# Patient Record
Sex: Female | Born: 1990 | Race: White | Hispanic: No | Marital: Single | Smoking: Current some day smoker
Health system: Southern US, Community
[De-identification: ages and names within clinical notes are randomized; demographics above are authoritative.]

## PROBLEM LIST (undated history)

## (undated) DIAGNOSIS — F209 Schizophrenia, unspecified: Secondary | ICD-10-CM

## (undated) DIAGNOSIS — F1910 Other psychoactive substance abuse, uncomplicated: Secondary | ICD-10-CM

## (undated) DIAGNOSIS — E039 Hypothyroidism, unspecified: Secondary | ICD-10-CM

---
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
## 2011-02-13 LAB — CBC
MCHC: 34.6 g/dL (ref 31.0–37.0)
MCV: 91.9 fL (ref 78.0–98.0)
Platelets: 224 10*3/uL (ref 150–400)
RBC: 4.35 MIL/uL (ref 3.80–5.70)
RDW: 12.8 % (ref 11.4–15.5)

## 2011-02-13 LAB — URINALYSIS, MICROSCOPIC ONLY
Nitrite: NEGATIVE
Protein, ur: NEGATIVE mg/dL
Urobilinogen, UA: 1 mg/dL (ref 0.0–1.0)

## 2011-02-13 LAB — DIFFERENTIAL
Eosinophils Relative: 3 % (ref 0–5)
Lymphocytes Relative: 26 % (ref 24–48)
Lymphs Abs: 2 10*3/uL (ref 1.1–4.8)

## 2011-02-13 LAB — PREGNANCY, URINE: Preg Test, Ur: NEGATIVE

## 2011-02-13 LAB — COMPREHENSIVE METABOLIC PANEL
AST: 23 U/L (ref 0–37)
CO2: 27 mEq/L (ref 19–32)
Calcium: 9.4 mg/dL (ref 8.4–10.5)
Creatinine, Ser: 0.68 mg/dL (ref 0.4–1.2)

## 2011-02-14 LAB — COMPREHENSIVE METABOLIC PANEL
ALT: 15 U/L (ref 0–35)
AST: 26 U/L (ref 0–37)
Albumin: 5.4 g/dL — ABNORMAL HIGH (ref 3.5–5.2)
Alkaline Phosphatase: 89 U/L (ref 47–119)
Glucose, Bld: 107 mg/dL — ABNORMAL HIGH (ref 70–99)
Potassium: 5.3 mEq/L — ABNORMAL HIGH (ref 3.5–5.1)
Sodium: 140 mEq/L (ref 135–145)
Total Protein: 9.4 g/dL — ABNORMAL HIGH (ref 6.0–8.3)

## 2011-02-14 LAB — GC/CHLAMYDIA PROBE AMP, URINE
Chlamydia, Swab/Urine, PCR: NEGATIVE
GC Probe Amp, Urine: NEGATIVE
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
## 2018-04-01 DIAGNOSIS — F1120 Opioid dependence, uncomplicated: Secondary | ICD-10-CM | POA: Insufficient documentation

## 2018-04-01 DIAGNOSIS — F23 Brief psychotic disorder: Secondary | ICD-10-CM | POA: Insufficient documentation

## 2018-04-01 DIAGNOSIS — F1220 Cannabis dependence, uncomplicated: Secondary | ICD-10-CM | POA: Diagnosis present

## 2018-04-01 DIAGNOSIS — F17210 Nicotine dependence, cigarettes, uncomplicated: Secondary | ICD-10-CM | POA: Diagnosis present

## 2018-04-01 DIAGNOSIS — F1320 Sedative, hypnotic or anxiolytic dependence, uncomplicated: Secondary | ICD-10-CM | POA: Insufficient documentation

## 2020-01-09 DIAGNOSIS — S064X0A Epidural hemorrhage without loss of consciousness, initial encounter: Secondary | ICD-10-CM | POA: Insufficient documentation

## 2020-01-09 DIAGNOSIS — S064XAA Epidural hemorrhage with loss of consciousness status unknown, initial encounter: Secondary | ICD-10-CM | POA: Insufficient documentation

## 2020-01-09 DIAGNOSIS — S0219XA Other fracture of base of skull, initial encounter for closed fracture: Secondary | ICD-10-CM | POA: Insufficient documentation

## 2020-01-09 DIAGNOSIS — IMO0002 Reserved for concepts with insufficient information to code with codable children: Secondary | ICD-10-CM | POA: Insufficient documentation

## 2020-12-30 DIAGNOSIS — F1520 Other stimulant dependence, uncomplicated: Secondary | ICD-10-CM | POA: Insufficient documentation

## 2021-01-01 DIAGNOSIS — R748 Abnormal levels of other serum enzymes: Secondary | ICD-10-CM | POA: Insufficient documentation

## 2021-04-16 ENCOUNTER — Emergency Department (HOSPITAL_COMMUNITY)
Admission: EM | Admit: 2021-04-16 | Discharge: 2021-04-16 | Disposition: A | Discharge status: Home / Self Care | Payer: Medicaid Other | Attending: Emergency Medicine | Admitting: Emergency Medicine

## 2021-04-16 ENCOUNTER — Emergency Department (HOSPITAL_COMMUNITY): Payer: Medicaid Other

## 2021-04-16 ENCOUNTER — Other Ambulatory Visit: Payer: Self-pay

## 2021-04-16 ENCOUNTER — Encounter (HOSPITAL_COMMUNITY): Payer: Self-pay | Admitting: Emergency Medicine

## 2021-04-16 DIAGNOSIS — S91104A Unspecified open wound of right lesser toe(s) without damage to nail, initial encounter: Secondary | ICD-10-CM | POA: Insufficient documentation

## 2021-04-16 DIAGNOSIS — W228XXA Striking against or struck by other objects, initial encounter: Secondary | ICD-10-CM | POA: Insufficient documentation

## 2021-04-16 DIAGNOSIS — S91109A Unspecified open wound of unspecified toe(s) without damage to nail, initial encounter: Secondary | ICD-10-CM

## 2021-04-16 MED ORDER — DOXYCYCLINE HYCLATE 100 MG PO TABS: 100.0000 mg | ORAL_TABLET | Freq: Two times a day (BID) | ORAL | 0 refills | Status: AC

## 2021-04-16 NOTE — ED Provider Notes (Signed)
[facility] EMERGENCY DEPARTMENT Provider Note   CSN: [id number] Arrival date & time: 04/16/21  1638     History Chief Complaint  Patient presents with   Toe Pain    Barbara Gordon is a 30 y.o. female.   Toe Pain   Patient presented to the ED for evaluation of a toe wound.  Patient states she scraped her toe on something about a week ago.  It was the small toe on her right foot.  Patient states since that time she has noticed some increased pain and drainage.  She has been applying antibiotic ointment and peroxide to the wound.  She denies any fevers or chills.  History reviewed. No pertinent past medical history.  There are no problems to display for this patient.   History reviewed. No pertinent surgical history.   OB History   No obstetric history on file.     History reviewed. No pertinent family history.     Home Medications Prior to Admission medications   Medication Sig Start Date End Date Taking? Authorizing Provider  doxycycline (VIBRA-TABS) 100 MG tablet Take 1 tablet (100 mg total) by mouth 2 (two) times daily for 7 days. 04/16/21 04/23/21 Yes Linwood Dibbles, MD    Allergies    Patient has no known allergies.  Review of Systems   Review of Systems  All other systems reviewed and are negative.  Physical Exam Updated Vital Signs BP (!) 136/95   Pulse 97   Temp 98.6 F (37 C) (Oral)   Resp 16   Ht 1.549 m (5\' 1" )   Wt 47.6 kg   LMP 04/10/2021   SpO2 99%   BMI 19.84 kg/m   Physical Exam Vitals and nursing note reviewed.  Constitutional:      General: She is not in acute distress.    Appearance: She is well-developed.  HENT:     Head: Normocephalic and atraumatic.     Right Ear: External ear normal.     Left Ear: External ear normal.  Eyes:     General: No scleral icterus.       Right eye: No discharge.        Left eye: No discharge.     Conjunctiva/sclera: Conjunctivae normal.  Neck:     Trachea: No tracheal deviation.   Cardiovascular:     Rate and Rhythm: Normal rate.  Pulmonary:     Effort: Pulmonary effort is normal. No respiratory distress.     Breath sounds: No stridor.  Abdominal:     General: There is no distension.  Musculoskeletal:        General: No swelling or deformity.     Cervical back: Neck supple.  Skin:    General: Skin is warm and dry.     Findings: Erythema present. No rash.     Comments: Abrasion type wound noted to the right fifth toe, mild erythema, no purulent drainage, no lymphangitic streaking, tenderness palpation around the wound, no ecchymoses  Neurological:     Mental Status: She is alert.     Cranial Nerves: Cranial nerve deficit: no gross deficits.    ED Results / Procedures / Treatments   Labs (all labs ordered are listed, but only abnormal results are displayed) Labs Reviewed - No data to display  EKG None  Radiology No results found.  Procedures Procedures   Medications Ordered in ED Medications - No data to display  ED Course  I have reviewed the triage vital signs  and the nursing notes.  Pertinent labs & imaging results that were available during my care of the patient were reviewed by me and considered in my medical decision making (see chart for details).    MDM Rules/Calculators/A&P                          Patient does have a wound on her right fifth toe.  She does not have any lymphangitic streaking but does have tenderness around the wound.  We will go ahead and start her on a course of antibiotics for possible early cellulitis associated with her toe injury.  No ecchymoses or deformity to suggest fracture. Final Clinical Impression(s) / ED Diagnoses Final diagnoses:  Open toe wound, initial encounter    Rx / DC Orders ED Discharge Orders          Ordered    doxycycline (VIBRA-TABS) 100 MG tablet  2 times daily        04/16/21 Carlyn Reichert, MD 04/16/21 1801

## 2021-04-16 NOTE — ED Triage Notes (Signed)
Pt arrives POV for eval of R pinky toe pain s/p striking it 1 week ago. Small sore present, concerned for infection. No streaking noted

## 2021-04-16 NOTE — Discharge Instructions (Addendum)
Continue to apply antibiotic ointment to the wound.  Take the antibiotics as prescribed.  Take over-the-counter as needed for pain

## 2021-04-16 NOTE — ED Provider Notes (Signed)
Emergency Medicine Provider Triage Evaluation Note  Barbara Gordon , a 30 y.o. female  was evaluated in triage. Pt complains of pain to right 5th toe. Hit one week ago. Now with wound to toe. Feel like it is swollen and possible infected. No hx of DM.  Review of Systems  Positive: Right 5th toe pain, redness Negative: Fever, chills  Physical Exam  There were no vitals taken for this visit. Gen:   Awake, no distress   Resp:  Normal effort  MSK:   Moves extremities without difficult, diffuse tenderness to pinky toe on right, wound to toe without drainage Other:    Medical Decision Making  Medically screening exam initiated at 4:50 PM.  Appropriate orders placed.  Yemariam Sawchuk was informed that the remainder of the evaluation will be completed by another provider, this initial triage assessment does not replace that evaluation, and the importance of remaining in the ED until their evaluation is complete.  5th digit pain and wound   Jaquae Rieves A, PA-C 04/16/21 1653    Linwood Dibbles, MD 04/16/21 2324

## 2021-04-27 DIAGNOSIS — F15922 Other stimulant use, unspecified with intoxication with perceptual disturbance: Secondary | ICD-10-CM | POA: Insufficient documentation

## 2021-04-27 DIAGNOSIS — F1994 Other psychoactive substance use, unspecified with psychoactive substance-induced mood disorder: Secondary | ICD-10-CM | POA: Insufficient documentation

## 2021-04-28 DIAGNOSIS — F1420 Cocaine dependence, uncomplicated: Secondary | ICD-10-CM | POA: Insufficient documentation

## 2021-04-29 DIAGNOSIS — Z765 Malingerer [conscious simulation]: Secondary | ICD-10-CM | POA: Insufficient documentation

## 2021-05-22 ENCOUNTER — Emergency Department (HOSPITAL_COMMUNITY)
Admission: EM | Admit: 2021-05-22 | Discharge: 2021-05-24 | Disposition: A | Discharge status: Home / Self Care | Payer: Self-pay | Attending: Emergency Medicine | Admitting: Emergency Medicine

## 2021-05-22 ENCOUNTER — Encounter (HOSPITAL_COMMUNITY): Payer: Self-pay | Admitting: Emergency Medicine

## 2021-05-22 ENCOUNTER — Emergency Department (HOSPITAL_COMMUNITY): Payer: Self-pay

## 2021-05-22 ENCOUNTER — Other Ambulatory Visit: Payer: Self-pay

## 2021-05-22 DIAGNOSIS — F1999 Other psychoactive substance use, unspecified with unspecified psychoactive substance-induced disorder: Secondary | ICD-10-CM | POA: Diagnosis present

## 2021-05-22 DIAGNOSIS — R4182 Altered mental status, unspecified: Secondary | ICD-10-CM | POA: Insufficient documentation

## 2021-05-22 DIAGNOSIS — Y900 Blood alcohol level of less than 20 mg/100 ml: Secondary | ICD-10-CM | POA: Insufficient documentation

## 2021-05-22 DIAGNOSIS — F1924 Other psychoactive substance dependence with psychoactive substance-induced mood disorder: Secondary | ICD-10-CM | POA: Insufficient documentation

## 2021-05-22 DIAGNOSIS — F1910 Other psychoactive substance abuse, uncomplicated: Secondary | ICD-10-CM | POA: Diagnosis present

## 2021-05-22 DIAGNOSIS — U071 COVID-19: Secondary | ICD-10-CM | POA: Insufficient documentation

## 2021-05-22 LAB — CBC WITH DIFFERENTIAL/PLATELET
Abs Immature Granulocytes: 0.01 10*3/uL (ref 0.00–0.07)
Basophils Absolute: 0 10*3/uL (ref 0.0–0.1)
Basophils Relative: 0 %
Eosinophils Absolute: 0 10*3/uL (ref 0.0–0.5)
Eosinophils Relative: 0 %
HCT: 41.9 % (ref 36.0–46.0)
Hemoglobin: 14.5 g/dL (ref 12.0–15.0)
Immature Granulocytes: 0 %
Lymphocytes Relative: 37 %
Lymphs Abs: 1.3 10*3/uL (ref 0.7–4.0)
MCH: 31.8 pg (ref 26.0–34.0)
MCHC: 34.6 g/dL (ref 30.0–36.0)
MCV: 91.9 fL (ref 80.0–100.0)
Monocytes Absolute: 0.4 10*3/uL (ref 0.1–1.0)
Monocytes Relative: 11 %
Neutro Abs: 1.8 10*3/uL (ref 1.7–7.7)
Neutrophils Relative %: 52 %
Platelets: 227 10*3/uL (ref 150–400)
RBC: 4.56 MIL/uL (ref 3.87–5.11)
RDW: 12.7 % (ref 11.5–15.5)
WBC: 3.5 10*3/uL — ABNORMAL LOW (ref 4.0–10.5)
nRBC: 0 % (ref 0.0–0.2)

## 2021-05-22 LAB — COMPREHENSIVE METABOLIC PANEL
ALT: 30 U/L (ref 0–44)
AST: 39 U/L (ref 15–41)
Albumin: 4.5 g/dL (ref 3.5–5.0)
Alkaline Phosphatase: 50 U/L (ref 38–126)
Anion gap: 9 (ref 5–15)
BUN: 16 mg/dL (ref 6–20)
CO2: 27 mmol/L (ref 22–32)
Calcium: 9.8 mg/dL (ref 8.9–10.3)
Chloride: 99 mmol/L (ref 98–111)
Creatinine, Ser: 0.86 mg/dL (ref 0.44–1.00)
GFR, Estimated: >60 mL/min (ref >60)
Glucose, Bld: 120 mg/dL — ABNORMAL HIGH (ref 70–99)
Potassium: 4.1 mmol/L (ref 3.5–5.1)
Sodium: 135 mmol/L (ref 135–145)
Total Bilirubin: 0.7 mg/dL (ref 0.3–1.2)
Total Protein: 8.3 g/dL — ABNORMAL HIGH (ref 6.5–8.1)

## 2021-05-22 LAB — ETHANOL: Alcohol, Ethyl (B): <10 mg/dL (ref <10)

## 2021-05-22 LAB — RAPID URINE DRUG SCREEN, HOSP PERFORMED
Amphetamines: NOT DETECTED
Barbiturates: NOT DETECTED
Benzodiazepines: NOT DETECTED
Cocaine: NOT DETECTED
Opiates: NOT DETECTED
Tetrahydrocannabinol: POSITIVE — AB

## 2021-05-22 LAB — RESP PANEL BY RT-PCR (FLU A&B, COVID) ARPGX2
Influenza A by PCR: NEGATIVE
Influenza B by PCR: NEGATIVE
SARS Coronavirus 2 by RT PCR: POSITIVE — AB

## 2021-05-22 LAB — PREGNANCY, URINE: Preg Test, Ur: NEGATIVE

## 2021-05-22 LAB — ACETAMINOPHEN LEVEL: Acetaminophen (Tylenol), Serum: <10 ug/mL — ABNORMAL LOW (ref 10–30)

## 2021-05-22 LAB — SALICYLATE LEVEL: Salicylate Lvl: <7 mg/dL — ABNORMAL LOW (ref 7.0–30.0)

## 2021-05-22 MED ORDER — ACETAMINOPHEN 325 MG PO TABS: 650.0000 mg | ORAL_TABLET | ORAL | Status: DC | PRN

## 2021-05-22 MED ORDER — NICOTINE 21 MG/24HR TD PT24
21.0000 mg | MEDICATED_PATCH | Freq: Every day | TRANSDERMAL | Status: DC
  Administered 2021-05-24: 21 mg via TRANSDERMAL
  Filled 2021-05-22: qty 1

## 2021-05-22 NOTE — ED Notes (Addendum)
Pt opening door of her room, repeatedly. She doesn't speak when she is at the doorway but stares. Affect is flat. Pt does not shut the door when asked, and staff have to shut the door. NT and I encouraged pt to stay in the room. I explained to the patient where she is located, that she is not herself, we want to help her get well, and she is positive for COVID-19. She expressed understanding by nodding her head yes. While speaking with the pt, she was looking around the room with a blank stare. She requested dinner, so I gave her a [place] sandwich to eat until dinner arrives. Pt is redirectable.

## 2021-05-22 NOTE — ED Triage Notes (Signed)
Patient was brought in via EMS previously for altered mental status. Patient seems hesitant when answering questions.  Reports "doesn't understand exactly why she is here". Patient denies drug use to the PA at the bedside.  Patient denies nausea, vomiting or any type of pain.

## 2021-05-22 NOTE — ED Notes (Signed)
Patient changed into burgundy scrubs, belongings labeled and placed into cabinet at 9-12 nurse's station. Pt belongings include: black sandals, gray t-shirt, pink shorts, pink bag, folder, pink lighter, black cell phone, and blue and white wallet. Witnessed by [employer].

## 2021-05-22 NOTE — ED Provider Notes (Signed)
[place] DEPT Provider Note   CSN: [id number] Arrival date & time: 05/22/21  1035     History Chief Complaint  Patient presents with   Altered Mental Status    Barbara Gordon is a 30 y.o. female with a past medical history significant for amphetamine abuse, cocaine use, anxiety, polysubstance abuse, chronic homelessness, and borderline personality who presents to the ED via EMS under IVC by [employer] due to confusion. During initial evaluation, patient is unsure why she is here; however she is alert and oriented. Per [employer], they picked patient up at Larry's house who is a 29 year old female who patient met with on facebook and is now living with him. Per [employer], patient is unable to return to [employer]. Patient denies any recent drug or alcohol use. Denies SI, HI, and auditory/visual hallucinations. Chart reviewed. Patient was evaluated at [facility] ED on 7/12 for similar situation where patient was found wandering the roadway. Patient admits to tobacco use. Denies any physical complaints.  Denies nausea, vomiting, diarrhea, headache, chest pain, abdominal pain, and shortness of breath.  History obtained from patient and past medical records. No interpreter used during encounter.      History reviewed. No pertinent past medical history.  There are no problems to display for this patient.   History reviewed. No pertinent surgical history.   OB History   No obstetric history on file.     History reviewed. No pertinent family history.     Home Medications Prior to Admission medications   Not on File    Allergies    Patient has no known allergies.  Review of Systems   Review of Systems  Constitutional:  Negative for chills and fever.  HENT:  Negative for rhinorrhea and sore throat.   Eyes:  Negative for visual disturbance.  Respiratory:  Negative for shortness of breath.   Cardiovascular:  Negative for chest pain and palpitations.  Gastrointestinal:   Negative for abdominal pain.  Genitourinary:  Negative for dysuria.  Musculoskeletal:  Negative for myalgias.  Skin:  Negative for color change and rash.  Neurological:  Negative for dizziness and light-headedness.  Psychiatric/Behavioral:  Positive for confusion. Negative for suicidal ideas.    Physical Exam Updated Vital Signs BP (!) 142/95 (BP Location: Left Arm)   Pulse 84   Temp 97.7 F (36.5 C) (Oral)   Resp 20   Ht $R'5\' 1"'qO$  (1.549 m)   Wt 41 kg   SpO2 100%   BMI 17.06 kg/m   Physical Exam Vitals and nursing note reviewed.  Constitutional:      General: She is not in acute distress.    Appearance: She is not ill-appearing.  HENT:     Head: Normocephalic.  Eyes:     Pupils: Pupils are equal, round, and reactive to light.  Cardiovascular:     Rate and Rhythm: Normal rate and regular rhythm.     Pulses: Normal pulses.     Heart sounds: Normal heart sounds. No murmur heard.   No friction rub. No gallop.  Pulmonary:     Effort: Pulmonary effort is normal.     Breath sounds: Normal breath sounds.  Abdominal:     General: Abdomen is flat. There is no distension.     Palpations: Abdomen is soft.     Tenderness: There is no abdominal tenderness. There is no guarding or rebound.  Musculoskeletal:        General: Normal range of motion.  Cervical back: Neck supple.  Skin:    General: Skin is warm and dry.  Neurological:     General: No focal deficit present.     Mental Status: She is alert and oriented to person, place, and time.     Comments: AAOx4.  Speech is clear, able to follow commands, but slow to respond CN III-XII intact Normal strength in upper and lower extremities bilaterally including dorsiflexion and plantar flexion, strong and equal grip strength Sensation grossly intact throughout Moves extremities without ataxia, coordination intact No pronator drift Ambulates without difficulty   Psychiatric:        Mood and Affect: Mood normal.         Behavior: Behavior normal.    ED Results / Procedures / Treatments   Labs (all labs ordered are listed, but only abnormal results are displayed) Labs Reviewed  RESP PANEL BY RT-PCR (FLU A&B, COVID) ARPGX2 - Abnormal; Notable for the following components:      Result Value   SARS Coronavirus 2 by RT PCR POSITIVE (*)    All other components within normal limits  COMPREHENSIVE METABOLIC PANEL - Abnormal; Notable for the following components:   Glucose, Bld 120 (*)    Total Protein 8.3 (*)    All other components within normal limits  RAPID URINE DRUG SCREEN, HOSP PERFORMED - Abnormal; Notable for the following components:   Tetrahydrocannabinol POSITIVE (*)    All other components within normal limits  CBC WITH DIFFERENTIAL/PLATELET - Abnormal; Notable for the following components:   WBC 3.5 (*)    All other components within normal limits  ACETAMINOPHEN LEVEL - Abnormal; Notable for the following components:   Acetaminophen (Tylenol), Serum <10 (*)    All other components within normal limits  SALICYLATE LEVEL - Abnormal; Notable for the following components:   Salicylate Lvl <7.0 (*)    All other components within normal limits  ETHANOL  PREGNANCY, URINE  I-STAT BETA HCG BLOOD, ED ([facility], [facility], [facility] ONLY)    EKG EKG Interpretation  Date/Time:  Friday May 22 2021 13:15:28 EDT Ventricular Rate:  86 PR Interval:  138 QRS Duration: 80 QT Interval:  354 QTC Calculation: 423 R Axis:   92 Text Interpretation: Normal sinus rhythm Rightward axis Borderline ECG Confirmed by Lacretia Leigh ([id number]) on 05/22/2021 2:17:09 PM  Radiology No results found.  Procedures Procedures   Medications Ordered in ED Medications  acetaminophen (TYLENOL) tablet 650 mg (has no administration in time range)  nicotine (NICODERM CQ - dosed in mg/24 hours) patch 21 mg (21 mg Transdermal Patient Refused/Not Given 05/22/21 2128)    ED Course  I have reviewed the triage vital signs and the nursing  notes.  Pertinent labs & imaging results that were available during my care of the patient were reviewed by me and considered in my medical decision making (see chart for details).  Clinical Course as of 05/22/21 2158  Fri May 22, 2021  1315 Tetrahydrocannabinol(!): POSITIVE [CA]  1501 SARS Coronavirus 2 by RT PCR(!): POSITIVE [CA]    Clinical Course User Index [CA] Karie Kirks   MDM Rules/Calculators/A&P                         30 year old female presents to the ED via EMS under IVC due to AMS. Patient seen at [facility] ED on 7/12 for the same. Patient has a history of polysubstance abuse. Denies SI, HI, and auditory/visual hallucinations.  Upon arrival, stable  vitals.  Patient in no acute distress.  Patient is alert and oriented however, she is slow to respond to questions.  Very difficult to obtain HPI.  [employer] at bedside notes that patient has been living with a 44 year old that she met on facebook a few months ago. Normal neurological exam. First examination performed. Medical clearance labs ordered.  CBC significant for mild leukopenia at 3.5, but otherwise unremarkable.  Normal hemoglobin.  Ethanol, acetaminophen, salicylate level normal.  CMP significant for hyperglycemia 120.  No anion gap.  Normal renal function.  No major electrolyte derangements.  UDS positive for THC.  Patient has been medically cleared for TTS evaluation.  The patient has been placed in psychiatric observation due to the need to provide a safe environment for the patient while obtaining psychiatric consultation and evaluation, as well as ongoing medical and medication management to treat the patient's condition.  The patient has been placed under full IVC at this time.  Final Clinical Impression(s) / ED Diagnoses Final diagnoses:  Altered mental status, unspecified altered mental status type    Rx / DC Orders ED Discharge Orders     None        Karie Kirks 05/22/21 2158     Lacretia Leigh, MD 05/23/21 [phone]

## 2021-05-22 NOTE — ED Notes (Signed)
Report given to TU nurse and patient is ambulated to the unit without problems

## 2021-05-22 NOTE — ED Notes (Signed)
Pt wandering around in triage, pt directed back to triage 4 multiple times, pt states she wants to leave but does not know how.

## 2021-05-22 NOTE — ED Notes (Addendum)
Patient was IVC'd by [facility] Dept.   patient had been wandering around outside. [facility] dept tried to get patient to go home or come inside to the ED earlier after she left triage, but patient would not.

## 2021-05-22 NOTE — BH Assessment (Addendum)
Comprehensive Clinical Assessment (CCA) Note  05/22/2021 Barbara Gordon [id number] DISPOSITION: Leevy-Johnson NP recommends a inpatient admission to assist with stabilization.       [place] ED from 05/22/2021 in [place] DEPT ED from 04/16/2021 in [place] No Risk No Risk      The patient demonstrates the following risk factors for suicide: Chronic risk factors for suicide include: N/A. Acute risk factors for suicide include: N/A. Protective factors for this patient include: positive social support. Considering these factors, the overall suicide risk at this point appears to be low. Patient is not appropriate for outpatient follow up.   Patient is a 30 year old female that presents with AMS. Patient was with IVC that was initiated by [facility] stating respondent has no ideal where she is, who she is or why she is presenting this date. Patient has a past history per notes of amphetamine, cocaine and THC use with UDS positive for THC this date. Patient's history is limited per chart review. Patient denies any S/I, H/I or AVH this date although this writer is uncertain if patient is comprehending the content of this writer's questions. LEO who was present stated someone in the community contacted them to respond to a residence (LEO was uncertain if patient resided there) and due to being impaired was [facility] and brought in for evaluation. History is limited per chart review. Patient does answer "no, no" to questions in reference to S/I, H/I or AVH. Patient is observed to just be staring at this writer and will not respond to questions other than above. Information to complete assessment was obtained from admission notes and chart review.  Aberman PA writes on arrival:  Barbara Gordon is a 30 y.o. female with a past medical history significant for amphetamine abuse, cocaine use, anxiety, polysubstance abuse, chronic  homelessness, and borderline personality who presents to the ED via EMS under IVC by [employer] due to confusion. During initial evaluation, patient is unsure why she is here; however she is alert and oriented. Per [employer], they picked patient up at Larry's house who is a 30 year old female who patient met with on facebook and is now living with him. Per [employer], patient is unable to return to [employer]. Patient denies any recent drug or alcohol use. Denies SI, HI, and auditory/visual hallucinations. Chart reviewed. Patient was evaluated at [facility] ED on 7/12 for similar situation where patient was found wandering the roadway. Patient admits to tobacco use. Denies any physical complaints.  Denies nausea, vomiting, diarrhea, headache, chest pain, abdominal pain, and shortness of breath.  Patient will not respond to orientation questions. Patient presents as bizarre and will not participate in the assessment process. Patient's memory is impaired with thoughts disorganized. It is unclear if patient is responding to internal stimuli.      Chief Complaint:  Chief Complaint  Patient presents with   Altered Mental Status   Visit Diagnosis: Altered mental status    CCA Screening, Triage and Referral (STR)  Patient Reported Information How did you hear about [name]? -- (LEO brought patient in)  What Is the Reason for Your Visit/Call Today? IVC altered mental state  How Long Has This Been Causing You Problems? <Week  What Do You Feel Would Help You the Most Today? -- (UTA)   Have You Recently Had Any Thoughts About Hurting Yourself? No  Are You Planning to Commit Suicide/Harm Yourself At This time? No  Have you Recently Had Thoughts About [place]? No data recorded Are You Planning to Harm Someone at This Time? No  Explanation: No data recorded  Have You Used Any Alcohol or Drugs in the Past 24 Hours? Yes  How Long Ago Did You Use Drugs or Alcohol? No data recorded What Did You Use and How  Much? Positive for THC per UDS   Do You Currently Have a Therapist/Psychiatrist? No  Name of Therapist/Psychiatrist: No data recorded  Have You Been Recently Discharged From Any Office Practice or Programs? No  Explanation of Discharge From Practice/Program: No data recorded    CCA Screening Triage Referral Assessment Type of Contact: Face-to-Face  Telemedicine Service Delivery:   Is this Initial or Reassessment? No data recorded Date Telepsych consult ordered in [facility]:  No data recorded Time Telepsych consult ordered in [facility]:  No data recorded Location of Assessment: [facility]  Provider Location: -- ([facility])   Collateral Involvement: None at this time   Does Patient Have a [place]? No data recorded Name and Contact of Legal Guardian: No data recorded If Minor and Not Living with Parent(s), Who has Custody? NA  Is CPS involved or ever been involved? Never  Is APS involved or ever been involved? Never   Patient Determined To Be At Risk for Harm To Self or Others Based on Review of Patient Reported Information or Presenting Complaint? No  Method: No data recorded Availability of Means: No data recorded Intent: No data recorded Notification Required: No data recorded Additional Information for Danger to Others Potential: No data recorded Additional Comments for Danger to Others Potential: No data recorded Are There Guns or Other Weapons in Your Home? No data recorded Types of Guns/Weapons: No data recorded Are These Weapons Safely Secured?                            No data recorded Who Could Verify You Are Able To Have These Secured: No data recorded Do You Have any Outstanding Charges, Pending Court Dates, Parole/Probation? No data recorded Contacted To Inform of Risk of Harm To Self or Others: Other: Comment (NA)    Does Patient Present under Involuntary Commitment? Yes  IVC Papers Initial File Date: 05/22/21   [place] of Residence:  [place]   Patient Currently Receiving the Following Services: Not Receiving Services   Determination of Need: Emergent (2 hours)   Options For Referral: Outpatient Therapy     CCA Biopsychosocial Patient Reported Schizophrenia/Schizoaffective Diagnosis in Past: No   Strengths: UTA   Mental Health Symptoms Depression:   -- (UTA)   Duration of Depressive symptoms:    Mania:   -- (UTA)   Anxiety:    -- (UTA)   Psychosis:   -- (UTA)   Duration of Psychotic symptoms:    Trauma:   -- (UTA)   Obsessions:   -- (UTA)   Compulsions:   -- (UTA)   Inattention:   -- (UTA)   Hyperactivity/Impulsivity:   -- (UTA)   Oppositional/Defiant Behaviors:   -- (UTA)   Emotional Irregularity:   -- (UTA)   Other Mood/Personality Symptoms:   UTA    Mental Status Exam Appearance and self-care  Stature:   Average   Weight:   Average weight   Clothing:   Disheveled   Grooming:   Bizarre   Cosmetic use:   None   Posture/gait:   Bizarre   Motor activity:  Agitated   Sensorium  Attention:   Confused   Concentration:   Anxiety interferes   Orientation:   -- (UTA)   Recall/memory:   -- (UTA)   Affect and Mood  Affect:   Anxious   Mood:   Anxious   Relating  Eye contact:   Fleeting   Facial expression:   Anxious   Attitude toward examiner:   Uninterested   Thought and Language  Speech flow:  Blocked   Thought content:   Suspicious   Preoccupation:   None   Hallucinations:   -- (UTA)   Organization:  No data recorded  [employer] of Knowledge:   -- Special educational needs teacher)   Intelligence:   -- Special educational needs teacher)   Abstraction:   Abstract   Judgement:   Impaired   Reality Testing:   Distorted   Insight:   -- Special educational needs teacher)   Decision Making:   Confused   Social Functioning  Social Maturity:   -- Special educational needs teacher)   Social Judgement:   -- Special educational needs teacher)   Stress  Stressors:   -- Special educational needs teacher)   Coping Ability:   -- Special educational needs teacher)   Skill Deficits:   --  Special educational needs teacher)   Supports:   -- Special educational needs teacher)     Religion: Religion/Spirituality Are You A Religious Person?:  (UTA) How Might This Affect Treatment?: UTA  Leisure/Recreation: Leisure / Recreation Do You Have Hobbies?:  (UTA)  Exercise/Diet: Exercise/Diet Do You Exercise?:  (UTA) Have You Gained or Lost A Significant Amount of Weight in the Past Six Months?:  (UTA) Do You Follow a Special Diet?:  (UTA) Do You Have Any Trouble Sleeping?:  (UTA)   CCA Employment/Education Employment/Work Situation: Employment / Work Situation Employment Situation:  Special educational needs teacher) Patient's Job has Been Impacted by Current Illness:  (UTA) Has Patient ever Been in the [employer]?:  (UTA)  Education: Education Is Patient Currently Attending School?:  (UTA) Last Grade Completed:  (UTA) Did You Attend College?:  (UTA) Did You Have An Individualized Education Program (IIEP):  (UTA) Did You Have Any Difficulty At School?:  (UTA) Patient's Education Has Been Impacted by Current Illness:  (UTA)   CCA Family/Childhood History Family and Relationship History: Family history Marital status:  (UTA) Does patient have children?:  (UTA)  Childhood History:  Childhood History By whom was/is the patient raised?:  (UTA) Did patient suffer any verbal/emotional/physical/sexual abuse as a child?:  (UTA) Did patient suffer from severe childhood neglect?:  (UTA) Has patient ever been sexually abused/assaulted/raped as an adolescent or adult?:  (UTA) Was the patient ever a victim of a crime or a disaster?:  (UTA) Witnessed domestic violence?:  (UTA) Has patient been affected by domestic violence as an adult?:  Special educational needs teacher)  Child/Adolescent Assessment:     CCA Substance Use Alcohol/Drug Use: Alcohol / Drug Use Pain Medications: See MAR Prescriptions: See MAR Over the Counter: See MAR History of alcohol / drug use?: Yes Longest period of sobriety (when/how long): UTA Negative Consequences of Use:  (UTA) Withdrawal Symptoms:   (UTA) Substance #1 Name of Substance 1: THC per UDS 1 - Age of First Use: UTA 1 - Amount (size/oz): UTA 1 - Frequency: UTA 1 - Duration: UTA 1 - Last Use / Amount: UTA 1 - Method of Aquiring: UTA 1- Route of Use: UTA                       ASAM's:  Six Dimensions of Multidimensional Assessment  Dimension 1:  Acute Intoxication and/or Withdrawal  Potential:      Dimension 2:  Biomedical Conditions and Complications:      Dimension 3:  Emotional, Behavioral, or Cognitive Conditions and Complications:     Dimension 4:  Readiness to Change:     Dimension 5:  Relapse, Continued use, or Continued Problem Potential:     Dimension 6:  Recovery/Living Environment:     ASAM Severity Score:    ASAM Recommended Level of Treatment:     Substance use Disorder (SUD)    Recommendations for Services/Supports/Treatments:    Discharge Disposition:    DSM5 Diagnoses: There are no problems to display for this patient.    Referrals to Alternative Service(s): Referred to Alternative Service(s):   Place:   Date:   Time:    Referred to Alternative Service(s):   Place:   Date:   Time:    Referred to Alternative Service(s):   Place:   Date:   Time:    Referred to Alternative Service(s):   Place:   Date:   Time:     Mamie Nick, LCAS

## 2021-05-22 NOTE — ED Triage Notes (Signed)
Per EMS- [facility] was called for a Public house manager. When they arrived the patient was confused and when female friend was near the patient the patient would not talk to EMS. Patient has a history of meth use. Patient was alert and oriented and was aware that she was going to the ED.   Patient currently wondering around in Triage and left out of the Triage area to the ED Lobby. Patient stated  she wanted to leave, but did not know how to leave. Patient opened the door and left.

## 2021-05-22 NOTE — ED Notes (Addendum)
Pt is crying in her room. She used the call bell in request dinner. Again, I explained the reason she is here and that we are trying to assist her. She wants to use her cell phone and have her things. I explained the no cell phone policy and offered her the use of the cordless phone. Pt is redirectable.

## 2021-05-22 NOTE — ED Provider Notes (Signed)
I provided a substantive portion of the care of this patient.  I personally performed the entirety of the medical decision making for this encounter.  30 year old female presents due to confusion after being brought here by [employer].  Patient denies SI or HI.  Will order labs here.  She does have a history of polysubstance abuse.  Suspect that is some of the etiology of her current symptoms.  We will follow-up   Lorre Nick, MD 05/22/21 1330

## 2021-05-23 MED ORDER — ZIPRASIDONE MESYLATE 20 MG IM SOLR: 20.0000 mg | INTRAMUSCULAR | Status: DC | PRN

## 2021-05-23 MED ORDER — RISPERIDONE 1 MG PO TBDP
1.0000 mg | ORAL_TABLET | Freq: Two times a day (BID) | ORAL | Status: DC
  Administered 2021-05-23 – 2021-05-24 (×3): 1 mg via ORAL
  Filled 2021-05-23 (×3): qty 1

## 2021-05-23 MED ORDER — LORAZEPAM 1 MG PO TABS: 1.0000 mg | ORAL_TABLET | ORAL | Status: DC | PRN

## 2021-05-23 MED ORDER — OLANZAPINE 5 MG PO TBDP: 5.0000 mg | ORAL_TABLET | Freq: Three times a day (TID) | ORAL | Status: DC | PRN

## 2021-05-23 NOTE — ED Notes (Signed)
Pt has been resting this shift, eats and takes meds with no issues. Pt is able to be redirected  when she attempts to come out of room.

## 2021-05-23 NOTE — Progress Notes (Signed)
Per Leroy Sea, patient meets criteria for inpatient treatment. There are no available or appropriate beds at [facility] today. CSW faxed referrals to the following facilities for review:  [facility] [place] [facility]  TTS will continue to seek bed placement.  Crissie Reese, MSW, LCSW-A, LCAS-A Phone: [phone] Disposition/TOC

## 2021-05-23 NOTE — BH Assessment (Signed)
This Probation officer met with patient this date to assess current mental health state. Patient contnues to be disorganized and cannot recall day, month or year. Patient did acknowledge that she is "in the hospital." Patient's recent memory is impaired and she has no recall of the events that transpired prior to arrival. Patient denies any S/I, H/I or AVH. This Probation officer attempts to gather history in reference to her SA issues and other mental health disorders although as stated above patient has no recall at this time. Patient is observed to be staring at this writer and will not respond when asked questions. Leevy-Johnson NP recommends a continued inpatient admission as placement is investigated.

## 2021-05-23 NOTE — ED Provider Notes (Signed)
Emergency Medicine Observation Re-evaluation Note  Barbara Gordon is a 30 y.o. female, seen on rounds today.  Pt initially presented to the ED for complaints of Altered Mental Status Currently, the patient is sitting in her room.  Calm and cooperative.  Physical Exam  BP 135/86 (BP Location: Left Arm)   Pulse 74   Temp 98 F (36.7 C) (Oral)   Resp 20   Ht 1.549 m (5\' 1" )   Wt 41 kg   SpO2 94%   BMI 17.06 kg/m  Physical Exam General: No acute distress Cardiac Lungs:  Psych: Appears somewhat anxious  ED Course / MDM  EKG:EKG Interpretation  Date/Time:  Friday May 22 2021 13:15:28 EDT Ventricular Rate:  86 PR Interval:  138 QRS Duration: 80 QT Interval:  354 QTC Calculation: 423 R Axis:   92 Text Interpretation: Normal sinus rhythm Rightward axis Borderline ECG Confirmed by [id number] (Lorre Nick) on 05/22/2021 2:17:09 PM  I have reviewed the labs performed to date as well as medications administered while in observation.  Recent changes in the last 24 hours include none.  Plan  Current plan is for reevaluation by psychiatry. Patient is under full IVC at this time.   05/24/2021, MD 05/23/21 1009

## 2021-05-23 NOTE — ED Notes (Signed)
Pt given apple juice. Pt declined sandwich and/or snack.

## 2021-05-23 NOTE — Consult Note (Signed)
Barbara Gordon is a 30 year old female who presented to [facility] via EMS after [facility] was called for a welfare check. Per chart review, upon arrival patient was found confused and disoriented with female friend who would not talk to EMS. Patient has past history of polysubstance abuse, substance induced disorder, borderline personality disorder. Patient presented to [facility] ED /05/19/21 with similar presentation after being found wandering in the road. UDS+ THC, BAL<10. SARS+. Currently under IVC.   Plan:   -Inpatient admission for further observation, stabilization, and  treatment   -Agitation Protocol initiated for any breakthrough agitation   -Risperidone 1 mg BID restarted for substance induced disorder

## 2021-05-24 DIAGNOSIS — F1910 Other psychoactive substance abuse, uncomplicated: Secondary | ICD-10-CM | POA: Diagnosis present

## 2021-05-24 DIAGNOSIS — F1999 Other psychoactive substance use, unspecified with unspecified psychoactive substance-induced disorder: Secondary | ICD-10-CM | POA: Diagnosis present

## 2021-05-24 NOTE — Discharge Instructions (Addendum)
Follow-up with your doctor as needed  [facility]-will provide timely access to mental health services for children and adolescents (4-17) and adults presenting in a mental health crisis. The program is designed for those who need urgent Behavioral Health or Substance Use treatment and are not experiencing a medical crisis that would typically require an emergency room visit.    [address] [place], [place] [address] Phone: [phone] [email or web address]   [facility] will also offer the following outpatient services: (Monday through Friday 8am-5pm)   Partial Hospitalization Program (PHP) Substance Abuse Intensive Outpatient Program (SA-IOP) Group Therapy Medication Management Peer Living Room   We also provide (24/7):    Assessments: Our mental health clinician and providers will conduct a focused mental health evaluation, assessing for immediate safety concerns and further mental health needs.   Referral: Our team will provide resources and help connect to community based mental health treatment, when indicated, including psychotherapy, psychiatry, and other specialized behavioral health or substance use disorder services (for those not already in treatment).   Transitional Care: Our team providers in person bridging and/or telphonic follow-up during the patient's transition to outpatient services.

## 2021-05-24 NOTE — ED Provider Notes (Signed)
Emergency Medicine Observation Re-evaluation Note  Barbara Gordon is a 30 y.o. female, seen on rounds today.  Pt initially presented to the ED for complaints of Altered Mental Status Currently, the patient is resting comfortably..  Physical Exam  BP 107/72 (BP Location: Left Arm)   Pulse 79   Temp 98.6 F (37 C) (Oral)   Resp 16   Ht 1.549 m (5\' 1" )   Wt 41 kg   SpO2 99%   BMI 17.06 kg/m  Physical Exam General: Calm and cooperative.  Psych: Not responding to internal stimuli  ED Course / MDM  EKG:EKG Interpretation  Date/Time:  Friday May 22 2021 13:15:28 EDT Ventricular Rate:  86 PR Interval:  138 QRS Duration: 80 QT Interval:  354 QTC Calculation: 423 R Axis:   92 Text Interpretation: Normal sinus rhythm Rightward axis Borderline ECG Confirmed by [id number] (Lorre Nick) on 05/22/2021 2:17:09 PM  I have reviewed the labs performed to date as well as medications administered while in observation.  Recent changes in the last 24 hours include more oriented..  Plan  Current plan is for evaluation by psychiatry with hopefully discharge. Patient is under full IVC at this time.   05/24/2021, MD 05/24/21 [id number]

## 2021-05-24 NOTE — Consult Note (Addendum)
[facility] Discharge  05/24/2021 3:59 PM Barbara Gordon  MRN:  [id number]  Method of visit?: Face to Face   Principal Problem: Substance-induced disorder [facility]) Discharge Diagnoses: Principal Problem:   Substance-induced disorder (HCC) Active Problems:   Polysubstance abuse (HCC)  Subjective:  She presents disheveled in appearance, alert and oriented to person, place, and partial time (month, year); unclear to situation. Patient states she doesn't remember details of what happened. She endorses chronic substance use and asks "are you going to take me to jail?". Provider provided reassurance that she was not under arrest and explained long-term effects of substance use, and discussed available treatment options; patient denies any substance use and declined any treatment for mental health or substance abuse at this time. Provider explained that resources for both mental health and substance abuse would be placed in her AVS for individual follow up in case she changes her mind. Per chart review patient was most recently seen at [facility] ED 05/22/21 with similar presentation where she was discharged with resources, 05/19/21  [facility] ED similar presentation after being found wandering down the highway. Of note she has a history of bipolar disorder, polysubstance use, and substance induced disorder.   Patient denies any suicidal or homicidal ideations, auditory or visual hallucinations, and is not actively psychotic or responding to any external/internal stimuli at this time. She is requesting assistance with transportation home.  Family member later showed up to transport patient home. Outpatient resources in AVS.   Total Time spent with patient: 20 minutes  Past Psychiatric History:   -Substance induced disorder  -polysubstance induced disorder  Past Medical History: History reviewed. No pertinent past medical history. History reviewed. No pertinent surgical history. Family History: History  reviewed. No pertinent family history. Family Psychiatric  History: not noted Social History:  Social History   Substance and Sexual Activity  Alcohol Use None     Social History   Substance and Sexual Activity  Drug Use Not on file    Social History   Socioeconomic History   Marital status: Single    Spouse name: Not on file   Number of children: Not on file   Years of education: Not on file   Highest education level: Not on file  Occupational History   Not on file  Tobacco Use   Smoking status: Not on file   Smokeless tobacco: Not on file  Substance and Sexual Activity   Alcohol use: Not on file   Drug use: Not on file   Sexual activity: Not on file  Other Topics Concern   Not on file  Social History Narrative   Not on file   Social Determinants of Health   Financial Resource Strain: Not on file  Food Insecurity: Not on file  Transportation Needs: Not on file  Physical Activity: Not on file  Stress: Not on file  Social Connections: Not on file    Tobacco Cessation:  N/A, patient does not currently use tobacco products  Current Medications: No current facility-administered medications for this encounter.   Current Outpatient Medications  Medication Sig Dispense Refill   risperiDONE (RISPERDAL) 1 MG tablet Take 1 mg by mouth 2 (two) times daily.     PTA Medications: (Not in a hospital admission)   Musculoskeletal: Strength & Muscle Tone: within normal limits Gait & Station: normal Patient leans: N/A  Psychiatric Specialty Exam:  Presentation  General Appearance:  Casual Eye Contact: Fair Speech: Clear and Coherent; Slow Speech Volume: Normal Handedness: No  data recorded  Mood and Affect  Mood: Euthymic Affect: Congruent  Thought Process  Thought Processes: Coherent Descriptions of Associations:Intact Orientation:Full (Time, Place and Person) Thought Content:Logical; WDL History of Schizophrenia/Schizoaffective  disorder:No  Duration of Psychotic Symptoms:No data recorded Hallucinations:Hallucinations: None Ideas of Reference:None Suicidal Thoughts:Suicidal Thoughts: No Homicidal Thoughts:Homicidal Thoughts: No  Sensorium  Memory: Immediate Fair; Recent Fair; Remote Fair Judgment: Fair Insight: Fair  Art therapist  Concentration: Fair Attention Span: Fair Recall: [employer] of Knowledge: Fair Language: Fair  Psychomotor Activity  Psychomotor Activity: Psychomotor Activity: Normal  Assets  Assets: Physical Health; Resilience; Housing  Sleep  Sleep: Sleep: Good   Physical Exam: Physical Exam Vitals and nursing note reviewed.  Constitutional:      General: She is not in acute distress.    Appearance: She is not ill-appearing, toxic-appearing or diaphoretic.  HENT:     Head: Normocephalic.     Nose: Nose normal.     Mouth/Throat:     Mouth: Mucous membranes are moist.  Cardiovascular:     Rate and Rhythm: Normal rate.     Pulses: Normal pulses.  Pulmonary:     Effort: Pulmonary effort is normal.  Abdominal:     General: Abdomen is flat.  Musculoskeletal:        General: Normal range of motion.     Cervical back: Normal range of motion.  Skin:    General: Skin is warm and dry.  Neurological:     Mental Status: She is alert and oriented to person, place, and time. Mental status is at baseline.  Psychiatric:        Attention and Perception: Attention and perception normal.        Mood and Affect: Mood normal. Affect is flat.        Speech: Speech normal.        Behavior: Behavior normal. Behavior is cooperative.        Thought Content: Thought content is not paranoid or delusional. Thought content does not include homicidal or suicidal ideation. Thought content does not include homicidal or suicidal plan.   Review of Systems  Psychiatric/Behavioral:  Positive for substance abuse. Negative for hallucinations and suicidal ideas. The patient is not  nervous/anxious and does not have insomnia.   All other systems reviewed and are negative. Blood pressure 107/72, pulse 79, temperature 98.6 F (37 C), temperature source Oral, resp. rate 16, height 5\' 1"  (1.549 m), weight 41 kg, SpO2 99 %. Body mass index is 17.06 kg/m.   Demographic Factors:  Adolescent or young adult, Caucasian, and Low socioeconomic status  Loss Factors: NA  Historical Factors: Personal history of substance abuse  Risk Reduction Factors:   Living with another person, especially a relative  Continued Clinical Symptoms:  Alcohol/Substance Abuse/Dependencies  Cognitive Features That Contribute To Risk:  None    Suicide Risk:  Minimal: No identifiable suicidal ideation.  Patients presenting with no risk factors but with morbid ruminations; may be classified as minimal risk based on the severity of the depressive symptoms. Patient denies any active suicidal or homicidal ideations, intent, or plan.     Plan Of Care/Follow-up recommendations:  Other:  Follow up with outpatient services to address psychiatric and substance abuse needs. Resources for [facility] and [facility] provided.   Disposition: Discharge patient home with individual follow up to outpatient resources.  [facility], NP 05/24/2021, 3:59 PM

## 2021-05-24 NOTE — Progress Notes (Signed)
CSW provided the following resources for the patient to utilize upon discharge:  [facility]-will provide timely access to mental health services for children and adolescents (4-17) and adults presenting in a mental health crisis. The program is designed for those who need urgent Behavioral Health or Substance Use treatment and are not experiencing a medical crisis that would typically require an emergency room visit.    [address] [place], [place] [address] Phone: [phone] [email or web address]   The [facility] will also offer the following outpatient services: (Monday through Friday 8am-5pm)   Partial Hospitalization Program (PHP) Substance Abuse Intensive Outpatient Program (SA-IOP) Group Therapy Medication Management Peer Living Room   We also provide (24/7):    Assessments: Our mental health clinician and providers will conduct a focused mental health evaluation, assessing for immediate safety concerns and further mental health needs.   Referral: Our team will provide resources and help connect to community based mental health treatment, when indicated, including psychotherapy, psychiatry, and other specialized behavioral health or substance use disorder services (for those not already in treatment).   Transitional Care: Our team providers in person bridging and/or telphonic follow-up during the patient's transition to outpatient services.    Tor Tsuda, MSW, LCSW-A, LCAS-A Phone: [phone] Disposition/TOC  

## 2021-06-04 DIAGNOSIS — F19959 Other psychoactive substance use, unspecified with psychoactive substance-induced psychotic disorder, unspecified: Secondary | ICD-10-CM | POA: Insufficient documentation

## 2021-09-30 DIAGNOSIS — Z9189 Other specified personal risk factors, not elsewhere classified: Secondary | ICD-10-CM | POA: Insufficient documentation

## 2021-10-08 ENCOUNTER — Encounter (HOSPITAL_COMMUNITY): Payer: Self-pay | Admitting: *Deleted

## 2021-10-08 ENCOUNTER — Emergency Department (HOSPITAL_COMMUNITY)
Admission: EM | Admit: 2021-10-08 | Discharge: 2021-10-09 | Disposition: A | Discharge status: Home / Self Care | Payer: Medicaid Other | Attending: Emergency Medicine | Admitting: Emergency Medicine

## 2021-10-08 DIAGNOSIS — F29 Unspecified psychosis not due to a substance or known physiological condition: Secondary | ICD-10-CM | POA: Insufficient documentation

## 2021-10-08 DIAGNOSIS — F17210 Nicotine dependence, cigarettes, uncomplicated: Secondary | ICD-10-CM | POA: Insufficient documentation

## 2021-10-08 DIAGNOSIS — F1914 Other psychoactive substance abuse with psychoactive substance-induced mood disorder: Secondary | ICD-10-CM | POA: Insufficient documentation

## 2021-10-08 DIAGNOSIS — R45851 Suicidal ideations: Secondary | ICD-10-CM

## 2021-10-08 DIAGNOSIS — F1910 Other psychoactive substance abuse, uncomplicated: Secondary | ICD-10-CM | POA: Insufficient documentation

## 2021-10-08 DIAGNOSIS — Y900 Blood alcohol level of less than 20 mg/100 ml: Secondary | ICD-10-CM | POA: Insufficient documentation

## 2021-10-08 DIAGNOSIS — Z79899 Other long term (current) drug therapy: Secondary | ICD-10-CM | POA: Insufficient documentation

## 2021-10-08 DIAGNOSIS — A599 Trichomoniasis, unspecified: Secondary | ICD-10-CM

## 2021-10-08 LAB — CBC WITH DIFFERENTIAL/PLATELET
Abs Immature Granulocytes: 0.03 10*3/uL (ref 0.00–0.07)
Basophils Absolute: 0 10*3/uL (ref 0.0–0.1)
Basophils Relative: 1 %
Eosinophils Absolute: 0.1 10*3/uL (ref 0.0–0.5)
Eosinophils Relative: 1 %
HCT: 41 % (ref 36.0–46.0)
Hemoglobin: 14.3 g/dL (ref 12.0–15.0)
Immature Granulocytes: 0 %
Lymphocytes Relative: 22 %
Lymphs Abs: 1.8 10*3/uL (ref 0.7–4.0)
MCH: 32.9 pg (ref 26.0–34.0)
MCHC: 34.9 g/dL (ref 30.0–36.0)
MCV: 94.3 fL (ref 80.0–100.0)
Monocytes Absolute: 0.4 10*3/uL (ref 0.1–1.0)
Monocytes Relative: 4 %
Neutro Abs: 6.1 10*3/uL (ref 1.7–7.7)
Neutrophils Relative %: 72 %
Platelets: 312 10*3/uL (ref 150–400)
RBC: 4.35 MIL/uL (ref 3.87–5.11)
RDW: 12.2 % (ref 11.5–15.5)
WBC: 8.5 10*3/uL (ref 4.0–10.5)
nRBC: 0 % (ref 0.0–0.2)

## 2021-10-08 LAB — URINALYSIS, ROUTINE W REFLEX MICROSCOPIC
Bilirubin Urine: NEGATIVE
Glucose, UA: NEGATIVE mg/dL
Hgb urine dipstick: NEGATIVE
Ketones, ur: NEGATIVE mg/dL
Nitrite: NEGATIVE
Protein, ur: NEGATIVE mg/dL
Specific Gravity, Urine: 1.025 (ref 1.005–1.030)
pH: 6 (ref 5.0–8.0)

## 2021-10-08 LAB — I-STAT BETA HCG BLOOD, ED (MC, WL, AP ONLY): I-stat hCG, quantitative: <5 m[IU]/mL (ref <5)

## 2021-10-08 LAB — SALICYLATE LEVEL: Salicylate Lvl: <7 mg/dL — ABNORMAL LOW (ref 7.0–30.0)

## 2021-10-08 LAB — HIV ANTIBODY (ROUTINE TESTING W REFLEX): HIV Screen 4th Generation wRfx: NONREACTIVE

## 2021-10-08 LAB — COMPREHENSIVE METABOLIC PANEL
ALT: 52 U/L — ABNORMAL HIGH (ref 0–44)
AST: 33 U/L (ref 15–41)
Albumin: 4.8 g/dL (ref 3.5–5.0)
Alkaline Phosphatase: 55 U/L (ref 38–126)
Anion gap: 10 (ref 5–15)
BUN: 11 mg/dL (ref 6–20)
CO2: 23 mmol/L (ref 22–32)
Calcium: 9.6 mg/dL (ref 8.9–10.3)
Chloride: 103 mmol/L (ref 98–111)
Creatinine, Ser: 0.68 mg/dL (ref 0.44–1.00)
GFR, Estimated: >60 mL/min (ref >60)
Glucose, Bld: 102 mg/dL — ABNORMAL HIGH (ref 70–99)
Potassium: 3.9 mmol/L (ref 3.5–5.1)
Sodium: 136 mmol/L (ref 135–145)
Total Bilirubin: 0.8 mg/dL (ref 0.3–1.2)
Total Protein: 8.2 g/dL — ABNORMAL HIGH (ref 6.5–8.1)

## 2021-10-08 LAB — RAPID URINE DRUG SCREEN, HOSP PERFORMED
Amphetamines: NOT DETECTED
Barbiturates: NOT DETECTED
Benzodiazepines: NOT DETECTED
Cocaine: NOT DETECTED
Opiates: NOT DETECTED
Tetrahydrocannabinol: POSITIVE — AB

## 2021-10-08 LAB — ETHANOL: Alcohol, Ethyl (B): <10 mg/dL (ref <10)

## 2021-10-08 LAB — URINALYSIS, MICROSCOPIC (REFLEX)

## 2021-10-08 LAB — ACETAMINOPHEN LEVEL: Acetaminophen (Tylenol), Serum: <10 ug/mL — ABNORMAL LOW (ref 10–30)

## 2021-10-08 MED ORDER — NICOTINE 21 MG/24HR TD PT24: 21.0000 mg | MEDICATED_PATCH | Freq: Every day | TRANSDERMAL | Status: DC

## 2021-10-08 MED ORDER — RISPERIDONE 1 MG PO TBDP
1.0000 mg | ORAL_TABLET | Freq: Once | ORAL | Status: AC
  Administered 2021-10-08: 1 mg via ORAL
  Filled 2021-10-08: qty 1

## 2021-10-08 MED ORDER — RISPERIDONE 1 MG PO TBDP
2.0000 mg | ORAL_TABLET | Freq: Every day | ORAL | Status: DC
  Administered 2021-10-08: 2 mg via ORAL
  Filled 2021-10-08: qty 2

## 2021-10-08 MED ORDER — ACETAMINOPHEN 325 MG PO TABS: 650.0000 mg | ORAL_TABLET | ORAL | Status: DC | PRN

## 2021-10-08 MED ORDER — ALUM & MAG HYDROXIDE-SIMETH 200-200-20 MG/5ML PO SUSP: 30.0000 mL | Freq: Four times a day (QID) | ORAL | Status: DC | PRN

## 2021-10-08 MED ORDER — NICOTINE 21 MG/24HR TD PT24
21.0000 mg | MEDICATED_PATCH | Freq: Every day | TRANSDERMAL | Status: DC
  Administered 2021-10-08 – 2021-10-09 (×2): 21 mg via TRANSDERMAL
  Filled 2021-10-08 (×2): qty 1

## 2021-10-08 MED ORDER — ONDANSETRON 4 MG PO TBDP
4.0000 mg | ORAL_TABLET | Freq: Once | ORAL | Status: AC
  Administered 2021-10-08: 4 mg via ORAL
  Filled 2021-10-08: qty 1

## 2021-10-08 MED ORDER — ONDANSETRON HCL 4 MG PO TABS: 4.0000 mg | ORAL_TABLET | Freq: Three times a day (TID) | ORAL | Status: DC | PRN

## 2021-10-08 MED ORDER — METRONIDAZOLE 500 MG PO TABS
2000.0000 mg | ORAL_TABLET | Freq: Once | ORAL | Status: AC
  Administered 2021-10-08: 2000 mg via ORAL
  Filled 2021-10-08: qty 4

## 2021-10-08 NOTE — ED Triage Notes (Signed)
States she wants to be committed to get her medication straight. States she is hearing things and seeing things

## 2021-10-08 NOTE — ED Notes (Signed)
Pt dressed out and wanded by security.  

## 2021-10-08 NOTE — ED Notes (Signed)
Pt refused vitals 

## 2021-10-08 NOTE — ED Provider Notes (Signed)
Emergency Department Provider Note   I have reviewed the triage vital signs and the nursing notes.   HISTORY  Chief Complaint V70.1   HPI Barbara Gordon is a 30 y.o. female with PMH reviewed below presents to the ED with report of feeling "out of my body" and intermittent suicidal thoughts.  Patient tells me that she has been taking her risperidone since her most recent ED evaluation.  In chart review, she was admitted to behavioral health in the [place] system.  After remaining compliant with her respite all she improved and was discharged without suicidal ideation.  At first, the patient tells me that she has been hearing things and seeing things.  When I asked her to elaborate she says that actually as the reason she was put on the Risperdal but has since been feeling strange and out of it.  She reports intermittent thoughts of harming herself with a plan to take her medications. Denies substance use recently but has a history of substance use in the past.    History reviewed. No pertinent past medical history.  Patient Active Problem List   Diagnosis Date Noted   Polysubstance abuse (HCC) 05/24/2021   Substance-induced disorder (HCC) 05/24/2021    History reviewed. No pertinent surgical history.  Allergies Latex and Tramadol  No family history on file.  Social History Social History   Tobacco Use   Smoking status: Some Days    Types: Cigarettes   Smokeless tobacco: Never  Substance Use Topics   Drug use: Never    Review of Systems  Constitutional: No fever/chills Eyes: No visual changes. ENT: No sore throat. Cardiovascular: Denies chest pain. Respiratory: Denies shortness of breath. Gastrointestinal: No abdominal pain.  No nausea, no vomiting.  No diarrhea.  No constipation. Genitourinary: Negative for dysuria. Musculoskeletal: Negative for back pain. Skin: Negative for rash. Neurological: Negative for headaches, focal weakness or numbness.  10-point ROS  otherwise negative.  ____________________________________________   PHYSICAL EXAM:  VITAL SIGNS: ED Triage Vitals  Enc Vitals Group     BP 10/08/21 1243 135/79     Pulse Rate 10/08/21 1243 90     Resp 10/08/21 1243 18     Temp 10/08/21 1243 97.9 F (36.6 C)     Temp Source 10/08/21 1243 Oral     SpO2 10/08/21 1243 98 %    Constitutional: Alert and oriented. Well appearing and in no acute distress. Eyes: Conjunctivae are normal.  Head: Atraumatic. Nose: No congestion/rhinnorhea. Mouth/Throat: Mucous membranes are moist.   Neck: No stridor.  Cardiovascular: Normal rate, regular rhythm. Good peripheral circulation. Grossly normal heart sounds.   Respiratory: Normal respiratory effort.  No retractions. Lungs CTAB. Gastrointestinal: Soft and nontender. No distention.  Musculoskeletal: No lower extremity tenderness nor edema. No gross deformities of extremities. Neurologic:  Normal speech and language. No gross focal neurologic deficits are appreciated.  Skin:  Skin is warm, dry and intact. No rash noted. Psychiatric: Mood and affect are flat. Speech and behavior are normal.  ____________________________________________   LABS (all labs ordered are listed, but only abnormal results are displayed)  Labs Reviewed  COMPREHENSIVE METABOLIC PANEL - Abnormal; Notable for the following components:      Result Value   Glucose, Bld 102 (*)    Total Protein 8.2 (*)    ALT 52 (*)    All other components within normal limits  RESP PANEL BY RT-PCR (FLU A&B, COVID) ARPGX2  CBC WITH DIFFERENTIAL/PLATELET  ACETAMINOPHEN LEVEL  ETHANOL  SALICYLATE  LEVEL  RAPID URINE DRUG SCREEN, HOSP PERFORMED  URINALYSIS, ROUTINE W REFLEX MICROSCOPIC  I-STAT BETA HCG BLOOD, ED ([facility], [facility], [facility] ONLY)   ____________________________________________  EKG  Patient refused  ____________________________________________  RADIOLOGY  None    ____________________________________________   PROCEDURES  Procedure(s) performed:   Procedures  None  ____________________________________________   INITIAL IMPRESSION / ASSESSMENT AND PLAN / ED COURSE  Pertinent labs & imaging results that were available during my care of the patient were reviewed by me and considered in my medical decision making (see chart for details).   Patient presents to the emergency department with history described above.  She describes some intermittent suicidal thoughts with plan to take her medications to harm herself.  She also describes some hallucinations but unclear if she is having active hallucinations.  Patient describes feeling like her Risperdal is causing her symptoms and is seeking evaluation.  She was recently admitted in the [facility] system to their behavioral health hospital from the emergency department. Denies any medical complaints at this time.   02:30 PM  CBC and i-STAT pregnancy are within normal limits.  Vital signs remained within normal limits.  Ordered as needed meds.  Order TTS consult.  Patient is medically clear for TTS evaluation and disposition.  Patient refusing EKG claiming that the nurse is trying to poison her.  ____________________________________________  FINAL CLINICAL IMPRESSION(S) / ED DIAGNOSES  Final diagnoses:  Suicidal ideation     MEDICATIONS GIVEN DURING THIS VISIT:  Medications  acetaminophen (TYLENOL) tablet 650 mg (has no administration in time range)  ondansetron (ZOFRAN) tablet 4 mg (has no administration in time range)  alum & mag hydroxide-simeth (MAALOX/MYLANTA) 200-200-20 MG/5ML suspension 30 mL (has no administration in time range)  nicotine (NICODERM CQ - dosed in mg/24 hours) patch 21 mg (has no administration in time range)    Note:  This document was prepared using Dragon voice recognition software and may include unintentional dictation errors.  Alona Bene, MD, [facility]    Modesty Rudy, Arlyss Repress, MD 10/12/21 [phone]

## 2021-10-08 NOTE — ED Notes (Signed)
Attempted to perform EKG, pt refused at this time. States she "knows we are trying to poison her". Explained what ekg was and why it was ordered. Pt rocking back and forth. Will attempt again when pt more agreeable
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
## 2021-10-08 NOTE — ED Provider Notes (Signed)
Patient's urine shows trichomonas.  Patient denies urine or vaginal symptoms.  Discussed this with her and it is unclear if she will be able to ultimately afford and/or take 7 days of antibiotics so I will give her a 2 g dose of Flagyl.  We will also check urine and blood for other STI.  Currently medically stable for psychiatric disposition.   Pricilla Loveless, MD 10/08/21 [phone]

## 2021-10-08 NOTE — ED Notes (Signed)
Per Elta Guadel[place], NP, recommends overnight observation. Patient will be re-started back on medications and re-revaluated in the am.

## 2021-10-09 ENCOUNTER — Other Ambulatory Visit: Payer: Self-pay

## 2021-10-09 ENCOUNTER — Ambulatory Visit (HOSPITAL_COMMUNITY)
Admission: EM | Admit: 2021-10-09 | Discharge: 2021-10-10 | Disposition: A | Discharge status: Home / Self Care | Payer: No Payment, Other | Attending: Physician Assistant | Admitting: Physician Assistant

## 2021-10-09 DIAGNOSIS — Z814 Family history of other substance abuse and dependence: Secondary | ICD-10-CM | POA: Insufficient documentation

## 2021-10-09 DIAGNOSIS — Z20822 Contact with and (suspected) exposure to covid-19: Secondary | ICD-10-CM | POA: Insufficient documentation

## 2021-10-09 DIAGNOSIS — F17210 Nicotine dependence, cigarettes, uncomplicated: Secondary | ICD-10-CM | POA: Insufficient documentation

## 2021-10-09 DIAGNOSIS — R440 Auditory hallucinations: Secondary | ICD-10-CM | POA: Insufficient documentation

## 2021-10-09 DIAGNOSIS — F32A Depression, unspecified: Secondary | ICD-10-CM | POA: Insufficient documentation

## 2021-10-09 DIAGNOSIS — Z818 Family history of other mental and behavioral disorders: Secondary | ICD-10-CM | POA: Insufficient documentation

## 2021-10-09 DIAGNOSIS — R45851 Suicidal ideations: Secondary | ICD-10-CM

## 2021-10-09 DIAGNOSIS — R450 Nervousness: Secondary | ICD-10-CM | POA: Insufficient documentation

## 2021-10-09 DIAGNOSIS — Z765 Malingerer [conscious simulation]: Secondary | ICD-10-CM | POA: Insufficient documentation

## 2021-10-09 DIAGNOSIS — Z5900 Homelessness unspecified: Secondary | ICD-10-CM | POA: Diagnosis not present

## 2021-10-09 DIAGNOSIS — R441 Visual hallucinations: Secondary | ICD-10-CM | POA: Insufficient documentation

## 2021-10-09 DIAGNOSIS — F419 Anxiety disorder, unspecified: Secondary | ICD-10-CM | POA: Insufficient documentation

## 2021-10-09 DIAGNOSIS — G4700 Insomnia, unspecified: Secondary | ICD-10-CM | POA: Insufficient documentation

## 2021-10-09 DIAGNOSIS — F1910 Other psychoactive substance abuse, uncomplicated: Secondary | ICD-10-CM | POA: Insufficient documentation

## 2021-10-09 DIAGNOSIS — Z79899 Other long term (current) drug therapy: Secondary | ICD-10-CM | POA: Insufficient documentation

## 2021-10-09 LAB — POCT URINE DRUG SCREEN - MANUAL ENTRY (I-SCREEN)
POC Amphetamine UR: NOT DETECTED
POC Buprenorphine (BUP): NOT DETECTED
POC Cocaine UR: NOT DETECTED
POC Marijuana UR: POSITIVE — AB
POC Methadone UR: NOT DETECTED
POC Methamphetamine UR: NOT DETECTED
POC Morphine: NOT DETECTED
POC Oxazepam (BZO): NOT DETECTED
POC Oxycodone UR: NOT DETECTED
POC Secobarbital (BAR): NOT DETECTED

## 2021-10-09 LAB — RPR: RPR Ser Ql: NONREACTIVE

## 2021-10-09 LAB — POC SARS CORONAVIRUS 2 AG -  ED: SARS Coronavirus 2 Ag: NEGATIVE

## 2021-10-09 LAB — POC SARS CORONAVIRUS 2 AG: SARSCOV2ONAVIRUS 2 AG: NEGATIVE

## 2021-10-09 LAB — POCT PREGNANCY, URINE: Preg Test, Ur: NEGATIVE

## 2021-10-09 MED ORDER — ALUM & MAG HYDROXIDE-SIMETH 200-200-20 MG/5ML PO SUSP: 30.0000 mL | ORAL | Status: DC | PRN

## 2021-10-09 MED ORDER — HYDROXYZINE HCL 25 MG PO TABS: 25.0000 mg | ORAL_TABLET | Freq: Three times a day (TID) | ORAL | Status: DC | PRN

## 2021-10-09 MED ORDER — TRAZODONE HCL 50 MG PO TABS: 50.0000 mg | ORAL_TABLET | Freq: Every evening | ORAL | Status: DC | PRN

## 2021-10-09 MED ORDER — ACETAMINOPHEN 325 MG PO TABS: 650.0000 mg | ORAL_TABLET | Freq: Four times a day (QID) | ORAL | Status: DC | PRN

## 2021-10-09 MED ORDER — MAGNESIUM HYDROXIDE 400 MG/5ML PO SUSP: 30.0000 mL | Freq: Every day | ORAL | Status: DC | PRN

## 2021-10-09 NOTE — Consult Note (Signed)
Telepsych Consultation   Reason for Consult:  psych consult Referring Physician:  Alona Bene, MD Location of Patient:  [facility] Location of Provider: Behavioral Health TTS Department  Patient Identification: Barbara Gordon MRN:  [id number] Principal Diagnosis: Suicidal ideation Diagnosis:  Principal Problem:   Suicidal ideation   Total Time spent with patient: 20 minutes  Subjective:   Barbara Gordon is a 30 y.o. female patient admitted with suicidal ideations.  Patient presents alert and oriented; bizarre, flat affect. "I was just feeling suicidal". "Not sure" what brought feelings on or length of time she's had the feelings. States she currently lives with one of her friends in [place] but "doesn't really have anywhere to go". States she is from [place], [place]. Responds vaguely and "not sure" to majority of the assessment questions. States she would "like to get her medications right"; when asked about which medications she responded "Risperidone I guess".   She endorses chronic suicidal ideations; denies any recent substance abuse, homicidal ideations, auditory and visual hallucinations, and does not appear to be responding to any external/internal hallucinations. She denies any safety concerns. Per chart review patient has extensive ED encounters between [facility] ([place], [place] and [facility]; 9 for the month of November for suicidal ideations, substance induced disorder, bizarre behavior, borderline personality, and malingering. Multiple recent incarcerations. UDS+THC.   Past Psychiatric History: borderline personality, malingering, substance-induced disorder, suicidal ideation, bizarre behavior, altered mental status, bipolar disorder, polysubstance abuse, anxiety  Risk to Self:  pt denies Risk to Others:  pt denies Prior Inpatient Therapy:  yes Prior Outpatient Therapy:  yes  Past Medical History: History reviewed. No pertinent past medical  history. History reviewed. No pertinent surgical history. Family History: No family history on file. Family Psychiatric  History: not noted Social History:  Social History   Substance and Sexual Activity  Alcohol Use None     Social History   Substance and Sexual Activity  Drug Use Never    Social History   Socioeconomic History   Marital status: Single    Spouse name: Not on file   Number of children: Not on file   Years of education: Not on file   Highest education level: Not on file  Occupational History   Not on file  Tobacco Use   Smoking status: Some Days    Types: Cigarettes   Smokeless tobacco: Never  Substance and Sexual Activity   Alcohol use: Not on file   Drug use: Never   Sexual activity: Never  Other Topics Concern   Not on file  Social History Narrative   Not on file   Social Determinants of Health   Financial Resource Strain: Not on file  Food Insecurity: Not on file  Transportation Needs: Not on file  Physical Activity: Not on file  Stress: Not on file  Social Connections: Not on file   Additional Social History:    Allergies:   Allergies  Allergen Reactions   Latex Itching    Other reaction(s): Rash   Tramadol Itching    Other reaction(s): Itching Provider: Crist Fat CFM - Allergy Description: TraMADol HCl *ANALGESICS - OPIOID* CFM - Allergy Annotation: Pruritus.  1Provider: Crist Fat CFM - Allergy Description: TraMADol HCl *ANALGESICS - OPIOID* CFM - Allergy Annotation: Pruritus.      Labs:  Results for orders placed or performed during the hospital encounter of 10/08/21 (from the past 48 hour(s))  Urine rapid drug screen (hosp performed)     Status: Abnormal  Collection Time: 10/08/21  1:57 PM  Result Value Ref Range   Opiates NONE DETECTED NONE DETECTED   Cocaine NONE DETECTED NONE DETECTED   Benzodiazepines NONE DETECTED NONE DETECTED   Amphetamines NONE DETECTED NONE DETECTED   Tetrahydrocannabinol POSITIVE (A)  NONE DETECTED   Barbiturates NONE DETECTED NONE DETECTED    Comment: (NOTE) DRUG SCREEN FOR MEDICAL PURPOSES ONLY.  IF CONFIRMATION IS NEEDED FOR ANY PURPOSE, NOTIFY LAB WITHIN 5 DAYS.  LOWEST DETECTABLE LIMITS FOR URINE DRUG SCREEN Drug Class                     Cutoff (ng/mL) Amphetamine and metabolites    1000 Barbiturate and metabolites    200 Benzodiazepine                 200 Tricyclics and metabolites     300 Opiates and metabolites        300 Cocaine and metabolites        300 THC                            50 Performed at [facility], [address], [place], [place] [address]   Urinalysis, Routine w reflex microscopic Urine, Clean Catch     Status: Abnormal   Collection Time: 10/08/21  1:57 PM  Result Value Ref Range   Color, Urine YELLOW YELLOW   APPearance HAZY (A) CLEAR   Specific Gravity, Urine 1.025 1.005 - 1.030   pH 6.0 5.0 - 8.0   Glucose, UA NEGATIVE NEGATIVE mg/dL   Hgb urine dipstick NEGATIVE NEGATIVE   Bilirubin Urine NEGATIVE NEGATIVE   Ketones, ur NEGATIVE NEGATIVE mg/dL   Protein, ur NEGATIVE NEGATIVE mg/dL   Nitrite NEGATIVE NEGATIVE   Leukocytes,Ua SMALL (A) NEGATIVE    Comment: Performed at [facility], [address], [place], [place] [address]  Urinalysis, Microscopic (reflex)     Status: Abnormal   Collection Time: 10/08/21  1:57 PM  Result Value Ref Range   RBC / HPF 0-5 0 - 5 RBC/hpf   WBC, UA 6-10 0 - 5 WBC/hpf   Bacteria, UA MANY (A) NONE SEEN   Squamous Epithelial / LPF 21-50 0 - 5   Trichomonas, UA PRESENT (A) NONE SEEN   Ca Oxalate Crys, UA PRESENT     Comment: Performed at [facility], [address], [place], [place] [address]  Comprehensive metabolic panel     Status: Abnormal   Collection Time: 10/08/21  2:06 PM  Result Value Ref Range   Sodium 136 135 - 145 mmol/L   Potassium 3.9 3.5 - 5.1 mmol/L   Chloride 103 98 - 111 mmol/L   CO2 23 22 - 32 mmol/L   Glucose, Bld 102 (H) 70 - 99 mg/dL    Comment: Glucose reference range  applies only to samples taken after fasting for at least 8 hours.   BUN 11 6 - 20 mg/dL   Creatinine, Ser 6.96 0.44 - 1.00 mg/dL   Calcium 9.6 8.9 - 29.5 mg/dL   Total Protein 8.2 (H) 6.5 - 8.1 g/dL   Albumin 4.8 3.5 - 5.0 g/dL   AST 33 15 - 41 U/L   ALT 52 (H) 0 - 44 U/L   Alkaline Phosphatase 55 38 - 126 U/L   Total Bilirubin 0.8 0.3 - 1.2 mg/dL   GFR, Estimated >28 >41 mL/min    Comment: (NOTE) Calculated using the CKD-EPI Creatinine  Equation (2021)    Anion gap 10 5 - 15    Comment: Performed at [facility], [address], [place], [place] [address]  Acetaminophen level     Status: Abnormal   Collection Time: 10/08/21  2:06 PM  Result Value Ref Range   Acetaminophen (Tylenol), Serum <10 (L) 10 - 30 ug/mL    Comment: (NOTE) Therapeutic concentrations vary significantly. A range of 10-30 ug/mL  may be an effective concentration for many patients. However, some  are best treated at concentrations outside of this range. Acetaminophen concentrations >150 ug/mL at 4 hours after ingestion  and >50 ug/mL at 12 hours after ingestion are often associated with  toxic reactions.  Performed at [facility], [address], [place], [place] [address]   Ethanol     Status: None   Collection Time: 10/08/21  2:06 PM  Result Value Ref Range   Alcohol, Ethyl (B) <10 <10 mg/dL    Comment: (NOTE) Lowest detectable limit for serum alcohol is 10 mg/dL.  For medical purposes only. Performed at [facility], [address], [place], [place] [address]   Salicylate level     Status: Abnormal   Collection Time: 10/08/21  2:06 PM  Result Value Ref Range   Salicylate Lvl <7.0 (L) 7.0 - 30.0 mg/dL    Comment: Performed at [facility], [address] [place], [place] [address]  CBC with Differential     Status: None   Collection Time: 10/08/21  2:06 PM  Result Value Ref Range   WBC 8.5 4.0 - 10.5 K/uL   RBC 4.35 3.87 - 5.11 MIL/uL   Hemoglobin 14.3 12.0 - 15.0 g/dL   HCT 86.7 54.4 - 92.0 %    MCV 94.3 80.0 - 100.0 fL   MCH 32.9 26.0 - 34.0 pg   MCHC 34.9 30.0 - 36.0 g/dL   RDW 10.0 71.2 - 19.7 %   Platelets 312 150 - 400 K/uL   nRBC 0.0 0.0 - 0.2 %   Neutrophils Relative % 72 %   Neutro Abs 6.1 1.7 - 7.7 K/uL   Lymphocytes Relative 22 %   Lymphs Abs 1.8 0.7 - 4.0 K/uL   Monocytes Relative 4 %   Monocytes Absolute 0.4 0.1 - 1.0 K/uL   Eosinophils Relative 1 %   Eosinophils Absolute 0.1 0.0 - 0.5 K/uL   Basophils Relative 1 %   Basophils Absolute 0.0 0.0 - 0.1 K/uL   Immature Granulocytes 0 %   Abs Immature Granulocytes 0.03 0.00 - 0.07 K/uL    Comment: Performed at [facility], [address] [place], [place] [address]  RPR     Status: None   Collection Time: 10/08/21  2:06 PM  Result Value Ref Range   RPR Ser Ql NON REACTIVE NON REACTIVE    Comment: Performed at [facility], [address], [place], [place] [address]  HIV Antibody (routine testing w rflx)     Status: None   Collection Time: 10/08/21  2:06 PM  Result Value Ref Range   HIV Screen 4th Generation wRfx Non Reactive Non Reactive    Comment: Performed at [facility], [address], [place], [place] [address]  I-Stat beta hCG blood, ED     Status: None   Collection Time: 10/08/21  2:13 PM  Result Value Ref Range   I-stat hCG, quantitative <5.0 <5 mIU/mL   Comment 3            Comment:  GEST. AGE      CONC.  (mIU/mL)   <=1 WEEK        5 - 50     2 WEEKS       50 - 500     3 WEEKS       100 - 10,000     4 WEEKS     1,000 - 30,000        FEMALE AND NON-PREGNANT FEMALE:     LESS THAN 5 mIU/mL     Medications:  Current Facility-Administered Medications  Medication Dose Route Frequency Provider Last Rate Last Admin   acetaminophen (TYLENOL) tablet 650 mg  650 mg Oral Q4H PRN Long, Arlyss Repress, MD       alum & mag hydroxide-simeth (MAALOX/MYLANTA) 200-200-20 MG/5ML suspension 30 mL  30 mL Oral Q6H PRN Long, Arlyss Repress, MD       nicotine (NICODERM CQ - dosed in mg/24 hours) patch 21 mg  21 mg  Transdermal Daily Laveda Abbe, NP   21 mg at 10/09/21 1052   ondansetron (ZOFRAN) tablet 4 mg  4 mg Oral Q8H PRN Long, Arlyss Repress, MD       risperiDONE (RISPERDAL M-TABS) disintegrating tablet 2 mg  2 mg Oral QHS Laveda Abbe, NP   2 mg at 10/08/21 2343   Current Outpatient Medications  Medication Sig Dispense Refill   risperiDONE (RISPERDAL) 2 MG tablet Take 2 mg by mouth at bedtime.     Musculoskeletal: Strength & Muscle Tone: within normal limits Gait & Station: normal Patient leans: N/A  Psychiatric Specialty Exam:  Presentation  General Appearance: Casual  Eye Contact:Fair  Speech:Clear and Coherent; Slow  Speech Volume:Normal  Handedness:No data recorded  Mood and Affect  Mood:Euthymic  Affect:Congruent  Thought Process  Thought Processes:Coherent  Descriptions of Associations:Intact  Orientation:Full (Time, Place and Person)  Thought Content:Logical; WDL  History of Schizophrenia/Schizoaffective disorder:No  Duration of Psychotic Symptoms:No data recorded Hallucinations:No data recorded Ideas of Reference:None  Suicidal Thoughts:No data recorded Homicidal Thoughts:No data recorded  Sensorium  Memory:Immediate Fair; Recent Fair; Remote Fair  Judgment:Fair  Insight:Fair   Executive Functions  Concentration:Fair  Attention Span:Fair  Recall:Fair  Fund of Knowledge:Fair  Language:Fair   Psychomotor Activity  Psychomotor Activity:No data recorded  Assets  Assets:Physical Health; Resilience; Housing   Sleep  Sleep:No data recorded   Physical Exam: Physical Exam Vitals and nursing note reviewed.  Constitutional:      Appearance: She is normal weight. She is not ill-appearing or toxic-appearing.  HENT:     Head: Normocephalic.     Nose: Nose normal.     Mouth/Throat:     Mouth: Mucous membranes are moist.     Pharynx: Oropharynx is clear.  Eyes:     Pupils: Pupils are equal, round, and reactive to light.   Cardiovascular:     Rate and Rhythm: Normal rate.     Pulses: Normal pulses.  Pulmonary:     Effort: Pulmonary effort is normal.  Abdominal:     General: Abdomen is flat.  Musculoskeletal:        General: Normal range of motion.     Cervical back: Normal range of motion.  Skin:    General: Skin is warm and dry.  Neurological:     Mental Status: She is alert. Mental status is at baseline.  Psychiatric:        Attention and Perception: She does not perceive auditory or visual hallucinations.  Mood and Affect: Affect is flat.        Speech: Speech normal.        Behavior: Behavior is withdrawn. Behavior is cooperative.        Thought Content: Thought content is not paranoid or delusional. Thought content does not include homicidal or suicidal ideation. Thought content does not include homicidal or suicidal plan.        Cognition and Memory: Cognition and memory normal.        Judgment: Judgment normal.   Review of Systems  Psychiatric/Behavioral:  Positive for substance abuse. Negative for hallucinations and suicidal ideas.   All other systems reviewed and are negative. Blood pressure 110/80, pulse 97, temperature 98.7 F (37.1 C), resp. rate 19, last menstrual period 09/08/2021, SpO2 97 %. There is no height or weight on file to calculate BMI.  Treatment Plan Summary: Plan Patient discharged with plan to follow up with outpatient services.   Disposition: No evidence of imminent risk to self or others at present.   Patient does not meet criteria for psychiatric inpatient admission. Supportive therapy provided about ongoing stressors. Discussed crisis plan, support from social network, calling 911, coming to the Emergency Department, and calling Suicide Hotline.  This service was provided via telemedicine using a 2-way, interactive audio and video technology.  Names of all persons participating in this telemedicine service and their role in this encounter. Name: Maxie Barb Role: PMHNP  Name: Nelly Rout Role: Attending MD  Name: Napoleon Form Role: patient  Name:  Role:     Loletta Parish, NP 10/09/2021 3:39 PM

## 2021-10-09 NOTE — ED Notes (Signed)
Asked patient if I could obtain EKG, she wanted to know why EKG was needed and after I explained she was calm and cooperative.

## 2021-10-09 NOTE — ED Notes (Signed)
Pt ambulated to restroom. 

## 2021-10-09 NOTE — ED Notes (Signed)
Pt A&O x 4, presents with depression &  homelessness, SI with plan to overdose on Risperdal.  Reports hearing a voice to kill her family x 1 week.  Denies HI.  Skin search completed.  No distress noted.  Monitoring for safety.  Pt calm & cooperative, flat affect noted.
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
## 2021-10-09 NOTE — Discharge Instructions (Signed)
Follow-up per recommendations from behavioral health. Return for new concerns.

## 2021-10-09 NOTE — BH Assessment (Signed)
Barbara Gordon is routine. Pt initially reports SI with plan to OD on meds. After talking with her about recent discharge from [facility]. Pt reports going to her friend house in [place] and they told her she could not stay there so now she is homeless and states "I just don't want to be out in the cold". Pt also reports AVH voices that say they are going to hurt her family and VH of things she can not make out. Pt denies recent SA use.

## 2021-10-10 ENCOUNTER — Encounter (HOSPITAL_COMMUNITY): Payer: Self-pay | Admitting: Emergency Medicine

## 2021-10-10 LAB — LIPID PANEL
Cholesterol: 198 mg/dL (ref 0–200)
HDL: 54 mg/dL (ref >40)
LDL Cholesterol: 131 mg/dL — ABNORMAL HIGH (ref 0–99)
Total CHOL/HDL Ratio: 3.7 RATIO
Triglycerides: 63 mg/dL (ref <150)
VLDL: 13 mg/dL (ref 0–40)

## 2021-10-10 LAB — CBC WITH DIFFERENTIAL/PLATELET
Abs Immature Granulocytes: 0.02 10*3/uL (ref 0.00–0.07)
Basophils Absolute: 0.1 10*3/uL (ref 0.0–0.1)
Basophils Relative: 1 %
Eosinophils Absolute: 0.1 10*3/uL (ref 0.0–0.5)
Eosinophils Relative: 1 %
HCT: 42.4 % (ref 36.0–46.0)
Hemoglobin: 14.8 g/dL (ref 12.0–15.0)
Immature Granulocytes: 0 %
Lymphocytes Relative: 23 %
Lymphs Abs: 2.1 10*3/uL (ref 0.7–4.0)
MCH: 32.6 pg (ref 26.0–34.0)
MCHC: 34.9 g/dL (ref 30.0–36.0)
MCV: 93.4 fL (ref 80.0–100.0)
Monocytes Absolute: 0.4 10*3/uL (ref 0.1–1.0)
Monocytes Relative: 4 %
Neutro Abs: 6.4 10*3/uL (ref 1.7–7.7)
Neutrophils Relative %: 71 %
Platelets: 328 10*3/uL (ref 150–400)
RBC: 4.54 MIL/uL (ref 3.87–5.11)
RDW: 12.1 % (ref 11.5–15.5)
WBC: 9 10*3/uL (ref 4.0–10.5)
nRBC: 0 % (ref 0.0–0.2)

## 2021-10-10 LAB — COMPREHENSIVE METABOLIC PANEL
ALT: 44 U/L (ref 0–44)
AST: 30 U/L (ref 15–41)
Albumin: 4.7 g/dL (ref 3.5–5.0)
Alkaline Phosphatase: 51 U/L (ref 38–126)
Anion gap: 10 (ref 5–15)
BUN: 7 mg/dL (ref 6–20)
CO2: 27 mmol/L (ref 22–32)
Calcium: 10.1 mg/dL (ref 8.9–10.3)
Chloride: 99 mmol/L (ref 98–111)
Creatinine, Ser: 0.64 mg/dL (ref 0.44–1.00)
GFR, Estimated: >60 mL/min (ref >60)
Glucose, Bld: 80 mg/dL (ref 70–99)
Potassium: 3.7 mmol/L (ref 3.5–5.1)
Sodium: 136 mmol/L (ref 135–145)
Total Bilirubin: 0.6 mg/dL (ref 0.3–1.2)
Total Protein: 8.1 g/dL (ref 6.5–8.1)

## 2021-10-10 LAB — TSH: TSH: 1.972 u[IU]/mL (ref 0.350–4.500)

## 2021-10-10 LAB — RESP PANEL BY RT-PCR (FLU A&B, COVID) ARPGX2
Influenza A by PCR: NEGATIVE
Influenza B by PCR: NEGATIVE
SARS Coronavirus 2 by RT PCR: NEGATIVE

## 2021-10-10 LAB — ETHANOL: Alcohol, Ethyl (B): <10 mg/dL (ref <10)

## 2021-10-10 LAB — HEMOGLOBIN A1C
Hgb A1c MFr Bld: 5 % (ref 4.8–5.6)
Mean Plasma Glucose: 96.8 mg/dL

## 2021-10-10 NOTE — Progress Notes (Signed)
CSW provided the following resources for the patient listed below:   Shelter List:   [facility] [place]) [address] [place], [place] Phone: [phone]   [facility] has been providing emergency shelter to those in need of a permanent residence for over 35 years. The [facility] shelter plays an important role in our community.   There are many life events that can pull someone into a downward spiral towards poverty that is very difficult to get out of. Homelessness is a problem that can affect anyone of [name]. [facility] is a safe and comforting place to stay, especially if you have experienced the hardship of street life.   [facility] provides a single bed and bedding to 100 adult men and women. The shelter welcomes all who are in need of housing, no one in real need is turned away unless space is not available.   While staying at [facility], guests are offered more than just a bed for a night. Hot meals are provided and every guest has access to case management services. Case managers provide assistance with finding housing, employment, or other services that will help them gain stability. Continuous stay is based on availability, capacity, and progress towards goals.   To contact the front desk of [facility] please call   [phone] ext [phone] or ext. [phone].   [facility] [address] [place], [place] [address] Phone: [phone]   [facility] (Women only) [address] [place], [place] [address] Phone: [phone]   [facility] [address], [place] [address] Phone: [phone]   [facility]: [phone]. [address] [place], [place] [address] Phone: [phone]   [facility] [address] [place], [place] [address] Check in at 6:00PM for placement at a local shelter) Phone: [phone]      [facility]          -(Please Call) Phone: [phone]   [facility] Eligibility:  Must be drug and alcohol free for at least 14 days or more at the time of application. This program serves males.  Houses Engineer, civil (consulting), Economist who serve six-month terms.  Houses are financially self-supporting; members split house expenses, which average $90.00 to $130.00 per person per week.  Crissie Reese, MSW, LCSW-A, LCAS-A Phone: [phone] Disposition/TOC

## 2021-10-10 NOTE — Discharge Instructions (Addendum)
Take all medications as prescribed. Keep all follow-up appointments as scheduled.  Do not consume alcohol or use illegal drugs while on prescription medications. Report any adverse effects from your medications to your primary care provider promptly.  In the event of recurrent symptoms or worsening symptoms, call 911, a crisis hotline, or go to the nearest emergency department for evaluation.      Shelter List:   [facility]) [address] [place], [place] Phone: [phone]   [facility] has been providing emergency shelter to those in need of a permanent residence for over 35 years. The [employer] [facility] shelter plays an important role in our community.   There are many life events that can pull someone into a downward spiral towards poverty that is very difficult to get out of. Homelessness is a problem that can affect anyone of [name]. [facility] is a safe and comforting place to stay, especially if you have experienced the hardship of street life.   [facility] provides a single bed and bedding to 100 adult men and women. The shelter welcomes all who are in need of housing, no one in real need is turned away unless space is not available.   While staying at [facility], guests are offered more than just a bed for a night. Hot meals are provided and every guest has access to case management services. Case managers provide assistance with finding housing, employment, or other services that will help them gain stability. Continuous stay is based on availability, capacity, and progress towards goals.   To contact the front desk of [facility] please call   [phone] ext [phone] or ext. [phone].   [facility] [address] [place], [place] [address] Phone: [phone]   [facility] (Women only) [address] [place], [place] [address] Phone: [phone]   [facility] [address], [place] [address] Phone: [phone]   [facility]: [phone]. [address] [place], [place] [address] Phone: [phone]   [facility] [address] [place], [place] [address] Check in at 6:00PM for placement at a local shelter) Phone: [phone]     [facility]          -(Please Call) Phone: [phone]   [facility] Eligibility:  Must be drug and alcohol free for at least 14 days or more at the time of application. This program serves males.  Houses Engineer, civil (consulting), Economist who serve six-month terms.  Houses are financially self-supporting; members split house expenses, which average $90.00 to $130.00 per person per week.  Any Resident who relapses must be immediately expelled. Call:  [phone]

## 2021-10-10 NOTE — ED Notes (Signed)
Patient waiting on resources

## 2021-10-10 NOTE — ED Notes (Signed)
Pt is awake and alert.  Flat affect   responds appropriately.  When asked if she  is still having thoughts of self harm she states " a little I guess".  Denies plan or intent. Will continue to monitor for safety.

## 2021-10-10 NOTE — ED Notes (Addendum)
Pt given resources and bus pass along with AVS as indicated by provider.   Pt was ambivalent and appeared uncertain. AVS and resources were explained in detail as indicated by provider.   She was given all her belongings back and escorted by [facility] off unit.

## 2021-10-10 NOTE — ED Provider Notes (Signed)
Behavioral Health Admission H&P [facility] & OBS)  Date: 10/10/21 Patient Name: Barbara Gordon MRN: [id number] Chief Complaint:  Chief Complaint  Patient presents with   Suicidal   Homeless      Diagnoses:  Final diagnoses:  Suicidal ideation    HPI:   Barbara Gordon is a 30 year old female with a past psychiatric history significant for polysubstance abuse, suicidal ideations, and substance induced disorder who presents to [facility] with a chief complaint of hearing and seeing and hearing things.  Patient has a past history of malingering behavior and was recently just discharged from [facility] ED.  Patient states that she has been hearing and seeing stuff that is not there for a while.  When asked to explain what she is hearing and seeing she responded "Not sure, don't know how to explain it.  I'm just feeling really weird."  Patient then asked if we did involuntary commitment.  Patient's donations are characterized by voices saying that they are going to kill her family.  Patient denies visual hallucinations.  Patient is currently on medication and states that she takes Risperdal 2 mg daily.  Patient reports that she has not taken her Risperdal for today.  Patient is unsure of who prescribes her medications.  Patient endorses mild depression with her symptoms being not as apparent.  When running down the list of depressive symptoms, she eats the degree in which she is experiencing them as "somewhat."  She endorsed some feelings of sadness, lack of motivation, and decreased concentration.  She denied feelings of guilt/worthlessness and hopelessness then retracted her statement and stated that she kind of felt hopeless.  Patient also endorses changes in her appetite and sleep.  Patient endorses anxiety she rates a 10 out of 10 but denies any major stressors at this time.  Patient reports causing her anxiety to be so high.  When asked about suicidal ideations,  patient endorsed having "a little" suicidal ideations with a plan to overdose on her medications.  She denied homicidal ideations.  She further active denied auditory or visual hallucinations and did not appear to be responding to internal/external stimuli.  Patient endorsed poor sleep receiving on average 2 hours of sleep each night.  Patient endorsed fair appetite stating that she eats on average 2 meals per day.  Patient denied alcohol consumption and illicit drug use.  She endorsed tobacco use stating that she smokes roughly 1/2 pack/day.  In regard to her living situation, patient reported that she recently was staying with a friend before being denied access to the living area.  Patient reported currently being homeless and having nowhere to go.  Patient stated that she was not a danger to herself but was unable to contract for safety.  Patient reported not wanting to be in this world.  PHQ 2-9:   Flowsheet Row ED from 10/09/2021 in [facility] ED from 10/08/2021 in [facility] ED from 05/22/2021 in [facility]  C-SSRS RISK CATEGORY High Risk High Risk No Risk        Total Time spent with patient: 20 minutes  Musculoskeletal  Strength & Muscle Tone: within normal limits Gait & Station: normal Patient leans: N/A  Psychiatric Specialty Exam  Presentation General Appearance: Appropriate for Environment; Casual  Eye Contact:Fair  Speech:Clear and Coherent; Slow  Speech Volume:Normal  Handedness:Right   Mood and Affect  Mood:Euthymic  Affect:Flat   Thought Process  Thought Processes:Coherent; Linear  Descriptions of Associations:Intact  Orientation:Partial  Thought Content:WDL  Diagnosis of Schizophrenia or Schizoaffective disorder in past: No  Duration of Psychotic Symptoms: Less than six months  Hallucinations:Hallucinations: None  Ideas of Reference:None  Suicidal Thoughts:Suicidal  Thoughts: Yes, Passive SI Passive Intent and/or Plan: With Intent; With Plan  Homicidal Thoughts:Homicidal Thoughts: No   Sensorium  Memory:Immediate Fair; Recent Fair; Remote Fair  Judgment:Fair  Insight:Lacking   Executive Functions  Concentration:Fair  Attention Span:Fair; Good  Recall:Fair  Fund of Knowledge:Fair  Language:Good   Psychomotor Activity  Psychomotor Activity:Psychomotor Activity: Normal   Assets  Assets:Desire for Improvement   Sleep  Sleep:Sleep: Poor Number of Hours of Sleep: 2   Nutritional Assessment (For OBS and FBC admissions only) Has the patient had a weight loss or gain of 10 pounds or more in the last 3 months?: No Has the patient had a decrease in food intake/or appetite?: No Does the patient have dental problems?: No Does the patient have eating habits or behaviors that may be indicators of an eating disorder including binging or inducing vomiting?: No Has the patient recently lost weight without trying?: 0 Has the patient been eating poorly because of a decreased appetite?: 0 Malnutrition Screening Tool Score: 0    Physical Exam Psychiatric:        Attention and Perception: Attention and perception normal. She does not perceive auditory or visual hallucinations.        Mood and Affect: Mood is anxious and depressed. Affect is flat.        Speech: Speech normal.        Behavior: Behavior is slowed. Behavior is cooperative.        Thought Content: Thought content includes suicidal ideation. Thought content does not include homicidal ideation. Thought content includes suicidal plan.        Cognition and Memory: Cognition normal. Memory is impaired.        Judgment: Judgment is inappropriate.   Review of Systems  Psychiatric/Behavioral:  Positive for depression and suicidal ideas. Negative for hallucinations and substance abuse. The patient is nervous/anxious and has insomnia.    Blood pressure (!) 129/92, pulse 89, temperature  98 F (36.7 C), temperature source Oral, resp. rate 18, SpO2 97 %. There is no height or weight on file to calculate BMI.  Past Psychiatric History:  Polysubstance abuse Substance-induced disorder Suicide ideation   Is the patient at risk to self? Yes  Has the patient been a risk to self in the past 6 months? Yes .    Has the patient been a risk to self within the distant past? Yes   Is the patient a risk to others? No   Has the patient been a risk to others in the past 6 months? No   Has the patient been a risk to others within the distant past? No   Past Medical History: History reviewed. No pertinent past medical history. History reviewed. No pertinent surgical history.  Family History: History reviewed. No pertinent family history.  Social History:  Social History   Socioeconomic History   Marital status: Single    Spouse name: Not on file   Number of children: Not on file   Years of education: Not on file   Highest education level: Not on file  Occupational History   Not on file  Tobacco Use   Smoking status: Some Days    Types: Cigarettes   Smokeless tobacco: Never  Substance and Sexual Activity   Alcohol use: Not  on file   Drug use: Never   Sexual activity: Never  Other Topics Concern   Not on file  Social History Narrative   Not on file   Social Determinants of Health   Financial Resource Strain: Not on file  Food Insecurity: Not on file  Transportation Needs: Not on file  Physical Activity: Not on file  Stress: Not on file  Social Connections: Not on file  Intimate Partner Violence: Not on file    SDOH:  SDOH Screenings   Alcohol Screen: Not on file  Depression (PHQ2-9): Not on file  Financial Resource Strain: Not on file  Food Insecurity: Not on file  Housing: Not on file  Physical Activity: Not on file  Social Connections: Not on file  Stress: Not on file  Tobacco Use: High Risk   Smoking Tobacco Use: Some Days   Smokeless Tobacco Use:  Never   Passive Exposure: Not on file  Transportation Needs: Not on file    Last Labs:  Admission on 10/09/2021  Component Date Value Ref Range Status   POC Amphetamine UR 10/09/2021 None Detected  NONE DETECTED (Cut Off Level 1000 ng/mL) Final   POC Secobarbital (BAR) 10/09/2021 None Detected  NONE DETECTED (Cut Off Level 300 ng/mL) Final   POC Buprenorphine (BUP) 10/09/2021 None Detected  NONE DETECTED (Cut Off Level 10 ng/mL) Final   POC Oxazepam (BZO) 10/09/2021 None Detected  NONE DETECTED (Cut Off Level 300 ng/mL) Final   POC Cocaine UR 10/09/2021 None Detected  NONE DETECTED (Cut Off Level 300 ng/mL) Final   POC Methamphetamine UR 10/09/2021 None Detected  NONE DETECTED (Cut Off Level 1000 ng/mL) Final   POC Morphine 10/09/2021 None Detected  NONE DETECTED (Cut Off Level 300 ng/mL) Final   POC Oxycodone UR 10/09/2021 None Detected  NONE DETECTED (Cut Off Level 100 ng/mL) Final   POC Methadone UR 10/09/2021 None Detected  NONE DETECTED (Cut Off Level 300 ng/mL) Final   POC Marijuana UR 10/09/2021 Positive (A)  NONE DETECTED (Cut Off Level 50 ng/mL) Final   SARS Coronavirus 2 Ag 10/09/2021 Negative  Negative Preliminary   Preg Test, Ur 10/09/2021 NEGATIVE  NEGATIVE Final   Comment:        THE SENSITIVITY OF THIS METHODOLOGY IS >24 mIU/mL    SARSCOV2ONAVIRUS 2 AG 10/09/2021 NEGATIVE  NEGATIVE Final   Comment: (NOTE) SARS-CoV-2 antigen NOT DETECTED.   Negative results are presumptive.  Negative results do not preclude SARS-CoV-2 infection and should not be used as the sole basis for treatment or other patient management decisions, including infection  control decisions, particularly in the presence of clinical signs and  symptoms consistent with COVID-19, or in those who have been in contact with the virus.  Negative results must be combined with clinical observations, patient history, and epidemiological information. The expected result is Negative.  Fact Sheet for Patients:  https://www.jennings-kim.com/  Fact Sheet for Healthcare Providers: https://alexander-rogers.biz/  This test is not yet approved or cleared by the [place] FDA and  has been authorized for detection and/or diagnosis of SARS-CoV-2 by FDA under an Emergency Use Authorization (EUA).  This EUA will remain in effect (meaning this test can be used) for the duration of  the COV                          ID-19 declaration under Section 564(b)(1) of the Act, 21 U.S.C. section 360bbb-3(b)(1), unless the authorization is terminated or revoked sooner.  Admission on 10/08/2021, Discharged on 10/09/2021  Component Date Value Ref Range Status   Sodium 10/08/2021 136  135 - 145 mmol/L Final   Potassium 10/08/2021 3.9  3.5 - 5.1 mmol/L Final   Chloride 10/08/2021 103  98 - 111 mmol/L Final   CO2 10/08/2021 23  22 - 32 mmol/L Final   Glucose, Bld 10/08/2021 102 (H)  70 - 99 mg/dL Final   Glucose reference range applies only to samples taken after fasting for at least 8 hours.   BUN 10/08/2021 11  6 - 20 mg/dL Final   Creatinine, Ser 10/08/2021 0.68  0.44 - 1.00 mg/dL Final   Calcium 91/47/8295 9.6  8.9 - 10.3 mg/dL Final   Total Protein 62/13/0865 8.2 (H)  6.5 - 8.1 g/dL Final   Albumin 78/46/9629 4.8  3.5 - 5.0 g/dL Final   AST 52/84/1324 33  15 - 41 U/L Final   ALT 10/08/2021 52 (H)  0 - 44 U/L Final   Alkaline Phosphatase 10/08/2021 55  38 - 126 U/L Final   Total Bilirubin 10/08/2021 0.8  0.3 - 1.2 mg/dL Final   GFR, Estimated 10/08/2021 >60  >60 mL/min Final   Comment: (NOTE) Calculated using the CKD-EPI Creatinine Equation (2021)    Anion gap 10/08/2021 10  5 - 15 Final   Performed at [facility], [address] [place], [place] [address]   Acetaminophen (Tylenol), Serum 10/08/2021 <10 (L)  10 - 30 ug/mL Final   Comment: (NOTE) Therapeutic concentrations vary significantly. A range of 10-30 ug/mL  may be an effective concentration for many patients. However,  some  are best treated at concentrations outside of this range. Acetaminophen concentrations >150 ug/mL at 4 hours after ingestion  and >50 ug/mL at 12 hours after ingestion are often associated with  toxic reactions.  Performed at [facility], [address] [place], [place] [address]    Alcohol, Ethyl (B) 10/08/2021 <10  <10 mg/dL Final   Comment: (NOTE) Lowest detectable limit for serum alcohol is 10 mg/dL.  For medical purposes only. Performed at [facility], [address], [place], [place] [address]    Salicylate Lvl 10/08/2021 <7.0 (L)  7.0 - 30.0 mg/dL Final   Performed at [facility], [address], [place], [place] [address]   WBC 10/08/2021 8.5  4.0 - 10.5 K/uL Final   RBC 10/08/2021 4.35  3.87 - 5.11 MIL/uL Final   Hemoglobin 10/08/2021 14.3  12.0 - 15.0 g/dL Final   HCT 95/63/8756 41.0  36.0 - 46.0 % Final   MCV 10/08/2021 94.3  80.0 - 100.0 fL Final   MCH 10/08/2021 32.9  26.0 - 34.0 pg Final   MCHC 10/08/2021 34.9  30.0 - 36.0 g/dL Final   RDW 43/32/9518 12.2  11.5 - 15.5 % Final   Platelets 10/08/2021 312  150 - 400 K/uL Final   nRBC 10/08/2021 0.0  0.0 - 0.2 % Final   Neutrophils Relative % 10/08/2021 72  % Final   Neutro Abs 10/08/2021 6.1  1.7 - 7.7 K/uL Final   Lymphocytes Relative 10/08/2021 22  % Final   Lymphs Abs 10/08/2021 1.8  0.7 - 4.0 K/uL Final   Monocytes Relative 10/08/2021 4  % Final   Monocytes Absolute 10/08/2021 0.4  0.1 - 1.0 K/uL Final   Eosinophils Relative 10/08/2021 1  % Final   Eosinophils Absolute 10/08/2021 0.1  0.0 - 0.5 K/uL Final   Basophils Relative 10/08/2021 1  % Final   Basophils Absolute  10/08/2021 0.0  0.0 - 0.1 K/uL Final   Immature Granulocytes 10/08/2021 0  % Final   Abs Immature Granulocytes 10/08/2021 0.03  0.00 - 0.07 K/uL Final   Performed at [facility], [address] [place], [place] [address]   I-stat hCG, quantitative 10/08/2021 <5.0  <5 mIU/mL Final   Comment 3 10/08/2021          Final   Comment:   GEST.  AGE      CONC.  (mIU/mL)   <=1 WEEK        5 - 50     2 WEEKS       50 - 500     3 WEEKS       100 - 10,000     4 WEEKS     1,000 - 30,000        FEMALE AND NON-PREGNANT FEMALE:     LESS THAN 5 mIU/mL    Opiates 10/08/2021 NONE DETECTED  NONE DETECTED Final   Cocaine 10/08/2021 NONE DETECTED  NONE DETECTED Final   Benzodiazepines 10/08/2021 NONE DETECTED  NONE DETECTED Final   Amphetamines 10/08/2021 NONE DETECTED  NONE DETECTED Final   Tetrahydrocannabinol 10/08/2021 POSITIVE (A)  NONE DETECTED Final   Barbiturates 10/08/2021 NONE DETECTED  NONE DETECTED Final   Comment: (NOTE) DRUG SCREEN FOR MEDICAL PURPOSES ONLY.  IF CONFIRMATION IS NEEDED FOR ANY PURPOSE, NOTIFY LAB WITHIN 5 DAYS.  LOWEST DETECTABLE LIMITS FOR URINE DRUG SCREEN Drug Class                     Cutoff (ng/mL) Amphetamine and metabolites    1000 Barbiturate and metabolites    200 Benzodiazepine                 200 Tricyclics and metabolites     300 Opiates and metabolites        300 Cocaine and metabolites        300 THC                            50 Performed at [facility], [address], [place], [place] [address]    Color, Urine 10/08/2021 YELLOW  YELLOW Final   APPearance 10/08/2021 HAZY (A)  CLEAR Final   Specific Gravity, Urine 10/08/2021 1.025  1.005 - 1.030 Final   pH 10/08/2021 6.0  5.0 - 8.0 Final   Glucose, UA 10/08/2021 NEGATIVE  NEGATIVE mg/dL Final   Hgb urine dipstick 10/08/2021 NEGATIVE  NEGATIVE Final   Bilirubin Urine 10/08/2021 NEGATIVE  NEGATIVE Final   Ketones, ur 10/08/2021 NEGATIVE  NEGATIVE mg/dL Final   Protein, ur 42/59/5638 NEGATIVE  NEGATIVE mg/dL Final   Nitrite 75/64/3329 NEGATIVE  NEGATIVE Final   Leukocytes,Ua 10/08/2021 SMALL (A)  NEGATIVE Final   Performed at [facility], [address], [place], [place] [address]   RBC / HPF 10/08/2021 0-5  0 - 5 RBC/hpf Final   WBC, UA 10/08/2021 6-10  0 - 5 WBC/hpf Final   Bacteria, UA 10/08/2021 MANY (A)  NONE SEEN Final    Squamous Epithelial / LPF 10/08/2021 21-50  0 - 5 Final   Trichomonas, UA 10/08/2021 PRESENT (A)  NONE SEEN Final   Ca Oxalate Crys, UA 10/08/2021 PRESENT   Final   Performed at [facility], [address] [place], [place] [address]   RPR Ser Ql 10/08/2021 NON REACTIVE  NON REACTIVE Final   Performed at  [facility], [address] [place], [place] [address]   HIV Screen 4th Generation wRfx 10/08/2021 Non Reactive  Non Reactive Final   Performed at [facility], [address] [place], [place] [address]  Admission on 05/22/2021, Discharged on 05/24/2021  Component Date Value Ref Range Status   SARS Coronavirus 2 by RT PCR 05/22/2021 POSITIVE (A)  NEGATIVE Final   Comment: RESULT CALLED TO, READ BACK BY AND VERIFIED WITH: LANGLEY,K. RN  ON 07.15.2022 BY COHEN,K (NOTE) SARS-CoV-2 target nucleic acids are DETECTED.  The SARS-CoV-2 RNA is generally detectable in upper respiratory specimens during the acute phase of infection. Positive results are indicative of the presence of the identified virus, but do not rule out bacterial infection or co-infection with other pathogens not detected by the test. Clinical correlation with patient history and other diagnostic information is necessary to determine patient infection status. The expected result is Negative.  Fact Sheet for Patients: BloggerCourse.com  Fact Sheet for Healthcare Providers: SeriousBroker.it  This test is not yet approved or cleared by the [place] FDA and  has been authorized for detection and/or diagnosis of SARS-CoV-2 by FDA under an Emergency Use Authorization (EUA).  This EUA will remain in effect (meaning this                           test can be used) for the duration of  the COVID-19 declaration under Section 564(b)(1) of the Act, 21 U.S.C. section 360bbb-3(b)(1), unless the authorization is terminated or revoked sooner.     Influenza A  by PCR 05/22/2021 NEGATIVE  NEGATIVE Final   Influenza B by PCR 05/22/2021 NEGATIVE  NEGATIVE Final   Comment: (NOTE) The Xpert Xpress SARS-CoV-2/FLU/RSV plus assay is intended as an aid in the diagnosis of influenza from Nasopharyngeal swab specimens and should not be used as a sole basis for treatment. Nasal washings and aspirates are unacceptable for Xpert Xpress SARS-CoV-2/FLU/RSV testing.  Fact Sheet for Patients: BloggerCourse.com  Fact Sheet for Healthcare Providers: SeriousBroker.it  This test is not yet approved or cleared by the [place] FDA and has been authorized for detection and/or diagnosis of SARS-CoV-2 by FDA under an Emergency Use Authorization (EUA). This EUA will remain in effect (meaning this test can be used) for the duration of the COVID-19 declaration under Section 564(b)(1) of the Act, 21 U.S.C. section 360bbb-3(b)(1), unless the authorization is terminated or revoked.  Performed at [facility], [address], [place], [place] [address]    Sodium 05/22/2021 135  135 - 145 mmol/L Final   Potassium 05/22/2021 4.1  3.5 - 5.1 mmol/L Final   Chloride 05/22/2021 99  98 - 111 mmol/L Final   CO2 05/22/2021 27  22 - 32 mmol/L Final   Glucose, Bld 05/22/2021 120 (H)  70 - 99 mg/dL Final   Glucose reference range applies only to samples taken after fasting for at least 8 hours.   BUN 05/22/2021 16  6 - 20 mg/dL Final   Creatinine, Ser 05/22/2021 0.86  0.44 - 1.00 mg/dL Final   Calcium 91/47/8295 9.8  8.9 - 10.3 mg/dL Final   Total Protein 62/13/0865 8.3 (H)  6.5 - 8.1 g/dL Final   Albumin 78/46/9629 4.5  3.5 - 5.0 g/dL Final   AST 52/84/1324 39  15 - 41 U/L Final   ALT 05/22/2021 30  0 - 44 U/L Final   Alkaline Phosphatase 05/22/2021 50  38 -  126 U/L Final   Total Bilirubin 05/22/2021 0.7  0.3 - 1.2 mg/dL Final   GFR, Estimated 05/22/2021 >60  >60 mL/min Final   Comment:  (NOTE) Calculated using the CKD-EPI Creatinine Equation (2021)    Anion gap 05/22/2021 9  5 - 15 Final   Performed at [facility], [address], [place], [place] [address]   Alcohol, Ethyl (B) 05/22/2021 <10  <10 mg/dL Final   Comment: (NOTE) Lowest detectable limit for serum alcohol is 10 mg/dL.  For medical purposes only. Performed at [facility], [address] [place], [place] [address]    Opiates 05/22/2021 NONE DETECTED  NONE DETECTED Final   Cocaine 05/22/2021 NONE DETECTED  NONE DETECTED Final   Benzodiazepines 05/22/2021 NONE DETECTED  NONE DETECTED Final   Amphetamines 05/22/2021 NONE DETECTED  NONE DETECTED Final   Tetrahydrocannabinol 05/22/2021 POSITIVE (A)  NONE DETECTED Final   Barbiturates 05/22/2021 NONE DETECTED  NONE DETECTED Final   Comment: (NOTE) DRUG SCREEN FOR MEDICAL PURPOSES ONLY.  IF CONFIRMATION IS NEEDED FOR ANY PURPOSE, NOTIFY LAB WITHIN 5 DAYS.  LOWEST DETECTABLE LIMITS FOR URINE DRUG SCREEN Drug Class                     Cutoff (ng/mL) Amphetamine and metabolites    1000 Barbiturate and metabolites    200 Benzodiazepine                 200 Tricyclics and metabolites     300 Opiates and metabolites        300 Cocaine and metabolites        300 THC                            50 Performed at [facility], [address] [place], [place] [address]    WBC 05/22/2021 3.5 (L)  4.0 - 10.5 K/uL Final   RBC 05/22/2021 4.56  3.87 - 5.11 MIL/uL Final   Hemoglobin 05/22/2021 14.5  12.0 - 15.0 g/dL Final   HCT 21/30/8657 41.9  36.0 - 46.0 % Final   MCV 05/22/2021 91.9  80.0 - 100.0 fL Final   MCH 05/22/2021 31.8  26.0 - 34.0 pg Final   MCHC 05/22/2021 34.6  30.0 - 36.0 g/dL Final   RDW 84/69/6295 12.7  11.5 - 15.5 % Final   Platelets 05/22/2021 227  150 - 400 K/uL Final   nRBC 05/22/2021 0.0  0.0 - 0.2 % Final   Neutrophils Relative % 05/22/2021 52  % Final   Neutro Abs 05/22/2021 1.8   1.7 - 7.7 K/uL Final   Lymphocytes Relative 05/22/2021 37  % Final   Lymphs Abs 05/22/2021 1.3  0.7 - 4.0 K/uL Final   Monocytes Relative 05/22/2021 11  % Final   Monocytes Absolute 05/22/2021 0.4  0.1 - 1.0 K/uL Final   Eosinophils Relative 05/22/2021 0  % Final   Eosinophils Absolute 05/22/2021 0.0  0.0 - 0.5 K/uL Final   Basophils Relative 05/22/2021 0  % Final   Basophils Absolute 05/22/2021 0.0  0.0 - 0.1 K/uL Final   Immature Granulocytes 05/22/2021 0  % Final   Abs Immature Granulocytes 05/22/2021 0.01  0.00 - 0.07 K/uL Final   Performed at [facility], [address], [place], [place] [address]   Acetaminophen (Tylenol), Serum 05/22/2021 <10 (L)  10 - 30 ug/mL Final   Comment: (NOTE)  Therapeutic concentrations vary significantly. A range of 10-30 ug/mL  may be an effective concentration for many patients. However, some  are best treated at concentrations outside of this range. Acetaminophen concentrations >150 ug/mL at 4 hours after ingestion  and >50 ug/mL at 12 hours after ingestion are often associated with  toxic reactions.  Performed at [facility], [address], [place], [place] [address]    Salicylate Lvl 05/22/2021 <7.0 (L)  7.0 - 30.0 mg/dL Final   Performed at [facility], [address], [place], [place] [address]   Preg Test, Ur 05/22/2021 NEGATIVE  NEGATIVE Final   Comment:        THE SENSITIVITY OF THIS METHODOLOGY IS >20 mIU/mL. Performed at [facility], [address] [place], [place] [address]     Allergies: Latex and Tramadol  PTA Medications: (Not in a hospital admission)   Medical Decision Making  Based on my evaluation of the patient, patient meets criteria for admission to [facility] for continuous observation due to expressing suicidal thoughts with a plan and being unable to contract for safety.  Admission labs to be ordered and initiated before admitting patient to the  floor.  Patient to be reassessed by psychiatric provider the following morning.  Recommendations  Based on my evaluation the patient does not appear to have an emergency medical condition.  Meta Hatchet, PA 10/10/21  2:32 AM

## 2021-10-10 NOTE — ED Provider Notes (Signed)
FBC/OBS ASAP Discharge Summary  Date and Time: 10/10/2021 11:35 AM  Name: Barbara Gordon  MRN:  [id number]   Discharge Diagnoses:  Final diagnoses:  Suicidal ideation    Subjective: Barbara Gordon reported " Can I just stay here?"  Stay Summary: Barbara Gordon seen and evaluated face-to-face.  She denied suicidal thoughts currently.  States" kind of I am suicidal" she denied plan or intent.  Denies auditory or visual hallucinations.  Patient reports her main stressors is homelessness.  States she is not able to reside at her friend's house as they had requested that she leaves her home on yesterday.    Reports she was restarted on Risperdal  and has been taking medications as directed.  Denies that she has follow-up provider at this time.  Discussed following up with walk-in hours and/or family services of the [place].  We will make additional outpatient resources available.  Support, encouragement and reassurance was provided.  Per admission assessment note:Barbara Gordon is a 30 year old female who presents voluntary and unaccompanied to [facility]. Per chart pt was discharged from [facility] today (10/09/2021) with recommendations to follow up with outpatient resources. Clinician asked the pt, "what brought you to the hospital?" Pt reports, she's suicidal with a plan of overdosing on her Risperdal. Pt reports, she's homeless; today she came home her friend didn't want her there anymore, pt has nowhere to go. Pt reports, she just started hearing a voice telling her saying it will kill her family for about a week. Pt denies, HI, self-injurious behaviors and access to weapons.   Total Time spent with patient: 15 minutes  Past Psychiatric History:  Past Medical History: History reviewed. No pertinent past medical history. History reviewed. No pertinent surgical history. Family History: History reviewed. No pertinent family history. Family Psychiatric History:  Social History:  Social History   Substance and Sexual  Activity  Alcohol Use None     Social History   Substance and Sexual Activity  Drug Use Never    Social History   Socioeconomic History   Marital status: Single    Spouse name: Not on file   Number of children: Not on file   Years of education: Not on file   Highest education level: Not on file  Occupational History   Not on file  Tobacco Use   Smoking status: Some Days    Types: Cigarettes   Smokeless tobacco: Never  Substance and Sexual Activity   Alcohol use: Not on file   Drug use: Never   Sexual activity: Never  Other Topics Concern   Not on file  Social History Narrative   Not on file   Social Determinants of Health   Financial Resource Strain: Not on file  Food Insecurity: Not on file  Transportation Needs: Not on file  Physical Activity: Not on file  Stress: Not on file  Social Connections: Not on file   SDOH:  SDOH Screenings   Alcohol Screen: Not on file  Depression (PHQ2-9): Not on file  Financial Resource Strain: Not on file  Food Insecurity: Not on file  Housing: Not on file  Physical Activity: Not on file  Social Connections: Not on file  Stress: Not on file  Tobacco Use: High Risk   Smoking Tobacco Use: Some Days   Smokeless Tobacco Use: Never   Passive Exposure: Not on file  Transportation Needs: Not on file    Tobacco Cessation:  N/A, patient does not currently use tobacco products  Current Medications:  Current  Facility-Administered Medications  Medication Dose Route Frequency Provider Last Rate Last Admin   acetaminophen (TYLENOL) tablet 650 mg  650 mg Oral Q6H PRN Nwoko, Uchenna E, PA       alum & mag hydroxide-simeth (MAALOX/MYLANTA) 200-200-20 MG/5ML suspension 30 mL  30 mL Oral Q4H PRN Nwoko, Uchenna E, PA       hydrOXYzine (ATARAX) tablet 25 mg  25 mg Oral TID PRN Nwoko, Uchenna E, PA       magnesium hydroxide (MILK OF MAGNESIA) suspension 30 mL  30 mL Oral Daily PRN Nwoko, Uchenna E, PA       traZODone (DESYREL) tablet 50 mg   50 mg Oral QHS PRN Nwoko, Uchenna E, PA       Current Outpatient Medications  Medication Sig Dispense Refill   risperiDONE (RISPERDAL) 2 MG tablet Take 2 mg by mouth at bedtime.      PTA Medications: (Not in a hospital admission)   Musculoskeletal  Strength & Muscle Tone: within normal limits Gait & Station: normal Patient leans: N/A  Psychiatric Specialty Exam  Presentation  General Appearance: Appropriate for Environment; Casual  Eye Contact:Fair  Speech:Clear and Coherent; Slow  Speech Volume:Normal  Handedness:Right   Mood and Affect  Mood:Euthymic  Affect:Flat   Thought Process  Thought Processes:Coherent; Linear  Descriptions of Associations:Intact  Orientation:Partial  Thought Content:WDL  Diagnosis of Schizophrenia or Schizoaffective disorder in past: No  Duration of Psychotic Symptoms: Less than six months   Hallucinations:Hallucinations: None  Ideas of Reference:None  Suicidal Thoughts:Suicidal Thoughts: Yes, Passive SI Passive Intent and/or Plan: With Intent; With Plan  Homicidal Thoughts:Homicidal Thoughts: No   Sensorium  Memory:Immediate Fair; Recent Fair; Remote Fair  Judgment:Fair  Insight:Lacking   Executive Functions  Concentration:Fair  Attention Span:Fair; Good  Recall:Fair  Fund of Knowledge:Fair  Language:Good   Psychomotor Activity  Psychomotor Activity:Psychomotor Activity: Normal   Assets  Assets:Desire for Improvement   Sleep  Sleep:Sleep: Poor Number of Hours of Sleep: 2   Nutritional Assessment (For OBS and FBC admissions only) Has the patient had a weight loss or gain of 10 pounds or more in the last 3 months?: No Has the patient had a decrease in food intake/or appetite?: No Does the patient have dental problems?: No Does the patient have eating habits or behaviors that may be indicators of an eating disorder including binging or inducing vomiting?: No Has the patient recently lost weight  without trying?: 0 Has the patient been eating poorly because of a decreased appetite?: 0 Malnutrition Screening Tool Score: 0    Physical Exam  Physical Exam Vitals reviewed.  Cardiovascular:     Rate and Rhythm: Normal rate and regular rhythm.  Neurological:     Mental Status: She is oriented to person, place, and time.  Psychiatric:        Attention and Perception: Attention normal.        Mood and Affect: Mood normal.        Speech: Speech normal.        Thought Content: Thought content normal.        Cognition and Memory: Cognition normal.        Judgment: Judgment normal.   Review of Systems  Cardiovascular: Negative.   Psychiatric/Behavioral:  Positive for depression. The patient is nervous/anxious.   All other systems reviewed and are negative. Blood pressure 108/81, pulse 86, temperature 98.9 F (37.2 C), temperature source Oral, resp. rate 18, SpO2 100 %. There is no height or weight on  file to calculate BMI.  Demographic Factors:  Caucasian and Low socioeconomic status  Loss Factors: Decrease in vocational status and Financial problems/change in socioeconomic status  Historical Factors: Family history of mental illness or substance abuse  Risk Reduction Factors:   Positive therapeutic relationship  Continued Clinical Symptoms:  Depression:   Hopelessness  Cognitive Features That Contribute To Risk:  Closed-mindedness    Suicide Risk:  Minimal: No identifiable suicidal ideation.  Patients presenting with no risk factors but with morbid ruminations; may be classified as minimal risk based on the severity of the depressive symptoms  Plan Of Care/Follow-up recommendations:  Activity:  as tolerated  Diet:  heart healthy  Disposition: Take all medications as prescribed. Keep all follow-up appointments as scheduled.  Do not consume alcohol or use illegal drugs while on prescription medications. Report any adverse effects from your medications to your  primary care provider promptly.  In the event of recurrent symptoms or worsening symptoms, call 911, a crisis hotline, or go to the nearest emergency department for evaluation.    Oneta Rack, NP 10/10/2021, 11:35 AM

## 2021-10-15 ENCOUNTER — Other Ambulatory Visit: Payer: Self-pay

## 2021-10-15 ENCOUNTER — Encounter (HOSPITAL_COMMUNITY): Payer: Self-pay | Admitting: Emergency Medicine

## 2021-10-15 ENCOUNTER — Emergency Department (HOSPITAL_COMMUNITY)
Admission: EM | Admit: 2021-10-15 | Discharge: 2021-10-17 | Disposition: A | Discharge status: Home / Self Care | Payer: Medicaid Other | Attending: Emergency Medicine | Admitting: Emergency Medicine

## 2021-10-15 DIAGNOSIS — Z91040 Latex allergy status: Secondary | ICD-10-CM | POA: Insufficient documentation

## 2021-10-15 DIAGNOSIS — T69021A Immersion foot, right foot, initial encounter: Secondary | ICD-10-CM | POA: Insufficient documentation

## 2021-10-15 DIAGNOSIS — X31XXXA Exposure to excessive natural cold, initial encounter: Secondary | ICD-10-CM | POA: Insufficient documentation

## 2021-10-15 DIAGNOSIS — F331 Major depressive disorder, recurrent, moderate: Secondary | ICD-10-CM | POA: Insufficient documentation

## 2021-10-15 DIAGNOSIS — Z5900 Homelessness unspecified: Secondary | ICD-10-CM | POA: Insufficient documentation

## 2021-10-15 DIAGNOSIS — F17210 Nicotine dependence, cigarettes, uncomplicated: Secondary | ICD-10-CM | POA: Insufficient documentation

## 2021-10-15 DIAGNOSIS — Y900 Blood alcohol level of less than 20 mg/100 ml: Secondary | ICD-10-CM | POA: Insufficient documentation

## 2021-10-15 DIAGNOSIS — T69022A Immersion foot, left foot, initial encounter: Secondary | ICD-10-CM | POA: Insufficient documentation

## 2021-10-15 DIAGNOSIS — R45851 Suicidal ideations: Secondary | ICD-10-CM

## 2021-10-15 DIAGNOSIS — T69029A Immersion foot, unspecified foot, initial encounter: Secondary | ICD-10-CM

## 2021-10-15 DIAGNOSIS — Z79899 Other long term (current) drug therapy: Secondary | ICD-10-CM | POA: Insufficient documentation

## 2021-10-15 DIAGNOSIS — Z20822 Contact with and (suspected) exposure to covid-19: Secondary | ICD-10-CM | POA: Insufficient documentation

## 2021-10-15 NOTE — ED Provider Notes (Signed)
Emergency Medicine Provider Triage Evaluation Note  Barbara Gordon , a 30 y.o. female  was evaluated in triage.  Pt complains of pain in her left foot. Patient is wet, socks and shoes are wet. Patient provides little history.  Review of Systems  Positive: Foot pain Negative: injury  Physical Exam  BP (!) 141/94 (BP Location: Right Arm)   Pulse (!) 107   Temp 98.6 F (37 C) (Oral)   Resp 18   SpO2 97%  Gen:   Awake, provides little history and constantly says "ouch" Resp:  Normal effort  MSK:   Moves extremities without difficulty  Other:  Wet socks removed, imaged as below. Feet are warm to the touch, sensation intact. DP pulses present.        Medical Decision Making  Medically screening exam initiated at 10:33 PM.  Appropriate orders placed.  Ipek Sandusky was informed that the remainder of the evaluation will be completed by another provider, this initial triage assessment does not replace that evaluation, and the importance of remaining in the ED until their evaluation is complete.    Jeannie Fend, PA-C 10/15/21 2241    Gerhard Munch, MD 10/16/21 [phone]

## 2021-10-15 NOTE — ED Triage Notes (Signed)
Patient with sudden left foot pain.  Patient foot is wet, blistery and peeling.  CSMT's are intact. Upon inspection of right foot, it looks the same as the left.  Boots and socks are wet.

## 2021-10-16 ENCOUNTER — Telehealth (HOSPITAL_COMMUNITY): Payer: Self-pay

## 2021-10-16 LAB — CBC WITH DIFFERENTIAL/PLATELET
Abs Immature Granulocytes: 0.03 10*3/uL (ref 0.00–0.07)
Basophils Absolute: 0.1 10*3/uL (ref 0.0–0.1)
Basophils Relative: 1 %
Eosinophils Absolute: 0.3 10*3/uL (ref 0.0–0.5)
Eosinophils Relative: 5 %
HCT: 39.5 % (ref 36.0–46.0)
Hemoglobin: 13.9 g/dL (ref 12.0–15.0)
Immature Granulocytes: 0 %
Lymphocytes Relative: 29 %
Lymphs Abs: 2 10*3/uL (ref 0.7–4.0)
MCH: 32.8 pg (ref 26.0–34.0)
MCHC: 35.2 g/dL (ref 30.0–36.0)
MCV: 93.2 fL (ref 80.0–100.0)
Monocytes Absolute: 0.4 10*3/uL (ref 0.1–1.0)
Monocytes Relative: 6 %
Neutro Abs: 4.1 10*3/uL (ref 1.7–7.7)
Neutrophils Relative %: 59 %
Platelets: 319 10*3/uL (ref 150–400)
RBC: 4.24 MIL/uL (ref 3.87–5.11)
RDW: 12.7 % (ref 11.5–15.5)
WBC: 7 10*3/uL (ref 4.0–10.5)
nRBC: 0 % (ref 0.0–0.2)

## 2021-10-16 LAB — COMPREHENSIVE METABOLIC PANEL
ALT: 40 U/L (ref 0–44)
AST: 43 U/L — ABNORMAL HIGH (ref 15–41)
Albumin: 4.2 g/dL (ref 3.5–5.0)
Alkaline Phosphatase: 44 U/L (ref 38–126)
Anion gap: 10 (ref 5–15)
BUN: 8 mg/dL (ref 6–20)
CO2: 23 mmol/L (ref 22–32)
Calcium: 9.1 mg/dL (ref 8.9–10.3)
Chloride: 102 mmol/L (ref 98–111)
Creatinine, Ser: 0.7 mg/dL (ref 0.44–1.00)
GFR, Estimated: >60 mL/min (ref >60)
Glucose, Bld: 78 mg/dL (ref 70–99)
Potassium: 3.7 mmol/L (ref 3.5–5.1)
Sodium: 135 mmol/L (ref 135–145)
Total Bilirubin: 1.4 mg/dL — ABNORMAL HIGH (ref 0.3–1.2)
Total Protein: 7 g/dL (ref 6.5–8.1)

## 2021-10-16 LAB — RESP PANEL BY RT-PCR (FLU A&B, COVID) ARPGX2
Influenza A by PCR: NEGATIVE
Influenza B by PCR: NEGATIVE
SARS Coronavirus 2 by RT PCR: NEGATIVE

## 2021-10-16 LAB — LACTIC ACID, PLASMA: Lactic Acid, Venous: 0.7 mmol/L (ref 0.5–1.9)

## 2021-10-16 LAB — I-STAT BETA HCG BLOOD, ED (MC, WL, AP ONLY): I-stat hCG, quantitative: <5 m[IU]/mL (ref <5)

## 2021-10-16 LAB — ETHANOL: Alcohol, Ethyl (B): <10 mg/dL (ref <10)

## 2021-10-16 MED ORDER — ACETAMINOPHEN 325 MG PO TABS: 650.0000 mg | ORAL_TABLET | Freq: Once | ORAL | Status: DC

## 2021-10-16 MED ORDER — LACTATED RINGERS IV BOLUS: 1000.0000 mL | Freq: Once | INTRAVENOUS | Status: DC

## 2021-10-16 MED ORDER — AMITRIPTYLINE HCL 50 MG PO TABS
50.0000 mg | ORAL_TABLET | ORAL | Status: AC
  Filled 2021-10-16: qty 1

## 2021-10-16 MED ORDER — KETOROLAC TROMETHAMINE 30 MG/ML IJ SOLN: 30.0000 mg | Freq: Once | INTRAMUSCULAR | Status: DC

## 2021-10-16 NOTE — ED Notes (Signed)
Pt refused blood work  

## 2021-10-16 NOTE — ED Notes (Signed)
Pt refused COVID swab

## 2021-10-16 NOTE — ED Notes (Signed)
Attempted to start IV and obtain lab work.  Explained plan of care to patient.  Patient drowsy and will not let RN start IV at this time.  Will attempt again

## 2021-10-16 NOTE — ED Provider Notes (Signed)
  Physical Exam  BP 115/78 (BP Location: Right Arm)   Pulse (!) 104   Temp 98.6 F (37 C) (Oral)   Resp 20   SpO2 100%   Physical Exam  ED Course/Procedures     Procedures  MDM   Received care of patient from Dr. Preston Fleeting.  Please see his note for prior history, physical and care.  But he is a 30 year old female who presents with bilateral foot pain.  Has blisters and none freezing cold injury related to overuse, walking in the rain.  Do not suspect infection, feel she would benefit from rest/dry shoes, local wound care and podiatry follow up. Reports fatigue, generalized weakness. Labs without acute abnormalities.  Medically cleared.   She reports some SI without a plan.  Will place TTS consult.  Voluntary.       Alvira Monday, MD 10/16/21 1029

## 2021-10-16 NOTE — BH Assessment (Signed)
Care Management [facility] Discharge Follow Up   Writer attempted to make contact with patient today and was unsuccessful.  There is not a phone number listed in epic.   Per chart review, patient was provided with outpatient resources and homeless shelter resources.

## 2021-10-16 NOTE — ED Notes (Signed)
Pt wanded.  Belongings in [place] 6

## 2021-10-16 NOTE — ED Provider Notes (Signed)
[facility] EMERGENCY DEPARTMENT Provider Note   CSN: [id number] Arrival date & time: 10/15/21  2221     History Chief Complaint  Patient presents with   Foot Pain    Barbara Gordon is a 30 y.o. female.  The history is provided by the patient and medical records. The history is limited by the condition of the patient (Psychiatric disorder).  Foot Pain She has history of polysubstance abuse and presented complaining of left foot pain.  At triage, she was noted to have wet socks on and both feet were noted to be tender and with blistering.  Patient is not able to give any history whatsoever, just states "do not hurt me".   History reviewed. No pertinent past medical history.  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 No pertinent surgical history.   OB History   No obstetric history on file.     History reviewed. No pertinent family history.  Social History   Tobacco Use   Smoking status: Some Days    Types: Cigarettes   Smokeless tobacco: Never  Substance Use Topics   Drug use: Never    Home Medications Prior to Admission medications   Medication Sig Start Date End Date Taking? Authorizing Provider  risperiDONE (RISPERDAL) 2 MG tablet Take 2 mg by mouth at bedtime. 09/29/21   [provider]    Allergies    Latex and Tramadol  Review of Systems   Review of Systems  Unable to perform ROS: Psychiatric disorder   Physical Exam Updated Vital Signs BP 115/78 (BP Location: Right Arm)   Pulse (!) 104   Temp 98.6 F (37 C) (Oral)   Resp 20   SpO2 100%   Physical Exam Vitals and nursing note reviewed.  30 year old female, resting comfortably and in no acute distress. Vital signs are significant for borderline elevated heart rate. Oxygen saturation is 100%, which is normal. Head is  normocephalic and atraumatic. PERRLA, EOMI. Oropharynx is clear. Neck is nontender and supple without adenopathy or JVD. Back is nontender and there is no CVA tenderness. Lungs are clear without rales, wheezes, or rhonchi. Chest is nontender. Heart has regular rate and rhythm without murmur. Abdomen is soft, flat, nontender without masses or hepatosplenomegaly and peristalsis is normoactive. Extremities: Feet have pallor with some desquamation on the plantar surfaces, generalized erythema of the rest of the feet which are exquisitely tender to palpation. Skin is warm and dry without rash. Neurologic: Awake but will not answer questions, cranial nerves are intact, moves all extremities equally.   ED Results / Procedures / Treatments   Labs (all labs ordered are listed, but only abnormal results are displayed) Labs Reviewed  COMPREHENSIVE METABOLIC PANEL  CBC WITH DIFFERENTIAL/PLATELET  ETHANOL  RAPID URINE DRUG SCREEN, HOSP PERFORMED  LACTIC ACID, PLASMA  I-STAT BETA HCG BLOOD, ED ([facility], [facility], [facility] ONLY)    Procedures Procedures   Medications Ordered in ED Medications  ketorolac (TORADOL) 30 MG/ML injection 30 mg (30 mg Intravenous Patient Refused/Not Given 10/16/21 0727)  amitriptyline (ELAVIL) tablet 50 mg (50 mg Oral Patient Refused/Not Given 10/16/21 0727)  acetaminophen (TYLENOL) tablet 650 mg (650 mg Oral Patient Refused/Not Given 10/16/21 0727)  lactated ringers bolus 1,000 mL (1,000 mLs Intravenous Patient Refused/Not Given 10/16/21 9924)    ED Course  I have reviewed the triage vital  signs and the nursing notes.  Pertinent lab results that were available during my care of the patient were reviewed by me and considered in my medical decision making (see chart for details).   MDM Rules/Calculators/A&P                         Bilateral foot pain which appears to be none freezing cold injury.  No areas of gangrene identified.  Old records are reviewed showing numerous ED visits  for psychiatric and substance abuse related issues.  We will check screening labs and give ketorolac and acetaminophen for pain and start on amitriptyline.  With her history of substance abuse issues, we will try to avoid narcotics.  Because of bizarre mental status, anticipate need for psychiatric evaluation once medically cleared.  Labs are still pending.  Case is signed out to Dr. Dalene Seltzer.  Final Clinical Impression(s) / ED Diagnoses Final diagnoses:  Trench foot, unspecified laterality, initial encounter    Rx / DC Orders ED Discharge Orders     None        Dione Booze, MD 10/16/21 [phone]

## 2021-10-17 ENCOUNTER — Other Ambulatory Visit: Payer: Self-pay

## 2021-10-17 ENCOUNTER — Emergency Department (HOSPITAL_COMMUNITY)
Admission: EM | Admit: 2021-10-17 | Discharge: 2021-10-17 | Disposition: A | Discharge status: Home / Self Care | Payer: Medicaid Other | Attending: Emergency Medicine | Admitting: Emergency Medicine

## 2021-10-17 DIAGNOSIS — Z046 Encounter for general psychiatric examination, requested by authority: Secondary | ICD-10-CM | POA: Insufficient documentation

## 2021-10-17 DIAGNOSIS — R45851 Suicidal ideations: Secondary | ICD-10-CM | POA: Insufficient documentation

## 2021-10-17 NOTE — ED Provider Notes (Signed)
Patient slept all night without any difficulty.  She has been seen by behavioral health.  At this point, patient appears to be a very minimal threat to herself.  She has no plan to harm her self.  Patient was noted to have trench foot, has been referred to podiatry.  I have also given her referrals to local clinics.  She will be discharged BP 116/68   Pulse 71   Temp 98.5 F (36.9 C) (Oral)   Resp 20   SpO2 98%     Zadie Rhine, MD 10/17/21 [phone]

## 2021-10-17 NOTE — ED Notes (Signed)
Patient very upset doesn't  want to leave, states she would rather go to jail. Patient was escorted out by security and [employer].

## 2021-10-17 NOTE — Discharge Instructions (Addendum)
As we discussed, you were just cleared and discharged from [facility] one hour before arriving here. I have provided resources for shelters, transportation, and counseling and substance abuse services. I recommend that you follow up with the appropriate services as we discussed.

## 2021-10-17 NOTE — ED Provider Notes (Signed)
[place] DEPT Provider Note   CSN: [id number] Arrival date & time: 10/17/21  [id number]     History Chief Complaint  Patient presents with   Suicidal    Barbara Gordon is a 30 y.o. female with a past medical history of suicidal ideation, substance abuse, substance abuse induced psychosis who presents 1 hour after being discharged from [facility] with continued complaint of suicidal ideation.  Patient denies any concrete plan to harm herself. Patient endorsed "plan" of taking all of her prescription pills in triage, however when pressed patient does not know which pills she would be taking, does not have any pills to take.  Patient was seen and evaluated by TTS, and deemed to be low risk to herself at this time by their estimation.  When confronted with this information patient requesting detox services for alcohol, marijuana, however has not used these substances in over 24 hours that she has been inside our facilities since yesterday.  Patient without any new complaint.  Patient then proceeds to ask whether she can be arrested that she thinks she might have a warrant out. Denies any pain at this time.  HPI     No past medical history on file.  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   OB History   No obstetric history on file.     No family history on file.  Social History   Tobacco Use   Smoking status: Some Days    Types: Cigarettes   Smokeless tobacco: Never  Substance Use Topics   Drug use: Never    Home Medications Prior to Admission medications   Not on File    Allergies    Latex and Tramadol  Review of Systems   Review of Systems  Psychiatric/Behavioral:  Positive for suicidal ideas.   All other systems reviewed and are negative.  Physical Exam Updated Vital  Signs Ht 5\' 1"  (1.549 m)   LMP 10/05/2021   BMI 17.06 kg/m   Physical Exam Vitals and nursing note reviewed.  Constitutional:      General: She is not in acute distress.    Comments: Anxious appearing, disheveled clothing.  HENT:     Head: Normocephalic and atraumatic.  Eyes:     General:        Right eye: No discharge.        Left eye: No discharge.  Cardiovascular:     Rate and Rhythm: Normal rate and regular rhythm.  Pulmonary:     Effort: Pulmonary effort is normal. No respiratory distress.  Musculoskeletal:        General: No deformity.  Skin:    General: Skin is warm and dry.  Neurological:     Mental Status: She is alert and oriented to person, place, and time.  Psychiatric:        Mood and Affect: Mood normal.        Behavior: Behavior normal.    ED Results / Procedures / Treatments   Labs (all labs ordered are listed, but only abnormal results are displayed) Labs Reviewed - No data to display  EKG None  Radiology No results found.  Procedures Procedures   Medications Ordered in ED Medications - No data to display  ED Course  I have reviewed the triage vital signs and the nursing notes.  Pertinent labs & imaging results that were available during my care of the patient were reviewed by me and considered in my medical decision making (see chart for details).    MDM Rules/Calculators/A&P                         I discussed this case with my attending physician who cosigned this note including patient's presenting symptoms, physical exam, and planned diagnostics and interventions. Attending physician stated agreement with plan or made changes to plan which were implemented.   Patient seen and evaluated at [facility] 1 hour prior to arrival, cleared by TTS for suicidal ideation without clear plan to harm herself.  Patient is struggling with homelessness, as well as mental health issues, and reports that she does not want to be on the street.  Patient was  provided resources on discharge from [facility], will provide resources to her again today.  Discussed if she thinks that she may have a warrant out for her arrest that she should take this up with the police.  Patient with no complaints of pain.  Patient with no clear plan for suicide at this time, does not have any pills to take although she endorses wanting to overdose on pills  Per previous provider note patient also struggling with some trench foot which she does not complain of or ask for further evaluation today.  Patient with resources to reach out to podiatry at her earliest convenience.  Patient stable at this time, do not believe that she is worse to herself per previous evaluation 1 hour prior to arrival.  Encouraged her to follow-up with behavioral health urgent care if she has new suicidal ideation with plan, as well as to reach out to shelters in the area, and arrange transportation to these facilities. Final Clinical Impression(s) / ED Diagnoses Final diagnoses:  Suicidal ideation    Rx / DC Orders ED Discharge Orders     None        [place] 10/17/21 3536    Terald Sleeper, MD 10/17/21 1007

## 2021-10-17 NOTE — Discharge Instructions (Signed)
Substance Abuse Treatment Programs ° °Intensive Outpatient Programs °[facility]     °[address]      °[place], [place]                   °[phone]      ° °[facility] °[address] °[place], [place] °[phone] ° °[facility]     °(Inpatient and outpatient)     °[address]           °[phone]   ° °[facility] °336-288-1484 (Suboxone and Methadone) ° °[address]      °[place], [place] [address]      °336-882-2125      ° °[address] °[place], [place] °852-3033 ° °[facility] (Outpatient/Inpatient, Chemical)    °(insurance only) [phone]      °       °[facility] (Groups & Residential) °[place], [place] °336-389-1413 ° °   °[facility]     °[address]     °[place], [place]      °[phone]      ° °[facility] (for caregivers and family) °[address] °[place], [place] °336-299-4655 ° ° ° ° ° °Residential Treatment Programs °[facility]      °[address], [place], [place] [address]  °([phone]      ° °[facility] °[address] [place], [place] [address] °[phone] ° °[facility]        °336-248-8914      ° °[facility] °1-800-659-3381 ° °[facility]             °[address]                                         °[place], [place]                                                °877-615-2722 or 336-784-9470                              ° °[facility] [place] °[address] °[place], [address] °1.877.941.8954 ° °[facility]    °[address]      °[place], [place]     °[phone]      ° °[facility] °[address] °[place], [place] °336-285-9073 ° °[facility]   °[address]     °[place], [place] [address]     °336-899-1550      °Admissions: 8am-3pm M-F ° °[facility] °[address] °[place] °336-227-7417 ° °[facility]: Residential Program (90 Days)   °[place], [place]      °336-725-8389 or 800-758-6077    ° °[facility] °[place], [place] °(Walk in Hours over the weekend or by referral) ° °[facility] °[address], [place],  [address] °(336) [phone] ° °Crisis Mobile: [facility]:  1-877-626-1772 (for crisis response 24 hours a day) °[facility] Hotline:      1-800-256-2452 °Outpatient Psychiatry and Counseling ° °[facility]: Mobile Crisis   Management 24 hours:  1-877-626-1772 ° °[facility] sliding scale fee and walk in schedule: M-F 8am-12pm/1pm-3pm °[address]  °[place], [place] [address] °[phone] ° °[facility] °[address] °[place], [place] [address] °336-703-9650 ° °[facility] ([facility])- new patient walk-in appointments available Monday - Friday 8am -3pm.          °[address] °[place], [place] [address] °336-676-6840 or crisis line- 336-676-6905 ° °[facility] Intensive Outpatient Therapy Program °[address] °[place], [place] [address] °336-832-9804 ° °[facility]                  °Crisis Services      °[phone]      °[address]     °[place], [place] [address]                ° °[facility]   °[facility] °[phone] °[address] °[place], [place] [address] ° ° °[facility]          °[address],  °[place], [place] [address]       °([phone] ° °[facility] °[address] °[place], [place] [address] °336-292-1510 ° °[facility]    °[address]    °[place], [place] [address]     °[phone]      ° °Parish McKinney, MD     °[address]     °[place] [address]     °336-282-1251     °  °[facility] °[address] °[place] [address] ° °[facility]     °[address]     °[place], [place]      °336-542-2076      ° °[facility] °Shamsher Ahluwalia, MD °[address] °[place], [place] [address] °336-420-9558 ° °[facility]     °[address]     °[place], [place] [address]     °336-274-1237      ° °[facility] °[address] °[place], [place] [address] °336-854-4450 °Resources for Temporary Residential Assistance/Crisis Centers ° °DAY CENTERS °[facility] °M-F 8am-3pm   °[address] [place], [place] [address]   336-332-0824 °Services include: laundry, barbering, support groups, case management, phone  & computer access, showers, AA/NA mtgs, mental health/substance abuse nurse, job skills class, disability information, VA assistance, spiritual classes, etc.  ° °HOMELESS SHELTERS ° °[facility]     °[facility]   °[address], [place]     °336.271.5959       °       °[facility] (women and children)       °[address] °[place], [place] [address] °336-275-0820 °[email or web address] for application and process °Application Required ° °[facility]   °[address]    °[place] [address]     °[phone]       °             °[facility] °[address] °[place], [place] [address] °336.273.5572 °336-235-0363(schedule application appt.) °Application Required ° °[facility] (women only)    °[address]     °[place], [place] [address]     °336-884-1039      °  Intake starts 6pm daily °Need valid ID, SSC, & Police report °[facility] [place] °[address] °[place], [place] °336-881-5420 °Application Required ° °[facility] (men only)     °[address]      °[place], [place]     °[phone]      ° °[facility] °(Pregnant women only) °[address] °[place], [place] °336-275-0206 ° °[facility]      °[address]      °[place], [place] [address]     °[phone]      °       °[facility] °[address] °[place], [place] °336-723-1848 °90 day commitment/SA/Application process ° °[facility](men only)     °[address]     °[place], [place]     °[phone]       °Check-in at 7pm     °       °[facility] °[address] °[place], [place] [address] °336-248-6684 °Men/Women/Women and Children must be there by 7 pm ° °[facility] °[place],  °336-722-8721                ° °

## 2021-10-17 NOTE — ED Notes (Signed)
ED Provider at bedside. 

## 2021-10-17 NOTE — ED Triage Notes (Signed)
Patient reports she has been feeling suicidal for a few days and plans to take all of her prescription pills. Endorses SI attempt in past, denies pain in triage.

## 2021-10-17 NOTE — ED Notes (Signed)
Provided bus pass per pt's request. DC instructions with resources given to patient, verbalized understanding.

## 2021-10-17 NOTE — BH Assessment (Addendum)
Comprehensive Clinical Assessment (CCA) Screening, Triage and Referral Note  10/17/2021 Barbara Gordon [id number] Disposition: Clinician discussed patient care with [name], NP.  She said that patient is psych cleared and can be discharged at the [facility] discretion.  Clinician informed RN Francine Graven and RN April Oakley via secure messaging.    Pt is annoyed at having to get up for assessment.  She wants to go somewhere she can stay for three days and get back on medications.  Pt is not responding to internal stimuli.  She does not evidence any delusional thought process.  Pt is clear and coherent in expressing herself.    Pt has no current outpatient providers.   Chief Complaint:  Chief Complaint  Patient presents with   Foot Pain   Visit Diagnosis: MDD recurrent, moderate; Homelessness  Patient Reported Information How did you hear about [name]? Legal System (Pt was brought to [facility] by a police officer.)  What Is the Reason for Your Visit/Call Today? Pt has been hearing a voice telling her it is going to kill her family and friends.  Pt has some SI but no plan.  Pt denies any HI.  Pt says she had a place to stay but she had been kicked out by the person she was staying with.  Pt says that she has "they keep telling me about homeless shelters but they are always full."  Pt was discharged from [facility] on [facility].  She was given resouces for shelters to follow up with.  Pt had been discharged from [facility] on 12/[facility] and was at [facility] for a day.  Pt now wants to stay at a psychiatric facilty for a few days "to get back on meiaions."  Pt does not feel safe being on her own.  How Long Has This Been Causing You Problems? 1 wk - 1 month  What Do You Feel Would Help You the Most Today? Treatment for Depression or other mood problem; Housing Assistance   Have You Recently Had Any Thoughts About Hurting Yourself? Yes  Are You Planning to Commit Suicide/Harm Yourself At This time? No   Have you Recently  Had Thoughts About Hurting Someone [name]? No  Are You Planning to Harm Someone at This Time? No  Explanation: No data recorded  Have You Used Any Alcohol or Drugs in the Past 24 Hours? No  How Long Ago Did You Use Drugs or Alcohol? No data recorded What Did You Use and How Much? Positive for THC per UDS   Do You Currently Have a Therapist/Psychiatrist? No  Name of Therapist/Psychiatrist: No data recorded  Have You Been Recently Discharged From Any Office Practice or Programs? No  Explanation of Discharge From Practice/Program: No data recorded   CCA Screening Triage Referral Assessment Type of Contact: Tele-Assessment  Telemedicine Service Delivery:   Is this Initial or Reassessment? Initial Assessment  Date Telepsych consult ordered in [facility]:  10/16/21  Time Telepsych consult ordered in [facility]:  0740  Location of Assessment: [facility] ED  Provider Location: [facility] Assessment Services   Collateral Involvement: None at this time   Does Patient Have a Court Appointed Legal Guardian? No data recorded Name and Contact of Legal Guardian: No data recorded If Minor and Not Living with Parent(s), Who has Custody? NA  Is CPS involved or ever been involved? Never  Is APS involved or ever been involved? Never   Patient Determined To Be At Risk for Harm To Self or Others Based on Review  of Patient Reported Information or Presenting Complaint? No  Method: No data recorded Availability of Means: No data recorded Intent: No data recorded Notification Required: No data recorded Additional Information for Danger to Others Potential: No data recorded Additional Comments for Danger to Others Potential: No data recorded Are There Guns or Other Weapons in Your Home? No data recorded Types of Guns/Weapons: No data recorded Are These Weapons Safely Secured?                            No data recorded Who Could Verify You Are Able To Have These Secured: No data recorded Do You Have any  Outstanding Charges, Pending Court Dates, Parole/Probation? No data recorded Contacted To Inform of Risk of Harm To Self or Others: Other: Comment (NA)   Does Patient Present under Involuntary Commitment? No  IVC Papers Initial File Date: 05/22/21   [place] of Residence: [place]   Patient Currently Receiving the Following Services: Not Receiving Services   Determination of Need: Urgent (48 hours)   Options For Referral: Other: Comment (Pt is psych cleared per [name], NP.)   Discharge Disposition:     Barbara Gordon, LCAS

## 2022-01-08 ENCOUNTER — Ambulatory Visit (HOSPITAL_COMMUNITY)
Admission: RE | Admit: 2022-01-08 | Discharge: 2022-01-08 | Disposition: A | Discharge status: Home / Self Care | Payer: No Payment, Other | Attending: Psychiatry | Admitting: Psychiatry

## 2022-01-08 ENCOUNTER — Emergency Department (HOSPITAL_COMMUNITY)
Admission: EM | Admit: 2022-01-08 | Discharge: 2022-01-09 | Disposition: A | Discharge status: Home / Self Care | Payer: Medicaid Other | Attending: Emergency Medicine | Admitting: Emergency Medicine

## 2022-01-08 DIAGNOSIS — R451 Restlessness and agitation: Secondary | ICD-10-CM | POA: Insufficient documentation

## 2022-01-08 DIAGNOSIS — F600 Paranoid personality disorder: Secondary | ICD-10-CM | POA: Insufficient documentation

## 2022-01-08 DIAGNOSIS — F411 Generalized anxiety disorder: Secondary | ICD-10-CM | POA: Insufficient documentation

## 2022-01-08 DIAGNOSIS — R000 Tachycardia, unspecified: Secondary | ICD-10-CM | POA: Insufficient documentation

## 2022-01-08 DIAGNOSIS — F1990 Other psychoactive substance use, unspecified, uncomplicated: Secondary | ICD-10-CM

## 2022-01-08 DIAGNOSIS — R4182 Altered mental status, unspecified: Secondary | ICD-10-CM | POA: Insufficient documentation

## 2022-01-08 DIAGNOSIS — Z91040 Latex allergy status: Secondary | ICD-10-CM | POA: Insufficient documentation

## 2022-01-08 DIAGNOSIS — F1910 Other psychoactive substance abuse, uncomplicated: Secondary | ICD-10-CM

## 2022-01-08 DIAGNOSIS — Z20822 Contact with and (suspected) exposure to covid-19: Secondary | ICD-10-CM | POA: Insufficient documentation

## 2022-01-08 DIAGNOSIS — R569 Unspecified convulsions: Secondary | ICD-10-CM | POA: Insufficient documentation

## 2022-01-08 LAB — CBC WITH DIFFERENTIAL/PLATELET
Abs Immature Granulocytes: 0.04 10*3/uL (ref 0.00–0.07)
Basophils Absolute: 0.1 10*3/uL (ref 0.0–0.1)
Basophils Relative: 1 %
Eosinophils Absolute: 0.1 10*3/uL (ref 0.0–0.5)
Eosinophils Relative: 1 %
HCT: 43.5 % (ref 36.0–46.0)
Hemoglobin: 15.3 g/dL — ABNORMAL HIGH (ref 12.0–15.0)
Immature Granulocytes: 0 %
Lymphocytes Relative: 24 %
Lymphs Abs: 2.2 10*3/uL (ref 0.7–4.0)
MCH: 32.9 pg (ref 26.0–34.0)
MCHC: 35.2 g/dL (ref 30.0–36.0)
MCV: 93.5 fL (ref 80.0–100.0)
Monocytes Absolute: 0.5 10*3/uL (ref 0.1–1.0)
Monocytes Relative: 5 %
Neutro Abs: 6.4 10*3/uL (ref 1.7–7.7)
Neutrophils Relative %: 69 %
Platelets: 352 10*3/uL (ref 150–400)
RBC: 4.65 MIL/uL (ref 3.87–5.11)
RDW: 12.5 % (ref 11.5–15.5)
WBC: 9.2 10*3/uL (ref 4.0–10.5)
nRBC: 0 % (ref 0.0–0.2)

## 2022-01-08 LAB — COMPREHENSIVE METABOLIC PANEL
ALT: 48 U/L — ABNORMAL HIGH (ref 0–44)
AST: 40 U/L (ref 15–41)
Albumin: 4.7 g/dL (ref 3.5–5.0)
Alkaline Phosphatase: 51 U/L (ref 38–126)
Anion gap: 12 (ref 5–15)
BUN: 8 mg/dL (ref 6–20)
CO2: 21 mmol/L — ABNORMAL LOW (ref 22–32)
Calcium: 9.6 mg/dL (ref 8.9–10.3)
Chloride: 102 mmol/L (ref 98–111)
Creatinine, Ser: 0.8 mg/dL (ref 0.44–1.00)
GFR, Estimated: >60 mL/min (ref >60)
Glucose, Bld: 145 mg/dL — ABNORMAL HIGH (ref 70–99)
Potassium: 4.2 mmol/L (ref 3.5–5.1)
Sodium: 135 mmol/L (ref 135–145)
Total Bilirubin: 0.7 mg/dL (ref 0.3–1.2)
Total Protein: 7.9 g/dL (ref 6.5–8.1)

## 2022-01-08 LAB — SALICYLATE LEVEL: Salicylate Lvl: <7 mg/dL — ABNORMAL LOW (ref 7.0–30.0)

## 2022-01-08 LAB — ACETAMINOPHEN LEVEL: Acetaminophen (Tylenol), Serum: <10 ug/mL — ABNORMAL LOW (ref 10–30)

## 2022-01-08 LAB — ETHANOL: Alcohol, Ethyl (B): <10 mg/dL (ref <10)

## 2022-01-08 MED ORDER — LORAZEPAM 2 MG/ML IJ SOLN
1.0000 mg | Freq: Once | INTRAMUSCULAR | Status: AC
  Administered 2022-01-08: 1 mg via INTRAVENOUS
  Filled 2022-01-08: qty 1

## 2022-01-08 NOTE — ED Provider Notes (Signed)
?[facility] EMERGENCY DEPARTMENT ?Provider Note ? ? ?CSN: [id number] ?Arrival date & time: 01/08/22  1906 ? ?  ? ?History ? ?Chief Complaint  ?Patient presents with  ? Seizures  ? ? ?Barbara Gordon is a 31 y.o. female. ? ?Patient is a 31 year old female with a history of polysubstance abuse with prior SI and multiple psychiatric evaluations most recently on 15 February who is presenting today after being transferred from behavioral health due to having altered mental status.  The nurse at behavioral health called and reported that she was brought in by gentleman who reported he did not know much about her but she did not seem to be acting right.  The nurse noted that patient was altered but then reports that she had a generalized seizure lasting 2 minutes that resolved spontaneously.  Here patient is pacing around the room muttering to herself.  She does respond to questions and denies a history of seizures.  She is very guarded and just keeps saying she does not know why everybody is in her room she does not want anybody to touch her she does not understand what is going on.  She keeps saying she needs to find a guy to get her things.  Attempted to redirect the patient explain where she was and what it happened but she just keeps murmuring to herself.  No further history was able to be obtained ? ?The history is provided by the patient, medical records and the EMS personnel.  ?Seizures ?Seizure activity on arrival: no   ?Seizure type:  Grand mal ? ?  ? ?Home Medications ?Prior to Admission medications   ?Not on File  ?   ? ?Allergies    ?Latex and Tramadol   ? ?Review of Systems   ?Review of Systems  ?Neurological:  Positive for seizures.  ? ?Physical Exam ?Updated Vital Signs ?BP 128/72   Pulse 96   Temp 98.7 ?F (37.1 ?C) (Oral)   Resp 14   SpO2 100%  ?Physical Exam ?Vitals and nursing note reviewed.  ?Constitutional:   ?   General: She is not in acute distress. ?   Appearance: She is  well-developed.  ?HENT:  ?   Head: Normocephalic and atraumatic.  ?Eyes:  ?   Conjunctiva/sclera: Conjunctivae normal.  ?   Pupils: Pupils are equal, round, and reactive to light.  ?Cardiovascular:  ?   Rate and Rhythm: Regular rhythm. Tachycardia present.  ?   Heart sounds: No murmur heard. ?Pulmonary:  ?   Effort: Pulmonary effort is normal. No respiratory distress.  ?   Breath sounds: Normal breath sounds. No wheezing or rales.  ?Abdominal:  ?   Comments: Patient refused to let me continue with exam  ?Musculoskeletal:     ?   General: No tenderness. Normal range of motion.  ?   Cervical back: Normal range of motion and neck supple.  ?   Right lower leg: No edema.  ?   Left lower leg: No edema.  ?Skin: ?   General: Skin is warm and dry.  ?   Findings: No erythema or rash.  ?Neurological:  ?   Mental Status: She is alert.  ?   Comments: Oriented to person.  Moving all extremities.  Walking without difficulty.  ?Psychiatric:  ?   Comments: Pacing around the room, murmuring to herself.  Unclear if she is responding to internal stimuli.  Appears agitated, hyperactive and paranoid  ? ? ?ED Results / Procedures /  Treatments   ?Labs ?(all labs ordered are listed, but only abnormal results are displayed) ?Labs Reviewed  ?CBC WITH DIFFERENTIAL/PLATELET - Abnormal; Notable for the following components:  ?    Result Value  ? Hemoglobin 15.3 (*)   ? All other components within normal limits  ?COMPREHENSIVE METABOLIC PANEL - Abnormal; Notable for the following components:  ? CO2 21 (*)   ? Glucose, Bld 145 (*)   ? ALT 48 (*)   ? All other components within normal limits  ?ACETAMINOPHEN LEVEL - Abnormal; Notable for the following components:  ? Acetaminophen (Tylenol), Serum <10 (*)   ? All other components within normal limits  ?SALICYLATE LEVEL - Abnormal; Notable for the following components:  ? Salicylate Lvl <7.0 (*)   ? All other components within normal limits  ?RESP PANEL BY RT-PCR (FLU A&B, COVID) ARPGX2  ?ETHANOL   ?RAPID URINE DRUG SCREEN, HOSP PERFORMED  ?I-STAT BETA HCG BLOOD, ED ([facility], [facility], [facility] ONLY)  ? ? ?EKG ?None ? ?Radiology ?No results found. ? ?Procedures ?Procedures  ? ? ?Medications Ordered in ED ?Medications  ?LORazepam (ATIVAN) injection 1 mg (1 mg Intravenous Given 01/08/22 1923)  ?LORazepam (ATIVAN) injection 1 mg (1 mg Intravenous Given 01/08/22 2004)  ? ? ?ED Course/ Medical Decision Making/ A&P ?  ?                        ?Medical Decision Making ?Amount and/or Complexity of Data Reviewed ?Independent Historian: EMS ?   Details: Provider at the behavioral health urgent care ?External Data Reviewed: notes. ?Labs: ordered. Decision-making details documented in ED Course. ? ?Risk ?Prescription drug management. ? ? ?Patient is a 31 year old female presenting today with altered mental status.  Patient acute peers agitated and paranoid.  Based on review of external medical records from the [facility] a few weeks ago patient arrived there acting very similar.  It was thought to be related to polysubstance use.  However it was reported that patient had seizure-like activity that lasted 2 minutes while she was at behavioral health urgent care.  She was sent here for further evaluation.  Patient is not displaying any postictal symptoms at this time but she is very guarded and seems to be responding to internal stimuli.  Discussed with the patient the need to make sure she was medically cleared.  She did consent to receive Ativan.  In the past she has reported to drink heavily unclear if the seizure was related to alcohol withdrawal as patient is not able to tell me when she last had a drink.  Will get blood and ensure no evidence of ingestion as patient has had prior SI.  We will continue to monitor.  No injury with the seizure-like activity while at behavioral health urgent care. ? ?9:49 PM ?Patient is now more calm with Ativan.  Heart rate has improved to less than 100 and blood pressure is normal.  Patient is  still having agitation and paranoia.  Suspect that the possible seizure activity today was due to polysubstance abuse.  She is not displaying evidence of alcohol withdrawal at this time.  I independently interpreted patient's labs and her CBC is normal, CMP is normal, EtOH, acetaminophen and salicylates are negative.  UDS is still pending however feel that patient will need psychiatric evaluation. ? ? ? ? ? ? ? ? ?Final Clinical Impression(s) / ED Diagnoses ?Final diagnoses:  ?None  ? ? ?Rx / DC Orders ?ED  Discharge Orders   ? ? None  ? ?  ? ? ?  ?Gwyneth Sprout, MD ?01/08/22 2151 ? ?

## 2022-01-08 NOTE — ED Notes (Signed)
Pt's belongings inventoried and placed in [facility] locker #12.  ?

## 2022-01-08 NOTE — ED Notes (Signed)
Barbara Gordon  [id number] friend called ?

## 2022-01-08 NOTE — ED Notes (Signed)
Pt seen by staff walking into other exam rooms, confused and stating that she is in the correct room. Walked back into her room by this Therapist, sports.  ?

## 2022-01-08 NOTE — ED Notes (Signed)
Apple juice given to pt  

## 2022-01-08 NOTE — H&P (Addendum)
Behavioral Health Medical Screening Exam ? ?Barbara Gordon is an 31 y.o. female who presents to [facility] as a walk-in.  She is accompanied by a unidentified female.  He states he barely knows patient that he met her on the street corner a moth ago.  He states she needs help and that she may have ingested something. Per chart review patient has a history of SI and Substance abuse. The female accompanying her said she was homeless ? ?Upon assessment patient is alert and confused.  She is unable to answer any questions and she began to have seizure-like activity.  She began foaming at the mouth and jerking.  911 was called.  Seizure like activity was timed it lasted for 2 minutes.  Patient was unresponsive.  Patient safety was maintained. Of note: once patient started seizing her female friend stood up and stated "I cant watch this shit" and he left.  ? ?Report was called to [facility] emergency department due to neurological symptoms/seizure-like activity.  Dr. Maryan Rued accepting physician. ? ?However when EMS arrived they were told to take patient to [facility], EMS stated no, they are taking her to [facility] because they have her history. ? ?[facility] was notified, however EMS had already left with patient. Notified Agricultural consultant at [employer] that EMS stated they were taking pt to [facility], and was told patient was en route to [facility].  ? ? ?Total Time spent with patient: 15 minutes ? ?Psychiatric Specialty Exam: ?Physical Exam ?Constitutional:   ?   General: She is in acute distress.  ?   Appearance: She is diaphoretic.  ?Eyes:  ?   General:     ?   Right eye: No discharge.     ?   Left eye: No discharge.  ?Musculoskeletal:     ?   General: Normal range of motion.  ?   Cervical back: Normal range of motion.  ?Skin: ?   Comments: Diaphoretic   ?Neurological:  ?   Mental Status: She is disoriented.  ?Psychiatric:  ?   Comments: Unable to perfrom patient began to have seizure, 911 called   ? ?Review of Systems  ?Unable to perform ROS: Acuity of  condition  ?There were no vitals taken for this visit.There is no height or weight on file to calculate BMI. ?General Appearance: Disheveled ?Eye Contact:  Minimal ?Speech:  Slow ?Volume:  Decreased ?Mood:  Dysphoric ?Affect:   disoriented  ?Thought Process:  NA ?Orientation:  Other:  confused  ?Thought Content:   unable to access  ?Suicidal Thoughts:  unable to access  ?Homicidal Thoughts:  unable to access  ?Memory:  Immediate;   Poor ?Recent;   Poor ?Remote;   Poor ?Judgement:  unable to access  ?Insight:  unable to access  ?Psychomotor Activity:   seizure like activity  ?Concentration: Concentration: Poor and Attention Span: Poor ?Recall:  Poor ?Fund of Knowledge:Poor ?Language: Poor ?Akathisia:  No ?Handed:  Right ?AIMS (if indicated):    ?Assets:  Others:    unable to access  ?Sleep:    ? ?Musculoskeletal: ?Strength & Muscle Tone:  appeared normal but pt started having seizure like activity  ?Gait & Station: unable to access  ?Patient leans: unable to access  ? ?There were no vitals taken for this visit. ? ?Recommendations: ?Based on my evaluation the patient appears to have an emergency medical condition for which I recommend the patient be transferred to the emergency department for further evaluation. ? ?Report was called to  [facility] emergency department due to neurological symptoms/seizure-like activity.  Dr. Maryan Rued accepting physician. ? ?However when EMS arrived they were told to take patient to [facility], EMS stated no, they are taking her to [facility] because they have her history. ? ?[facility] was notified, however EMS had already left with patient.  Dr. Maryan Rued notified that EMS stated they were taking pt to [facility].  ? ?Revonda Humphrey, NP ?01/08/2022, 6:49 PM ? ?

## 2022-01-08 NOTE — ED Notes (Signed)
Pt changed into burgundy scrubs by ED staff.  ?

## 2022-01-08 NOTE — ED Notes (Signed)
Warm blankets laid across pt.  ?

## 2022-01-08 NOTE — ED Triage Notes (Signed)
Pt bib EMS from [facility] for possible seizure activity. No known seizure hx. Pt very agitated and nervous in room. Unable to answer many questions with this RN.  ?

## 2022-01-09 ENCOUNTER — Ambulatory Visit (HOSPITAL_COMMUNITY)
Admission: RE | Admit: 2022-01-09 | Discharge: 2022-01-09 | Disposition: A | Discharge status: Home / Self Care | Payer: No Payment, Other | Attending: Psychiatry | Admitting: Psychiatry

## 2022-01-09 ENCOUNTER — Other Ambulatory Visit: Payer: Self-pay

## 2022-01-09 DIAGNOSIS — F32A Depression, unspecified: Secondary | ICD-10-CM | POA: Insufficient documentation

## 2022-01-09 DIAGNOSIS — F1010 Alcohol abuse, uncomplicated: Secondary | ICD-10-CM | POA: Insufficient documentation

## 2022-01-09 DIAGNOSIS — R4587 Impulsiveness: Secondary | ICD-10-CM | POA: Insufficient documentation

## 2022-01-09 DIAGNOSIS — F1910 Other psychoactive substance abuse, uncomplicated: Secondary | ICD-10-CM | POA: Insufficient documentation

## 2022-01-09 LAB — RESP PANEL BY RT-PCR (FLU A&B, COVID) ARPGX2
Influenza A by PCR: NEGATIVE
Influenza B by PCR: NEGATIVE
SARS Coronavirus 2 by RT PCR: NEGATIVE

## 2022-01-09 LAB — I-STAT BETA HCG BLOOD, ED (MC, WL, AP ONLY): I-stat hCG, quantitative: <5 m[IU]/mL (ref <5)

## 2022-01-09 NOTE — ED Notes (Signed)
Pt refused v/s. Attempted to assess pt . Pt told RN to stop she wanted to sleep. Pt would not tell RN why she was here. Pt denied SI/HI. Pt states she doesn't have to urinate. Will attempt to collect sample. Pt defiant at this time. ?

## 2022-01-09 NOTE — Discharge Instructions (Signed)
Substance Abuse Resources  [facility] - Admissions are currently completed Monday through Friday at 8am; both appointments and walk-ins are accepted.  Any individual that is a [place] resident may present for a substance abuse screening and assessment for admission.  A person may be referred by numerous sources or self-refer.   Potential clients will be screened for medical necessity and appropriateness for the program.  Clients must meet criteria for high-intensity residential treatment services.  If clinically appropriate, a client will continue with the comprehensive clinical assessment and intake process, as well as enrollment in the [facility].   Address: [address] [place], [place] [address] Admin Hours: Mon-Fri 8AM to 5PM Center Hours: 24/7 Phone: [phone] Fax: [phone]   [facility] (Detox) Facility Based Crisis:  These are 3 locations for services: Please call before arrival    Address: [address] [place], [place] [address] Phone: [phone]   Address: [address], [place], [place] [address] Phone#: [phone]   Address: [address], [place], [place] [address] Phone#: [phone]     [facility]): (offers outpatient therapy and intensive outpatient substance abuse therapy).  [address], [place], [place] [address] Phone: [phone]   [facility]: Offers FREE recovery skills classes, support groups, 1:1 Peer Support, and Compeer Classes. [address], [place], [place] [address] Phone: [phone] (Call to complete intake).  [facility] [address] [place], [place] [address] Phone: [phone] ext: [phone] [facility] provides food, shelter and other programs and services to the homeless men of [place] through our men's program.   By offering safe shelter, three meals a day,  clean clothing, Biblical counseling, financial planning, vocational training, GED/education and employment assistance, we've helped mend the shattered lives of many homeless men since opening in 1974.   We have approximately 267 beds available, with a max of 312 beds including mats for emergency situations and currently house an average of 270 men a night.   Prospective Client Check-In Information Photo ID Required (State/ Out of State/ DOC) - if photo ID is not available, clients are required to have a printout of a police/sheriff's criminal history report. Help out with chores around the Mission. No sex offender of any type (pending, charged, registered and/or any other sex related offenses) will be permitted to check in. Must be willing to abide by all rules, regulations, and policies established by the [facility]. The following will be provided - shelter, food, clothing, and biblical counseling. If you or someone you know is in need of assistance at our men's shelter in , [place], please call [phone] ext. [phone].   [facility]-will provide timely access to mental health services for children and adolescents (4-17) and adults presenting in a mental health crisis. The program is designed for those who need urgent Behavioral Health or Substance Use treatment and are not experiencing a medical crisis that would typically require an emergency room visit.    [address] [place], [place] [address] Phone: [phone] [email or web address]   [facility]: Phone#: [phone]   The [facility] SA Intensive Outpatient Program (SAIOP) means structured individual and group addiction activities and services that are provided at an outpatient program designed to assist adult and adolescent consumers to begin recovery and learn skills for recovery maintenance. The [facility] SAIOP program is offered at   least 3 hours a  day, 3 days a week.SAIOP services shall include a structured program consisting of, but not limited to, the following services: Individual counseling and support; Group counseling and support; Family counseling, training or support; Biochemical assays to identify recent drug use (e.g., urine drug screens); Strategies for relapse prevention to include community and social support systems in treatment; Life skills; Crisis contingency planning; Disease Management; and Treatment support activities that have been adapted or specifically designed for persons with physical disabilities, or persons with co-occurring disorders of mental illness and substance abuse/dependence or mental retardation/developmental disability and substance abuse/dependence. Phone: [phone]     [facility] 24-Hour Call Center: 1-800-256-2452  Behavioral Health Crisis Line: 1-833-600-2054       

## 2022-01-09 NOTE — ED Notes (Signed)
Friend that is listen as emergency contact called for pt update, told them nurse will call back with a update [phone] ?

## 2022-01-09 NOTE — ED Notes (Signed)
Pt belongings returned, pt confirmed all belongings were returned. And confirmed on patient belongings list ? ?

## 2022-01-09 NOTE — ED Provider Notes (Addendum)
Emergency Medicine Observation Re-evaluation Note ? ?Barbara Gordon is a 31 y.o. female, seen on rounds today.  Pt initially presented to the ED for complaints of being sent from behavioral health for evaluation.  Pt is alert, no distress. Denies pain or other acute complaint. Denies thoughts of harm to self or others. Pt noted to have history polysubstance abuse.  ? ?Physical Exam  ?BP 128/72   Pulse 96   Temp 98.7 ?F (37.1 ?C) (Oral)   Resp 14   SpO2 100%  ?Physical Exam ?General: alert, content, no distress.no signs of head other other significant trauma noted.   ?Cardiac: regular rate.  ?Lungs: breathing comfortably. ?Neuro: alert, oriented. Motor/sens grossly intact bil. Steady gait. No tremor or shakes noted.  ?Psych: alert, content. Normal mood/affect. Pt denies thoughts of harm to self or others. Patient  does not appear to be responding to internal stimuli. No acute delusions or hallucinations are noted.  ? ? ? ?ED Course / MDM  ? ?I have reviewed the labs performed to date as well as medications administered while in observation.  Recent changes in the last 24 hours include ED obs, ?metabolism of substances, reassessment.  ? ?Plan  ? ? Barbara Gordon is not under involuntary commitment. ? ?Patient currently alert appearing, no new c/o, reports feeling improved and ready for d/c. On exam, no acute psychosis noted. Pt does not appear acutely depressed, and denies thoughts of harm to self or others.  ? ?Pt with hx polysubstance use disorder - will provide resource guide, and patient encouraged to follow up as outpatient. Also rec close bh and pcp f/u. ? ?Po fluids/food provided.  ? ?Pt currently appears stable for d/c. Return precautions provided.  ? ? ? ? ?  ?Barbara Laine, MD ?01/09/22 1305 ? ?

## 2022-01-09 NOTE — Discharge Instructions (Addendum)
It was our pleasure to provide your ER care today - we hope that you feel better. ? ?Drink plenty of fluids/stay well hydrated.  ? ?Avoid drug use as drug use is harmful to both your physical health and mental health and well-being.  See resource guide provided for treatment programs, as well as other social and community services.  ? ?Follow up with behavioral health specialist in the coming week.  For mental health issues and/or crisis, you may go directly to the [facility] - see attached info - it is open 24/7 and walk-ins are welcome.  ? ?Return to ER if worse, new symptoms, fevers, new/severe pain, chest pain, trouble breathing, or other concern. ?

## 2022-01-09 NOTE — BH Assessment (Signed)
Patient is a 31 year old walk in that presents voluntary to [facility] today requesting detox after she was seen and assessed earlier this date at [facility] and then requested to be discharged. Patient is well known to area providers and presented last night as a walk in to [facility] where she was observed to have a seizure and was transferred to [facility] for medical clearance. Patient was assessed by TTS this date and was recommended for over night observation. Patient at 1300 hours this date then requested to be discharged. Patient presents back to [facility] at 1400 hours requesting assistance with ongoing SA issues and housing stating she is homeless. Patient denies any S/I, H/I or AVH. As this Clinical research associate and [place] NP attempted to clarify why patient left [facility] less than an hour ago after requesting to be discharged patient stated she "was ready to go." Patient did not provide any specific reason and was observed to be agitated. Patient was informed that [facility] in [place] had beds available this date as patient stated she "might be interested." Patient was also discharged with additional resources although as this writer attempted to provide patient with those resources declined them stating, "I don't need them." Patient was discharged without incident. Individual that brought patient in that states she is a friend agreed to take patient to [facility] in [place] although patient declined.      ?

## 2022-01-09 NOTE — H&P (Signed)
Behavioral Health Medical Screening Exam ? ?Barbara Gordon is a 31 y.o. female  patient presented to [facility] as a walk in accompanied by a friend Barbara Gordon with complaints of wanting help with substance abuse ? ?Barbara Gordon, 31 y.o., female patient seen face to face by this provider, consulted with Dr. Nelly Rout; and chart reviewed on 01/09/22.  On evaluation Barbara Gordon reports she feels fine and doesn't really need any help but would like resources so that she could call herself.  Barbara Gordon a female whom patient has lived with reports a couple months ago her son found patient wondering the street and brought patient to their home.  States that patient went mission with some other people [place] and several days ago called her son to come pick her up.  States that patient doesn't have anywhere to go and needs to get help for drugs and alcohol.   ?During evaluation Barbara Gordon is standing  in no acute distress.  She is alert, oriented x 4, calm, cooperative and attentive.  Her mood is dysphoric with congruent affect.  She has normal speech, and behavior.  Objectively there is no evidence of psychosis/mania or delusional thinking.  Patient is able to converse coherently, goal directed thoughts, no distractibility, or pre-occupation.  She also denies suicidal/self-harm/homicidal ideation, psychosis, and paranoia.  Patient answered question appropriately.   Patient states that she just wants list of resources and she can call herself ? ?Total Time spent with patient: 20 minutes ? ?Psychiatric Specialty Exam: ? ?Presentation  ?General Appearance: Appropriate for Environment; Casual ? ?Eye Contact:Good ? ?Speech:Clear and Coherent; Slow ? ?Speech Volume:Decreased ? ?Handedness:Right ? ? ?Mood and Affect  ?Mood:Dysphoric ? ?Affect:Congruent ? ? ?Thought Process  ?Thought Processes:Coherent ? ?Descriptions of Associations:Intact ? ?Orientation:Full (Time, Place and Person) ? ?Thought Content:Logical ? ?History of  Schizophrenia/Schizoaffective disorder:No ? ?Duration of Psychotic Symptoms:Less than six months ? ?Hallucinations:Hallucinations: None ? ?Ideas of Reference:None ? ?Suicidal Thoughts:Suicidal Thoughts: No ? ?Homicidal Thoughts:Homicidal Thoughts: No ? ? ?Sensorium  ?Memory:Immediate Fair; Recent Fair ? ?Judgment:Fair ? ?Insight:Fair; Present ? ? ?Executive Functions  ?Concentration:Fair ? ?Attention Span:Fair ? ?Recall:Fair ? ?Fund of Knowledge:Fair ? ?Language:Fair ? ? ?Psychomotor Activity  ?Psychomotor Activity:Psychomotor Activity: Normal ? ? ?Assets  ?Assets:Communication Skills; Social Support ? ? ?Sleep  ?Sleep:Sleep: Good ? ? ? ?Physical Exam: ?Physical Exam ?Vitals and nursing note reviewed. Exam conducted with a chaperone present.  ?Constitutional:   ?   General: She is not in acute distress. ?   Appearance: Normal appearance. She is not ill-appearing.  ?Cardiovascular:  ?   Rate and Rhythm: Normal rate.  ?Pulmonary:  ?   Effort: Pulmonary effort is normal.  ?Neurological:  ?   Mental Status: She is alert and oriented to person, place, and time.  ?Psychiatric:     ?   Attention and Perception: Perception normal. She does not perceive auditory or visual hallucinations.     ?   Mood and Affect: Mood is depressed.     ?   Speech: Speech normal.     ?   Behavior: Behavior is slowed. Behavior is cooperative.     ?   Thought Content: Thought content is not paranoid or delusional. Thought content does not include homicidal or suicidal ideation.     ?   Cognition and Memory: Cognition normal.     ?   Judgment: Judgment is impulsive.  ? ?Review of Systems  ?Constitutional: Negative.   ?HENT: Negative.    ?Eyes: Negative.   ?  Respiratory: Negative.    ?Cardiovascular: Negative.   ?Gastrointestinal: Negative.   ?Genitourinary: Negative.   ?Musculoskeletal: Negative.   ?Skin: Negative.   ?Neurological: Negative.   ?Endo/Heme/Allergies: Negative.   ?Psychiatric/Behavioral:  Positive for depression and substance abuse.  Negative for hallucinations and suicidal ideas.   ?Blood pressure (!) 141/90, pulse (!) 117, temperature 97.6 ?F (36.4 ?C), temperature source Oral, resp. rate 20, SpO2 98 %. There is no height or weight on file to calculate BMI. ? ?Musculoskeletal: ?Strength & Muscle Tone: within normal limits ?Gait & Station: normal ?Patient leans: N/A ? ? ?Recommendations:  Referral and resources for substance use rehab and outpatient psychiatric services ? ?Based on my evaluation the patient does not appear to have an emergency medical condition. ? ?Resources given for outpatient psychiatric services and rehab services ? ?Kalyb Pemble, NP ?01/09/2022, 3:30 PM ? ?

## 2022-01-09 NOTE — ED Notes (Signed)
Pt ambulatory in hallway 

## 2022-01-09 NOTE — ED Notes (Signed)
TTS set up and ready for assessment. Pt moved into room 14. Pt stated she does not feel she needs to talk to any "counselor" This RN explained it is part of the process and she needs to complete the assessment. Pt refused to answer pharmacy tech questions. Stating, " I dont feel like talking to anyone" ?

## 2022-01-09 NOTE — ED Notes (Signed)
Pt temporarily moved to room 14 for TTS ?

## 2022-01-09 NOTE — BH Assessment (Signed)
TTS spoke to [facility], to put Pt in a private room to complete TTS assessment.  Clinician to call the cart. ?

## 2022-01-09 NOTE — ED Notes (Signed)
TTS in process 
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
## 2022-01-09 NOTE — ED Notes (Signed)
RN spoke with pt's caretaker for update ?

## 2022-01-09 NOTE — BH Assessment (Signed)
Attempted TTS tele-assessment. Pt appears somnolent and does not respond to any questions. ? ? ?Orpah Greek Anson Fret, [facility], [place] ?Triage Specialist ?[phone] ? ?

## 2022-01-15 ENCOUNTER — Ambulatory Visit (HOSPITAL_COMMUNITY)
Admission: EM | Admit: 2022-01-15 | Discharge: 2022-01-18 | Disposition: A | Discharge status: Other Acute Inpatient Hospital | Payer: No Payment, Other | Attending: Family | Admitting: Family

## 2022-01-15 DIAGNOSIS — F209 Schizophrenia, unspecified: Secondary | ICD-10-CM | POA: Diagnosis not present

## 2022-01-15 DIAGNOSIS — Z20822 Contact with and (suspected) exposure to covid-19: Secondary | ICD-10-CM | POA: Insufficient documentation

## 2022-01-15 DIAGNOSIS — F23 Brief psychotic disorder: Secondary | ICD-10-CM

## 2022-01-15 DIAGNOSIS — Z87820 Personal history of traumatic brain injury: Secondary | ICD-10-CM | POA: Diagnosis not present

## 2022-01-15 DIAGNOSIS — F1911 Other psychoactive substance abuse, in remission: Secondary | ICD-10-CM | POA: Insufficient documentation

## 2022-01-15 LAB — CBC WITH DIFFERENTIAL/PLATELET
Abs Immature Granulocytes: 0.05 10*3/uL (ref 0.00–0.07)
Basophils Absolute: 0 10*3/uL (ref 0.0–0.1)
Basophils Relative: 1 %
Eosinophils Absolute: 0.1 10*3/uL (ref 0.0–0.5)
Eosinophils Relative: 1 %
HCT: 45 % (ref 36.0–46.0)
Hemoglobin: 15.9 g/dL — ABNORMAL HIGH (ref 12.0–15.0)
Immature Granulocytes: 1 %
Lymphocytes Relative: 32 %
Lymphs Abs: 2.7 10*3/uL (ref 0.7–4.0)
MCH: 33.2 pg (ref 26.0–34.0)
MCHC: 35.3 g/dL (ref 30.0–36.0)
MCV: 93.9 fL (ref 80.0–100.0)
Monocytes Absolute: 0.4 10*3/uL (ref 0.1–1.0)
Monocytes Relative: 5 %
Neutro Abs: 5.1 10*3/uL (ref 1.7–7.7)
Neutrophils Relative %: 60 %
Platelets: 340 10*3/uL (ref 150–400)
RBC: 4.79 MIL/uL (ref 3.87–5.11)
RDW: 12.7 % (ref 11.5–15.5)
WBC: 8.4 10*3/uL (ref 4.0–10.5)
nRBC: 0 % (ref 0.0–0.2)

## 2022-01-15 LAB — POCT URINE DRUG SCREEN - MANUAL ENTRY (I-SCREEN)
POC Amphetamine UR: NOT DETECTED
POC Buprenorphine (BUP): NOT DETECTED
POC Cocaine UR: NOT DETECTED
POC Marijuana UR: POSITIVE — AB
POC Methadone UR: NOT DETECTED
POC Methamphetamine UR: NOT DETECTED
POC Morphine: NOT DETECTED
POC Oxazepam (BZO): POSITIVE — AB
POC Oxycodone UR: NOT DETECTED
POC Secobarbital (BAR): NOT DETECTED

## 2022-01-15 LAB — URINALYSIS, ROUTINE W REFLEX MICROSCOPIC
Bilirubin Urine: NEGATIVE
Glucose, UA: NEGATIVE mg/dL
Hgb urine dipstick: NEGATIVE
Ketones, ur: 80 mg/dL — AB
Leukocytes,Ua: NEGATIVE
Nitrite: NEGATIVE
Protein, ur: 30 mg/dL — AB
Specific Gravity, Urine: 1.027 (ref 1.005–1.030)
pH: 5 (ref 5.0–8.0)

## 2022-01-15 LAB — RESP PANEL BY RT-PCR (FLU A&B, COVID) ARPGX2
Influenza A by PCR: NEGATIVE
Influenza B by PCR: NEGATIVE
SARS Coronavirus 2 by RT PCR: NEGATIVE

## 2022-01-15 LAB — COMPREHENSIVE METABOLIC PANEL
ALT: 36 U/L (ref 0–44)
AST: 32 U/L (ref 15–41)
Albumin: 4.8 g/dL (ref 3.5–5.0)
Alkaline Phosphatase: 43 U/L (ref 38–126)
Anion gap: 14 (ref 5–15)
BUN: 12 mg/dL (ref 6–20)
CO2: 21 mmol/L — ABNORMAL LOW (ref 22–32)
Calcium: 9.9 mg/dL (ref 8.9–10.3)
Chloride: 105 mmol/L (ref 98–111)
Creatinine, Ser: 0.76 mg/dL (ref 0.44–1.00)
GFR, Estimated: >60 mL/min (ref >60)
Glucose, Bld: 55 mg/dL — ABNORMAL LOW (ref 70–99)
Potassium: 4 mmol/L (ref 3.5–5.1)
Sodium: 140 mmol/L (ref 135–145)
Total Bilirubin: 1.1 mg/dL (ref 0.3–1.2)
Total Protein: 8.4 g/dL — ABNORMAL HIGH (ref 6.5–8.1)

## 2022-01-15 LAB — LIPID PANEL
Cholesterol: 171 mg/dL (ref 0–200)
HDL: 36 mg/dL — ABNORMAL LOW (ref >40)
LDL Cholesterol: 133 mg/dL — ABNORMAL HIGH (ref 0–99)
Total CHOL/HDL Ratio: 4.8 RATIO
Triglycerides: 10 mg/dL (ref <150)
VLDL: 2 mg/dL (ref 0–40)

## 2022-01-15 LAB — POCT PREGNANCY, URINE: Preg Test, Ur: NEGATIVE

## 2022-01-15 LAB — PREGNANCY, URINE: Preg Test, Ur: NEGATIVE

## 2022-01-15 LAB — MAGNESIUM: Magnesium: 1.9 mg/dL (ref 1.7–2.4)

## 2022-01-15 LAB — TSH: TSH: 0.204 u[IU]/mL — ABNORMAL LOW (ref 0.350–4.500)

## 2022-01-15 LAB — POC SARS CORONAVIRUS 2 AG: SARSCOV2ONAVIRUS 2 AG: NEGATIVE

## 2022-01-15 LAB — ETHANOL: Alcohol, Ethyl (B): <10 mg/dL (ref <10)

## 2022-01-15 MED ORDER — ALUM & MAG HYDROXIDE-SIMETH 200-200-20 MG/5ML PO SUSP: 30.0000 mL | ORAL | Status: DC | PRN

## 2022-01-15 MED ORDER — HYDROXYZINE HCL 25 MG PO TABS: 25.0000 mg | ORAL_TABLET | Freq: Three times a day (TID) | ORAL | Status: DC | PRN

## 2022-01-15 MED ORDER — OLANZAPINE 5 MG PO TBDP
5.0000 mg | ORAL_TABLET | Freq: Two times a day (BID) | ORAL | Status: DC
  Administered 2022-01-15 – 2022-01-17 (×4): 5 mg via ORAL
  Filled 2022-01-15 (×4): qty 1

## 2022-01-15 MED ORDER — MAGNESIUM HYDROXIDE 400 MG/5ML PO SUSP: 30.0000 mL | Freq: Every day | ORAL | Status: DC | PRN

## 2022-01-15 MED ORDER — ACETAMINOPHEN 325 MG PO TABS: 650.0000 mg | ORAL_TABLET | Freq: Four times a day (QID) | ORAL | Status: DC | PRN

## 2022-01-15 MED ORDER — TRAZODONE HCL 50 MG PO TABS
50.0000 mg | ORAL_TABLET | Freq: Every evening | ORAL | Status: DC | PRN
  Administered 2022-01-15 – 2022-01-16 (×2): 50 mg via ORAL
  Filled 2022-01-15 (×2): qty 1

## 2022-01-15 NOTE — ED Notes (Signed)
Staff asked pt if she wanted something to eat upon arrival on the unit. Pt refused food she said she was fine staff advised pt if she wants something to eat to please inform staff. ?

## 2022-01-15 NOTE — ED Notes (Addendum)
Pt admitted to obs due to bizarre behavior. IVC in place. Pt A&O to person and place, calm and mostly cooperative. Pt appears paranoid, guarded, and becomes easily aggravated, asking "why" frequently, cussing under her breath, and stating "this is stupid" when staff ask her to complete tasks. Pt denied SI/HI/AVH, although does speak to and answer herself. Lab work and skin assessment completed. Pt ambulated independently to unit. Oriented to unit/staff. Pt declined offer for food/drink at this time. Pt currently standing at foot of bed staring towards nurse's station. No signs of acute distress noted. Will continue to monitor for safety.  ?

## 2022-01-15 NOTE — ED Notes (Signed)
Pt had been observed by staff just standing by her bed looking around. Pt is clam and safe at the moment. ?

## 2022-01-15 NOTE — Progress Notes (Addendum)
?   01/15/22 1935  ?Patient Reported Information  ?How Did You Hear About [name]? Legal System  ?What Is the Reason for Your Visit/Call Today? Pt was a poor historian and provided minimal information to why she's at [facility]. Pt denies, SI, HI, AVH, self-injurious behaviors and access to weapons.  ?How Long Has This Been Causing You Problems? 1 wk - 1 month  ?What Do You Feel Would Help You the Most Today? Medication(s)  ?Have You Recently Had Any Thoughts About Hurting Yourself? No ?(Pt denies.)  ?Are You Planning to Commit Suicide/Harm Yourself At This time? No  ?Have you Recently Had Thoughts About Hurting Someone [name]? No ?(Pt denies.)  ?Are You Planning To Harm Someone At This Time? No  ?Have You Used Any Alcohol or Drugs in the Past 24 Hours? No ?(Pt denies, substance use.)  ?Do You Currently Have a Therapist/Psychiatrist? No  ?CCA Screening Triage Referral Assessment  ?Type of Contact Face-to-Face  ?Is this Initial or Reassessment? Initial Assessment  ?Location of Assessment [facility]  ?Provider location [facility] Assessment Services  ?Collateral Involvement Pt denies, having supports.  ?Patient Determined To Be At Risk for Harm To Self or Others Based on Review of Patient Reported Information or Presenting Complaint?  ?(Pt denies.)  ?Does Patient Present under Involuntary Commitment? Yes  ?IVC Papers Initial File Date 01/15/22  ?[place] of Residence [place]  ?Patient Currently Receiving the Following Services: Not Receiving Services  ?Determination of Need Urgent (48 hours)  ?Options For Referral Medication Management;BH Urgent Care;Outpatient Therapy;Inpatient Hospitalization  ? ? ?Determination of need: Urgent.  ? ? ? ?Redmond Pulling, MS, [facility] ?Triage Specialist ?[phone] ? ?

## 2022-01-15 NOTE — ED Notes (Signed)
Pt asleep in bed. Respirations even and unlabored. Will continue to monitor for safety. ?

## 2022-01-15 NOTE — ED Provider Notes (Signed)
Behavioral Health Admission H&P [facility] & OBS)  Date: 01/15/22 Patient Name: Barbara Gordon MRN: [id number] Chief Complaint: No chief complaint on file.     Diagnoses:  Final diagnoses:  Brief psychotic disorder [facility])    HPI: Patient presents to [facility] behavioral health under involuntary commitment petition, transported by Patent examiner.  Involuntary commitment petition initiated by Barbara Gordon, friend.  Petition reads: "According to friend, respondent has taken some sort of narcotic substance that has affected her ability to take care of herself.  Friend states that what ever she has done/taken has "erased her mind", she is talking to herself, she is paranoid, she has not been eating or drinking regularly for days.  She has been seen at [facility] and had a seizure while there but was released.  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  Friend is very concerned for her wellbeing as she continues in the same catatonic state."  Patient is assessed face-to-face by nurse practitioner.  She is seated in assessment area, no acute distress.  She is alert and oriented, minimally cooperative during assessment.  She presents with anxious mood, labile affect.  She denies suicidal and homicidal ideations.  She denies any history of suicide attempts, denies any history of nonsuicidal self-harm behavior. She presents with paranoia.  When asked birthdate, patient states "why, why do you ask."  She is reluctant to answer questions during assessment. Several times asks "why?" She presents with tangential conversation, delayed responses.  Potential thought blocking.  She states "brain surgery is crazy, what is the surgery?" Barbara Gordon denies auditory visual hallucinations.  She appears to be responding to internal stimuli, speaking under her breath during assessment.    Patient endorses average sleep and appetite.  She reports  she lives in [place] with friends.  She reports she is currently unemployed.  She denies alcohol and substance use.  Patient is a limited historian at this time.  She reports she currently has no home medications.  No outpatient psychiatry follow-up at this time.  Per medical record review patient has been diagnosed with substance use disorder, substance-induced psychosis, bizarre behavior, suicidal ideation and bipolar disorder and polysubstance abuse.  Patient offered support and encouragement.  She agrees with plan for overnight observation and plan to initiate olanzapine. Patient gives verbal consent to speak with friend, Barbara Gordon, phone number [phone].  Spoke with petitioner, Barbara Gordon who shares that patient seems that "her mind has been erased or her borderline personality disorder is at a tipping point." She has not cared for her personal hygiene for several days. Per Barbara Gordon patient has not ingested any alcohol or illicit substance for one week.    PHQ 2-9:   Flowsheet Row ED from 01/08/2022 in [facility] EMERGENCY DEPARTMENT ED from 10/17/2021 in [place]  [facility]EM[facility]ENCY DEPT ED from 10/15/2021 in [facility] EMERGENCY DEPARTMENT  C-SSRS RISK CATEGORY Moderate Risk High Risk Moderate Risk        Total Time spent with patient: 30 minutes  Musculoskeletal  Strength & Muscle Tone: within normal limits Gait & Station: normal Patient leans: N/A  Psychiatric Specialty Exam  Presentation General Appearance: Appropriate for Environment; Casual  Eye Contact:Fair  Speech:Slow  Speech Volume:Normal  Handedness:Right   Mood and Affect  Mood:Anxious; Labile  Affect:Labile   Thought Process  Thought Processes:Coherent  Descriptions of Associations:Tangential  Orientation:Full (Time, Place and Person)  Thought Content:Paranoid Ideation; Tangential  Diagnosis of Schizophrenia or Schizoaffective disorder in  past: No  Duration of Psychotic Symptoms: Less than six months  Hallucinations:Hallucinations: None  Ideas of Reference:Paranoia  Suicidal Thoughts:Suicidal Thoughts: No  Homicidal Thoughts:Homicidal Thoughts: No   Sensorium  Memory:Immediate Poor  Judgment:Impaired  Insight:Lacking   Executive Functions  Concentration:Fair  Attention Span:Fair  Recall:Fair  Fund of Knowledge:Good  Language:Good   Psychomotor Activity  Psychomotor Activity:Psychomotor Activity: Normal   Assets  Assets:Communication Skills; Housing; Social Support   Sleep  Sleep:Sleep: Fair   Nutritional Assessment (For OBS and FBC admissions only) Has the patient had a weight loss or gain of 10 pounds or more in the last 3 months?: No Has the patient had a decrease in food intake/or appetite?: No Does the patient have dental problems?: No Does the patient have eating habits or behaviors that may be indicators of an eating disorder including binging or inducing vomiting?: No Has the patient recently lost weight without trying?: 0 Has the patient been eating poorly because of a decreased appetite?: 0 Malnutrition Screening Tool Score: 0    Physical Exam Vitals and nursing note reviewed.  Constitutional:      Appearance: Normal appearance. She is well-developed.  HENT:     Head: Normocephalic and atraumatic.     Nose: Nose normal.  Cardiovascular:     Rate and Rhythm: Normal rate.  Pulmonary:     Effort: Pulmonary effort is normal.  Musculoskeletal:        General: Normal range of motion.     Cervical back: Normal range of motion.  Skin:    General: Skin is warm and dry.  Neurological:     Mental Status: She is alert and oriented to person, place, and time.  Psychiatric:        Attention and Perception: Attention normal.        Mood and Affect: Mood is anxious. Affect is labile.        Speech: Speech is tangential.        Behavior: Behavior is slowed. Behavior is cooperative.         Thought Content: Thought content is paranoid.        Cognition and Memory: Cognition normal.        Judgment: Judgment is inappropriate.   Review of Systems  Constitutional: Negative.   HENT: Negative.    Eyes: Negative.   Respiratory: Negative.    Cardiovascular: Negative.   Gastrointestinal: Negative.   Genitourinary: Negative.   Musculoskeletal: Negative.   Skin: Negative.   Neurological: Negative.   Endo/Heme/Allergies: Negative.   Psychiatric/Behavioral:  The patient is nervous/anxious.    Blood pressure 105/88, pulse 81, temperature 98.2 F (36.8 C), temperature source Oral, resp. rate 17, SpO2 100 %. There is no height or weight on file to calculate BMI.  Past Psychiatric History: Substance-induced psychosis, substance-induced mood disorder, bipolar disorder  Is the patient at risk to self? No  Has the patient been a risk to self in the past 6 months? No .    Has the patient been a risk to self within the distant past? No   Is the patient a risk to others? No   Has the patient been a risk to others in the past 6 months? No   Has the patient been a risk to others within the distant past? No   Past Medical History: No past medical history on file. No past surgical history on file.  Family History: No  family history on file.  Social History:  Social History   Socioeconomic History   Marital status: Single    Spouse name: Not on file   Number of children: Not on file   Years of education: Not on file   Highest education level: Not on file  Occupational History   Not on file  Tobacco Use   Smoking status: Some Days    Types: Cigarettes   Smokeless tobacco: Never  Substance and Sexual Activity   Alcohol use: Not on file   Drug use: Never   Sexual activity: Never  Other Topics Concern   Not on file  Social History Narrative   Not on file   Social Determinants of Health   Financial Resource Strain: Not on file  Food Insecurity: Not on file   Transportation Needs: Not on file  Physical Activity: Not on file  Stress: Not on file  Social Connections: Not on file  Intimate Partner Violence: Not on file    SDOH:  SDOH Screenings   Alcohol Screen: Not on file  Depression (PHQ2-9): Not on file  Financial Resource Strain: Not on file  Food Insecurity: Not on file  Housing: Not on file  Physical Activity: Not on file  Social Connections: Not on file  Stress: Not on file  Tobacco Use: High Risk   Smoking Tobacco Use: Some Days   Smokeless Tobacco Use: Never   Passive Exposure: Not on file  Transportation Needs: Not on file    Last Labs:  Admission on 01/08/2022, Discharged on 01/09/2022  Component Date Value Ref Range Status   WBC 01/08/2022 9.2  4.0 - 10.5 K/uL Final   RBC 01/08/2022 4.65  3.87 - 5.11 MIL/uL Final   Hemoglobin 01/08/2022 15.3 (H)  12.0 - 15.0 g/dL Final   HCT 16/08/9603 43.5  36.0 - 46.0 % Final   MCV 01/08/2022 93.5  80.0 - 100.0 fL Final   MCH 01/08/2022 32.9  26.0 - 34.0 pg Final   MCHC 01/08/2022 35.2  30.0 - 36.0 g/dL Final   RDW 54/07/8118 12.5  11.5 - 15.5 % Final   Platelets 01/08/2022 352  150 - 400 K/uL Final   nRBC 01/08/2022 0.0  0.0 - 0.2 % Final   Neutrophils Relative % 01/08/2022 69  % Final   Neutro Abs 01/08/2022 6.4  1.7 - 7.7 K/uL Final   Lymphocytes Relative 01/08/2022 24  % Final   Lymphs Abs 01/08/2022 2.2  0.7 - 4.0 K/uL Final   Monocytes Relative 01/08/2022 5  % Final   Monocytes Absolute 01/08/2022 0.5  0.1 - 1.0 K/uL Final   Eosinophils Relative 01/08/2022 1  % Final   Eosinophils Absolute 01/08/2022 0.1  0.0 - 0.5 K/uL Final   Basophils Relative 01/08/2022 1  % Final   Basophils Absolute 01/08/2022 0.1  0.0 - 0.1 K/uL Final   Immature Granulocytes 01/08/2022 0  % Final   Abs Immature Granulocytes 01/08/2022 0.04  0.00 - 0.07 K/uL Final   Performed at [facility] Lab, [address], [place], [place] [address]   Sodium 01/08/2022 135  135 - 145 mmol/L Final    Potassium 01/08/2022 4.2  3.5 - 5.1 mmol/L Final   Chloride 01/08/2022 102  98 - 111 mmol/L Final   CO2 01/08/2022 21 (L)  22 - 32 mmol/L Final   Glucose, Bld 01/08/2022 145 (H)  70 - 99 mg/dL Final   Glucose reference range applies only to samples taken after fasting for  at least 8 hours.   BUN 01/08/2022 8  6 - 20 mg/dL Final   Creatinine, Ser 01/08/2022 0.80  0.44 - 1.00 mg/dL Final   Calcium 16/10/960403/01/2022 9.6  8.9 - 10.3 mg/dL Final   Total Protein 54/09/811903/01/2022 7.9  6.5 - 8.1 g/dL Final   Albumin 14/78/295603/01/2022 4.7  3.5 - 5.0 g/dL Final   AST 21/30/865703/01/2022 40  15 - 41 U/L Final   ALT 01/08/2022 48 (H)  0 - 44 U/L Final   Alkaline Phosphatase 01/08/2022 51  38 - 126 U/L Final   Total Bilirubin 01/08/2022 0.7  0.3 - 1.2 mg/dL Final   GFR, Estimated 01/08/2022 >60  >60 mL/min Final   Comment: (NOTE) Calculated using the CKD-EPI Creatinine Equation (2021)    Anion gap 01/08/2022 12  5 - 15 Final   Performed at [facility], [address] [place], [place] [address]   Alcohol, Ethyl (B) 01/08/2022 <10  <10 mg/dL Final   Comment: (NOTE) Lowest detectable limit for serum alcohol is 10 mg/dL.  For medical purposes only. Performed at [facility], [address] [place], [place] [address]    Acetaminophen (Tylenol), Serum 01/08/2022 <10 (L)  10 - 30 ug/mL Final   Comment: (NOTE) Therapeutic concentrations vary significantly. A range of 10-30 ug/mL  may be an effective concentration for many patients. However, some  are best treated at concentrations outside of this range. Acetaminophen concentrations >150 ug/mL at 4 hours after ingestion  and >50 ug/mL at 12 hours after ingestion are often associated with  toxic reactions.  Performed at [facility], [address] [place], [place] [address]    Salicylate Lvl 01/08/2022 <7.0 (L)  7.0 - 30.0 mg/dL Final   Performed at [facility], [address] [place], [place] [address]   I-stat hCG, quantitative 01/09/2022 <5.0   <5 mIU/mL Final   Comment 3 01/09/2022          Final   Comment:   GEST. AGE      CONC.  (mIU/mL)   <=1 WEEK        5 - 50     2 WEEKS       50 - 500     3 WEEKS       100 - 10,000     4 WEEKS     1,000 - 30,000        FEMALE AND NON-PREGNANT FEMALE:     LESS THAN 5 mIU/mL    SARS Coronavirus 2 by RT PCR 01/09/2022 NEGATIVE  NEGATIVE Final   Comment: (NOTE) SARS-CoV-2 target nucleic acids are NOT DETECTED.  The SARS-CoV-2 RNA is generally detectable in upper respiratory specimens during the acute phase of infection. The lowest concentration of SARS-CoV-2 viral copies this assay can detect is 138 copies/mL. A negative result does not preclude SARS-Cov-2 infection and should not be used as the sole basis for treatment or other patient management decisions. A negative result may occur with  improper specimen collection/handling, submission of specimen other than nasopharyngeal swab, presence of viral mutation(s) within the areas targeted by this assay, and inadequate number of viral copies(<138 copies/mL). A negative result must be combined with clinical observations, patient history, and epidemiological information. The expected result is Negative.  Fact Sheet for Patients:  BloggerCourse.comhttps://www.fda.gov/media/152166/download  Fact Sheet for Healthcare Providers:  SeriousBroker.ithttps://www.fda.gov/media/152162/download  This test is no  t yet approved or cleared by the [place] and  has been authorized for detection and/or diagnosis of SARS-CoV-2 by FDA under an Emergency Use Authorization (EUA). This EUA will remain  in effect (meaning this test can be used) for the duration of the COVID-19 declaration under Section 564(b)(1) of the Act, 21 U.S.C.section 360bbb-3(b)(1), unless the authorization is terminated  or revoked sooner.       Influenza A by PCR 01/09/2022 NEGATIVE  NEGATIVE Final   Influenza B by PCR 01/09/2022 NEGATIVE  NEGATIVE Final   Comment:  (NOTE) The Xpert Xpress SARS-CoV-2/FLU/RSV plus assay is intended as an aid in the diagnosis of influenza from Nasopharyngeal swab specimens and should not be used as a sole basis for treatment. Nasal washings and aspirates are unacceptable for Xpert Xpress SARS-CoV-2/FLU/RSV testing.  Fact Sheet for Patients: BloggerCourse.com  Fact Sheet for Healthcare Providers: SeriousBroker.it  This test is not yet approved or cleared by the [place] FDA and has been authorized for detection and/or diagnosis of SARS-CoV-2 by FDA under an Emergency Use Authorization (EUA). This EUA will remain in effect (meaning this test can be used) for the duration of the COVID-19 declaration under Section 564(b)(1) of the Act, 21 U.S.C. section 360bbb-3(b)(1), unless the authorization is terminated or revoked.  Performed at [facility], [address] [place], [place] [address]   Admission on 10/15/2021, Discharged on 10/17/2021  Component Date Value Ref Range Status   Sodium 10/16/2021 135  135 - 145 mmol/L Final   Potassium 10/16/2021 3.7  3.5 - 5.1 mmol/L Final   Chloride 10/16/2021 102  98 - 111 mmol/L Final   CO2 10/16/2021 23  22 - 32 mmol/L Final   Glucose, Bld 10/16/2021 78  70 - 99 mg/dL Final   Glucose reference range applies only to samples taken after fasting for at least 8 hours.   BUN 10/16/2021 8  6 - 20 mg/dL Final   Creatinine, Ser 10/16/2021 0.70  0.44 - 1.00 mg/dL Final   Calcium 78/29/5621 9.1  8.9 - 10.3 mg/dL Final   Total Protein 30/86/5784 7.0  6.5 - 8.1 g/dL Final   Albumin 69/62/9528 4.2  3.5 - 5.0 g/dL Final   AST 41/32/4401 43 (H)  15 - 41 U/L Final   ALT 10/16/2021 40  0 - 44 U/L Final   Alkaline Phosphatase 10/16/2021 44  38 - 126 U/L Final   Total Bilirubin 10/16/2021 1.4 (H)  0.3 - 1.2 mg/dL Final   GFR, Estimated 10/16/2021 >60  >60 mL/min Final   Comment: (NOTE) Calculated using the CKD-EPI Creatinine  Equation (2021)    Anion gap 10/16/2021 10  5 - 15 Final   Performed at [facility], [address], [place], [place] [address]   WBC 10/16/2021 7.0  4.0 - 10.5 K/uL Final   RBC 10/16/2021 4.24  3.87 - 5.11 MIL/uL Final   Hemoglobin 10/16/2021 13.9  12.0 - 15.0 g/dL Final   HCT 36/64/4034 39.5  36.0 - 46.0 % Final   MCV 10/16/2021 93.2  80.0 - 100.0 fL Final   MCH 10/16/2021 32.8  26.0 - 34.0 pg Final   MCHC 10/16/2021 35.2  30.0 - 36.0 g/dL Final   RDW 74/25/9563 12.7  11.5 - 15.5 % Final   Platelets 10/16/2021 319  150 - 400 K/uL Final   nRBC 10/16/2021 0.0  0.0 - 0.2 % Final   Neutrophils Relative % 10/16/2021 59  % Final   Neutro  Abs 10/16/2021 4.1  1.7 - 7.7 K/uL Final   Lymphocytes Relative 10/16/2021 29  % Final   Lymphs Abs 10/16/2021 2.0  0.7 - 4.0 K/uL Final   Monocytes Relative 10/16/2021 6  % Final   Monocytes Absolute 10/16/2021 0.4  0.1 - 1.0 K/uL Final   Eosinophils Relative 10/16/2021 5  % Final   Eosinophils Absolute 10/16/2021 0.3  0.0 - 0.5 K/uL Final   Basophils Relative 10/16/2021 1  % Final   Basophils Absolute 10/16/2021 0.1  0.0 - 0.1 K/uL Final   Immature Granulocytes 10/16/2021 0  % Final   Abs Immature Granulocytes 10/16/2021 0.03  0.00 - 0.07 K/uL Final   Performed at [facility], [address], [place], [place] [address]   Alcohol, Ethyl (B) 10/16/2021 <10  <10 mg/dL Final   Comment: (NOTE) Lowest detectable limit for serum alcohol is 10 mg/dL.  For medical purposes only. Performed at [facility], [address], [place], [place] [address]    I-stat hCG, quantitative 10/16/2021 <5.0  <5 mIU/mL Final   Comment 3 10/16/2021          Final   Comment:   GEST. AGE      CONC.  (mIU/mL)   <=1 WEEK        5 - 50     2 WEEKS       50 - 500     3 WEEKS       100 - 10,000     4 WEEKS     1,000 - 30,000        FEMALE AND NON-PREGNANT FEMALE:     LESS THAN 5 mIU/mL    Lactic Acid, Venous 10/16/2021 0.7  0.5 - 1.9 mmol/L Final    Performed at [facility], [address], [place], [place] [address]   SARS Coronavirus 2 by RT PCR 10/16/2021 NEGATIVE  NEGATIVE Final   Comment: (NOTE) SARS-CoV-2 target nucleic acids are NOT DETECTED.  The SARS-CoV-2 RNA is generally detectable in upper respiratory specimens during the acute phase of infection. The lowest concentration of SARS-CoV-2 viral copies this assay can detect is 138 copies/mL. A negative result does not preclude SARS-Cov-2 infection and should not be used as the sole basis for treatment or other patient management decisions. A negative result may occur with  improper specimen collection/handling, submission of specimen other than nasopharyngeal swab, presence of viral mutation(s) within the areas targeted by this assay, and inadequate number of viral copies(<138 copies/mL). A negative result must be combined with clinical observations, patient history, and epidemiological information. The expected result is Negative.  Fact Sheet for Patients:  BloggerCourse.com  Fact Sheet for Healthcare Providers:  SeriousBroker.it  This test is no                          t yet approved or cleared by the [place] FDA and  has been authorized for detection and/or diagnosis of SARS-CoV-2 by FDA under an Emergency Use Authorization (EUA). This EUA will remain  in effect (meaning this test can be used) for the duration of the COVID-19 declaration under Section 564(b)(1) of the Act, 21 U.S.C.section 360bbb-3(b)(1), unless the authorization is terminated  or revoked sooner.       Influenza A by PCR 10/16/2021 NEGATIVE  NEGATIVE Final   Influenza B by PCR 10/16/2021 NEGATIVE  NEGATIVE Final   Comment: (NOTE) The Xpert Xpress SARS-CoV-2/FLU/RSV plus  assay is intended as an aid in the diagnosis of influenza from Nasopharyngeal swab specimens and should not be used as a sole basis for treatment. Nasal washings  and aspirates are unacceptable for Xpert Xpress SARS-CoV-2/FLU/RSV testing.  Fact Sheet for Patients: BloggerCourse.com  Fact Sheet for Healthcare Providers: SeriousBroker.it  This test is not yet approved or cleared by the [place] FDA and has been authorized for detection and/or diagnosis of SARS-CoV-2 by FDA under an Emergency Use Authorization (EUA). This EUA will remain in effect (meaning this test can be used) for the duration of the COVID-19 declaration under Section 564(b)(1) of the Act, 21 U.S.C. section 360bbb-3(b)(1), unless the authorization is terminated or revoked.  Performed at [facility], [address], [place], [place] [address]   Admission on 10/09/2021, Discharged on 10/10/2021  Component Date Value Ref Range Status   SARS Coronavirus 2 by RT PCR 10/09/2021 NEGATIVE  NEGATIVE Final   Comment: (NOTE) SARS-CoV-2 target nucleic acids are NOT DETECTED.  The SARS-CoV-2 RNA is generally detectable in upper respiratory specimens during the acute phase of infection. The lowest concentration of SARS-CoV-2 viral copies this assay can detect is 138 copies/mL. A negative result does not preclude SARS-Cov-2 infection and should not be used as the sole basis for treatment or other patient management decisions. A negative result may occur with  improper specimen collection/handling, submission of specimen other than nasopharyngeal swab, presence of viral mutation(s) within the areas targeted by this assay, and inadequate number of viral copies(<138 copies/mL). A negative result must be combined with clinical observations, patient history, and epidemiological information. The expected result is Negative.  Fact Sheet for Patients:  BloggerCourse.com  Fact Sheet for Healthcare Providers:  SeriousBroker.it  This test is no                          t yet  approved or cleared by the [place] FDA and  has been authorized for detection and/or diagnosis of SARS-CoV-2 by FDA under an Emergency Use Authorization (EUA). This EUA will remain  in effect (meaning this test can be used) for the duration of the COVID-19 declaration under Section 564(b)(1) of the Act, 21 U.S.C.section 360bbb-3(b)(1), unless the authorization is terminated  or revoked sooner.       Influenza A by PCR 10/09/2021 NEGATIVE  NEGATIVE Final   Influenza B by PCR 10/09/2021 NEGATIVE  NEGATIVE Final   Comment: (NOTE) The Xpert Xpress SARS-CoV-2/FLU/RSV plus assay is intended as an aid in the diagnosis of influenza from Nasopharyngeal swab specimens and should not be used as a sole basis for treatment. Nasal washings and aspirates are unacceptable for Xpert Xpress SARS-CoV-2/FLU/RSV testing.  Fact Sheet for Patients: BloggerCourse.com  Fact Sheet for Healthcare Providers: SeriousBroker.it  This test is not yet approved or cleared by the [place] FDA and has been authorized for detection and/or diagnosis of SARS-CoV-2 by FDA under an Emergency Use Authorization (EUA). This EUA will remain in effect (meaning this test can be used) for the duration of the COVID-19 declaration under Section 564(b)(1) of the Act, 21 U.S.C. section 360bbb-3(b)(1), unless the authorization is terminated or revoked.  Performed at [facility], [address] [place], [place] [address]    WBC 10/09/2021 9.0  4.0 - 10.5 K/uL Final   RBC 10/09/2021 4.54  3.87 - 5.11 MIL/uL Final   Hemoglobin 10/09/2021 14.8  12.0 - 15.0 g/dL Final   HCT 09/81/1914 42.4  36.0 - 46.0 % Final   MCV 10/09/2021 93.4  80.0 - 100.0 fL Final   MCH 10/09/2021 32.6  26.0 - 34.0 pg Final   MCHC 10/09/2021 34.9  30.0 - 36.0 g/dL Final   RDW 16/08/9603 12.1  11.5 - 15.5 % Final   Platelets 10/09/2021 328  150 - 400 K/uL Final   nRBC 10/09/2021 0.0  0.0 -  0.2 % Final   Neutrophils Relative % 10/09/2021 71  % Final   Neutro Abs 10/09/2021 6.4  1.7 - 7.7 K/uL Final   Lymphocytes Relative 10/09/2021 23  % Final   Lymphs Abs 10/09/2021 2.1  0.7 - 4.0 K/uL Final   Monocytes Relative 10/09/2021 4  % Final   Monocytes Absolute 10/09/2021 0.4  0.1 - 1.0 K/uL Final   Eosinophils Relative 10/09/2021 1  % Final   Eosinophils Absolute 10/09/2021 0.1  0.0 - 0.5 K/uL Final   Basophils Relative 10/09/2021 1  % Final   Basophils Absolute 10/09/2021 0.1  0.0 - 0.1 K/uL Final   Immature Granulocytes 10/09/2021 0  % Final   Abs Immature Granulocytes 10/09/2021 0.02  0.00 - 0.07 K/uL Final   Performed at [facility] Lab, [address] [address], [place], [place] [address]   Sodium 10/09/2021 136  135 - 145 mmol/L Final   Potassium 10/09/2021 3.7  3.5 - 5.1 mmol/L Final   Chloride 10/09/2021 99  98 - 111 mmol/L Final   CO2 10/09/2021 27  22 - 32 mmol/L Final   Glucose, Bld 10/09/2021 80  70 - 99 mg/dL Final   Glucose reference range applies only to samples taken after fasting for at least 8 hours.   BUN 10/09/2021 7  6 - 20 mg/dL Final   Creatinine, Ser 10/09/2021 0.64  0.44 - 1.00 mg/dL Final   Calcium 11/91/4782 10.1  8.9 - 10.3 mg/dL Final   Total Protein 95/62/1308 8.1  6.5 - 8.1 g/dL Final   Albumin 65/78/4696 4.7  3.5 - 5.0 g/dL Final   AST 29/52/8413 30  15 - 41 U/L Final   ALT 10/09/2021 44  0 - 44 U/L Final   Alkaline Phosphatase 10/09/2021 51  38 - 126 U/L Final   Total Bilirubin 10/09/2021 0.6  0.3 - 1.2 mg/dL Final   GFR, Estimated 10/09/2021 >60  >60 mL/min Final   Comment: (NOTE) Calculated using the CKD-EPI Creatinine Equation (2021)    Anion gap 10/09/2021 10  5 - 15 Final   Performed at [facility], [address] [place], [place] [address]   Hgb A1c MFr Bld 10/09/2021 5.0  4.8 - 5.6 % Final   Comment: (NOTE) Pre diabetes:          5.7%-6.4%  Diabetes:              >6.4%  Glycemic control for   <7.0% adults with diabetes     Mean Plasma Glucose 10/09/2021 96.8  mg/dL Final   Performed at [facility], [address], [place], [place] [address]   Alcohol, Ethyl (B) 10/09/2021 <10  <10 mg/dL Final   Comment: (NOTE) Lowest detectable limit for serum alcohol is 10 mg/dL.  For medical purposes only. Performed at [facility], [address], [place], [place] [address]    Cholesterol 10/09/2021 198  0 - 200 mg/dL Final   Triglycerides 03/47/4259 63  <150 mg/dL Final   HDL 56/38/7564 54  >40 mg/dL Final   Total CHOL/HDL Ratio 10/09/2021  3.7  RATIO Final   VLDL 10/09/2021 13  0 - 40 mg/dL Final   LDL Cholesterol 10/09/2021 131 (H)  0 - 99 mg/dL Final   Comment:        Total Cholesterol/HDL:CHD Risk Coronary Heart Disease Risk Table                     Men   Women  1/2 Average Risk   3.4   3.3  Average Risk       5.0   4.4  2 X Average Risk   9.6   7.1  3 X Average Risk  23.4   11.0        Use the calculated Patient Ratio above and the CHD Risk Table to determine the patient's CHD Risk.        ATP III CLASSIFICATION (LDL):  <100     mg/dL   Optimal  413-244  mg/dL   Near or Above                    Optimal  130-159  mg/dL   Borderline  010-272  mg/dL   High  >536     mg/dL   Very High Performed at [facility], [address], [place], [place] [address]    TSH 10/09/2021 1.972  0.350 - 4.500 uIU/mL Final   Comment: Performed by a 3rd Generation assay with a functional sensitivity of <=0.01 uIU/mL. Performed at [facility], [address], [place], [place] [address]    POC Amphetamine UR 10/09/2021 None Detected  NONE DETECTED (Cut Off Level 1000 ng/mL) Final   POC Secobarbital (BAR) 10/09/2021 None Detected  NONE DETECTED (Cut Off Level 300 ng/mL) Final   POC Buprenorphine (BUP) 10/09/2021 None Detected  NONE DETECTED (Cut Off Level 10 ng/mL) Final   POC Oxazepam (BZO) 10/09/2021 None Detected  NONE DETECTED (Cut Off Level 300 ng/mL) Final   POC Cocaine UR 10/09/2021 None  Detected  NONE DETECTED (Cut Off Level 300 ng/mL) Final   POC Methamphetamine UR 10/09/2021 None Detected  NONE DETECTED (Cut Off Level 1000 ng/mL) Final   POC Morphine 10/09/2021 None Detected  NONE DETECTED (Cut Off Level 300 ng/mL) Final   POC Oxycodone UR 10/09/2021 None Detected  NONE DETECTED (Cut Off Level 100 ng/mL) Final   POC Methadone UR 10/09/2021 None Detected  NONE DETECTED (Cut Off Level 300 ng/mL) Final   POC Marijuana UR 10/09/2021 Positive (A)  NONE DETECTED (Cut Off Level 50 ng/mL) Final   SARS Coronavirus 2 Ag 10/09/2021 Negative  Negative Preliminary   Preg Test, Ur 10/09/2021 NEGATIVE  NEGATIVE Final   Comment:        THE SENSITIVITY OF THIS METHODOLOGY IS >24 mIU/mL    SARSCOV2ONAVIRUS 2 AG 10/09/2021 NEGATIVE  NEGATIVE Final   Comment: (NOTE) SARS-CoV-2 antigen NOT DETECTED.   Negative results are presumptive.  Negative results do not preclude SARS-CoV-2 infection and should not be used as the sole basis for treatment or other patient management decisions, including infection  control decisions, particularly in the presence of clinical signs and  symptoms consistent with COVID-19, or in those who have been in contact with the virus.  Negative results must be combined with clinical observations, patient history, and epidemiological information. The expected result is Negative.  Fact Sheet for Patients: https://www.jennings-kim.com/  Fact Sheet for Healthcare Providers: https://alexander-rogers.biz/  This test is not yet approved or cleared by the [place]  FDA and  has been authorized for detection and/or diagnosis of SARS-CoV-2 by FDA under an Emergency Use Authorization (EUA).  This EUA will remain in effect (meaning this test can be used) for the duration of  the COV                          ID-19 declaration under Section 564(b)(1) of the Act, 21 U.S.C. section 360bbb-3(b)(1), unless the authorization is terminated or revoked  sooner.    Admission on 10/08/2021, Discharged on 10/09/2021  Component Date Value Ref Range Status   Sodium 10/08/2021 136  135 - 145 mmol/L Final   Potassium 10/08/2021 3.9  3.5 - 5.1 mmol/L Final   Chloride 10/08/2021 103  98 - 111 mmol/L Final   CO2 10/08/2021 23  22 - 32 mmol/L Final   Glucose, Bld 10/08/2021 102 (H)  70 - 99 mg/dL Final   Glucose reference range applies only to samples taken after fasting for at least 8 hours.   BUN 10/08/2021 11  6 - 20 mg/dL Final   Creatinine, Ser 10/08/2021 0.68  0.44 - 1.00 mg/dL Final   Calcium 91/47/8295 9.6  8.9 - 10.3 mg/dL Final   Total Protein 62/13/0865 8.2 (H)  6.5 - 8.1 g/dL Final   Albumin 78/46/9629 4.8  3.5 - 5.0 g/dL Final   AST 52/84/1324 33  15 - 41 U/L Final   ALT 10/08/2021 52 (H)  0 - 44 U/L Final   Alkaline Phosphatase 10/08/2021 55  38 - 126 U/L Final   Total Bilirubin 10/08/2021 0.8  0.3 - 1.2 mg/dL Final   GFR, Estimated 10/08/2021 >60  >60 mL/min Final   Comment: (NOTE) Calculated using the CKD-EPI Creatinine Equation (2021)    Anion gap 10/08/2021 10  5 - 15 Final   Performed at [facility], [address] [place], [place] [address]   Acetaminophen (Tylenol), Serum 10/08/2021 <10 (L)  10 - 30 ug/mL Final   Comment: (NOTE) Therapeutic concentrations vary significantly. A range of 10-30 ug/mL  may be an effective concentration for many patients. However, some  are best treated at concentrations outside of this range. Acetaminophen concentrations >150 ug/mL at 4 hours after ingestion  and >50 ug/mL at 12 hours after ingestion are often associated with  toxic reactions.  Performed at [facility], [address], [place], [place] [address]    Alcohol, Ethyl (B) 10/08/2021 <10  <10 mg/dL Final   Comment: (NOTE) Lowest detectable limit for serum alcohol is 10 mg/dL.  For medical purposes only. Performed at [facility], [address], [place], [place] [address]    Salicylate Lvl 10/08/2021 <7.0 (L)  7.0 - 30.0  mg/dL Final   Performed at [facility], [address] [place], [place] [address]   WBC 10/08/2021 8.5  4.0 - 10.5 K/uL Final   RBC 10/08/2021 4.35  3.87 - 5.11 MIL/uL Final   Hemoglobin 10/08/2021 14.3  12.0 - 15.0 g/dL Final   HCT 95/63/8756 41.0  36.0 - 46.0 % Final   MCV 10/08/2021 94.3  80.0 - 100.0 fL Final   MCH 10/08/2021 32.9  26.0 - 34.0 pg Final   MCHC 10/08/2021 34.9  30.0 - 36.0 g/dL Final   RDW 43/32/9518 12.2  11.5 - 15.5 % Final   Platelets 10/08/2021 312  150 - 400 K/uL Final   nRBC 10/08/2021 0.0  0.0 - 0.2 % Final   Neutrophils Relative % 10/08/2021 72  %  Final   Neutro Abs 10/08/2021 6.1  1.7 - 7.7 K/uL Final   Lymphocytes Relative 10/08/2021 22  % Final   Lymphs Abs 10/08/2021 1.8  0.7 - 4.0 K/uL Final   Monocytes Relative 10/08/2021 4  % Final   Monocytes Absolute 10/08/2021 0.4  0.1 - 1.0 K/uL Final   Eosinophils Relative 10/08/2021 1  % Final   Eosinophils Absolute 10/08/2021 0.1  0.0 - 0.5 K/uL Final   Basophils Relative 10/08/2021 1  % Final   Basophils Absolute 10/08/2021 0.0  0.0 - 0.1 K/uL Final   Immature Granulocytes 10/08/2021 0  % Final   Abs Immature Granulocytes 10/08/2021 0.03  0.00 - 0.07 K/uL Final   Performed at [facility], [address], [place], [place] [address]   I-stat hCG, quantitative 10/08/2021 <5.0  <5 mIU/mL Final   Comment 3 10/08/2021          Final   Comment:   GEST. AGE      CONC.  (mIU/mL)   <=1 WEEK        5 - 50     2 WEEKS       50 - 500     3 WEEKS       100 - 10,000     4 WEEKS     1,000 - 30,000        FEMALE AND NON-PREGNANT FEMALE:     LESS THAN 5 mIU/mL    Opiates 10/08/2021 NONE DETECTED  NONE DETECTED Final   Cocaine 10/08/2021 NONE DETECTED  NONE DETECTED Final   Benzodiazepines 10/08/2021 NONE DETECTED  NONE DETECTED Final   Amphetamines 10/08/2021 NONE DETECTED  NONE DETECTED Final   Tetrahydrocannabinol 10/08/2021 POSITIVE (A)  NONE DETECTED Final   Barbiturates 10/08/2021 NONE DETECTED  NONE DETECTED Final    Comment: (NOTE) DRUG SCREEN FOR MEDICAL PURPOSES ONLY.  IF CONFIRMATION IS NEEDED FOR ANY PURPOSE, NOTIFY LAB WITHIN 5 DAYS.  LOWEST DETECTABLE LIMITS FOR URINE DRUG SCREEN Drug Class                     Cutoff (ng/mL) Amphetamine and metabolites    1000 Barbiturate and metabolites    200 Benzodiazepine                 200 Tricyclics and metabolites     300 Opiates and metabolites        300 Cocaine and metabolites        300 THC                            50 Performed at [facility], [address], [place], [place] [address]    Color, Urine 10/08/2021 YELLOW  YELLOW Final   APPearance 10/08/2021 HAZY (A)  CLEAR Final   Specific Gravity, Urine 10/08/2021 1.025  1.005 - 1.030 Final   pH 10/08/2021 6.0  5.0 - 8.0 Final   Glucose, UA 10/08/2021 NEGATIVE  NEGATIVE mg/dL Final   Hgb urine dipstick 10/08/2021 NEGATIVE  NEGATIVE Final   Bilirubin Urine 10/08/2021 NEGATIVE  NEGATIVE Final   Ketones, ur 10/08/2021 NEGATIVE  NEGATIVE mg/dL Final   Protein, ur 09/81/1914 NEGATIVE  NEGATIVE mg/dL Final   Nitrite 78/29/5621 NEGATIVE  NEGATIVE Final   Leukocytes,Ua 10/08/2021 SMALL (A)  NEGATIVE Final   Performed at [facility], [address], [place], [place] [address]   RBC / HPF 10/08/2021 0-5  0 - 5 RBC/hpf  Final   WBC, UA 10/08/2021 6-10  0 - 5 WBC/hpf Final   Bacteria, UA 10/08/2021 MANY (A)  NONE SEEN Final   Squamous Epithelial / LPF 10/08/2021 21-50  0 - 5 Final   Trichomonas, UA 10/08/2021 PRESENT (A)  NONE SEEN Final   Ca Oxalate Crys, UA 10/08/2021 PRESENT   Final   Performed at [facility], [address], [place], [place] [address]   RPR Ser Ql 10/08/2021 NON REACTIVE  NON REACTIVE Final   Performed at [facility], [address] [place], [place] [address]   HIV Screen 4th Generation wRfx 10/08/2021 Non Reactive  Non Reactive Final   Performed at [facility], [address], [place], [place] [address]    Allergies: Latex and Tramadol  PTA Medications:  (Not in a hospital admission)   Medical Decision Making  Patient reviewed with Dr. Bronwen Betters.  She will be placed in observation area at [facility] behavioral health for treatment and stabilization   Laboratory studies ordered including CBC, CMP, ethanol, A1c, hepatic function, lipid panel, magnesium and TSH.  Urine pregnancy, urine drug screen and urinalysis ordered.  EKG order initiated.  Current medications: -Acetaminophen 650 mg every 6 as needed/mild pain -Maalox 30 mL oral every 4 as needed/digestion -Hydroxyzine 25 mg 3 times daily as needed/anxiety -Magnesium hydroxide 30 mL daily as needed/mild constipation -Trazodone 50 mg nightly as needed/sleep -olanzapine 5mg  BID/mood     Recommendations  Based on my evaluation the patient does not appear to have an emergency medical condition.  Lenard Lance, FNP 01/15/22  6:54 PM
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
## 2022-01-16 MED ORDER — LORAZEPAM 1 MG PO TABS: 1.0000 mg | ORAL_TABLET | ORAL | Status: DC | PRN

## 2022-01-16 MED ORDER — ZIPRASIDONE MESYLATE 20 MG IM SOLR: 20.0000 mg | INTRAMUSCULAR | Status: DC | PRN

## 2022-01-16 MED ORDER — OLANZAPINE 5 MG PO TBDP: 5.0000 mg | ORAL_TABLET | Freq: Three times a day (TID) | ORAL | Status: DC | PRN

## 2022-01-16 NOTE — ED Notes (Signed)
Pt resting with no distress noted.  Will continue to monitor for safety.  ?

## 2022-01-16 NOTE — Progress Notes (Signed)
CSW spoke with [facility] with [facility] in reference to this patient. It was reported that this patient will be further review for possible placement. ? ?Crissie Reese, MSW, LCSW-A, LCAS-A ?Phone: [phone] ?Disposition/TOC ? ?

## 2022-01-16 NOTE — ED Notes (Signed)
Pt was given breakfast and pt starting eating a couple of bites. ?

## 2022-01-16 NOTE — ED Notes (Signed)
Pt remains quiet and resting.  Encouraged again to shower and pt started getting irratable stating, " I dont' feel like it".   Staff will continue to monitor for safety.  ?

## 2022-01-16 NOTE — ED Notes (Signed)
Pt given hygiene supplies but responded by stating " I dont need these".  Pt placed them on the floor and layed back down in bed.  ?

## 2022-01-16 NOTE — ED Notes (Signed)
Pt was given spaghetti and broccoli for dinner. ?

## 2022-01-16 NOTE — ED Notes (Signed)
Pt awake and alert but limited understanding of situation and time.   She has flat affect and labile mood.  Pt denies si,hi and avh at this time.  Inetta Fermo NP in to Eval. Staff will continue to monitor for safety.  ?

## 2022-01-16 NOTE — ED Notes (Signed)
Pt sitting up in bed.  Eating lunch.   No distress noted at this time.   Will continue to monitor for safety.  ?

## 2022-01-16 NOTE — ED Notes (Signed)
Pt asleep in bed. Respirations even and unlabored. Will continue to monitor for safety. ?

## 2022-01-16 NOTE — ED Notes (Signed)
Pt was given peas, chicken/dumplings, and juice for lunch. ?

## 2022-01-16 NOTE — Progress Notes (Signed)
CSW followed-up with [facility] in reference to a referral sent to for review. Additional information was requested. Moreover, nursing notes and updated vitals. CSW faxed information to [facility]. ? ?Crissie Reese, MSW, LCSW-A, LCAS-A ?Phone: [phone] ?Disposition/TOC ?  ?

## 2022-01-16 NOTE — ED Notes (Signed)
Pt refused breakfast when offered. 

## 2022-01-16 NOTE — ED Provider Notes (Signed)
Behavioral Health Progress Note  Date and Time: 01/16/2022 9:18 AM Name: Barbara Gordon MRN:  [id number]  Subjective: Patient states "why, why are you asking, why do you keep trying to guess people stuff?" Barbara Gordon is reassessed, face-to-face, by nurse practitioner.  She is alert and oriented.  She is reclined in observation area upon my approach.  Barbara Gordon appears disheveled. She is minimally cooperative during assessment.  Continues to present with anxious and irritable mood, labile affect. She continues to ask "why?"  Throughout assessment.  She also answers "I do not know" multiple times during reassessment. She continues to deny suicidal and homicidal ideations.  She also denies auditory and visual hallucinations.  She denies symptoms of paranoia.  Concern for responding to internal stimuli.  She appears to mumble under her breath when not engaged in evaluation. She endorses average sleep and appetite. Patient offered support and encouragement.  Reviewed treatment plan to include inpatient psychiatric hospitalization, she verbalizes understanding of plan.   Diagnosis:  Final diagnoses:  Brief psychotic disorder (HCC)    Total Time spent with patient: 30 minutes  Past Psychiatric History: Substance-induced psychosis, substance-induced mood disorder, suicidal ideation Past Medical History: No past medical history on file. No past surgical history on file. Family History: No family history on file. Family Psychiatric  History: None reported Social History:  Social History   Substance and Sexual Activity  Alcohol Use None     Social History   Substance and Sexual Activity  Drug Use Never    Social History   Socioeconomic History   Marital status: Single    Spouse name: Not on file   Number of children: Not on file   Years of education: Not on file   Highest education level: Not on file  Occupational History   Not on file  Tobacco Use   Smoking status: Some Days    Types:  Cigarettes   Smokeless tobacco: Never  Substance and Sexual Activity   Alcohol use: Not on file   Drug use: Never   Sexual activity: Never  Other Topics Concern   Not on file  Social History Narrative   Not on file   Social Determinants of Health   Financial Resource Strain: Not on file  Food Insecurity: Not on file  Transportation Needs: Not on file  Physical Activity: Not on file  Stress: Not on file  Social Connections: Not on file   SDOH:  SDOH Screenings   Alcohol Screen: Not on file  Depression (PHQ2-9): Not on file  Financial Resource Strain: Not on file  Food Insecurity: Not on file  Housing: Not on file  Physical Activity: Not on file  Social Connections: Not on file  Stress: Not on file  Tobacco Use: High Risk   Smoking Tobacco Use: Some Days   Smokeless Tobacco Use: Never   Passive Exposure: Not on file  Transportation Needs: Not on file   Additional Social History:    Pain Medications: See MAR Prescriptions: See MAR Over the Counter: See MAR History of alcohol / drug use?:  (Pt denies.)                    Sleep: Fair  Appetite:  Fair  Current Medications:  Current Facility-Administered Medications  Medication Dose Route Frequency Provider Last Rate Last Admin   acetaminophen (TYLENOL) tablet 650 mg  650 mg Oral Q6H PRN Lenard Lance, FNP       alum & mag hydroxide-simeth (MAALOX/MYLANTA) 200-200-20 MG/5ML  suspension 30 mL  30 mL Oral Q4H PRN Lenard Lance, FNP       hydrOXYzine (ATARAX) tablet 25 mg  25 mg Oral TID PRN Lenard Lance, FNP       magnesium hydroxide (MILK OF MAGNESIA) suspension 30 mL  30 mL Oral Daily PRN Lenard Lance, FNP       OLANZapine zydis (ZYPREXA) disintegrating tablet 5 mg  5 mg Oral BID Lenard Lance, FNP   5 mg at 01/15/22 2106   traZODone (DESYREL) tablet 50 mg  50 mg Oral QHS PRN Lenard Lance, FNP   50 mg at 01/15/22 2106   No current outpatient medications on file.    Labs  Lab Results:  Admission on  01/15/2022  Component Date Value Ref Range Status   SARS Coronavirus 2 by RT PCR 01/15/2022 NEGATIVE  NEGATIVE Final   Comment: (NOTE) SARS-CoV-2 target nucleic acids are NOT DETECTED.  The SARS-CoV-2 RNA is generally detectable in upper respiratory specimens during the acute phase of infection. The lowest concentration of SARS-CoV-2 viral copies this assay can detect is 138 copies/mL. A negative result does not preclude SARS-Cov-2 infection and should not be used as the sole basis for treatment or other patient management decisions. A negative result may occur with  improper specimen collection/handling, submission of specimen other than nasopharyngeal swab, presence of viral mutation(s) within the areas targeted by this assay, and inadequate number of viral copies(<138 copies/mL). A negative result must be combined with clinical observations, patient history, and epidemiological information. The expected result is Negative.  Fact Sheet for Patients:  BloggerCourse.com  Fact Sheet for Healthcare Providers:  SeriousBroker.it  This test is no                          t yet approved or cleared by the [place] FDA and  has been authorized for detection and/or diagnosis of SARS-CoV-2 by FDA under an Emergency Use Authorization (EUA). This EUA will remain  in effect (meaning this test can be used) for the duration of the COVID-19 declaration under Section 564(b)(1) of the Act, 21 U.S.C.section 360bbb-3(b)(1), unless the authorization is terminated  or revoked sooner.       Influenza A by PCR 01/15/2022 NEGATIVE  NEGATIVE Final   Influenza B by PCR 01/15/2022 NEGATIVE  NEGATIVE Final   Comment: (NOTE) The Xpert Xpress SARS-CoV-2/FLU/RSV plus assay is intended as an aid in the diagnosis of influenza from Nasopharyngeal swab specimens and should not be used as a sole basis for treatment. Nasal washings and aspirates are  unacceptable for Xpert Xpress SARS-CoV-2/FLU/RSV testing.  Fact Sheet for Patients: BloggerCourse.com  Fact Sheet for Healthcare Providers: SeriousBroker.it  This test is not yet approved or cleared by the [place] FDA and has been authorized for detection and/or diagnosis of SARS-CoV-2 by FDA under an Emergency Use Authorization (EUA). This EUA will remain in effect (meaning this test can be used) for the duration of the COVID-19 declaration under Section 564(b)(1) of the Act, 21 U.S.C. section 360bbb-3(b)(1), unless the authorization is terminated or revoked.  Performed at [facility], [address], [place], [place] [address]    WBC 01/15/2022 8.4  4.0 - 10.5 K/uL Final   RBC 01/15/2022 4.79  3.87 - 5.11 MIL/uL Final   Hemoglobin 01/15/2022 15.9 (H)  12.0 - 15.0 g/dL Final   HCT 08/65/7846 45.0  36.0 - 46.0 % Final   MCV  01/15/2022 93.9  80.0 - 100.0 fL Final   MCH 01/15/2022 33.2  26.0 - 34.0 pg Final   MCHC 01/15/2022 35.3  30.0 - 36.0 g/dL Final   RDW 40/98/1191 12.7  11.5 - 15.5 % Final   Platelets 01/15/2022 340  150 - 400 K/uL Final   nRBC 01/15/2022 0.0  0.0 - 0.2 % Final   Neutrophils Relative % 01/15/2022 60  % Final   Neutro Abs 01/15/2022 5.1  1.7 - 7.7 K/uL Final   Lymphocytes Relative 01/15/2022 32  % Final   Lymphs Abs 01/15/2022 2.7  0.7 - 4.0 K/uL Final   Monocytes Relative 01/15/2022 5  % Final   Monocytes Absolute 01/15/2022 0.4  0.1 - 1.0 K/uL Final   Eosinophils Relative 01/15/2022 1  % Final   Eosinophils Absolute 01/15/2022 0.1  0.0 - 0.5 K/uL Final   Basophils Relative 01/15/2022 1  % Final   Basophils Absolute 01/15/2022 0.0  0.0 - 0.1 K/uL Final   Immature Granulocytes 01/15/2022 1  % Final   Abs Immature Granulocytes 01/15/2022 0.05  0.00 - 0.07 K/uL Final   Performed at [facility], [address], [place], [place] [address]   Sodium 01/15/2022 140  135 - 145 mmol/L Final    Potassium 01/15/2022 4.0  3.5 - 5.1 mmol/L Final   Chloride 01/15/2022 105  98 - 111 mmol/L Final   CO2 01/15/2022 21 (L)  22 - 32 mmol/L Final   Glucose, Bld 01/15/2022 55 (L)  70 - 99 mg/dL Final   Glucose reference range applies only to samples taken after fasting for at least 8 hours.   BUN 01/15/2022 12  6 - 20 mg/dL Final   Creatinine, Ser 01/15/2022 0.76  0.44 - 1.00 mg/dL Final   Calcium 56/21/3086 9.9  8.9 - 10.3 mg/dL Final   Total Protein 57/84/6962 8.4 (H)  6.5 - 8.1 g/dL Final   Albumin 95/28/4132 4.8  3.5 - 5.0 g/dL Final   AST 44/11/270 32  15 - 41 U/L Final   ALT 01/15/2022 36  0 - 44 U/L Final   Alkaline Phosphatase 01/15/2022 43  38 - 126 U/L Final   Total Bilirubin 01/15/2022 1.1  0.3 - 1.2 mg/dL Final   GFR, Estimated 01/15/2022 >60  >60 mL/min Final   Comment: (NOTE) Calculated using the CKD-EPI Creatinine Equation (2021)    Anion gap 01/15/2022 14  5 - 15 Final   Performed at [facility], [address], [place], [place] [address]   Magnesium 01/15/2022 1.9  1.7 - 2.4 mg/dL Final   Performed at [facility], [address] [place], [place] [address]   Alcohol, Ethyl (B) 01/15/2022 <10  <10 mg/dL Final   Comment: (NOTE) Lowest detectable limit for serum alcohol is 10 mg/dL.  For medical purposes only. Performed at [facility], [address], [place], [place] [address]    Cholesterol 01/15/2022 171  0 - 200 mg/dL Final   Triglycerides 63/87/5643 10  <150 mg/dL Final   HDL 32/95/1884 36 (L)  >40 mg/dL Final   Total CHOL/HDL Ratio 01/15/2022 4.8  RATIO Final   VLDL 01/15/2022 2  0 - 40 mg/dL Final   LDL Cholesterol 01/15/2022 133 (H)  0 - 99 mg/dL Final   Comment:        Total Cholesterol/HDL:CHD Risk Coronary Heart Disease Risk Table  Men   Women  1/2 Average Risk   3.4   3.3  Average Risk       5.0   4.4  2 X Average Risk   9.6   7.1  3 X Average Risk  23.4   11.0        Use the calculated Patient Ratio above  and the CHD Risk Table to determine the patient's CHD Risk.        ATP III CLASSIFICATION (LDL):  <100     mg/dL   Optimal  161-096  mg/dL   Near or Above                    Optimal  130-159  mg/dL   Borderline  045-409  mg/dL   High  >811     mg/dL   Very High Performed at [facility], [address], [place], [place] [address]    TSH 01/15/2022 0.204 (L)  0.350 - 4.500 uIU/mL Final   Comment: Performed by a 3rd Generation assay with a functional sensitivity of <=0.01 uIU/mL. Performed at [facility], [address], [place], [place] [address]    Color, Urine 01/15/2022 AMBER (A)  YELLOW Final   BIOCHEMICALS MAY BE AFFECTED BY COLOR   APPearance 01/15/2022 CLOUDY (A)  CLEAR Final   Specific Gravity, Urine 01/15/2022 1.027  1.005 - 1.030 Final   pH 01/15/2022 5.0  5.0 - 8.0 Final   Glucose, UA 01/15/2022 NEGATIVE  NEGATIVE mg/dL Final   Hgb urine dipstick 01/15/2022 NEGATIVE  NEGATIVE Final   Bilirubin Urine 01/15/2022 NEGATIVE  NEGATIVE Final   Ketones, ur 01/15/2022 80 (A)  NEGATIVE mg/dL Final   Protein, ur 13/06/6577 30 (A)  NEGATIVE mg/dL Final   Nitrite 46/96/2952 NEGATIVE  NEGATIVE Final   Leukocytes,Ua 01/15/2022 NEGATIVE  NEGATIVE Final   RBC / HPF 01/15/2022 0-5  0 - 5 RBC/hpf Final   WBC, UA 01/15/2022 11-20  0 - 5 WBC/hpf Final   Bacteria, UA 01/15/2022 FEW (A)  NONE SEEN Final   Squamous Epithelial / LPF 01/15/2022 11-20  0 - 5 Final   Mucus 01/15/2022 PRESENT   Final   Hyaline Casts, UA 01/15/2022 PRESENT   Final   Performed at [facility], [address] [place], [place] [address]   Preg Test, Ur 01/15/2022 NEGATIVE  NEGATIVE Final   Comment:        THE SENSITIVITY OF THIS METHODOLOGY IS >20 mIU/mL. Performed at [facility], [address] [place], [place] [address]    POC Amphetamine UR 01/15/2022 None Detected  NONE DETECTED (Cut Off Level 1000 ng/mL) Final   POC Secobarbital (BAR) 01/15/2022 None Detected  NONE DETECTED (Cut  Off Level 300 ng/mL) Final   POC Buprenorphine (BUP) 01/15/2022 None Detected  NONE DETECTED (Cut Off Level 10 ng/mL) Final   POC Oxazepam (BZO) 01/15/2022 Positive (A)  NONE DETECTED (Cut Off Level 300 ng/mL) Final   POC Cocaine UR 01/15/2022 None Detected  NONE DETECTED (Cut Off Level 300 ng/mL) Final   POC Methamphetamine UR 01/15/2022 None Detected  NONE DETECTED (Cut Off Level 1000 ng/mL) Final   POC Morphine 01/15/2022 None Detected  NONE DETECTED (Cut Off Level 300 ng/mL) Final   POC Oxycodone UR 01/15/2022 None Detected  NONE DETECTED (Cut Off Level 100 ng/mL) Final   POC Methadone UR 01/15/2022 None Detected  NONE DETECTED (Cut Off Level 300 ng/mL) Final   POC  Marijuana UR 01/15/2022 Positive (A)  NONE DETECTED (Cut Off Level 50 ng/mL) Final   SARSCOV2ONAVIRUS 2 AG 01/15/2022 NEGATIVE  NEGATIVE Final   Comment: (NOTE) SARS-CoV-2 antigen NOT DETECTED.   Negative results are presumptive.  Negative results do not preclude SARS-CoV-2 infection and should not be used as the sole basis for treatment or other patient management decisions, including infection  control decisions, particularly in the presence of clinical signs and  symptoms consistent with COVID-19, or in those who have been in contact with the virus.  Negative results must be combined with clinical observations, patient history, and epidemiological information. The expected result is Negative.  Fact Sheet for Patients: https://www.jennings-kim.com/  Fact Sheet for Healthcare Providers: https://alexander-rogers.biz/  This test is not yet approved or cleared by the [place] FDA and  has been authorized for detection and/or diagnosis of SARS-CoV-2 by FDA under an Emergency Use Authorization (EUA).  This EUA will remain in effect (meaning this test can be used) for the duration of  the COV                          ID-19 declaration under Section 564(b)(1) of the Act, 21 U.S.C. section  360bbb-3(b)(1), unless the authorization is terminated or revoked sooner.     Preg Test, Ur 01/15/2022 NEGATIVE  NEGATIVE Final   Comment:        THE SENSITIVITY OF THIS METHODOLOGY IS >24 mIU/mL   Admission on 01/08/2022, Discharged on 01/09/2022  Component Date Value Ref Range Status   WBC 01/08/2022 9.2  4.0 - 10.5 K/uL Final   RBC 01/08/2022 4.65  3.87 - 5.11 MIL/uL Final   Hemoglobin 01/08/2022 15.3 (H)  12.0 - 15.0 g/dL Final   HCT 16/08/9603 43.5  36.0 - 46.0 % Final   MCV 01/08/2022 93.5  80.0 - 100.0 fL Final   MCH 01/08/2022 32.9  26.0 - 34.0 pg Final   MCHC 01/08/2022 35.2  30.0 - 36.0 g/dL Final   RDW 54/07/8118 12.5  11.5 - 15.5 % Final   Platelets 01/08/2022 352  150 - 400 K/uL Final   nRBC 01/08/2022 0.0  0.0 - 0.2 % Final   Neutrophils Relative % 01/08/2022 69  % Final   Neutro Abs 01/08/2022 6.4  1.7 - 7.7 K/uL Final   Lymphocytes Relative 01/08/2022 24  % Final   Lymphs Abs 01/08/2022 2.2  0.7 - 4.0 K/uL Final   Monocytes Relative 01/08/2022 5  % Final   Monocytes Absolute 01/08/2022 0.5  0.1 - 1.0 K/uL Final   Eosinophils Relative 01/08/2022 1  % Final   Eosinophils Absolute 01/08/2022 0.1  0.0 - 0.5 K/uL Final   Basophils Relative 01/08/2022 1  % Final   Basophils Absolute 01/08/2022 0.1  0.0 - 0.1 K/uL Final   Immature Granulocytes 01/08/2022 0  % Final   Abs Immature Granulocytes 01/08/2022 0.04  0.00 - 0.07 K/uL Final   Performed at [facility], [address], [place], [place] [address]   Sodium 01/08/2022 135  135 - 145 mmol/L Final   Potassium 01/08/2022 4.2  3.5 - 5.1 mmol/L Final   Chloride 01/08/2022 102  98 - 111 mmol/L Final   CO2 01/08/2022 21 (L)  22 - 32 mmol/L Final   Glucose, Bld 01/08/2022 145 (H)  70 - 99 mg/dL Final   Glucose reference range applies only to samples taken after fasting for at least 8 hours.   BUN 01/08/2022  8  6 - 20 mg/dL Final   Creatinine, Ser 01/08/2022 0.80  0.44 - 1.00 mg/dL Final   Calcium 29/51/8841 9.6   8.9 - 10.3 mg/dL Final   Total Protein 66/04/3015 7.9  6.5 - 8.1 g/dL Final   Albumin 11/16/3233 4.7  3.5 - 5.0 g/dL Final   AST 57/32/2025 40  15 - 41 U/L Final   ALT 01/08/2022 48 (H)  0 - 44 U/L Final   Alkaline Phosphatase 01/08/2022 51  38 - 126 U/L Final   Total Bilirubin 01/08/2022 0.7  0.3 - 1.2 mg/dL Final   GFR, Estimated 01/08/2022 >60  >60 mL/min Final   Comment: (NOTE) Calculated using the CKD-EPI Creatinine Equation (2021)    Anion gap 01/08/2022 12  5 - 15 Final   Performed at [facility], [address] [place], [place] [address]   Alcohol, Ethyl (B) 01/08/2022 <10  <10 mg/dL Final   Comment: (NOTE) Lowest detectable limit for serum alcohol is 10 mg/dL.  For medical purposes only. Performed at [facility], [address], [place], [place] [address]    Acetaminophen (Tylenol), Serum 01/08/2022 <10 (L)  10 - 30 ug/mL Final   Comment: (NOTE) Therapeutic concentrations vary significantly. A range of 10-30 ug/mL  may be an effective concentration for many patients. However, some  are best treated at concentrations outside of this range. Acetaminophen concentrations >150 ug/mL at 4 hours after ingestion  and >50 ug/mL at 12 hours after ingestion are often associated with  toxic reactions.  Performed at [facility], [address] [place], [place] [address]    Salicylate Lvl 01/08/2022 <7.0 (L)  7.0 - 30.0 mg/dL Final   Performed at [facility], [address] [place], [place] [address]   I-stat hCG, quantitative 01/09/2022 <5.0  <5 mIU/mL Final   Comment 3 01/09/2022          Final   Comment:   GEST. AGE      CONC.  (mIU/mL)   <=1 WEEK        5 - 50     2 WEEKS       50 - 500     3 WEEKS       100 - 10,000     4 WEEKS     1,000 - 30,000        FEMALE AND NON-PREGNANT FEMALE:     LESS THAN 5 mIU/mL    SARS Coronavirus 2 by RT PCR 01/09/2022 NEGATIVE  NEGATIVE Final   Comment: (NOTE) SARS-CoV-2 target nucleic acids are NOT  DETECTED.  The SARS-CoV-2 RNA is generally detectable in upper respiratory specimens during the acute phase of infection. The lowest concentration of SARS-CoV-2 viral copies this assay can detect is 138 copies/mL. A negative result does not preclude SARS-Cov-2 infection and should not be used as the sole basis for treatment or other patient management decisions. A negative result may occur with  improper specimen collection/handling, submission of specimen other than nasopharyngeal swab, presence of viral mutation(s) within the areas targeted by this assay, and inadequate number of viral copies(<138 copies/mL). A negative result must be combined with clinical observations, patient history, and epidemiological information. The expected result is Negative.  Fact Sheet for Patients:  BloggerCourse.com  Fact Sheet for Healthcare Providers:  SeriousBroker.it  This test is no  t yet approved or cleared by the [place] and  has been authorized for detection and/or diagnosis of SARS-CoV-2 by FDA under an Emergency Use Authorization (EUA). This EUA will remain  in effect (meaning this test can be used) for the duration of the COVID-19 declaration under Section 564(b)(1) of the Act, 21 U.S.C.section 360bbb-3(b)(1), unless the authorization is terminated  or revoked sooner.       Influenza A by PCR 01/09/2022 NEGATIVE  NEGATIVE Final   Influenza B by PCR 01/09/2022 NEGATIVE  NEGATIVE Final   Comment: (NOTE) The Xpert Xpress SARS-CoV-2/FLU/RSV plus assay is intended as an aid in the diagnosis of influenza from Nasopharyngeal swab specimens and should not be used as a sole basis for treatment. Nasal washings and aspirates are unacceptable for Xpert Xpress SARS-CoV-2/FLU/RSV testing.  Fact Sheet for Patients: BloggerCourse.com  Fact Sheet for Healthcare  Providers: SeriousBroker.it  This test is not yet approved or cleared by the [place] FDA and has been authorized for detection and/or diagnosis of SARS-CoV-2 by FDA under an Emergency Use Authorization (EUA). This EUA will remain in effect (meaning this test can be used) for the duration of the COVID-19 declaration under Section 564(b)(1) of the Act, 21 U.S.C. section 360bbb-3(b)(1), unless the authorization is terminated or revoked.  Performed at [facility], [address] [place], [place] [address]   Admission on 10/15/2021, Discharged on 10/17/2021  Component Date Value Ref Range Status   Sodium 10/16/2021 135  135 - 145 mmol/L Final   Potassium 10/16/2021 3.7  3.5 - 5.1 mmol/L Final   Chloride 10/16/2021 102  98 - 111 mmol/L Final   CO2 10/16/2021 23  22 - 32 mmol/L Final   Glucose, Bld 10/16/2021 78  70 - 99 mg/dL Final   Glucose reference range applies only to samples taken after fasting for at least 8 hours.   BUN 10/16/2021 8  6 - 20 mg/dL Final   Creatinine, Ser 10/16/2021 0.70  0.44 - 1.00 mg/dL Final   Calcium 09/81/1914 9.1  8.9 - 10.3 mg/dL Final   Total Protein 78/29/5621 7.0  6.5 - 8.1 g/dL Final   Albumin 30/86/5784 4.2  3.5 - 5.0 g/dL Final   AST 69/62/9528 43 (H)  15 - 41 U/L Final   ALT 10/16/2021 40  0 - 44 U/L Final   Alkaline Phosphatase 10/16/2021 44  38 - 126 U/L Final   Total Bilirubin 10/16/2021 1.4 (H)  0.3 - 1.2 mg/dL Final   GFR, Estimated 10/16/2021 >60  >60 mL/min Final   Comment: (NOTE) Calculated using the CKD-EPI Creatinine Equation (2021)    Anion gap 10/16/2021 10  5 - 15 Final   Performed at [facility], [address], [place], [place] [address]   WBC 10/16/2021 7.0  4.0 - 10.5 K/uL Final   RBC 10/16/2021 4.24  3.87 - 5.11 MIL/uL Final   Hemoglobin 10/16/2021 13.9  12.0 - 15.0 g/dL Final   HCT 40/08/2724 39.5  36.0 - 46.0 % Final   MCV 10/16/2021 93.2  80.0 - 100.0 fL Final   MCH 10/16/2021  32.8  26.0 - 34.0 pg Final   MCHC 10/16/2021 35.2  30.0 - 36.0 g/dL Final   RDW 36/64/4034 12.7  11.5 - 15.5 % Final   Platelets 10/16/2021 319  150 - 400 K/uL Final   nRBC 10/16/2021 0.0  0.0 - 0.2 % Final   Neutrophils Relative % 10/16/2021 59  % Final   Neutro  Abs 10/16/2021 4.1  1.7 - 7.7 K/uL Final   Lymphocytes Relative 10/16/2021 29  % Final   Lymphs Abs 10/16/2021 2.0  0.7 - 4.0 K/uL Final   Monocytes Relative 10/16/2021 6  % Final   Monocytes Absolute 10/16/2021 0.4  0.1 - 1.0 K/uL Final   Eosinophils Relative 10/16/2021 5  % Final   Eosinophils Absolute 10/16/2021 0.3  0.0 - 0.5 K/uL Final   Basophils Relative 10/16/2021 1  % Final   Basophils Absolute 10/16/2021 0.1  0.0 - 0.1 K/uL Final   Immature Granulocytes 10/16/2021 0  % Final   Abs Immature Granulocytes 10/16/2021 0.03  0.00 - 0.07 K/uL Final   Performed at [facility], [address], [place], [place] [address]   Alcohol, Ethyl (B) 10/16/2021 <10  <10 mg/dL Final   Comment: (NOTE) Lowest detectable limit for serum alcohol is 10 mg/dL.  For medical purposes only. Performed at [facility], [address], [place], [place] [address]    I-stat hCG, quantitative 10/16/2021 <5.0  <5 mIU/mL Final   Comment 3 10/16/2021          Final   Comment:   GEST. AGE      CONC.  (mIU/mL)   <=1 WEEK        5 - 50     2 WEEKS       50 - 500     3 WEEKS       100 - 10,000     4 WEEKS     1,000 - 30,000        FEMALE AND NON-PREGNANT FEMALE:     LESS THAN 5 mIU/mL    Lactic Acid, Venous 10/16/2021 0.7  0.5 - 1.9 mmol/L Final   Performed at [facility], [address], [place], [place] [address]   SARS Coronavirus 2 by RT PCR 10/16/2021 NEGATIVE  NEGATIVE Final   Comment: (NOTE) SARS-CoV-2 target nucleic acids are NOT DETECTED.  The SARS-CoV-2 RNA is generally detectable in upper respiratory specimens during the acute phase of infection. The lowest concentration of SARS-CoV-2 viral copies this assay can  detect is 138 copies/mL. A negative result does not preclude SARS-Cov-2 infection and should not be used as the sole basis for treatment or other patient management decisions. A negative result may occur with  improper specimen collection/handling, submission of specimen other than nasopharyngeal swab, presence of viral mutation(s) within the areas targeted by this assay, and inadequate number of viral copies(<138 copies/mL). A negative result must be combined with clinical observations, patient history, and epidemiological information. The expected result is Negative.  Fact Sheet for Patients:  BloggerCourse.com  Fact Sheet for Healthcare Providers:  SeriousBroker.it  This test is no                          t yet approved or cleared by the [place] FDA and  has been authorized for detection and/or diagnosis of SARS-CoV-2 by FDA under an Emergency Use Authorization (EUA). This EUA will remain  in effect (meaning this test can be used) for the duration of the COVID-19 declaration under Section 564(b)(1) of the Act, 21 U.S.C.section 360bbb-3(b)(1), unless the authorization is terminated  or revoked sooner.       Influenza A by PCR 10/16/2021 NEGATIVE  NEGATIVE Final   Influenza B by PCR 10/16/2021 NEGATIVE  NEGATIVE Final   Comment: (NOTE) The Xpert Xpress SARS-CoV-2/FLU/RSV plus  assay is intended as an aid in the diagnosis of influenza from Nasopharyngeal swab specimens and should not be used as a sole basis for treatment. Nasal washings and aspirates are unacceptable for Xpert Xpress SARS-CoV-2/FLU/RSV testing.  Fact Sheet for Patients: BloggerCourse.com  Fact Sheet for Healthcare Providers: SeriousBroker.it  This test is not yet approved or cleared by the [place] FDA and has been authorized for detection and/or diagnosis of SARS-CoV-2 by FDA under an Emergency  Use Authorization (EUA). This EUA will remain in effect (meaning this test can be used) for the duration of the COVID-19 declaration under Section 564(b)(1) of the Act, 21 U.S.C. section 360bbb-3(b)(1), unless the authorization is terminated or revoked.  Performed at [facility], [address], [place], [place] [address]   Admission on 10/09/2021, Discharged on 10/10/2021  Component Date Value Ref Range Status   SARS Coronavirus 2 by RT PCR 10/09/2021 NEGATIVE  NEGATIVE Final   Comment: (NOTE) SARS-CoV-2 target nucleic acids are NOT DETECTED.  The SARS-CoV-2 RNA is generally detectable in upper respiratory specimens during the acute phase of infection. The lowest concentration of SARS-CoV-2 viral copies this assay can detect is 138 copies/mL. A negative result does not preclude SARS-Cov-2 infection and should not be used as the sole basis for treatment or other patient management decisions. A negative result may occur with  improper specimen collection/handling, submission of specimen other than nasopharyngeal swab, presence of viral mutation(s) within the areas targeted by this assay, and inadequate number of viral copies(<138 copies/mL). A negative result must be combined with clinical observations, patient history, and epidemiological information. The expected result is Negative.  Fact Sheet for Patients:  BloggerCourse.com  Fact Sheet for Healthcare Providers:  SeriousBroker.it  This test is no                          t yet approved or cleared by the [place] FDA and  has been authorized for detection and/or diagnosis of SARS-CoV-2 by FDA under an Emergency Use Authorization (EUA). This EUA will remain  in effect (meaning this test can be used) for the duration of the COVID-19 declaration under Section 564(b)(1) of the Act, 21 U.S.C.section 360bbb-3(b)(1), unless the authorization is terminated  or revoked  sooner.       Influenza A by PCR 10/09/2021 NEGATIVE  NEGATIVE Final   Influenza B by PCR 10/09/2021 NEGATIVE  NEGATIVE Final   Comment: (NOTE) The Xpert Xpress SARS-CoV-2/FLU/RSV plus assay is intended as an aid in the diagnosis of influenza from Nasopharyngeal swab specimens and should not be used as a sole basis for treatment. Nasal washings and aspirates are unacceptable for Xpert Xpress SARS-CoV-2/FLU/RSV testing.  Fact Sheet for Patients: BloggerCourse.com  Fact Sheet for Healthcare Providers: SeriousBroker.it  This test is not yet approved or cleared by the [place] FDA and has been authorized for detection and/or diagnosis of SARS-CoV-2 by FDA under an Emergency Use Authorization (EUA). This EUA will remain in effect (meaning this test can be used) for the duration of the COVID-19 declaration under Section 564(b)(1) of the Act, 21 U.S.C. section 360bbb-3(b)(1), unless the authorization is terminated or revoked.  Performed at [facility], [address], [place], [place] [address]    WBC 10/09/2021 9.0  4.0 - 10.5 K/uL Final   RBC 10/09/2021 4.54  3.87 - 5.11 MIL/uL Final   Hemoglobin 10/09/2021 14.8  12.0 - 15.0 g/dL Final   HCT 82/95/6213 42.4  36.0 - 46.0 % Final   MCV 10/09/2021 93.4  80.0 - 100.0 fL Final   MCH 10/09/2021 32.6  26.0 - 34.0 pg Final   MCHC 10/09/2021 34.9  30.0 - 36.0 g/dL Final   RDW 16/08/9603 12.1  11.5 - 15.5 % Final   Platelets 10/09/2021 328  150 - 400 K/uL Final   nRBC 10/09/2021 0.0  0.0 - 0.2 % Final   Neutrophils Relative % 10/09/2021 71  % Final   Neutro Abs 10/09/2021 6.4  1.7 - 7.7 K/uL Final   Lymphocytes Relative 10/09/2021 23  % Final   Lymphs Abs 10/09/2021 2.1  0.7 - 4.0 K/uL Final   Monocytes Relative 10/09/2021 4  % Final   Monocytes Absolute 10/09/2021 0.4  0.1 - 1.0 K/uL Final   Eosinophils Relative 10/09/2021 1  % Final   Eosinophils Absolute 10/09/2021 0.1   0.0 - 0.5 K/uL Final   Basophils Relative 10/09/2021 1  % Final   Basophils Absolute 10/09/2021 0.1  0.0 - 0.1 K/uL Final   Immature Granulocytes 10/09/2021 0  % Final   Abs Immature Granulocytes 10/09/2021 0.02  0.00 - 0.07 K/uL Final   Performed at [facility], [address], [place], [place] [address]   Sodium 10/09/2021 136  135 - 145 mmol/L Final   Potassium 10/09/2021 3.7  3.5 - 5.1 mmol/L Final   Chloride 10/09/2021 99  98 - 111 mmol/L Final   CO2 10/09/2021 27  22 - 32 mmol/L Final   Glucose, Bld 10/09/2021 80  70 - 99 mg/dL Final   Glucose reference range applies only to samples taken after fasting for at least 8 hours.   BUN 10/09/2021 7  6 - 20 mg/dL Final   Creatinine, Ser 10/09/2021 0.64  0.44 - 1.00 mg/dL Final   Calcium 11/91/4782 10.1  8.9 - 10.3 mg/dL Final   Total Protein 95/62/1308 8.1  6.5 - 8.1 g/dL Final   Albumin 65/78/4696 4.7  3.5 - 5.0 g/dL Final   AST 29/52/8413 30  15 - 41 U/L Final   ALT 10/09/2021 44  0 - 44 U/L Final   Alkaline Phosphatase 10/09/2021 51  38 - 126 U/L Final   Total Bilirubin 10/09/2021 0.6  0.3 - 1.2 mg/dL Final   GFR, Estimated 10/09/2021 >60  >60 mL/min Final   Comment: (NOTE) Calculated using the CKD-EPI Creatinine Equation (2021)    Anion gap 10/09/2021 10  5 - 15 Final   Performed at [facility], [address], [place], [place] [address]   Hgb A1c MFr Bld 10/09/2021 5.0  4.8 - 5.6 % Final   Comment: (NOTE) Pre diabetes:          5.7%-6.4%  Diabetes:              >6.4%  Glycemic control for   <7.0% adults with diabetes    Mean Plasma Glucose 10/09/2021 96.8  mg/dL Final   Performed at [facility], [address], [place], [place] [address]   Alcohol, Ethyl (B) 10/09/2021 <10  <10 mg/dL Final   Comment: (NOTE) Lowest detectable limit for serum alcohol is 10 mg/dL.  For medical purposes only. Performed at [facility], [address], [place], [place] [address]    Cholesterol 10/09/2021 198  0  - 200 mg/dL Final   Triglycerides 03/47/4259 63  <150 mg/dL Final   HDL 56/38/7564 54  >40 mg/dL Final   Total CHOL/HDL Ratio 10/09/2021  3.7  RATIO Final   VLDL 10/09/2021 13  0 - 40 mg/dL Final   LDL Cholesterol 10/09/2021 131 (H)  0 - 99 mg/dL Final   Comment:        Total Cholesterol/HDL:CHD Risk Coronary Heart Disease Risk Table                     Men   Women  1/2 Average Risk   3.4   3.3  Average Risk       5.0   4.4  2 X Average Risk   9.6   7.1  3 X Average Risk  23.4   11.0        Use the calculated Patient Ratio above and the CHD Risk Table to determine the patient's CHD Risk.        ATP III CLASSIFICATION (LDL):  <100     mg/dL   Optimal  [phone]  mg/dL   Near or Above                    Optimal  130-159  mg/dL   Borderline  [phone]  mg/dL   High  >956>190     mg/dL   Very High Performed at [facility], [address] [place], [place] [address]    TSH 10/09/2021 1.972  0.350 - 4.500 uIU/mL Final   Comment: Performed by a 3rd Generation assay with a functional sensitivity of <=0.01 uIU/mL. Performed at [facility], [address] [place], [place] [address]    POC Amphetamine UR 10/09/2021 None Detected  NONE DETECTED (Cut Off Level 1000 ng/mL) Final   POC Secobarbital (BAR) 10/09/2021 None Detected  NONE DETECTED (Cut Off Level 300 ng/mL) Final   POC Buprenorphine (BUP) 10/09/2021 None Detected  NONE DETECTED (Cut Off Level 10 ng/mL) Final   POC Oxazepam (BZO) 10/09/2021 None Detected  NONE DETECTED (Cut Off Level 300 ng/mL) Final   POC Cocaine UR 10/09/2021 None Detected  NONE DETECTED (Cut Off Level 300 ng/mL) Final   POC Methamphetamine UR 10/09/2021 None Detected  NONE DETECTED (Cut Off Level 1000 ng/mL) Final   POC Morphine 10/09/2021 None Detected  NONE DETECTED (Cut Off Level 300 ng/mL) Final   POC Oxycodone UR 10/09/2021 None Detected  NONE DETECTED (Cut Off Level 100 ng/mL) Final   POC Methadone UR 10/09/2021 None Detected  NONE DETECTED (Cut Off  Level 300 ng/mL) Final   POC Marijuana UR 10/09/2021 Positive (A)  NONE DETECTED (Cut Off Level 50 ng/mL) Final   SARS Coronavirus 2 Ag 10/09/2021 Negative  Negative Preliminary   Preg Test, Ur 10/09/2021 NEGATIVE  NEGATIVE Final   Comment:        THE SENSITIVITY OF THIS METHODOLOGY IS >24 mIU/mL    SARSCOV2ONAVIRUS 2 AG 10/09/2021 NEGATIVE  NEGATIVE Final   Comment: (NOTE) SARS-CoV-2 antigen NOT DETECTED.   Negative results are presumptive.  Negative results do not preclude SARS-CoV-2 infection and should not be used as the sole basis for treatment or other patient management decisions, including infection  control decisions, particularly in the presence of clinical signs and  symptoms consistent with COVID-19, or in those who have been in contact with the virus.  Negative results must be combined with clinical observations, patient history, and epidemiological information. The expected result is Negative.  Fact Sheet for Patients: https://www.jennings-kim.com/https://www.fda.gov/media/141569/download  Fact Sheet for Healthcare Providers: https://alexander-rogers.biz/https://www.fda.gov/media/141568/download  This test is not yet approved or cleared by the [place]  FDA and  has been authorized for detection and/or diagnosis of SARS-CoV-2 by FDA under an Emergency Use Authorization (EUA).  This EUA will remain in effect (meaning this test can be used) for the duration of  the COV                          ID-19 declaration under Section 564(b)(1) of the Act, 21 U.S.C. section 360bbb-3(b)(1), unless the authorization is terminated or revoked sooner.    Admission on 10/08/2021, Discharged on 10/09/2021  Component Date Value Ref Range Status   Sodium 10/08/2021 136  135 - 145 mmol/L Final   Potassium 10/08/2021 3.9  3.5 - 5.1 mmol/L Final   Chloride 10/08/2021 103  98 - 111 mmol/L Final   CO2 10/08/2021 23  22 - 32 mmol/L Final   Glucose, Bld 10/08/2021 102 (H)  70 - 99 mg/dL Final   Glucose reference range applies only to  samples taken after fasting for at least 8 hours.   BUN 10/08/2021 11  6 - 20 mg/dL Final   Creatinine, Ser 10/08/2021 0.68  0.44 - 1.00 mg/dL Final   Calcium 16/08/9603 9.6  8.9 - 10.3 mg/dL Final   Total Protein 54/07/8118 8.2 (H)  6.5 - 8.1 g/dL Final   Albumin 14/78/2956 4.8  3.5 - 5.0 g/dL Final   AST 21/30/8657 33  15 - 41 U/L Final   ALT 10/08/2021 52 (H)  0 - 44 U/L Final   Alkaline Phosphatase 10/08/2021 55  38 - 126 U/L Final   Total Bilirubin 10/08/2021 0.8  0.3 - 1.2 mg/dL Final   GFR, Estimated 10/08/2021 >60  >60 mL/min Final   Comment: (NOTE) Calculated using the CKD-EPI Creatinine Equation (2021)    Anion gap 10/08/2021 10  5 - 15 Final   Performed at [facility], [address] [place], [place] [address]   Acetaminophen (Tylenol), Serum 10/08/2021 <10 (L)  10 - 30 ug/mL Final   Comment: (NOTE) Therapeutic concentrations vary significantly. A range of 10-30 ug/mL  may be an effective concentration for many patients. However, some  are best treated at concentrations outside of this range. Acetaminophen concentrations >150 ug/mL at 4 hours after ingestion  and >50 ug/mL at 12 hours after ingestion are often associated with  toxic reactions.  Performed at [facility], [address] [place], [place] [address]    Alcohol, Ethyl (B) 10/08/2021 <10  <10 mg/dL Final   Comment: (NOTE) Lowest detectable limit for serum alcohol is 10 mg/dL.  For medical purposes only. Performed at [facility], [address], [place], [place] [address]    Salicylate Lvl 10/08/2021 <7.0 (L)  7.0 - 30.0 mg/dL Final   Performed at [facility], [address] [place], [place] [address]   WBC 10/08/2021 8.5  4.0 - 10.5 K/uL Final   RBC 10/08/2021 4.35  3.87 - 5.11 MIL/uL Final   Hemoglobin 10/08/2021 14.3  12.0 - 15.0 g/dL Final   HCT 72/53/6644 41.0  36.0 - 46.0 % Final   MCV 10/08/2021 94.3  80.0 - 100.0 fL Final   MCH 10/08/2021 32.9  26.0 - 34.0 pg Final   MCHC 10/08/2021 34.9  30.0  - 36.0 g/dL Final   RDW 03/47/4259 12.2  11.5 - 15.5 % Final   Platelets 10/08/2021 312  150 - 400 K/uL Final   nRBC 10/08/2021 0.0  0.0 - 0.2 % Final   Neutrophils Relative % 10/08/2021 72  %  Final   Neutro Abs 10/08/2021 6.1  1.7 - 7.7 K/uL Final   Lymphocytes Relative 10/08/2021 22  % Final   Lymphs Abs 10/08/2021 1.8  0.7 - 4.0 K/uL Final   Monocytes Relative 10/08/2021 4  % Final   Monocytes Absolute 10/08/2021 0.4  0.1 - 1.0 K/uL Final   Eosinophils Relative 10/08/2021 1  % Final   Eosinophils Absolute 10/08/2021 0.1  0.0 - 0.5 K/uL Final   Basophils Relative 10/08/2021 1  % Final   Basophils Absolute 10/08/2021 0.0  0.0 - 0.1 K/uL Final   Immature Granulocytes 10/08/2021 0  % Final   Abs Immature Granulocytes 10/08/2021 0.03  0.00 - 0.07 K/uL Final   Performed at [facility], [address] [place], [place] [address]   I-stat hCG, quantitative 10/08/2021 <5.0  <5 mIU/mL Final   Comment 3 10/08/2021          Final   Comment:   GEST. AGE      CONC.  (mIU/mL)   <=1 WEEK        5 - 50     2 WEEKS       50 - 500     3 WEEKS       100 - 10,000     4 WEEKS     1,000 - 30,000        FEMALE AND NON-PREGNANT FEMALE:     LESS THAN 5 mIU/mL    Opiates 10/08/2021 NONE DETECTED  NONE DETECTED Final   Cocaine 10/08/2021 NONE DETECTED  NONE DETECTED Final   Benzodiazepines 10/08/2021 NONE DETECTED  NONE DETECTED Final   Amphetamines 10/08/2021 NONE DETECTED  NONE DETECTED Final   Tetrahydrocannabinol 10/08/2021 POSITIVE (A)  NONE DETECTED Final   Barbiturates 10/08/2021 NONE DETECTED  NONE DETECTED Final   Comment: (NOTE) DRUG SCREEN FOR MEDICAL PURPOSES ONLY.  IF CONFIRMATION IS NEEDED FOR ANY PURPOSE, NOTIFY LAB WITHIN 5 DAYS.  LOWEST DETECTABLE LIMITS FOR URINE DRUG SCREEN Drug Class                     Cutoff (ng/mL) Amphetamine and metabolites    1000 Barbiturate and metabolites    200 Benzodiazepine                 200 Tricyclics and metabolites     300 Opiates and  metabolites        300 Cocaine and metabolites        300 THC                            50 Performed at [facility], [address] [place], [place] [address]    Color, Urine 10/08/2021 YELLOW  YELLOW Final   APPearance 10/08/2021 HAZY (A)  CLEAR Final   Specific Gravity, Urine 10/08/2021 1.025  1.005 - 1.030 Final   pH 10/08/2021 6.0  5.0 - 8.0 Final   Glucose, UA 10/08/2021 NEGATIVE  NEGATIVE mg/dL Final   Hgb urine dipstick 10/08/2021 NEGATIVE  NEGATIVE Final   Bilirubin Urine 10/08/2021 NEGATIVE  NEGATIVE Final   Ketones, ur 10/08/2021 NEGATIVE  NEGATIVE mg/dL Final   Protein, ur 09/81/1914 NEGATIVE  NEGATIVE mg/dL Final   Nitrite 78/29/5621 NEGATIVE  NEGATIVE Final   Leukocytes,Ua 10/08/2021 SMALL (A)  NEGATIVE Final   Performed at [facility], [address], [place], [place] [address]   RBC / HPF 10/08/2021 0-5  0 - 5 RBC/hpf  Final   WBC, UA 10/08/2021 6-10  0 - 5 WBC/hpf Final   Bacteria, UA 10/08/2021 MANY (A)  NONE SEEN Final   Squamous Epithelial / LPF 10/08/2021 21-50  0 - 5 Final   Trichomonas, UA 10/08/2021 PRESENT (A)  NONE SEEN Final   Ca Oxalate Crys, UA 10/08/2021 PRESENT   Final   Performed at [facility], [address] [place], [place] [address]   RPR Ser Ql 10/08/2021 NON REACTIVE  NON REACTIVE Final   Performed at [facility], [address] [place], [place] [address]   HIV Screen 4th Generation wRfx 10/08/2021 Non Reactive  Non Reactive Final   Performed at [facility], [address] [place], [place] [address]    Blood Alcohol level:  Lab Results  Component Value Date   ETH <10 01/15/2022   ETH <10 01/08/2022    Metabolic Disorder Labs: Lab Results  Component Value Date   HGBA1C 5.0 10/09/2021   MPG 96.8 10/09/2021   No results found for: PROLACTIN Lab Results  Component Value Date   CHOL 171 01/15/2022   TRIG 10 01/15/2022   HDL 36 (L) 01/15/2022   CHOLHDL 4.8 01/15/2022   VLDL 2 01/15/2022   LDLCALC 133 (H) 01/15/2022    LDLCALC 131 (H) 10/09/2021    Therapeutic Lab Levels: No results found for: LITHIUM No results found for: VALPROATE No components found for:  CBMZ  Physical Findings   Flowsheet Row ED from 01/15/2022 in [facility] ED from 01/08/2022 in [facility] EMERG[facility]Y [facility] ED from 10/17/2021 in [facility]  C-SSRS RISK CATEGORY Moderate Risk Moderate Risk High Risk        Musculoskeletal  Strength & Muscle Tone: within normal limits Gait & Station: normal Patient leans: N/A  Psychiatric Specialty Exam  Presentation  General Appearance: Appropriate for Environment  Eye Contact:Fair  Speech:Clear and Coherent; Normal Rate  Speech Volume:Normal  Handedness:Right   Mood and Affect  Mood:Irritable  Affect:Congruent   Thought Process  Thought Processes:Coherent  Descriptions of Associations:Intact  Orientation:Full (Time, Place and Person)  Thought Content:Logical; Paranoid Ideation  Diagnosis of Schizophrenia or Schizoaffective disorder in past: No  Duration of Psychotic Symptoms: Less than six months   Hallucinations:Hallucinations: None  Ideas of Reference:Paranoia  Suicidal Thoughts:Suicidal Thoughts: No  Homicidal Thoughts:Homicidal Thoughts: No   Sensorium  Memory:Immediate Good  Judgment:Poor  Insight:Lacking   Executive Functions  Concentration:Fair  Attention Span:Fair  Recall:Fair  Fund of Knowledge:Good  Language:Good   Psychomotor Activity  Psychomotor Activity:Psychomotor Activity: Normal   Assets  Assets:Communication Skills; Housing; Social Support   Sleep  Sleep:Sleep: Fair   Nutritional Assessment (For OBS and FBC admissions only) Has the patient had a weight loss or gain of 10 pounds or more in the last 3 months?: No Has the patient had a decrease in food intake/or appetite?: No Does the patient have dental problems?: No Does the patient  have eating habits or behaviors that may be indicators of an eating disorder including binging or inducing vomiting?: No Has the patient recently lost weight without trying?: 0 Has the patient been eating poorly because of a decreased appetite?: 0 Malnutrition Screening Tool Score: 0    Physical Exam  Physical Exam Vitals and nursing note reviewed.  Constitutional:      Appearance: Normal appearance. She is well-developed.  HENT:     Head: Normocephalic and atraumatic.     Nose: Nose normal.  Cardiovascular:     Rate and Rhythm: Normal rate.  Pulmonary:     Effort: Pulmonary effort is normal.  Musculoskeletal:        General: Normal range of motion.     Cervical back: Normal range of motion.  Neurological:     Mental Status: She is alert and oriented to person, place, and time.  Psychiatric:        Attention and Perception: Attention normal.        Mood and Affect: Affect is labile.        Speech: Speech normal.        Behavior: Behavior is uncooperative.        Thought Content: Thought content is paranoid.        Cognition and Memory: Cognition normal.   Review of Systems  Constitutional: Negative.   HENT: Negative.    Eyes: Negative.   Respiratory: Negative.    Cardiovascular: Negative.   Gastrointestinal: Negative.   Genitourinary: Negative.   Musculoskeletal: Negative.   Skin: Negative.   Neurological: Negative.   Endo/Heme/Allergies: Negative.   Psychiatric/Behavioral:  The patient is nervous/anxious.   Blood pressure 104/65, pulse 83, temperature (!) 97.5 F (36.4 C), temperature source Oral, resp. rate 16, SpO2 99 %. There is no height or weight on file to calculate BMI.  Treatment Plan Summary: Daily contact with patient to assess and evaluate symptoms and progress in treatment  Agitation protocol initiated including: -Olanzapine Zydis 5 mg every 8 hours as needed/agitation -Lorazepam 1 mg as needed as needed/anxiety or severe agitation for 1  dose -Ziprasidone 20 mg IM as needed/agitation for 1 dose  Patient reviewed with Dr. Nelly Rout.  She remains under involuntary commitment petition at this time.  Continue to recommend inpatient psychiatric admission.  Lenard Lance, FNP 01/16/2022 9:18 AM

## 2022-01-16 NOTE — ED Notes (Signed)
Pt sitting up in bed, looking around unit. A&O to person. RN asked if pt knew the time or where she is currently at and pt states "no, why?" Pt denies current SI/HI/AVH. No signs of acute distress noted. Will continue to monitor for safety.  ?

## 2022-01-16 NOTE — Progress Notes (Signed)
Per Ardell Isaacs, patient meets criteria for inpatient treatment. There are no available or appropriate beds at [facility] today. CSW faxed referrals to the following facilities for review: ? ?[facility]  Pending - No Request Sent N/A [address] [place] [address] [phone] --  ?[facility]  Pending - No Request Sent N/A [address], [place] [address] [phone] --  ?[facility]  Pending - No Request [facility] [address] [address] [place] [address] [phone] --  ?[facility]  Pending - No Request Sent N/A [address] [place] [address] [phone] --  ?[facility]  Pending - No Request Sent N/A [address] [place] [address] [phone] --  ?[facility]  Pending - No Request Sent N/A [address] [place], [place] [address] [phone] --  ?[facility]  Pending - No Request Sent N/A [address] [place] [address] [phone] --  ?[facility]  Pending - No Request Sent N/A [address], [place] [address] [phone] --  ?[facility]  Pending - No Request Sent N/A [address] [place] [address] [phone] --  ?[facility]  Pending - No Request Sent N/A [address] [place] [address] [phone] --  ?[facility]  Pending - No Request Sent N/A [address], [place] [address] [phone] --  ?[facility]  Pending - No Request Sent N/A [address] [place] [address] [phone] --  ?[facility]  Pending - No Request Sent N/A [name] [place] [address] [phone] --   ?[facility]  Pending - No Request Sent N/A [address] [place] [address] [phone] --  ?[facility]  Pending - No Request Sent N/A [address] [place], [place] [address] [phone] --  ?[facility]  Pending - No Request Sent N/A [address] [place] [address] [phone] --  ? ? ?TTS will continue to seek bed placement. ? ?Crissie Reese, MSW, LCSW-A, LCAS-A ?Phone: [phone] ?Disposition/TOC ? ?

## 2022-01-17 LAB — PROLACTIN: Prolactin: 2.6 ng/mL — ABNORMAL LOW (ref 4.8–23.3)

## 2022-01-17 MED ORDER — LORAZEPAM 2 MG/ML IJ SOLN: 1.0000 mg | Freq: Four times a day (QID) | INTRAMUSCULAR | Status: DC | PRN

## 2022-01-17 MED ORDER — OLANZAPINE 5 MG PO TABS
5.0000 mg | ORAL_TABLET | Freq: Every day | ORAL | Status: DC
  Administered 2022-01-18: 5 mg via ORAL
  Filled 2022-01-17: qty 1

## 2022-01-17 MED ORDER — OLANZAPINE 2.5 MG PO TABS: 2.5000 mg | ORAL_TABLET | Freq: Every day | ORAL | Status: DC

## 2022-01-17 MED ORDER — OLANZAPINE 10 MG PO TBDP
10.0000 mg | ORAL_TABLET | Freq: Every day | ORAL | Status: DC
  Administered 2022-01-17: 10 mg via ORAL
  Filled 2022-01-17: qty 1

## 2022-01-17 MED ORDER — LORAZEPAM 1 MG PO TABS
1.0000 mg | ORAL_TABLET | Freq: Four times a day (QID) | ORAL | Status: DC | PRN
  Administered 2022-01-17: 1 mg via ORAL
  Filled 2022-01-17: qty 1

## 2022-01-17 NOTE — ED Notes (Signed)
Pt refused breakfast this morning.

## 2022-01-17 NOTE — Progress Notes (Signed)
CSW followed-up with [facility] in reference to a referral faxed yesterday for placement. It was reported by [place], RN that the patient was denied due to no appropriate beds available at this time.  ? ?Crissie Reese, MSW, LCSW-A, LCAS-A ?Phone: [phone] ?Disposition/TOC ?  ?

## 2022-01-17 NOTE — ED Notes (Signed)
Was able to get pt to take a shower. ?

## 2022-01-17 NOTE — ED Provider Notes (Addendum)
Behavioral Health Progress Note  Date and Time: 01/17/2022 12:54 PM Name: Barbara Gordon MRN:  [id number]  Subjective:  "Why are you asking me questions?" Patient states, "I don't think so" when asked if she is experiencing auditory or visual hallucinations. She denies paranoia. She denies SI/HI. Patient denies using drugs.   Patient seen and evaluated face to face by this provided. On evaluation, patient thoughts or somewhat disorganized. She is unable to recall why she brought here for an evaluations. Her mood is irritable and she is noted to be easily agitated. She is noted to repeatedly look under the covers during the assessment as if she is looking for something. When asked if she is looking for something she does not answer the question. She appears to be paranoid. She is heard telling the staff that they are trying to kill her. She is heard cursing in a loud tone when asking for water.   Per chart review: patient has a hx of presenting to the ED due to altered mental status in the context polysubstance use. Patient has a documented hx of schizophrenia, bipolar, substance abuse and TBI from a fall in 2021.  Diagnosis:  Final diagnoses:  Brief psychotic disorder ([place])    Total Time spent with patient: 20 minutes  Past Psychiatric History:Substance-induced psychosis, substance-induced mood disorder, bipolar disorder  Per chart review (2021):  1. Traumatic brain injury with epidural hematoma, temporal bone fracture 2. Perforated left tympanic membrane 3. History of bipolar disorder, schizophrenia, substance abuse  Past Medical History: No past medical history on file. No past surgical history on file. Family History: No family history on file. Family Psychiatric  History: No hx reported.  Social History:  Social History   Substance and Sexual Activity  Alcohol Use None     Social History   Substance and Sexual Activity  Drug Use Never    Social History   Socioeconomic  History   Marital status: Single    Spouse name: Not on file   Number of children: Not on file   Years of education: Not on file   Highest education level: Not on file  Occupational History   Not on file  Tobacco Use   Smoking status: Some Days    Types: Cigarettes   Smokeless tobacco: Never  Substance and Sexual Activity   Alcohol use: Not on file   Drug use: Never   Sexual activity: Never  Other Topics Concern   Not on file  Social History Narrative   Not on file   Social Determinants of Health   Financial Resource Strain: Not on file  Food Insecurity: Not on file  Transportation Needs: Not on file  Physical Activity: Not on file  Stress: Not on file  Social Connections: Not on file   SDOH:  SDOH Screenings   Alcohol Screen: Not on file  Depression (PHQ2-9): Not on file  Financial Resource Strain: Not on file  Food Insecurity: Not on file  Housing: Not on file  Physical Activity: Not on file  Social Connections: Not on file  Stress: Not on file  Tobacco Use: High Risk   Smoking Tobacco Use: Some Days   Smokeless Tobacco Use: Never   Passive Exposure: Not on file  Transportation Needs: Not on file   Additional Social History:    Pain Medications: See MAR Prescriptions: See MAR Over the Counter: See MAR History of alcohol / drug use?:  (Pt denies.)   Current Medications:  Current Facility-Administered Medications  Medication Dose Route Frequency Provider Last Rate Last Admin   acetaminophen (TYLENOL) tablet 650 mg  650 mg Oral Q6H PRN Lucky Rathke, FNP       alum & mag hydroxide-simeth (MAALOX/MYLANTA) 200-200-20 MG/5ML suspension 30 mL  30 mL Oral Q4H PRN Lucky Rathke, FNP       hydrOXYzine (ATARAX) tablet 25 mg  25 mg Oral TID PRN Lucky Rathke, FNP       LORazepam (ATIVAN) tablet 1 mg  1 mg Oral Q6H PRN Nils Thor L, NP       Or   LORazepam (ATIVAN) injection 1 mg  1 mg Intramuscular Q6H PRN Reuel Lamadrid L, NP       OLANZapine zydis (ZYPREXA)  disintegrating tablet 5 mg  5 mg Oral Q8H PRN Lucky Rathke, FNP       And   LORazepam (ATIVAN) tablet 1 mg  1 mg Oral PRN Lucky Rathke, FNP       And   ziprasidone (GEODON) injection 20 mg  20 mg Intramuscular PRN Lucky Rathke, FNP       magnesium hydroxide (MILK OF MAGNESIA) suspension 30 mL  30 mL Oral Daily PRN Lucky Rathke, FNP       OLANZapine zydis (ZYPREXA) disintegrating tablet 5 mg  5 mg Oral BID Lucky Rathke, FNP   5 mg at 01/17/22 0911   traZODone (DESYREL) tablet 50 mg  50 mg Oral QHS PRN Lucky Rathke, FNP   50 mg at 01/16/22 2106   No current outpatient medications on file.    Labs  Lab Results:  Admission on 01/15/2022  Component Date Value Ref Range Status   SARS Coronavirus 2 by RT PCR 01/15/2022 NEGATIVE  NEGATIVE Final   Comment: (NOTE) SARS-CoV-2 target nucleic acids are NOT DETECTED.  The SARS-CoV-2 RNA is generally detectable in upper respiratory specimens during the acute phase of infection. The lowest concentration of SARS-CoV-2 viral copies this assay can detect is 138 copies/mL. A negative result does not preclude SARS-Cov-2 infection and should not be used as the sole basis for treatment or other patient management decisions. A negative result may occur with  improper specimen collection/handling, submission of specimen other than nasopharyngeal swab, presence of viral mutation(s) within the areas targeted by this assay, and inadequate number of viral copies(<138 copies/mL). A negative result must be combined with clinical observations, patient history, and epidemiological information. The expected result is Negative.  Fact Sheet for Patients:  EntrepreneurPulse.com.au  Fact Sheet for Healthcare Providers:  IncredibleEmployment.be  This test is no                          t yet approved or cleared by the [place] FDA and  has been authorized for detection and/or diagnosis of SARS-CoV-2 by FDA under an  Emergency Use Authorization (EUA). This EUA will remain  in effect (meaning this test can be used) for the duration of the COVID-19 declaration under Section 564(b)(1) of the Act, 21 U.S.C.section 360bbb-3(b)(1), unless the authorization is terminated  or revoked sooner.       Influenza A by PCR 01/15/2022 NEGATIVE  NEGATIVE Final   Influenza B by PCR 01/15/2022 NEGATIVE  NEGATIVE Final   Comment: (NOTE) The Xpert Xpress SARS-CoV-2/FLU/RSV plus assay is intended as an aid in the diagnosis of influenza from Nasopharyngeal swab specimens and should not be used as a sole basis for treatment. Nasal washings  and aspirates are unacceptable for Xpert Xpress SARS-CoV-2/FLU/RSV testing.  Fact Sheet for Patients: EntrepreneurPulse.com.au  Fact Sheet for Healthcare Providers: IncredibleEmployment.be  This test is not yet approved or cleared by the [place] FDA and has been authorized for detection and/or diagnosis of SARS-CoV-2 by FDA under an Emergency Use Authorization (EUA). This EUA will remain in effect (meaning this test can be used) for the duration of the COVID-19 declaration under Section 564(b)(1) of the Act, 21 U.S.C. section 360bbb-3(b)(1), unless the authorization is terminated or revoked.  Performed at [facility], [place] [address], [place], [place] [address]    WBC 01/15/2022 8.4  4.0 - 10.5 K/uL Final   RBC 01/15/2022 4.79  3.87 - 5.11 MIL/uL Final   Hemoglobin 01/15/2022 15.9 (H)  12.0 - 15.0 g/dL Final   HCT 01/15/2022 45.0  36.0 - 46.0 % Final   MCV 01/15/2022 93.9  80.0 - 100.0 fL Final   MCH 01/15/2022 33.2  26.0 - 34.0 pg Final   MCHC 01/15/2022 35.3  30.0 - 36.0 g/dL Final   RDW 01/15/2022 12.7  11.5 - 15.5 % Final   Platelets 01/15/2022 340  150 - 400 K/uL Final   nRBC 01/15/2022 0.0  0.0 - 0.2 % Final   Neutrophils Relative % 01/15/2022 60  % Final   Neutro Abs 01/15/2022 5.1  1.7 - 7.7 K/uL Final   Lymphocytes  Relative 01/15/2022 32  % Final   Lymphs Abs 01/15/2022 2.7  0.7 - 4.0 K/uL Final   Monocytes Relative 01/15/2022 5  % Final   Monocytes Absolute 01/15/2022 0.4  0.1 - 1.0 K/uL Final   Eosinophils Relative 01/15/2022 1  % Final   Eosinophils Absolute 01/15/2022 0.1  0.0 - 0.5 K/uL Final   Basophils Relative 01/15/2022 1  % Final   Basophils Absolute 01/15/2022 0.0  0.0 - 0.1 K/uL Final   Immature Granulocytes 01/15/2022 1  % Final   Abs Immature Granulocytes 01/15/2022 0.05  0.00 - 0.07 K/uL Final   Performed at [facility], [place] [address] [place], [place] [address]   Sodium 01/15/2022 140  135 - 145 mmol/L Final   Potassium 01/15/2022 4.0  3.5 - 5.1 mmol/L Final   Chloride 01/15/2022 105  98 - 111 mmol/L Final   CO2 01/15/2022 21 (L)  22 - 32 mmol/L Final   Glucose, Bld 01/15/2022 55 (L)  70 - 99 mg/dL Final   Glucose reference range applies only to samples taken after fasting for at least 8 hours.   BUN 01/15/2022 12  6 - 20 mg/dL Final   Creatinine, Ser 01/15/2022 0.76  0.44 - 1.00 mg/dL Final   Calcium 01/15/2022 9.9  8.9 - 10.3 mg/dL Final   Total Protein 01/15/2022 8.4 (H)  6.5 - 8.1 g/dL Final   Albumin 01/15/2022 4.8  3.5 - 5.0 g/dL Final   AST 01/15/2022 32  15 - 41 U/L Final   ALT 01/15/2022 36  0 - 44 U/L Final   Alkaline Phosphatase 01/15/2022 43  38 - 126 U/L Final   Total Bilirubin 01/15/2022 1.1  0.3 - 1.2 mg/dL Final   GFR, Estimated 01/15/2022 >60  >60 mL/min Final   Comment: (NOTE) Calculated using the CKD-EPI Creatinine Equation (2021)    Anion gap 01/15/2022 14  5 - 15 Final   Performed at [place] [address] [place], [place] [address]   Magnesium 01/15/2022 1.9  1.7 - 2.4 mg/dL Final  Performed at [facility], [place] [address] [place], [place] [address]   Alcohol, Ethyl (B) 01/15/2022 <10  <10 mg/dL Final   Comment: (NOTE) Lowest detectable limit for serum alcohol is 10 mg/dL.  For medical purposes only. Performed at [facility], [place] [address] [place], [place] [address]    Cholesterol 01/15/2022 171  0 - 200 mg/dL Final   Triglycerides 01/15/2022 10  <150 mg/dL Final   HDL 01/15/2022 36 (L)  >40 mg/dL Final   Total CHOL/HDL Ratio 01/15/2022 4.8  RATIO Final   VLDL 01/15/2022 2  0 - 40 mg/dL Final   LDL Cholesterol 01/15/2022 133 (H)  0 - 99 mg/dL Final   Comment:        Total Cholesterol/HDL:CHD Risk Coronary Heart Disease Risk Table                     Men   Women  1/2 Average Risk   3.4   3.3  Average Risk       5.0   4.4  2 X Average Risk   9.6   7.1  3 X Average Risk  23.4   11.0        Use the calculated Patient Ratio above and the CHD Risk Table to determine the patient's CHD Risk.        ATP III CLASSIFICATION (LDL):  <100     mg/dL   Optimal  100-129  mg/dL   Near or Above                    Optimal  130-159  mg/dL   Borderline  160-189  mg/dL   High  >190     mg/dL   Very High Performed at [place] [address] [place], [place] [address]    TSH 01/15/2022 0.204 (L)  0.350 - 4.500 uIU/mL Final   Comment: Performed by a 3rd Generation assay with a functional sensitivity of <=0.01 uIU/mL. Performed at [facility], [place] [address], [place], [place] [address]    Color, Urine 01/15/2022 AMBER (A)  YELLOW Final   BIOCHEMICALS MAY BE AFFECTED BY COLOR   APPearance 01/15/2022 CLOUDY (A)  CLEAR Final   Specific Gravity, Urine 01/15/2022 1.027  1.005 - 1.030 Final   pH 01/15/2022 5.0  5.0 - 8.0 Final   Glucose, UA 01/15/2022 NEGATIVE  NEGATIVE mg/dL Final   Hgb urine dipstick 01/15/2022 NEGATIVE  NEGATIVE Final   Bilirubin Urine 01/15/2022 NEGATIVE  NEGATIVE Final   Ketones, ur 01/15/2022 80 (A)  NEGATIVE mg/dL Final   Protein, ur 01/15/2022 30 (A)  NEGATIVE mg/dL Final   Nitrite 01/15/2022 NEGATIVE  NEGATIVE Final   Leukocytes,Ua 01/15/2022 NEGATIVE  NEGATIVE Final   RBC / HPF 01/15/2022 0-5  0 - 5 RBC/hpf Final   WBC, UA 01/15/2022 11-20  0 - 5 WBC/hpf Final    Bacteria, UA 01/15/2022 FEW (A)  NONE SEEN Final   Squamous Epithelial / LPF 01/15/2022 11-20  0 - 5 Final   Mucus 01/15/2022 PRESENT   Final   Hyaline Casts, UA 01/15/2022 PRESENT   Final   Performed at [facility], [place] [address] [place], [place] [address]   Preg Test, Ur 01/15/2022 NEGATIVE  NEGATIVE Final   Comment:        THE SENSITIVITY OF THIS METHODOLOGY IS >20 mIU/mL. Performed at [facility], [place],  [place] [address]    POC Amphetamine UR 01/15/2022 None Detected  NONE DETECTED (Cut Off Level 1000 ng/mL) Final   POC Secobarbital (BAR) 01/15/2022 None Detected  NONE DETECTED (Cut Off Level 300 ng/mL) Final   POC Buprenorphine (BUP) 01/15/2022 None Detected  NONE DETECTED (Cut Off Level 10 ng/mL) Final   POC Oxazepam (BZO) 01/15/2022 Positive (A)  NONE DETECTED (Cut Off Level 300 ng/mL) Final   POC Cocaine UR 01/15/2022 None Detected  NONE DETECTED (Cut Off Level 300 ng/mL) Final   POC Methamphetamine UR 01/15/2022 None Detected  NONE DETECTED (Cut Off Level 1000 ng/mL) Final   POC Morphine 01/15/2022 None Detected  NONE DETECTED (Cut Off Level 300 ng/mL) Final   POC Oxycodone UR 01/15/2022 None Detected  NONE DETECTED (Cut Off Level 100 ng/mL) Final   POC Methadone UR 01/15/2022 None Detected  NONE DETECTED (Cut Off Level 300 ng/mL) Final   POC Marijuana UR 01/15/2022 Positive (A)  NONE DETECTED (Cut Off Level 50 ng/mL) Final   SARSCOV2ONAVIRUS 2 AG 01/15/2022 NEGATIVE  NEGATIVE Final   Comment: (NOTE) SARS-CoV-2 antigen NOT DETECTED.   Negative results are presumptive.  Negative results do not preclude SARS-CoV-2 infection and should not be used as the sole basis for treatment or other patient management decisions, including infection  control decisions, particularly in the presence of clinical signs and  symptoms consistent with COVID-19, or in those who have been in contact with the virus.  Negative results must be combined with clinical  observations, patient history, and epidemiological information. The expected result is Negative.  Fact Sheet for Patients: HandmadeRecipes.com.cy  Fact Sheet for Healthcare Providers: FuneralLife.at  This test is not yet approved or cleared by the [place] FDA and  has been authorized for detection and/or diagnosis of SARS-CoV-2 by FDA under an Emergency Use Authorization (EUA).  This EUA will remain in effect (meaning this test can be used) for the duration of  the COV                          ID-19 declaration under Section 564(b)(1) of the Act, 21 U.S.C. section 360bbb-3(b)(1), unless the authorization is terminated or revoked sooner.     Preg Test, Ur 01/15/2022 NEGATIVE  NEGATIVE Final   Comment:        THE SENSITIVITY OF THIS METHODOLOGY IS >24 mIU/mL   Admission on 01/08/2022, Discharged on 01/09/2022  Component Date Value Ref Range Status   WBC 01/08/2022 9.2  4.0 - 10.5 K/uL Final   RBC 01/08/2022 4.65  3.87 - 5.11 MIL/uL Final   Hemoglobin 01/08/2022 15.3 (H)  12.0 - 15.0 g/dL Final   HCT 01/08/2022 43.5  36.0 - 46.0 % Final   MCV 01/08/2022 93.5  80.0 - 100.0 fL Final   MCH 01/08/2022 32.9  26.0 - 34.0 pg Final   MCHC 01/08/2022 35.2  30.0 - 36.0 g/dL Final   RDW 01/08/2022 12.5  11.5 - 15.5 % Final   Platelets 01/08/2022 352  150 - 400 K/uL Final   nRBC 01/08/2022 0.0  0.0 - 0.2 % Final   Neutrophils Relative % 01/08/2022 69  % Final   Neutro Abs 01/08/2022 6.4  1.7 - 7.7 K/uL Final   Lymphocytes Relative 01/08/2022 24  % Final   Lymphs Abs 01/08/2022 2.2  0.7 - 4.0 K/uL Final   Monocytes Relative 01/08/2022 5  % Final   Monocytes Absolute 01/08/2022 0.5  0.1 - 1.0 K/uL Final  Eosinophils Relative 01/08/2022 1  % Final   Eosinophils Absolute 01/08/2022 0.1  0.0 - 0.5 K/uL Final   Basophils Relative 01/08/2022 1  % Final   Basophils Absolute 01/08/2022 0.1  0.0 - 0.1 K/uL Final   Immature Granulocytes  01/08/2022 0  % Final   Abs Immature Granulocytes 01/08/2022 0.04  0.00 - 0.07 K/uL Final   Performed at [facility], [place] [address] [place], [place] [address]   Sodium 01/08/2022 135  135 - 145 mmol/L Final   Potassium 01/08/2022 4.2  3.5 - 5.1 mmol/L Final   Chloride 01/08/2022 102  98 - 111 mmol/L Final   CO2 01/08/2022 21 (L)  22 - 32 mmol/L Final   Glucose, Bld 01/08/2022 145 (H)  70 - 99 mg/dL Final   Glucose reference range applies only to samples taken after fasting for at least 8 hours.   BUN 01/08/2022 8  6 - 20 mg/dL Final   Creatinine, Ser 01/08/2022 0.80  0.44 - 1.00 mg/dL Final   Calcium 01/08/2022 9.6  8.9 - 10.3 mg/dL Final   Total Protein 01/08/2022 7.9  6.5 - 8.1 g/dL Final   Albumin 01/08/2022 4.7  3.5 - 5.0 g/dL Final   AST 01/08/2022 40  15 - 41 U/L Final   ALT 01/08/2022 48 (H)  0 - 44 U/L Final   Alkaline Phosphatase 01/08/2022 51  38 - 126 U/L Final   Total Bilirubin 01/08/2022 0.7  0.3 - 1.2 mg/dL Final   GFR, Estimated 01/08/2022 >60  >60 mL/min Final   Comment: (NOTE) Calculated using the CKD-EPI Creatinine Equation (2021)    Anion gap 01/08/2022 12  5 - 15 Final   Performed at [facility] [address], [place], [place] [address]   Alcohol, Ethyl (B) 01/08/2022 <10  <10 mg/dL Final   Comment: (NOTE) Lowest detectable limit for serum alcohol is 10 mg/dL.  For medical purposes only. Performed at [facility], [place] [address], [place], [place] [address]    Acetaminophen (Tylenol), Serum 01/08/2022 <10 (L)  10 - 30 ug/mL Final   Comment: (NOTE) Therapeutic concentrations vary significantly. A range of 10-30 ug/mL  may be an effective concentration for many patients. However, some  are best treated at concentrations outside of this range. Acetaminophen concentrations >150 ug/mL at 4 hours after ingestion  and >50 ug/mL at 12 hours after ingestion are often associated with  toxic reactions.  Performed at [facility],  [place] [address] [place], [place] [id number]    Salicylate Lvl 123456 <7.0 (L)  7.0 - 30.0 mg/dL Final   Performed at [place] [address] [place], [place] [address]   I-stat hCG, quantitative 01/09/2022 <5.0  <5 mIU/mL Final   Comment 3 01/09/2022          Final   Comment:   GEST. AGE      CONC.  (mIU/mL)   <=1 WEEK        5 - 50     2 WEEKS       50 - 500     3 WEEKS       100 - 10,000     4 WEEKS     1,000 - 30,000        FEMALE AND NON-PREGNANT FEMALE:     LESS THAN 5 mIU/mL    SARS Coronavirus 2 by RT PCR 01/09/2022 NEGATIVE  NEGATIVE Final   Comment: (NOTE)  SARS-CoV-2 target nucleic acids are NOT DETECTED.  The SARS-CoV-2 RNA is generally detectable in upper respiratory specimens during the acute phase of infection. The lowest concentration of SARS-CoV-2 viral copies this assay can detect is 138 copies/mL. A negative result does not preclude SARS-Cov-2 infection and should not be used as the sole basis for treatment or other patient management decisions. A negative result may occur with  improper specimen collection/handling, submission of specimen other than nasopharyngeal swab, presence of viral mutation(s) within the areas targeted by this assay, and inadequate number of viral copies(<138 copies/mL). A negative result must be combined with clinical observations, patient history, and epidemiological information. The expected result is Negative.  Fact Sheet for Patients:  EntrepreneurPulse.com.au  Fact Sheet for Healthcare Providers:  IncredibleEmployment.be  This test is no                          t yet approved or cleared by the [place] FDA and  has been authorized for detection and/or diagnosis of SARS-CoV-2 by FDA under an Emergency Use Authorization (EUA). This EUA will remain  in effect (meaning this test can be used) for the duration of the COVID-19 declaration under Section 564(b)(1) of the Act,  21 U.S.C.section 360bbb-3(b)(1), unless the authorization is terminated  or revoked sooner.       Influenza A by PCR 01/09/2022 NEGATIVE  NEGATIVE Final   Influenza B by PCR 01/09/2022 NEGATIVE  NEGATIVE Final   Comment: (NOTE) The Xpert Xpress SARS-CoV-2/FLU/RSV plus assay is intended as an aid in the diagnosis of influenza from Nasopharyngeal swab specimens and should not be used as a sole basis for treatment. Nasal washings and aspirates are unacceptable for Xpert Xpress SARS-CoV-2/FLU/RSV testing.  Fact Sheet for Patients: EntrepreneurPulse.com.au  Fact Sheet for Healthcare Providers: IncredibleEmployment.be  This test is not yet approved or cleared by the [place] FDA and has been authorized for detection and/or diagnosis of SARS-CoV-2 by FDA under an Emergency Use Authorization (EUA). This EUA will remain in effect (meaning this test can be used) for the duration of the COVID-19 declaration under Section 564(b)(1) of the Act, 21 U.S.C. section 360bbb-3(b)(1), unless the authorization is terminated or revoked.  Performed at [facility], [place] [address], [place], [place] [address]   Admission on 10/15/2021, Discharged on 10/17/2021  Component Date Value Ref Range Status   Sodium 10/16/2021 135  135 - 145 mmol/L Final   Potassium 10/16/2021 3.7  3.5 - 5.1 mmol/L Final   Chloride 10/16/2021 102  98 - 111 mmol/L Final   CO2 10/16/2021 23  22 - 32 mmol/L Final   Glucose, Bld 10/16/2021 78  70 - 99 mg/dL Final   Glucose reference range applies only to samples taken after fasting for at least 8 hours.   BUN 10/16/2021 8  6 - 20 mg/dL Final   Creatinine, Ser 10/16/2021 0.70  0.44 - 1.00 mg/dL Final   Calcium 10/16/2021 9.1  8.9 - 10.3 mg/dL Final   Total Protein 10/16/2021 7.0  6.5 - 8.1 g/dL Final   Albumin 10/16/2021 4.2  3.5 - 5.0 g/dL Final   AST 10/16/2021 43 (H)  15 - 41 U/L Final   ALT 10/16/2021 40  0 - 44 U/L Final    Alkaline Phosphatase 10/16/2021 44  38 - 126 U/L Final   Total Bilirubin 10/16/2021 1.4 (H)  0.3 - 1.2 mg/dL Final   GFR, Estimated 10/16/2021 >60  >60 mL/min Final  Comment: (NOTE) Calculated using the CKD-EPI Creatinine Equation (2021)    Anion gap 10/16/2021 10  5 - 15 Final   Performed at [facility], [place] [address] [place] [place], [place] [address]   WBC 10/16/2021 7.0  4.0 - 10.5 K/uL Final   RBC 10/16/2021 4.24  3.87 - 5.11 MIL/uL Final   Hemoglobin 10/16/2021 13.9  12.0 - 15.0 g/dL Final   HCT 10/16/2021 39.5  36.0 - 46.0 % Final   MCV 10/16/2021 93.2  80.0 - 100.0 fL Final   MCH 10/16/2021 32.8  26.0 - 34.0 pg Final   MCHC 10/16/2021 35.2  30.0 - 36.0 g/dL Final   RDW 10/16/2021 12.7  11.5 - 15.5 % Final   Platelets 10/16/2021 319  150 - 400 K/uL Final   nRBC 10/16/2021 0.0  0.0 - 0.2 % Final   Neutrophils Relative % 10/16/2021 59  % Final   Neutro Abs 10/16/2021 4.1  1.7 - 7.7 K/uL Final   Lymphocytes Relative 10/16/2021 29  % Final   Lymphs Abs 10/16/2021 2.0  0.7 - 4.0 K/uL Final   Monocytes Relative 10/16/2021 6  % Final   Monocytes Absolute 10/16/2021 0.4  0.1 - 1.0 K/uL Final   Eosinophils Relative 10/16/2021 5  % Final   Eosinophils Absolute 10/16/2021 0.3  0.0 - 0.5 K/uL Final   Basophils Relative 10/16/2021 1  % Final   Basophils Absolute 10/16/2021 0.1  0.0 - 0.1 K/uL Final   Immature Granulocytes 10/16/2021 0  % Final   Abs Immature Granulocytes 10/16/2021 0.03  0.00 - 0.07 K/uL Final   Performed at [facility], [place] [address], [place], [place] [address]   Alcohol, Ethyl (B) 10/16/2021 <10  <10 mg/dL Final   Comment: (NOTE) Lowest detectable limit for serum alcohol is 10 mg/dL.  For medical purposes only. Performed at [facility], [place] [address] [place], [place] [address]    I-stat hCG, quantitative 10/16/2021 <5.0  <5 mIU/mL Final   Comment 3 10/16/2021          Final   Comment:   GEST. AGE      CONC.  (mIU/mL)   <=1 WEEK        5 - 50      2 WEEKS       50 - 500     3 WEEKS       100 - 10,000     4 WEEKS     1,000 - 30,000        FEMALE AND NON-PREGNANT FEMALE:     LESS THAN 5 mIU/mL    Lactic Acid, Venous 10/16/2021 0.7  0.5 - 1.9 mmol/L Final   Performed at [facility], [place] [address], [place], [place] [address]   SARS Coronavirus 2 by RT PCR 10/16/2021 NEGATIVE  NEGATIVE Final   Comment: (NOTE) SARS-CoV-2 target nucleic acids are NOT DETECTED.  The SARS-CoV-2 RNA is generally detectable in upper respiratory specimens during the acute phase of infection. The lowest concentration of SARS-CoV-2 viral copies this assay can detect is 138 copies/mL. A negative result does not preclude SARS-Cov-2 infection and should not be used as the sole basis for treatment or other patient management decisions. A negative result may occur with  improper specimen collection/handling, submission of specimen other than nasopharyngeal swab, presence of viral mutation(s) within the areas targeted by this assay, and inadequate number of viral copies(<138 copies/mL). A negative result must be combined with  clinical observations, patient history, and epidemiological information. The expected result is Negative.  Fact Sheet for Patients:  EntrepreneurPulse.com.au  Fact Sheet for Healthcare Providers:  IncredibleEmployment.be  This test is no                          t yet approved or cleared by the [place] FDA and  has been authorized for detection and/or diagnosis of SARS-CoV-2 by FDA under an Emergency Use Authorization (EUA). This EUA will remain  in effect (meaning this test can be used) for the duration of the COVID-19 declaration under Section 564(b)(1) of the Act, 21 U.S.C.section 360bbb-3(b)(1), unless the authorization is terminated  or revoked sooner.       Influenza A by PCR 10/16/2021 NEGATIVE  NEGATIVE Final   Influenza B by PCR 10/16/2021 NEGATIVE  NEGATIVE Final    Comment: (NOTE) The Xpert Xpress SARS-CoV-2/FLU/RSV plus assay is intended as an aid in the diagnosis of influenza from Nasopharyngeal swab specimens and should not be used as a sole basis for treatment. Nasal washings and aspirates are unacceptable for Xpert Xpress SARS-CoV-2/FLU/RSV testing.  Fact Sheet for Patients: EntrepreneurPulse.com.au  Fact Sheet for Healthcare Providers: IncredibleEmployment.be  This test is not yet approved or cleared by the [place] FDA and has been authorized for detection and/or diagnosis of SARS-CoV-2 by FDA under an Emergency Use Authorization (EUA). This EUA will remain in effect (meaning this test can be used) for the duration of the COVID-19 declaration under Section 564(b)(1) of the Act, 21 U.S.C. section 360bbb-3(b)(1), unless the authorization is terminated or revoked.  Performed at [facility], [place] [address], [place], [place] [address]   Admission on 10/09/2021, Discharged on 10/10/2021  Component Date Value Ref Range Status   SARS Coronavirus 2 by RT PCR 10/09/2021 NEGATIVE  NEGATIVE Final   Comment: (NOTE) SARS-CoV-2 target nucleic acids are NOT DETECTED.  The SARS-CoV-2 RNA is generally detectable in upper respiratory specimens during the acute phase of infection. The lowest concentration of SARS-CoV-2 viral copies this assay can detect is 138 copies/mL. A negative result does not preclude SARS-Cov-2 infection and should not be used as the sole basis for treatment or other patient management decisions. A negative result may occur with  improper specimen collection/handling, submission of specimen other than nasopharyngeal swab, presence of viral mutation(s) within the areas targeted by this assay, and inadequate number of viral copies(<138 copies/mL). A negative result must be combined with clinical observations, patient history, and epidemiological information. The expected result is  Negative.  Fact Sheet for Patients:  EntrepreneurPulse.com.au  Fact Sheet for Healthcare Providers:  IncredibleEmployment.be  This test is no                          t yet approved or cleared by the [place] FDA and  has been authorized for detection and/or diagnosis of SARS-CoV-2 by FDA under an Emergency Use Authorization (EUA). This EUA will remain  in effect (meaning this test can be used) for the duration of the COVID-19 declaration under Section 564(b)(1) of the Act, 21 U.S.C.section 360bbb-3(b)(1), unless the authorization is terminated  or revoked sooner.       Influenza A by PCR 10/09/2021 NEGATIVE  NEGATIVE Final   Influenza B by PCR 10/09/2021 NEGATIVE  NEGATIVE Final   Comment: (NOTE) The Xpert Xpress SARS-CoV-2/FLU/RSV plus assay is intended as an aid in the diagnosis of influenza from  Nasopharyngeal swab specimens and should not be used as a sole basis for treatment. Nasal washings and aspirates are unacceptable for Xpert Xpress SARS-CoV-2/FLU/RSV testing.  Fact Sheet for Patients: EntrepreneurPulse.com.au  Fact Sheet for Healthcare Providers: IncredibleEmployment.be  This test is not yet approved or cleared by the [place] FDA and has been authorized for detection and/or diagnosis of SARS-CoV-2 by FDA under an Emergency Use Authorization (EUA). This EUA will remain in effect (meaning this test can be used) for the duration of the COVID-19 declaration under Section 564(b)(1) of the Act, 21 U.S.C. section 360bbb-3(b)(1), unless the authorization is terminated or revoked.  Performed at [facility], [place] [address], [place], [place] [address]    WBC 10/09/2021 9.0  4.0 - 10.5 K/uL Final   RBC 10/09/2021 4.54  3.87 - 5.11 MIL/uL Final   Hemoglobin 10/09/2021 14.8  12.0 - 15.0 g/dL Final   HCT 10/09/2021 42.4  36.0 - 46.0 % Final   MCV 10/09/2021 93.4  80.0 - 100.0 fL Final    MCH 10/09/2021 32.6  26.0 - 34.0 pg Final   MCHC 10/09/2021 34.9  30.0 - 36.0 g/dL Final   RDW 10/09/2021 12.1  11.5 - 15.5 % Final   Platelets 10/09/2021 328  150 - 400 K/uL Final   nRBC 10/09/2021 0.0  0.0 - 0.2 % Final   Neutrophils Relative % 10/09/2021 71  % Final   Neutro Abs 10/09/2021 6.4  1.7 - 7.7 K/uL Final   Lymphocytes Relative 10/09/2021 23  % Final   Lymphs Abs 10/09/2021 2.1  0.7 - 4.0 K/uL Final   Monocytes Relative 10/09/2021 4  % Final   Monocytes Absolute 10/09/2021 0.4  0.1 - 1.0 K/uL Final   Eosinophils Relative 10/09/2021 1  % Final   Eosinophils Absolute 10/09/2021 0.1  0.0 - 0.5 K/uL Final   Basophils Relative 10/09/2021 1  % Final   Basophils Absolute 10/09/2021 0.1  0.0 - 0.1 K/uL Final   Immature Granulocytes 10/09/2021 0  % Final   Abs Immature Granulocytes 10/09/2021 0.02  0.00 - 0.07 K/uL Final   Performed at [facility], [place] [address] [place], [place] [address]   Sodium 10/09/2021 136  135 - 145 mmol/L Final   Potassium 10/09/2021 3.7  3.5 - 5.1 mmol/L Final   Chloride 10/09/2021 99  98 - 111 mmol/L Final   CO2 10/09/2021 27  22 - 32 mmol/L Final   Glucose, Bld 10/09/2021 80  70 - 99 mg/dL Final   Glucose reference range applies only to samples taken after fasting for at least 8 hours.   BUN 10/09/2021 7  6 - 20 mg/dL Final   Creatinine, Ser 10/09/2021 0.64  0.44 - 1.00 mg/dL Final   Calcium 10/09/2021 10.1  8.9 - 10.3 mg/dL Final   Total Protein 10/09/2021 8.1  6.5 - 8.1 g/dL Final   Albumin 10/09/2021 4.7  3.5 - 5.0 g/dL Final   AST 10/09/2021 30  15 - 41 U/L Final   ALT 10/09/2021 44  0 - 44 U/L Final   Alkaline Phosphatase 10/09/2021 51  38 - 126 U/L Final   Total Bilirubin 10/09/2021 0.6  0.3 - 1.2 mg/dL Final   GFR, Estimated 10/09/2021 >60  >60 mL/min Final   Comment: (NOTE) Calculated using the CKD-EPI Creatinine Equation (2021)    Anion gap 10/09/2021 10  5 - 15 Final   Performed at [place] [address],  [place], [place] [address]  Hgb A1c MFr Bld 10/09/2021 5.0  4.8 - 5.6 % Final   Comment: (NOTE) Pre diabetes:          5.7%-6.4%  Diabetes:              >6.4%  Glycemic control for   <7.0% adults with diabetes    Mean Plasma Glucose 10/09/2021 96.8  mg/dL Final   Performed at [facility], [place] [address], [place], [place] [address]   Alcohol, Ethyl (B) 10/09/2021 <10  <10 mg/dL Final   Comment: (NOTE) Lowest detectable limit for serum alcohol is 10 mg/dL.  For medical purposes only. Performed at [facility], [place] [address], [place], [place] [address]    Cholesterol 10/09/2021 198  0 - 200 mg/dL Final   Triglycerides 10/09/2021 63  <150 mg/dL Final   HDL 10/09/2021 54  >40 mg/dL Final   Total CHOL/HDL Ratio 10/09/2021 3.7  RATIO Final   VLDL 10/09/2021 13  0 - 40 mg/dL Final   LDL Cholesterol 10/09/2021 131 (H)  0 - 99 mg/dL Final   Comment:        Total Cholesterol/HDL:CHD Risk Coronary Heart Disease Risk Table                     Men   Women  1/2 Average Risk   3.4   3.3  Average Risk       5.0   4.4  2 X Average Risk   9.6   7.1  3 X Average Risk  23.4   11.0        Use the calculated Patient Ratio above and the CHD Risk Table to determine the patient's CHD Risk.        ATP III CLASSIFICATION (LDL):  <100     mg/dL   Optimal  100-129  mg/dL   Near or Above                    Optimal  130-159  mg/dL   Borderline  160-189  mg/dL   High  >190     mg/dL   Very High Performed at [place] [address], [place], [place] [address]    TSH 10/09/2021 1.972  0.350 - 4.500 uIU/mL Final   Comment: Performed by a 3rd Generation assay with a functional sensitivity of <=0.01 uIU/mL. Performed at [facility], [place] [address], [place], [place] [address]    POC Amphetamine UR 10/09/2021 None Detected  NONE DETECTED (Cut Off Level 1000 ng/mL) Final   POC Secobarbital (BAR) 10/09/2021 None Detected  NONE DETECTED (Cut Off Level 300 ng/mL) Final   POC  Buprenorphine (BUP) 10/09/2021 None Detected  NONE DETECTED (Cut Off Level 10 ng/mL) Final   POC Oxazepam (BZO) 10/09/2021 None Detected  NONE DETECTED (Cut Off Level 300 ng/mL) Final   POC Cocaine UR 10/09/2021 None Detected  NONE DETECTED (Cut Off Level 300 ng/mL) Final   POC Methamphetamine UR 10/09/2021 None Detected  NONE DETECTED (Cut Off Level 1000 ng/mL) Final   POC Morphine 10/09/2021 None Detected  NONE DETECTED (Cut Off Level 300 ng/mL) Final   POC Oxycodone UR 10/09/2021 None Detected  NONE DETECTED (Cut Off Level 100 ng/mL) Final   POC Methadone UR 10/09/2021 None Detected  NONE DETECTED (Cut Off Level 300 ng/mL) Final   POC Marijuana UR 10/09/2021 Positive (A)  NONE DETECTED (Cut Off Level 50 ng/mL) Final   SARS Coronavirus  2 Ag 10/09/2021 Negative  Negative Preliminary   Preg Test, Ur 10/09/2021 NEGATIVE  NEGATIVE Final   Comment:        THE SENSITIVITY OF THIS METHODOLOGY IS >24 mIU/mL    SARSCOV2ONAVIRUS 2 AG 10/09/2021 NEGATIVE  NEGATIVE Final   Comment: (NOTE) SARS-CoV-2 antigen NOT DETECTED.   Negative results are presumptive.  Negative results do not preclude SARS-CoV-2 infection and should not be used as the sole basis for treatment or other patient management decisions, including infection  control decisions, particularly in the presence of clinical signs and  symptoms consistent with COVID-19, or in those who have been in contact with the virus.  Negative results must be combined with clinical observations, patient history, and epidemiological information. The expected result is Negative.  Fact Sheet for Patients: HandmadeRecipes.com.cy  Fact Sheet for Healthcare Providers: FuneralLife.at  This test is not yet approved or cleared by the [place] FDA and  has been authorized for detection and/or diagnosis of SARS-CoV-2 by FDA under an Emergency Use Authorization (EUA).  This EUA will remain in effect (meaning  this test can be used) for the duration of  the COV                          ID-19 declaration under Section 564(b)(1) of the Act, 21 U.S.C. section 360bbb-3(b)(1), unless the authorization is terminated or revoked sooner.    Admission on 10/08/2021, Discharged on 10/09/2021  Component Date Value Ref Range Status   Sodium 10/08/2021 136  135 - 145 mmol/L Final   Potassium 10/08/2021 3.9  3.5 - 5.1 mmol/L Final   Chloride 10/08/2021 103  98 - 111 mmol/L Final   CO2 10/08/2021 23  22 - 32 mmol/L Final   Glucose, Bld 10/08/2021 102 (H)  70 - 99 mg/dL Final   Glucose reference range applies only to samples taken after fasting for at least 8 hours.   BUN 10/08/2021 11  6 - 20 mg/dL Final   Creatinine, Ser 10/08/2021 0.68  0.44 - 1.00 mg/dL Final   Calcium 10/08/2021 9.6  8.9 - 10.3 mg/dL Final   Total Protein 10/08/2021 8.2 (H)  6.5 - 8.1 g/dL Final   Albumin 10/08/2021 4.8  3.5 - 5.0 g/dL Final   AST 10/08/2021 33  15 - 41 U/L Final   ALT 10/08/2021 52 (H)  0 - 44 U/L Final   Alkaline Phosphatase 10/08/2021 55  38 - 126 U/L Final   Total Bilirubin 10/08/2021 0.8  0.3 - 1.2 mg/dL Final   GFR, Estimated 10/08/2021 >60  >60 mL/min Final   Comment: (NOTE) Calculated using the CKD-EPI Creatinine Equation (2021)    Anion gap 10/08/2021 10  5 - 15 Final   Performed at [facility], [address] [place], [place] [address]   Acetaminophen (Tylenol), Serum 10/08/2021 <10 (L)  10 - 30 ug/mL Final   Comment: (NOTE) Therapeutic concentrations vary significantly. A range of 10-30 ug/mL  may be an effective concentration for many patients. However, some  are best treated at concentrations outside of this range. Acetaminophen concentrations >150 ug/mL at 4 hours after ingestion  and >50 ug/mL at 12 hours after ingestion are often associated with  toxic reactions.  Performed at [facility], [address], [place], [place] [address]    Alcohol, Ethyl (B) 10/08/2021 <10  <10 mg/dL Final    Comment: (NOTE) Lowest detectable limit for serum alcohol is 10 mg/dL.  For medical purposes  only. Performed at [facility], [address] [place], [place] [id number]    Salicylate Lvl [id number] <7.0 (L)  7.0 - 30.0 mg/dL Final   Performed at [facility], [address], [place], [place] [address]   WBC 10/08/2021 8.5  4.0 - 10.5 K/uL Final   RBC 10/08/2021 4.35  3.87 - 5.11 MIL/uL Final   Hemoglobin 10/08/2021 14.3  12.0 - 15.0 g/dL Final   HCT 10/08/2021 41.0  36.0 - 46.0 % Final   MCV 10/08/2021 94.3  80.0 - 100.0 fL Final   MCH 10/08/2021 32.9  26.0 - 34.0 pg Final   MCHC 10/08/2021 34.9  30.0 - 36.0 g/dL Final   RDW 10/08/2021 12.2  11.5 - 15.5 % Final   Platelets 10/08/2021 312  150 - 400 K/uL Final   nRBC 10/08/2021 0.0  0.0 - 0.2 % Final   Neutrophils Relative % 10/08/2021 72  % Final   Neutro Abs 10/08/2021 6.1  1.7 - 7.7 K/uL Final   Lymphocytes Relative 10/08/2021 22  % Final   Lymphs Abs 10/08/2021 1.8  0.7 - 4.0 K/uL Final   Monocytes Relative 10/08/2021 4  % Final   Monocytes Absolute 10/08/2021 0.4  0.1 - 1.0 K/uL Final   Eosinophils Relative 10/08/2021 1  % Final   Eosinophils Absolute 10/08/2021 0.1  0.0 - 0.5 K/uL Final   Basophils Relative 10/08/2021 1  % Final   Basophils Absolute 10/08/2021 0.0  0.0 - 0.1 K/uL Final   Immature Granulocytes 10/08/2021 0  % Final   Abs Immature Granulocytes 10/08/2021 0.03  0.00 - 0.07 K/uL Final   Performed at [facility], [address], [place], [place] [address]   I-stat hCG, quantitative 10/08/2021 <5.0  <5 mIU/mL Final   Comment 3 10/08/2021          Final   Comment:   GEST. AGE      CONC.  (mIU/mL)   <=1 WEEK        5 - 50     2 WEEKS       50 - 500     3 WEEKS       100 - 10,000     4 WEEKS     1,000 - 30,000        FEMALE AND NON-PREGNANT FEMALE:     LESS THAN 5 mIU/mL    Opiates 10/08/2021 NONE DETECTED  NONE DETECTED Final   Cocaine 10/08/2021 NONE DETECTED  NONE DETECTED Final   Benzodiazepines 10/08/2021  NONE DETECTED  NONE DETECTED Final   Amphetamines 10/08/2021 NONE DETECTED  NONE DETECTED Final   Tetrahydrocannabinol 10/08/2021 POSITIVE (A)  NONE DETECTED Final   Barbiturates 10/08/2021 NONE DETECTED  NONE DETECTED Final   Comment: (NOTE) DRUG SCREEN FOR MEDICAL PURPOSES ONLY.  IF CONFIRMATION IS NEEDED FOR ANY PURPOSE, NOTIFY LAB WITHIN 5 DAYS.  LOWEST DETECTABLE LIMITS FOR URINE DRUG SCREEN Drug Class                     Cutoff (ng/mL) Amphetamine and metabolites    1000 Barbiturate and metabolites    200 Benzodiazepine                 [id number] Tricyclics and metabolites     300 Opiates and metabolites        300 Cocaine and metabolites        300 THC  36 Performed at [facility], [address], [place], [place] [address]    Color, Urine 10/08/2021 YELLOW  YELLOW Final   APPearance 10/08/2021 HAZY (A)  CLEAR Final   Specific Gravity, Urine 10/08/2021 1.025  1.005 - 1.030 Final   pH 10/08/2021 6.0  5.0 - 8.0 Final   Glucose, UA 10/08/2021 NEGATIVE  NEGATIVE mg/dL Final   Hgb urine dipstick 10/08/2021 NEGATIVE  NEGATIVE Final   Bilirubin Urine 10/08/2021 NEGATIVE  NEGATIVE Final   Ketones, ur 10/08/2021 NEGATIVE  NEGATIVE mg/dL Final   Protein, ur 10/08/2021 NEGATIVE  NEGATIVE mg/dL Final   Nitrite 10/08/2021 NEGATIVE  NEGATIVE Final   Leukocytes,Ua 10/08/2021 SMALL (A)  NEGATIVE Final   Performed at [facility], [address] [place], [place] [address]   RBC / HPF 10/08/2021 0-5  0 - 5 RBC/hpf Final   WBC, UA 10/08/2021 6-10  0 - 5 WBC/hpf Final   Bacteria, UA 10/08/2021 MANY (A)  NONE SEEN Final   Squamous Epithelial / LPF 10/08/2021 21-50  0 - 5 Final   Trichomonas, UA 10/08/2021 PRESENT (A)  NONE SEEN Final   Ca Oxalate Crys, UA 10/08/2021 PRESENT   Final   Performed at [facility], [address] [place], [place] [address]   RPR Ser Ql 10/08/2021 NON REACTIVE  NON REACTIVE Final   Performed at [facility], [place] [address]  [place], [place] [address]   HIV Screen 4th Generation wRfx 10/08/2021 Non Reactive  Non Reactive Final   Performed at [facility], [place] [address], [place],  [address]    Blood Alcohol level:  Lab Results  Component Value Date   ETH <10 01/15/2022   ETH <10 123456    Metabolic Disorder Labs: Lab Results  Component Value Date   HGBA1C 5.0 10/09/2021   MPG 96.8 10/09/2021   No results found for: PROLACTIN Lab Results  Component Value Date   CHOL 171 01/15/2022   TRIG 10 01/15/2022   HDL 36 (L) 01/15/2022   CHOLHDL 4.8 01/15/2022   VLDL 2 01/15/2022   LDLCALC 133 (H) 01/15/2022   LDLCALC 131 (H) 10/09/2021    Therapeutic Lab Levels: No results found for: LITHIUM No results found for: VALPROATE No components found for:  CBMZ  Physical Findings   Flowsheet Row ED from 01/15/2022 in [facility] ED from 01/08/2022 in [place] ED from 10/17/2021 in [place] DEPT  C-SSRS RISK CATEGORY Moderate Risk Moderate Risk High Risk        Musculoskeletal  Strength & Muscle Tone: within normal limits Gait & Station: normal Patient leans: N/A  Psychiatric Specialty Exam  Presentation  General Appearance: Disheveled  Eye Contact:Minimal  Speech:Clear and Coherent  Speech Volume:Decreased  Handedness:Right   Mood and Affect  Mood:Irritable; Labile  Affect:Labile   Thought Process  Thought Processes:Disorganized  Descriptions of Associations:Intact  Orientation:Full (Time, Place and Person)  Thought Content:Logical; Paranoid Ideation  Diagnosis of Schizophrenia or Schizoaffective disorder in past: No  Duration of Psychotic Symptoms: Less than six months   Hallucinations:Hallucinations: None  Ideas of Reference:Paranoia  Suicidal Thoughts:Suicidal Thoughts: No  Homicidal Thoughts:Homicidal Thoughts: No   Sensorium  Memory:Immediate Fair; Remote  Poor; Recent Poor  Judgment:Poor  Insight:Lacking   Executive Functions  Concentration:Poor  Attention Span:Poor  Recall:Poor  Fund of Knowledge:Poor  Language:Fair   Psychomotor Activity  Psychomotor Activity:Psychomotor Activity: Increased   Assets  Assets:Communication Skills; Social Support; Housing  Sleep  Sleep:Sleep: Fair   No data recorded  Physical Exam  Physical Exam Constitutional:      Appearance: Normal appearance.  HENT:     Head: Normocephalic.     Nose: Nose normal.  Eyes:     Conjunctiva/sclera: Conjunctivae normal.  Cardiovascular:     Rate and Rhythm: Normal rate.  Pulmonary:     Effort: Pulmonary effort is normal.  Musculoskeletal:        General: Normal range of motion.     Cervical back: Normal range of motion.  Neurological:     Mental Status: She is alert and oriented to person, place, and time.   Review of Systems  Constitutional: Negative.   HENT: Negative.    Eyes: Negative.   Respiratory: Negative.    Cardiovascular: Negative.   Gastrointestinal: Negative.   Genitourinary: Negative.   Musculoskeletal: Negative.   Skin: Negative.   Neurological: Negative.   Endo/Heme/Allergies: Negative.   Blood pressure 135/88, pulse 98, temperature 98.3 F (36.8 C), temperature source Oral, resp. rate 18, SpO2 99 %. There is no height or weight on file to calculate BMI.  Treatment Plan Summary: Patient is under IVC. Patient is recommended for inpatient psychiatric treatment. No beds at [facility]. Patient faxed out for placement by CSW. Petition reads: "According to friend, respondent has taken some sort of narcotic substance that has affected her ability to take care of herself.  Friend states that what ever she has done/taken has "erased her mind", she is talking to herself, she is paranoid, she has not been eating or drinking regularly for days.  She has been seen at [facility] and had a seizure while there but was released.  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  -Change Zyprexa zydis 5 mg po BID to Zyprexa Zydis 10 mg po QHS and Zyprexa 5 mg po daily to start on 01/18/22.  Labs: Repeat EKG on 01/18/22.  Marissa Calamity, NP 01/17/2022 12:54 PM

## 2022-01-17 NOTE — Progress Notes (Signed)
Per Ardell Isaacs, patient meets criteria for inpatient treatment. There are no available or appropriate beds at [facility] today. CSW re-faxed referrals to the following facilities for review: ? ?[facility]  Pending - Request Sent N/A [address], [place] [address] [phone] --  ?[facility]  Pending - Request Sent N/A [address] [place] [address] [phone] --  ?[facility]  Pending - Request Sent N/A [address] [place] [address] [phone] --  ?[facility]  Pending - Request Sent N/A [address] [place] [address] [phone] --  ?[facility]  Pending - Request Sent N/A [address] [place] [address] [phone] --  ?[facility]  Pending - Request Sent N/A [address] [place], [place] [address] [phone] --  ?[facility]  Pending - Request Sent N/A [address] [place] [address] [phone] --  ?[facility]  Pending - Request Sent N/A [address] [place] [address] [phone] --  ?[facility]  Pending - Request Sent N/A [address] [place] [address] [phone] --  ?[facility]  Pending - Request Sent N/A [address] [place] [address] [phone] --  ?[facility]  Pending - Request Sent N/A [address], [place] [address] [phone] --  ?[facility]  Pending - Request Sent N/A [address] [place] [address] [phone] --  ?[facility]  Pending - Request Sent N/A [name] [place] [address] [phone] --  ?[facility]  Pending - Request  Sent N/A [address] [place] [address] [phone] --  ?[facility]  Pending - Request Sent N/A [address] [place], [place] [address] [phone] --  ?[facility]  Pending - Request Sent N/A [address] [place] [address] [phone] --  ? ? ?TTS will continue to seek bed placement. ? ?Crissie Reese, MSW, LCSW-A, LCAS-A ?Phone: [phone] ?Disposition/TOC ? ?

## 2022-01-17 NOTE — ED Notes (Signed)
Patient is resting quietly with eyes closed. No S/S/ of distress, respirations even and unlabored. Will contiune to monitor for safety. ?

## 2022-01-17 NOTE — ED Notes (Signed)
Pt awakens to voice.  Blunt , flat affect. Denies si, hi or avh when asked.  But this staff member is unable to verify pt fully comprehends questions.  She was offered breakfast but refused. She was encouraged to take a shower and she refused that also.   Staff will continue to monitor for safety.  ?

## 2022-01-17 NOTE — ED Notes (Signed)
Pt asleep in bed. Respirations even and unlabored. Will continue to monitor for safety. ?

## 2022-01-17 NOTE — ED Notes (Signed)
Patient is resting quietly at this time with eyes closed. No S/S of distress, respirations even and unlabored. Will continue to monitor for safety ?

## 2022-01-17 NOTE — ED Notes (Signed)
Patrice NP currently speaking with pt.   ?

## 2022-01-17 NOTE — ED Notes (Signed)
Pt sleeping quietly in view of nursing station.  Breathing even and unlabored.  Will continue to monitor for safety.  ?

## 2022-01-17 NOTE — ED Notes (Signed)
Took the pt for a EKG but it's not working ?

## 2022-01-17 NOTE — ED Notes (Signed)
Pt did eat lunch ?

## 2022-01-17 NOTE — ED Notes (Signed)
Barbara Gordon took her evening Zyprexa without any problem. She is calm and cooperative, but indifferent. Will continue to monitor for safety ?

## 2022-01-17 NOTE — ED Notes (Signed)
Patient got out of bed to get some water and said "I think all of this is stupid" , when asked for clarification, she repeated herself and said " I think this is all stupid. "  Will continue to monitor for safety  ?

## 2022-01-17 NOTE — ED Notes (Signed)
Pt awakens to voice.  Blunt , flat affect. Denies si, hi or avh when asked.  But this staff member is unable to verify pt fully comprehends questions.  She was offered breakfast and accepted a muffin and juice. She was encouraged to take a shower and she refused..  Pt was instructed to give UA when she needed to go.   Staff will continue to monitor for safety.  ?

## 2022-01-18 ENCOUNTER — Inpatient Hospital Stay (HOSPITAL_COMMUNITY)
Admission: AD | Admit: 2022-01-18 | Discharge: 2022-01-30 | DRG: 885 | Disposition: A | Discharge status: Home / Self Care | Payer: Federal, State, Local not specified - Other | Source: Intra-hospital | Attending: Psychiatry | Admitting: Psychiatry

## 2022-01-18 ENCOUNTER — Encounter (HOSPITAL_COMMUNITY): Payer: Self-pay | Admitting: Psychiatry

## 2022-01-18 ENCOUNTER — Other Ambulatory Visit: Payer: Self-pay

## 2022-01-18 DIAGNOSIS — Z20822 Contact with and (suspected) exposure to covid-19: Secondary | ICD-10-CM | POA: Diagnosis present

## 2022-01-18 DIAGNOSIS — F603 Borderline personality disorder: Secondary | ICD-10-CM | POA: Diagnosis present

## 2022-01-18 DIAGNOSIS — F29 Unspecified psychosis not due to a substance or known physiological condition: Secondary | ICD-10-CM | POA: Diagnosis present

## 2022-01-18 DIAGNOSIS — F12259 Cannabis dependence with psychotic disorder, unspecified: Secondary | ICD-10-CM | POA: Diagnosis present

## 2022-01-18 DIAGNOSIS — Z9151 Personal history of suicidal behavior: Secondary | ICD-10-CM

## 2022-01-18 DIAGNOSIS — G4710 Hypersomnia, unspecified: Secondary | ICD-10-CM | POA: Diagnosis present

## 2022-01-18 DIAGNOSIS — Z87820 Personal history of traumatic brain injury: Secondary | ICD-10-CM

## 2022-01-18 DIAGNOSIS — F419 Anxiety disorder, unspecified: Secondary | ICD-10-CM | POA: Diagnosis present

## 2022-01-18 DIAGNOSIS — Z818 Family history of other mental and behavioral disorders: Secondary | ICD-10-CM | POA: Diagnosis not present

## 2022-01-18 DIAGNOSIS — Z91040 Latex allergy status: Secondary | ICD-10-CM

## 2022-01-18 DIAGNOSIS — F23 Brief psychotic disorder: Secondary | ICD-10-CM | POA: Diagnosis present

## 2022-01-18 DIAGNOSIS — F259 Schizoaffective disorder, unspecified: Secondary | ICD-10-CM | POA: Diagnosis present

## 2022-01-18 DIAGNOSIS — Z638 Other specified problems related to primary support group: Secondary | ICD-10-CM | POA: Diagnosis not present

## 2022-01-18 DIAGNOSIS — F1210 Cannabis abuse, uncomplicated: Secondary | ICD-10-CM | POA: Diagnosis present

## 2022-01-18 DIAGNOSIS — R569 Unspecified convulsions: Secondary | ICD-10-CM | POA: Diagnosis present

## 2022-01-18 DIAGNOSIS — F319 Bipolar disorder, unspecified: Secondary | ICD-10-CM | POA: Diagnosis present

## 2022-01-18 DIAGNOSIS — Z885 Allergy status to narcotic agent status: Secondary | ICD-10-CM | POA: Diagnosis not present

## 2022-01-18 DIAGNOSIS — F17210 Nicotine dependence, cigarettes, uncomplicated: Secondary | ICD-10-CM | POA: Diagnosis present

## 2022-01-18 DIAGNOSIS — B1920 Unspecified viral hepatitis C without hepatic coma: Secondary | ICD-10-CM | POA: Diagnosis present

## 2022-01-18 DIAGNOSIS — Z5900 Homelessness unspecified: Secondary | ICD-10-CM | POA: Diagnosis not present

## 2022-01-18 DIAGNOSIS — E559 Vitamin D deficiency, unspecified: Secondary | ICD-10-CM | POA: Diagnosis present

## 2022-01-18 DIAGNOSIS — F12959 Cannabis use, unspecified with psychotic disorder, unspecified: Secondary | ICD-10-CM | POA: Diagnosis present

## 2022-01-18 DIAGNOSIS — Z87898 Personal history of other specified conditions: Principal | ICD-10-CM

## 2022-01-18 DIAGNOSIS — F1220 Cannabis dependence, uncomplicated: Secondary | ICD-10-CM | POA: Diagnosis present

## 2022-01-18 LAB — T4, FREE: Free T4: 0.98 ng/dL (ref 0.61–1.12)

## 2022-01-18 LAB — RESP PANEL BY RT-PCR (FLU A&B, COVID) ARPGX2
Influenza A by PCR: NEGATIVE
Influenza B by PCR: NEGATIVE
SARS Coronavirus 2 by RT PCR: NEGATIVE

## 2022-01-18 LAB — HEMOGLOBIN A1C
Hgb A1c MFr Bld: 5 % (ref 4.8–5.6)
Mean Plasma Glucose: 97 mg/dL

## 2022-01-18 MED ORDER — ENSURE ENLIVE PO LIQD
237.0000 mL | Freq: Two times a day (BID) | ORAL | Status: DC
  Administered 2022-01-19 – 2022-01-30 (×22): 237 mL via ORAL
  Filled 2022-01-18 (×27): qty 237

## 2022-01-18 MED ORDER — ZIPRASIDONE MESYLATE 20 MG IM SOLR
20.0000 mg | INTRAMUSCULAR | Status: DC | PRN
Start: 2022-01-18 — End: 2022-01-30

## 2022-01-18 MED ORDER — OLANZAPINE 10 MG PO TBDP
10.0000 mg | ORAL_TABLET | Freq: Every day | ORAL | Status: DC
  Administered 2022-01-19 – 2022-01-20 (×2): 10 mg via ORAL
  Filled 2022-01-18 (×5): qty 1

## 2022-01-18 MED ORDER — OLANZAPINE 10 MG PO TBDP
10.0000 mg | ORAL_TABLET | Freq: Once | ORAL | Status: AC
  Filled 2022-01-18: qty 1

## 2022-01-18 MED ORDER — NICOTINE POLACRILEX 2 MG MT GUM
2.0000 mg | CHEWING_GUM | OROMUCOSAL | Status: DC | PRN
  Administered 2022-01-19 – 2022-01-30 (×11): 2 mg via ORAL
  Filled 2022-01-18 (×8): qty 1

## 2022-01-18 MED ORDER — TRAZODONE HCL 50 MG PO TABS
50.0000 mg | ORAL_TABLET | Freq: Every evening | ORAL | Status: DC | PRN
  Administered 2022-01-28 – 2022-01-29 (×2): 50 mg via ORAL
  Filled 2022-01-18 (×3): qty 1

## 2022-01-18 MED ORDER — OLANZAPINE 5 MG PO TABS
5.0000 mg | ORAL_TABLET | Freq: Every day | ORAL | Status: DC
  Administered 2022-01-19: 5 mg via ORAL
  Filled 2022-01-18 (×3): qty 1

## 2022-01-18 MED ORDER — OLANZAPINE 10 MG PO TBDP
10.0000 mg | ORAL_TABLET | Freq: Three times a day (TID) | ORAL | Status: DC | PRN
  Administered 2022-01-26: 10 mg via ORAL

## 2022-01-18 MED ORDER — OLANZAPINE 5 MG PO TBDP
ORAL_TABLET | ORAL | Status: AC
  Administered 2022-01-18: 10 mg via ORAL
  Filled 2022-01-18: qty 2

## 2022-01-18 MED ORDER — LORAZEPAM 1 MG PO TABS
1.0000 mg | ORAL_TABLET | ORAL | Status: DC | PRN
Start: 2022-01-18 — End: 2022-01-30
  Filled 2022-01-18: qty 1

## 2022-01-18 MED ORDER — ACETAMINOPHEN 325 MG PO TABS: 650.0000 mg | ORAL_TABLET | Freq: Four times a day (QID) | ORAL | Status: DC | PRN

## 2022-01-18 MED ORDER — MAGNESIUM HYDROXIDE 400 MG/5ML PO SUSP: 30.0000 mL | Freq: Every day | ORAL | Status: DC | PRN

## 2022-01-18 MED ORDER — LORAZEPAM 2 MG/ML IJ SOLN: 1.0000 mg | Freq: Four times a day (QID) | INTRAMUSCULAR | Status: DC | PRN

## 2022-01-18 MED ORDER — OLANZAPINE 5 MG PO TABS: 5.0000 mg | ORAL_TABLET | Freq: Every day | ORAL | Status: DC

## 2022-01-18 MED ORDER — LORAZEPAM 1 MG PO TABS: 1.0000 mg | ORAL_TABLET | Freq: Four times a day (QID) | ORAL | Status: DC | PRN

## 2022-01-18 MED ORDER — LORAZEPAM 1 MG PO TABS
1.0000 mg | ORAL_TABLET | Freq: Once | ORAL | Status: AC
  Administered 2022-01-18: 1 mg via ORAL

## 2022-01-18 MED ORDER — HYDROXYZINE HCL 25 MG PO TABS
25.0000 mg | ORAL_TABLET | Freq: Three times a day (TID) | ORAL | Status: DC | PRN
  Administered 2022-01-19: 25 mg via ORAL
  Filled 2022-01-18: qty 1

## 2022-01-18 MED ORDER — OLANZAPINE 10 MG PO TBDP: 10.0000 mg | ORAL_TABLET | Freq: Every day | ORAL | Status: DC

## 2022-01-18 MED ORDER — ALUM & MAG HYDROXIDE-SIMETH 200-200-20 MG/5ML PO SUSP: 30.0000 mL | ORAL | Status: DC | PRN

## 2022-01-18 NOTE — Progress Notes (Signed)
Patient is a 31 year old female who presented under IVC from the [facility] for possible substance induced psychosis. Per report, IVC was initiated by a friend, who believed patient had taken some narcotic substance "that has affected her ability to care for herself." Per report, pt had not been eating, drinking, nor taking care of herself, and did not seem to be processing what was going on around her. Patient presented today as anxious,paranoid,minimally cooperative, and was a limited historian during her assessment interview. Pt appeared to be thought blocking at times, and appeared to be responding to internal stimuli. Pt denied A/VH, but was observed talking softly to herself during assessment. Pt denied SI/HI, and pain. VS monitored and recorded. Skin assessment revealed no abnormalities.Belongings searched and secured in locker. Some admission paperwork was completed and signed.. PO fluids provided. Q 15 min checks initiated for safety.  ?

## 2022-01-18 NOTE — ED Notes (Signed)
Pt resting quietly.  Breathing even and unlabored.   Staff will continue to monitor for safety.  

## 2022-01-18 NOTE — Progress Notes (Signed)
Psychoeducational Group Note ? ?Date:  01/18/2022 ?Time:  2127 ? ?Group Topic/Focus:  ?Wrap-Up Group:   The focus of this group is to help patients review their daily goal of treatment and discuss progress on daily workbooks. ? ?Participation Level: Did Not Attend ? ?Participation Quality:  Not Applicable ? ?Affect:  Not Applicable ? ?Cognitive:  Not Applicable ? ?Insight:  Not Applicable ? ?Engagement in Group: Not Applicable ? ?Additional Comments:  The patient did not attend grouip this evening.  ? ?Hazle Coca S ?01/18/2022, 9:27 PM  ?

## 2022-01-18 NOTE — ED Provider Notes (Cosign Needed Addendum)
FBC/OBS ASAP Discharge Summary  Date and Time: 01/18/2022 12:56 PM  Name: Barbara Gordon  MRN:  [id number]   Discharge Diagnoses:  Final diagnoses:  Brief psychotic disorder (HCC)    Subjective:  Barbara Gordon, 31 y.o., female patient presented to [facility] via [facility] under involuntary commitment.  She was admitted to the continuous assessment unit while awaiting inpatient psychiatric bed availability.  Patient seen face to face by this provider, consulted with Dr. Bronwen Betters; and chart reviewed on 01/18/22.  She has a history of polysubstance abuse, schizophrenia, bipolar, and TBI.   During evaluation Mannie Turton is laying in her bed.  She is alert/oriented to self, place, and city.  She is bizarre.  She is unsure of why she is in the "hospital".  She makes fleeting eye contact.  She is looking around the room and appears paranoid.  She is easily agitated.  She answers questions with a quick "no".  She is disorganized and has thought blocking at times.  She is inattentive and easily distracted.  When asking patient if she was SI/HI/AVH she states "no".  She appears to be responding to internal/external stimuli.  Stay Summary:   Patient continues to meet criteria for inpatient psychiatric admission.  [facility] notified and patient has been accepted.  She has been cooperative and redirectable while on the unit.  She has been compliant with medications and has tolerated without any adverse reactions.  Total Time spent with patient: 30 minutes  Past Psychiatric History: See H&P Past Medical History: No past medical history on file. No past surgical history on file. Family History: No family history on file. Family Psychiatric History: See H&P Social History:  Social History   Substance and Sexual Activity  Alcohol Use None     Social History   Substance and Sexual Activity  Drug Use Never    Social History   Socioeconomic History   Marital status: Single    Spouse name: Not on file    Number of children: Not on file   Years of education: Not on file   Highest education level: Not on file  Occupational History   Not on file  Tobacco Use   Smoking status: Some Days    Types: Cigarettes   Smokeless tobacco: Never  Substance and Sexual Activity   Alcohol use: Not on file   Drug use: Never   Sexual activity: Never  Other Topics Concern   Not on file  Social History Narrative   Not on file   Social Determinants of Health   Financial Resource Strain: Not on file  Food Insecurity: Not on file  Transportation Needs: Not on file  Physical Activity: Not on file  Stress: Not on file  Social Connections: Not on file   SDOH:  SDOH Screenings   Alcohol Screen: Not on file  Depression (PHQ2-9): Not on file  Financial Resource Strain: Not on file  Food Insecurity: Not on file  Housing: Not on file  Physical Activity: Not on file  Social Connections: Not on file  Stress: Not on file  Tobacco Use: High Risk   Smoking Tobacco Use: Some Days   Smokeless Tobacco Use: Never   Passive Exposure: Not on file  Transportation Needs: Not on file    Tobacco Cessation:  N/A, patient does not currently use tobacco products  Current Medications:  Current Facility-Administered Medications  Medication Dose Route Frequency Provider Last Rate Last Admin   acetaminophen (TYLENOL) tablet 650 mg  650 mg  Oral Q6H PRN Lenard Lance, FNP       alum & mag hydroxide-simeth (MAALOX/MYLANTA) 200-200-20 MG/5ML suspension 30 mL  30 mL Oral Q4H PRN Lenard Lance, FNP       hydrOXYzine (ATARAX) tablet 25 mg  25 mg Oral TID PRN Lenard Lance, FNP       LORazepam (ATIVAN) tablet 1 mg  1 mg Oral Q6H PRN White, Patrice L, NP   1 mg at 01/17/22 1636   Or   LORazepam (ATIVAN) injection 1 mg  1 mg Intramuscular Q6H PRN White, Patrice L, NP       magnesium hydroxide (MILK OF MAGNESIA) suspension 30 mL  30 mL Oral Daily PRN Lenard Lance, FNP       OLANZapine (ZYPREXA) tablet 5 mg  5 mg Oral Daily  White, Patrice L, NP   5 mg at 01/18/22 0945   OLANZapine zydis (ZYPREXA) disintegrating tablet 10 mg  10 mg Oral QHS White, Patrice L, NP   10 mg at 01/17/22 2125   traZODone (DESYREL) tablet 50 mg  50 mg Oral QHS PRN Lenard Lance, FNP   50 mg at 01/16/22 2106   ziprasidone (GEODON) injection 20 mg  20 mg Intramuscular PRN Lenard Lance, FNP       Current Outpatient Medications  Medication Sig Dispense Refill   [START ON 01/19/2022] OLANZapine (ZYPREXA) 5 MG tablet Take 1 tablet (5 mg total) by mouth daily.     OLANZapine zydis (ZYPREXA) 10 MG disintegrating tablet Take 1 tablet (10 mg total) by mouth at bedtime.      PTA Medications: (Not in a hospital admission)   Musculoskeletal  Strength & Muscle Tone: within normal limits Gait & Station: normal Patient leans: N/A  Psychiatric Specialty Exam  Presentation  General Appearance: Disheveled; Bizarre  Eye Contact:Minimal  Speech:Clear and Coherent  Speech Volume:Decreased  Handedness:Right   Mood and Affect  Mood:Anxious; Labile  Affect:Congruent   Thought Process  Thought Processes:Disorganized  Descriptions of Associations:Intact  Orientation:Full (Time, Place and Person)  Thought Content:Paranoid Ideation  Diagnosis of Schizophrenia or Schizoaffective disorder in past: No  Duration of Psychotic Symptoms: Less than six months   Hallucinations:Hallucinations: None  Ideas of Reference:None  Suicidal Thoughts:Suicidal Thoughts: No  Homicidal Thoughts:Homicidal Thoughts: No   Sensorium  Memory:Recent Poor; Immediate Poor; Remote Poor  Judgment:Poor  Insight:Poor   Executive Functions  Concentration:Poor  Attention Span:Poor  Recall:Poor  Fund of Knowledge:Poor  Language:Poor   Psychomotor Activity  Psychomotor Activity:Psychomotor Activity: Normal   Assets  Assets:Physical Health; Resilience   Sleep  Sleep:Sleep: Fair   No data recorded  Physical Exam  Physical Exam Vitals  and nursing note reviewed.  Constitutional:      General: She is not in acute distress.    Appearance: Normal appearance. She is not ill-appearing.  HENT:     Head: Normocephalic.  Eyes:     General:        Right eye: No discharge.        Left eye: No discharge.     Conjunctiva/sclera: Conjunctivae normal.  Cardiovascular:     Rate and Rhythm: Normal rate.  Pulmonary:     Effort: Pulmonary effort is normal. No respiratory distress.  Musculoskeletal:        General: Normal range of motion.     Cervical back: Normal range of motion.  Skin:    Coloration: Skin is not jaundiced or pale.  Neurological:     Mental  Status: She is alert and oriented to person, place, and time.  Psychiatric:        Attention and Perception: She is inattentive.        Mood and Affect: Mood is anxious.        Speech: Speech normal.        Behavior: Behavior is cooperative.        Thought Content: Thought content is paranoid.        Cognition and Memory: Cognition normal.        Judgment: Judgment is impulsive.   Review of Systems  Constitutional: Negative.   HENT: Negative.    Eyes: Negative.   Respiratory: Negative.    Cardiovascular: Negative.   Musculoskeletal: Negative.   Skin: Negative.   Neurological: Negative.   Psychiatric/Behavioral:  The patient is nervous/anxious.   Blood pressure 106/84, pulse (!) 108, temperature 98.3 F (36.8 C), temperature source Oral, resp. rate 18, SpO2 98 %. There is no height or weight on file to calculate BMI.  Demographic Factors:  Caucasian, Low socioeconomic status, Living alone, and Unemployed  Loss Factors: Financial problems/change in socioeconomic status  Historical Factors: Impulsivity  Risk Reduction Factors:   NA  Continued Clinical Symptoms:  Bipolar Disorder:   Depressive phase Postpartum Depression Alcohol/Substance Abuse/Dependencies Schizophrenia:   Paranoid or undifferentiated type More than one psychiatric diagnosis  Cognitive  Features That Contribute To Risk:  None    Suicide Risk:  Minimal: No identifiable suicidal ideation.  Patients presenting with no risk factors but with morbid ruminations; may be classified as minimal risk based on the severity of the depressive symptoms  Plan Of Care/Follow-up recommendations:  Activity:  as tolerated  Diet:  regular   Disposition: [facility]charge and [facility].  She will be transported via [employer].  Labwork reviewed: Free T3 an T4 ordered due to low TSH of 0.204  Admission orders placed  Ardis Hughsarolyn H Tamaj Jurgens, NP 01/18/2022, 12:56 PM

## 2022-01-18 NOTE — ED Notes (Signed)
Report called to Vikki Ports,  I will keep an eye out for COVID results before we call for transport. ?

## 2022-01-18 NOTE — Progress Notes (Signed)
[facility] LCSW Progress Note ?  ?01/18/2022    10:40 AM ? ?Barbara Gordon  ? ?[id number]  ? ?Type of Contact and Topic:  Psychiatric Bed Placement  ? ?Pt accepted to [facility] 307-2   ? ?Patient meets inpatient criteria per Vernard Gambles, NP  ? ?The attending provider will be Haze Rushing, MD   ? ?Call report to [phone]   ? ?Erick Alley, RN @ [facility] notified.    ? ?Pt scheduled  to arrive at [facility] TODAY at 1330-1400.  ? ?Damita Dunnings, MSW, LCSW-A  ?2:59 PM 01/18/2022   ?  ? ?  ?  ? ? ? ? ?  ?

## 2022-01-18 NOTE — ED Notes (Signed)
Currently another attempt at EKG being performed.  ?

## 2022-01-18 NOTE — ED Notes (Signed)
[employer] here at this time for transport.   ?

## 2022-01-18 NOTE — Discharge Instructions (Addendum)
Transfer to [facility] for inpatient psychiatric admission  °

## 2022-01-18 NOTE — ED Notes (Signed)
Patient was given EKG. Patient responded to internal stimuli  aeb questioning her care and why she is here quoting "they are making me do things."  Patient is restless. Patient returned to unit aeb MHT support. Patient is safe on unit with continued monitoring.  ?

## 2022-01-18 NOTE — ED Notes (Signed)
Patient resting quietly with eyes closed, no S/S of distress. Respiration even and unlabored./ Will continue to monitor for safety ?

## 2022-01-18 NOTE — Progress Notes (Signed)
Pt has been sleep since shift start due to receiving medication prior to shift ? ? ? 01/18/22 2100  ?Psych Admission Type (Psych Patients Only)  ?Admission Status Involuntary  ?Psychosocial Assessment  ?Patient Complaints None  ?Eye Contact Brief  ?Facial Expression Flat  ?Affect Anxious  ?Speech Logical/coherent  ?Interaction Minimal  ?Motor Activity Slow  ?Appearance/Hygiene Unremarkable  ?Behavior Characteristics Cooperative  ?Mood Labile  ?Aggressive Behavior  ?Effect No apparent injury  ?Thought Process  ?Coherency Blocking  ?Content UTA  ?Delusions UTA  ?Perception UTA  ?Hallucination UTA  ?Judgment Limited  ?Confusion Moderate  ?Danger to Self  ?Current suicidal ideation? Denies  ?Danger to Others  ?Danger to Others None reported or observed  ? ? ?

## 2022-01-19 ENCOUNTER — Inpatient Hospital Stay (HOSPITAL_COMMUNITY)
Admission: AD | Admit: 2022-01-19 | Discharge: 2022-01-19 | Disposition: A | Discharge status: Home / Self Care | Payer: Federal, State, Local not specified - Other | Source: Intra-hospital | Attending: Emergency Medicine | Admitting: Emergency Medicine

## 2022-01-19 ENCOUNTER — Other Ambulatory Visit (HOSPITAL_COMMUNITY): Payer: Medicaid Other

## 2022-01-19 DIAGNOSIS — F29 Unspecified psychosis not due to a substance or known physiological condition: Principal | ICD-10-CM

## 2022-01-19 DIAGNOSIS — F12959 Cannabis use, unspecified with psychotic disorder, unspecified: Secondary | ICD-10-CM | POA: Diagnosis present

## 2022-01-19 DIAGNOSIS — F1210 Cannabis abuse, uncomplicated: Secondary | ICD-10-CM | POA: Diagnosis present

## 2022-01-19 DIAGNOSIS — Z87898 Personal history of other specified conditions: Secondary | ICD-10-CM

## 2022-01-19 LAB — HIV ANTIBODY (ROUTINE TESTING W REFLEX): HIV Screen 4th Generation wRfx: NONREACTIVE

## 2022-01-19 LAB — SEDIMENTATION RATE: Sed Rate: 2 mm/hr (ref 0–22)

## 2022-01-19 LAB — T3, FREE: T3, Free: 3.4 pg/mL (ref 2.0–4.4)

## 2022-01-19 LAB — VITAMIN B12: Vitamin B-12: 222 pg/mL (ref 180–914)

## 2022-01-19 MED ORDER — GADOBUTROL 1 MMOL/ML IV SOLN
5.0000 mL | Freq: Once | INTRAVENOUS | Status: AC | PRN
  Administered 2022-01-19: 5 mL via INTRAVENOUS

## 2022-01-19 MED ORDER — THIAMINE HCL 100 MG PO TABS
100.0000 mg | ORAL_TABLET | Freq: Every day | ORAL | Status: DC
  Administered 2022-01-20 – 2022-01-30 (×11): 100 mg via ORAL
  Filled 2022-01-19 (×6): qty 1
  Filled 2022-01-19: qty 7
  Filled 2022-01-19 (×7): qty 1

## 2022-01-19 MED ORDER — LOPERAMIDE HCL 2 MG PO CAPS: 2.0000 mg | ORAL_CAPSULE | ORAL | Status: AC | PRN

## 2022-01-19 MED ORDER — HYDROXYZINE HCL 25 MG PO TABS
25.0000 mg | ORAL_TABLET | Freq: Four times a day (QID) | ORAL | Status: AC | PRN
  Administered 2022-01-21 – 2022-01-22 (×2): 25 mg via ORAL
  Filled 2022-01-19 (×2): qty 1

## 2022-01-19 MED ORDER — LORAZEPAM 1 MG PO TABS: 1.0000 mg | ORAL_TABLET | Freq: Four times a day (QID) | ORAL | Status: AC | PRN

## 2022-01-19 MED ORDER — OLANZAPINE 5 MG PO TBDP
5.0000 mg | ORAL_TABLET | Freq: Every day | ORAL | Status: DC
  Administered 2022-01-20 – 2022-01-24 (×5): 5 mg via ORAL
  Filled 2022-01-19 (×8): qty 1

## 2022-01-19 MED ORDER — ADULT MULTIVITAMIN W/MINERALS CH
1.0000 | ORAL_TABLET | Freq: Every day | ORAL | Status: DC
  Administered 2022-01-19 – 2022-01-30 (×12): 1 via ORAL
  Filled 2022-01-19 (×15): qty 1

## 2022-01-19 MED ORDER — ONDANSETRON 4 MG PO TBDP: 4.0000 mg | ORAL_TABLET | Freq: Four times a day (QID) | ORAL | Status: AC | PRN

## 2022-01-19 NOTE — Progress Notes (Signed)
Patient was sent to ED for MR and returned to the unit with no major sign of distress.  ?

## 2022-01-19 NOTE — Group Note (Signed)
Recreation Therapy Group Note ? ? ?Group Topic:Animal Assisted Therapy   ?Group Date: 01/19/2022 ?Start Time: 1430 ?End Time: 1515 ?Facilitators: Caroll Rancher, LRT,CTRS ?Location: [address] Day[address] ? ? ?Animal-Assisted Activity (AAA) Program Checklist/Progress Note ?Patient Eligibility Criteria Checklist & Daily Group note for Rec Tx Intervention ? ? ?AAA/T Program Assumption of Risk Form signed by Patient/ or Parent Legal Guardian YES ? ?Patient understands their participation is voluntary YES ? ? ?Group Description: Patients provided opportunity to interact with trained and credentialed [employer] Therapy dog and the community volunteer/dog handler. Patients practiced appropriate animal interaction and were educated on dog safety outside of the hospital in common community settings. Patients were allowed to use dog toys and other items to practice commands, engage the dog in play, and/or complete routine aspects of animal care.  ? ?Education: Charity fundraiser, Health visitor, Communication & Social Skills  ? ? ?Affect/Mood: N/A ?  ?Participation Level: Did not attend ?  ? ?Clinical Observations/Individualized Feedback:   ? ? ?Plan: Continue to engage patient in RT group sessions 2-3x/week. ? ? ?Caroll Rancher, LRT,CTRS ?01/19/2022 3:49 PM ?

## 2022-01-19 NOTE — Progress Notes (Signed)
Adult Psychoeducational Group Note ? ?Date:  01/19/2022 ?Time:  11:09 PM ? ?Group Topic/Focus:  ?Wrap-Up Group:   The focus of this group is to help patients review their daily goal of treatment and discuss progress on daily workbooks. ? ?Participation Level:  Did Not Attend ? ?Participation Quality:   Did Not Attend ? ?Affect:   Did Not Attend ? ?Cognitive:   Did Not Attend ? ?Insight: None ? ?Engagement in Group:   Did Not Attend ? ?Modes of Intervention:   Did Not Attend ? ?Additional Comments:  Pt was encouraged to attend wrap up group but did not attend. ? ?Felipa Furnace ?01/19/2022, 11:09 PM ?

## 2022-01-19 NOTE — Progress Notes (Signed)
NUTRITION ASSESSMENT ? ?Pt identified as at risk on the Malnutrition Screen Tool ? ?INTERVENTION: ?1. Supplements: Ensure Plus High Protein po BID, each supplement provides 350 kcal and 20 grams of protein.  ? ?NUTRITION DIAGNOSIS: ?Unintentional weight loss related to sub-optimal intake as evidenced by pt report.  ? ?Goal: ?Pt to meet >/= 90% of their estimated nutrition needs. ? ?Monitor:  ?PO intake ? ?Assessment:  ?Pt admitted for substance induced psychosis. Pt reports not eating or drinking PTA. Weight has increased per weight records. Ensure supplements have been ordered. ? ?Height: ?Ht Readings from Last 1 Encounters:  ?01/18/22 5\' 1"  (1.549 m)  ? ? ?Weight: ?Wt Readings from Last 1 Encounters:  ?01/18/22 51.2 kg  ? ? ?Weight Hx: ?Wt Readings from Last 10 Encounters:  ?01/18/22 51.2 kg  ?05/22/21 41 kg  ?04/16/21 47.6 kg  ? ? ?BMI:  Body mass index is 21.31 kg/m?Marland Kitchen ?Pt meets criteria for normal based on current BMI. ? ?Estimated Nutritional Needs: ?Kcal: 25-30 kcal/kg ?Protein: > 1 gram protein/kg ?Fluid: 1 ml/kcal ? ?Diet Order:  ?Diet Order   ? ?       ?  Diet regular Room service appropriate? Yes; Fluid consistency: Thin  Diet effective now       ?  ? ?  ?  ? ?  ? ?Pt is also offered choice of unit snacks mid-morning and mid-afternoon.  ?Pt is eating as desired.  ? ?Lab results and medications reviewed.  ? ?Clayton Bibles, MS, RD, LDN ?Inpatient Clinical Dietitian ?Contact information available via Amion ? ? ?

## 2022-01-19 NOTE — Procedures (Signed)
Patient Name: Barbara Gordon  ?MRN: [id number]  ?Epilepsy Attending: Charlsie Quest  ?Referring Physician/Provider: Comer Locket, MD ?Date: 01/19/2022  ?Duration: 21.31 mins ? ?Patient history: 31 y.o Caucasian female who initially walked into this [facility] 01[facility]08/22 accompanied by a female who stated that she ingested something. Prior to being assessed at that time, pt began having a seizure like activity, foaming at the mouth lasting 2 minutes. EEG to evaluate for seizure ? ?Level of alertness: Awake ? ?AEDs during EEG study: None ? ?Technical aspects: This EEG study was done with scalp electrodes positioned according to the 10-20 International system of electrode placement. Electrical activity was acquired at a sampling rate of 500Hz  and reviewed with a high frequency filter of 70Hz  and a low frequency filter of 1Hz . EEG data were recorded continuously and digitally stored.  ? ?Description: The posterior dominant rhythm consists of 11 Hz activity of moderate voltage (25-35 uV) seen predominantly in posterior head regions, symmetric and reactive to eye opening and eye closing. Hyperventilation did not show any EEG change. Photic stimulation was not performed.    ? ?IMPRESSION: ?This study is within normal limits. No seizures or epileptiform discharges were seen throughout the recording. ? ?  ? ?

## 2022-01-19 NOTE — [facility] Counselor (Signed)
Adult Comprehensive Assessment ? ?Patient ID: Barbara Gordon, female   DOB: 16-Jul-1991, 31 y.o.   MRN: [id number] ? ?Information Source: ?Information source: Patient ? ?Current Stressors:  ?Patient states their primary concerns and needs for treatment are:: "I am not sure" ?Patient states their goals for this hospitilization and ongoing recovery are:: "I am not sure" ?Educational / Learning stressors: no stressors ?Employment / Job issues: patient states she doesn't have a job ?Family Relationships: good ?Financial / Lack of resources (include bankruptcy): "doing alright" ?Housing / Lack of housing: "staying with friends" ?Physical health (include injuries & life threatening diseases): "alright" ?Social relationships: "friends" ?Substance abuse: patient reports no substance use ?Bereavement / Loss: "no" ? ?Living/Environment/Situation:  ?Living Arrangements: Non-relatives/Friends ?Living conditions (as described by patient or guardian): patient states that she is living with her friend, Barbara Gordon ?Who else lives in the home?: Barbara Gordon and his mother ?How long has patient lived in current situation?: patient living with Barbara Gordon sometime between Thanksgiving and Christmas ?What is atmosphere in current home: Comfortable, Supportive ? ?Family History:  ?Marital status: Single ?Are you sexually active?: No ?What is your sexual orientation?: straight ?Has your sexual activity been affected by drugs, alcohol, medication, or emotional stress?: none ?Does patient have children?: Yes ?How many children?: 1 ?How is patient's relationship with their children?: Pt reports, she has an 48 year old son. Pt became suspicious of questions about her son. Patient states that he lives with his grandmother ? ?Childhood History:  ?By whom was/is the patient raised?: Both parents ?Additional childhood history information: "good" ?Description of patient's relationship with caregiver when they were a child: "good" ?Patient's description of current  relationship with people who raised him/her: mom-okay, dad- dead ?How were you disciplined when you got in trouble as a child/adolescent?: "I don't know" ?Does patient have siblings?: No ?Did patient suffer any verbal/emotional/physical/sexual abuse as a child?: No ?Did patient suffer from severe childhood neglect?: No ?Has patient ever been sexually abused/assaulted/raped as an adolescent or adult?: No ?Type of abuse, by whom, and at what age: patient reports no abuse ?Was the patient ever a victim of a crime or a disaster?: No ?How has this affected patient's relationships?: [facility]TA ?Spoken with a professional about abuse?: No ?Does patient feel these issues are resolved?: No ?Witnessed domestic violence?: No ?Has patient been affected by domestic violence as an adult?: No ?Description of domestic violence: none ? ?Education:  ?Highest grade of school patient has completed: 12 ?Currently a student?: No ?Learning disability?: No ? 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 ?Financial Resources:   ?Museum/gallery curator resources:  (income from friends) ?Does patient have a representative payee or guardian?: No ? ?Alcohol/Substance Abuse:   ?What has been your use of drugs/alcohol within the last 12 months?: none reported ?If attempted suicide, did drugs/alcohol play a role in this?: No ?Alcohol/Substance Abuse Treatment Hx: Denies past history ?If yes, describe treatment: none ?Has alcohol/substance abuse ever caused legal problems?: No ? ?Social Support System:   ?Patient's Community Support System: Good ?Describe Community Support System: friends, family ?Type of faith/religion: christian ?How does patient's faith help to cope with current illness?: don't use ? ?Leisure/Recreation:    ?Do You Have Hobbies?: Yes ?Leisure and Hobbies: hang out with friends ? ?Strengths/Needs:   ?  What is the patient's perception of their strengths?: "I don't know" ?Patient states they can use these personal strengths during their treatment to contribute to their recovery: "I don't know" ?Patient states these barriers may affect/interfere with their treatment: none ?Patient states these barriers may affect their return to the community: none ?Other important information patient would like considered in planning for their treatment: none ? ?Discharge Plan:   ?Currently receiving community mental health services: No ?Patient states concerns and preferences for aftercare planning are: none ?Patient states they will know when they are safe and ready for discharge when: none ?Does patient have access to transportation?: Yes ?Does patient have financial barriers related to discharge medications?: Yes ?Patient description of barriers related to discharge medications: insurance ?Will patient be returning to same living situation after discharge?: Yes ? ?Summary/Recommendations:   ?Summary and Recommendations (to be completed by the evaluator): Barbara Gordon is a 31 year old female who was brought for evaluation by her friend, Barbara Gordon.  Patient reports that she does not know why she is here.  During this assessment, patient was guarded and answered questions with one ward answers.  Patient answered most questions with "I don't know" and questioned why CSW was asking questions.  Patient reports that she currently lives with a friend.  She currently has no icome and no insurance.  She denies being connected to outpatient mental health providers.  With a chart review, patient was brought for evaluation after changes in behavior and a catatonic state. Friend reports that patient took a substance and seemed that "her mind was swept clean."  While here, Barbara Gordon can benefit from crisis stabilization, medication management, therapeutic milieu,  and referrals for services. ? ?Barbara Gordon E Hesston Hitchens. 01/19/2022 ?
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
## 2022-01-19 NOTE — [facility] Suicide Risk Assessment (Signed)
Suicide Risk Assessment ? ?Admission Assessment    ?[facility] Admission Suicide Risk Assessment ? ? ?Nursing information obtained from:  Patient ?Demographic factors:  Caucasian, Unemployed, Adolescent or young adult, Low socioeconomic status ?Current Mental Status:  NA ?Loss Factors:  NA ?Historical Factors:  NA ?Risk Reduction Factors:  NA ? ?Total Time spent with patient: 1 hour ?Principal Problem: 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HCC) ?Diagnosis:  Principal Problem: ?  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HCC) ?Active Problems: ?  History of seizure ?  Cannabis abuse ? ?History of Present Illness: Barbara Gordon is a 31 y.o Caucasian female who [facility] into this [place] [facility] 01[facility]08/22 accompanied by a female who stated that she ingested something. Prior to being assessed at that time, pt began having a seizure like activity, foaming at the mouth lasting 2 minutes. Pt was transferred to [facility] ED, requested to be discharged from there, and presented back to [facility] on 3/4, asked for mental health resources and left. For this hospitalization, "pt was IVC'd by a friend [name] [place], [phone]). Per IVC paperwork:  ?"According to friend, respondent has taken some sort of narcotic substance that has affected her ability to take care of herself.  Friend states that what ever she has done/taken has "erased her mind", she is talking to herself, she is paranoid, she has not been eating or drinking regularly for days." ([facility], 01/15/22). Pt was determined to be  a danger to herself and transferred to this this [facility] for treatment and stabilization of her mood.  ? ?Continued Clinical Symptoms: Mood is dysphoric, she is irritable, anxious, angry, restless & agitated with questions being asked. Affect is congruent. She is bizarre, pt is psychotic (paranoia), seems to be responding to internal  stimuli as she talks to her self repeatedly. She is need of of continuous hospitalization for treatment and stabilization of her mood. ?  ?The "Alcohol Use Disorders Identification Test", Guidelines for Use in Primary Care, Second Edition.  World Science writer [facility]). ?Score between 0-7:  no or low risk or alcohol related problems. ?Score between 8-15:  moderate risk of alcohol related problems. ?Score between 16-19:  high risk of alcohol related problems. ?Score 20 or above:  warrants further diagnostic evaluation for alcohol dependence and treatment. ? ?CLINICAL FACTORS:  ? Severe Anxiety and/or Agitation ?Previous Psychiatric Diagnoses and Treatments ? ?Musculoskeletal: ?Strength & Muscle Tone: within normal limits ?Gait & Station: normal ?Patient leans: N/A ? ?Psychiatric Specialty Exam: ? ?Presentation  ?General Appearance: Bizarre ? ?Eye Contact:Fair ? ?Speech:Pressured ? ?Speech Volume:Increased ? ?Handedness:Right ? ?Mood and Affect  ?Mood:Dysphoric; Hopeless; Anxious; Angry ? ?Affect:Congruent ? ?Thought Process  ?Thought Processes:Coherent ? ?Descriptions of Associations:Intact ? ?Orientation:Full (Time, Place and Person) ? ?Thought Content:Illogical ? ?History of Schizophrenia/Schizoaffective disorder:No ? ?Duration of Psychotic Symptoms:Greater than six months ? ?Hallucinations:Hallucinations: Auditory ? ?Ideas of Reference:Paranoia ? ?Suicidal Thoughts:Suicidal Thoughts: No ? ?Homicidal Thoughts:Homicidal Thoughts: No ? ? ?Sensorium  ?Memory:Immediate Good ? ?Judgment:Poor ? ?Insight:Poor ? ? ?Executive Functions  ?Concentration:Poor ? ?Attention Span:Poor ? ?Recall:Poor ? ?Fund of Knowledge:Poor ? ?Language:Fair ? ? ?Psychomotor Activity  ?Psychomotor Activity:Psychomotor Activity: Normal ? ? ?Assets  ?Assets:Social Support ? ? ?Sleep  ?Sleep:Sleep: Fair ? ? ? ?Physical Exam: ?Physical Exam ?Constitutional:   ?   Appearance: Normal appearance.  ?HENT:  ?   Head: Normocephalic.  ?   Nose: Nose  normal. No congestion or rhinorrhea.  ?  Eyes:  ?   Pupils: Pupils are equal, round, and reactive to light.  ?Pulmonary:  ?   Effort: Pulmonary effort is normal.  ?Musculoskeletal:     ?   General: Normal range of motion.  ?   Cervical back: Normal range of motion. No rigidity.  ?Neurological:  ?   Mental Status: She is alert and oriented to person, place, and time.  ? ?Review of Systems  ?Constitutional: Negative.  Negative for fever.  ?HENT: Negative.  Negative for sore throat.   ?Eyes: Negative.  Negative for blurred vision.  ?Respiratory: Negative.  Negative for cough.   ?Cardiovascular: Negative.  Negative for chest pain.  ?Gastrointestinal: Negative.   ?Genitourinary: Negative.   ?Musculoskeletal: Negative.   ?Skin: Negative.   ?Endo/Heme/Allergies:   ?     Allergies: Latex and Tramadol  ?Psychiatric/Behavioral:  Positive for hallucinations. The patient is nervous/anxious.   ?Blood pressure (!) 120/91, pulse (!) 118, temperature 99.1 ?F (37.3 ?C), temperature source Oral, resp. rate 16, height 5\' 1"  (1.549 m), weight 51.2 kg, SpO2 98 %. Body mass index is 21.31 kg/m?. ? ? ?COGNITIVE FEATURES THAT CONTRIBUTE TO RISK:  ?None   ? ?SUICIDE RISK:  ? Moderate:  No SI/HI at this time, but presents with psychosis which presents a danger to self and others. ? ?PLAN OF CARE:  ?PLAN ?Safety and Monitoring: ?Voluntary admission to inpatient psychiatric unit for safety, stabilization and treatment ?Daily contact with patient to assess and evaluate symptoms and progress in treatment ?Patient's case to be discussed in multi-disciplinary team meeting ?Observation Level : q15 minute checks ?Vital signs: q12 hours ?Precautions: suicide, elopement, and assault ?  ?Unspecified Psychosis ?-Continue Zyprexa 5 mg in the mornings and 10 mg nightly ?  ?Insomnia ?Continue Trazodone 50 mg nightly PRN ?  ?Alcohol use disorder ?Monitor for signs of withdrawal ?-Start Ativan 1mg  tabs every 6 hours PRN for CIWA >10  ?- Oral thiamine and MVI  replacement ?- Abstinence from substances encouraged - SW to look into options for outpatient SA treatment at discharge  ?  ?Other PRNS ?-Continue Tylenol 650 mg every 6 hours PRN for mild pain ?-Continue Maalox 30 mg every 4 hrs PRN for indigestion ?-Continue Imodium 2-4 mg as needed for diarrhea ?-Continue Milk of Magnesia as needed every 6 hrs for constipation ?-Continue Zofran disintegrating tabs every 6 hrs PRN for nausea  ?  ?Long Term Goal(s): Improvement in symptoms so as ready for discharge ?  ?Short Term Goals: Ability to identify changes in lifestyle to reduce recurrence of condition will improve, Ability to verbalize feelings will improve, Ability to disclose and discuss suicidal ideas, Ability to demonstrate self-control will improve, Ability to identify and develop effective coping behaviors will improve, Ability to maintain clinical measurements within normal limits will improve, Compliance with prescribed medications will improve, and Ability to identify triggers associated with substance abuse/mental health issues will improve ?  ?Physician Treatment Plan for Secondary Diagnosis:  ?Principal Problem: ?  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HCC) ?Active Problems: ?  History of seizure ?  Cannabis abuse ?  ?Discharge Planning: ?Social work and case management to assist with discharge planning and identification of hospital follow-up needs prior to discharge ?Estimated LOS: 5-7 days ?Discharge Concerns: Need to establish a safety plan; Medication compliance and effectiveness ?Discharge Goals: Return home with outpatient referrals for mental health follow-up including medication management/psychotherapy ? ?I certify that inpatient services furnished can reasonably be expected to improve the  patient's condition.  ? ?Starleen Blueoris  Tyronn Golda, NP ?01/19/2022, 3:17 PM ?

## 2022-01-19 NOTE — Progress Notes (Signed)
EEG completed, results pending. 
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
## 2022-01-19 NOTE — Plan of Care (Signed)
I contacted Dr. Amada Jupiter with neurology regarding management recommendations after patient had a witnessed seizure earlier this month. Given the fact that the patient states she is unaware of having had previous seizures, he feels that an isolated seizure incident does not warrant start of scheduled antiepileptic medication at this time. He does recommend that she have an EEG and MRI of the brain which have been ordered.  ? ?Bartholomew Crews, MD, FAPA ?

## 2022-01-20 ENCOUNTER — Encounter (HOSPITAL_COMMUNITY): Payer: Self-pay

## 2022-01-20 DIAGNOSIS — F29 Unspecified psychosis not due to a substance or known physiological condition: Secondary | ICD-10-CM | POA: Diagnosis not present

## 2022-01-20 LAB — URINALYSIS, ROUTINE W REFLEX MICROSCOPIC
Bilirubin Urine: NEGATIVE
Glucose, UA: NEGATIVE mg/dL
Hgb urine dipstick: NEGATIVE
Ketones, ur: NEGATIVE mg/dL
Nitrite: NEGATIVE
Protein, ur: NEGATIVE mg/dL
Specific Gravity, Urine: 1.015 (ref 1.005–1.030)
pH: 7.5 (ref 5.0–8.0)

## 2022-01-20 LAB — RPR: RPR Ser Ql: NONREACTIVE

## 2022-01-20 LAB — HEAVY METALS, BLOOD
Arsenic: 1 ug/L (ref 0–9)
Lead: 1 ug/dL (ref 0.0–3.4)
Mercury: <1 ug/L (ref 0.0–14.9)

## 2022-01-20 LAB — ANA: Anti Nuclear Antibody (ANA): NEGATIVE

## 2022-01-20 LAB — URINALYSIS, MICROSCOPIC (REFLEX): WBC, UA: >50 WBC/hpf (ref 0–5)

## 2022-01-20 LAB — CERULOPLASMIN: Ceruloplasmin: 23.3 mg/dL (ref 19.0–39.0)

## 2022-01-20 LAB — VITAMIN D 25 HYDROXY (VIT D DEFICIENCY, FRACTURES): Vit D, 25-Hydroxy: 20.48 ng/mL — ABNORMAL LOW (ref 30–100)

## 2022-01-20 MED ORDER — LAMOTRIGINE 25 MG PO TABS
25.0000 mg | ORAL_TABLET | Freq: Every day | ORAL | Status: DC
  Administered 2022-01-20 – 2022-01-30 (×11): 25 mg via ORAL
  Filled 2022-01-20 (×8): qty 1
  Filled 2022-01-20: qty 11
  Filled 2022-01-20 (×6): qty 1

## 2022-01-20 NOTE — Progress Notes (Addendum)
[facility] MD Progress Note ? ?01/20/2022 3:36 PM ?Barbara Gordon  ?MRN:  [id number] ? ?Subjective: Barbara Gordon reports: "I am feeling alright. I feel tired though." ? ?Today's assessment (01/20/2022): Pt's chart reviewed, case discussed with her treatment team. Pt with flat affect and depressed mood, attention to personal hygiene and grooming is fair, eye contact is good, speech is clear & coherent. Thought contents are more organized and logical, and pt currently denies SI/HI/AVH or paranoia. There is no evidence of delusional thoughts.  Pt is less restless today, she is still somewhat guarded, but there is an improvement in her presentation as compared to yesterday. ? ?Pt reports a fair appetite, reports a good sleep quality last night. As per flow sheets, she slept for 6 hrs last night. She denies being in any physical distress, and denies any medication related side effects. Pt with no tremors, AIMS:0. She is reporting that she is tolerating her medications. Will continue current medications as listed below since there is some improvement in pt's symptoms. HR is currently elevated at 111. Will continue to monitor this. ? ?Labs Reviewed: CMP, Lipid profile (HDL is 36, LDL-133)-will need PCP follow up and pt educated on healthy diet and exercise as psychosis resolves. CBC & Hemoglobin A1C WNL, TSH low at 0.0204, but Free T3, Free T4 WNL. Tox screen positive for THC & Benzos. EKG WNL with QTC of 442. Urinalysis with urine cloudy, amber with few bacteria.  Repeat urinalysis pending. Pt is asymptomatic. Psychosis labs ordered (ANA, Ceruplasmin, Heavy metal, HIV, RPR, Sedimentation rate, Vitamin B1, Vitamin B12 pending). Vitamin D level also ordered and pending. Labs were scheduled to be drawn earlier today, and not drawn, and rescheduled for tomorrow morning.  ? ?HPI:  Barbara Gordon is a 31 y.o Caucasian female who initially walked into this [place] [facility] on 01/08/22 accompanied by a female who stated that she  ingested something. Prior to being assessed at that time, pt began having a seizure like activity, foaming at the mouth lasting 2 minutes. Pt [facility], requested to be discharged from there, and [facility], asked for mental health resources and left. For this hospitalization, "pt was IVC'd by a friend [name]([name], [phone]). Per IVC paperwork:  ?"According to friend, respondent has taken some sort of narcotic substance that has affected her ability to take care of herself.  Friend states that what ever she has done/taken has "erased her mind", she is talking to herself, she is paranoid, she has not been eating or drinking regularly for days." ([facility], 01/15/22). Pt was determined to be  a danger to herself and [facility] and stabilization of her mood.  ?  ?Principal Problem: 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HCC) ?Diagnosis: Principal Problem: ?  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HCC) ?Active Problems: ?  History of seizure ?  Cannabis abuse ? ?Total Time spent with patient: 20 minutes ? ?Past Psychiatric History: As above ? ?Past Medical History: History reviewed. No pertinent past medical history. History reviewed. No pertinent surgical history. ?Family History: History reviewed. No pertinent family history. ?Family Psychiatric  History: none provided ?Social History:  ?Social History  ? ?Substance and Sexual Activity  ?Alcohol Use None  ?   ?Social History  ? ?Substance and Sexual Activity  ?Drug Use Never  ?  ?Social History  ? ?Socioeconomic History  ?  Marital status: Single  ?  Spouse name: Not on file  ? Number of children: Not on file  ? Years of education: Not on file  ? Highest education level: Not on file  ?Occupational History  ? Not on file  ?Tobacco Use  ? Smoking status: Some Days  ?  Types: Cigarettes  ? Smokeless  tobacco: Never  ?Substance and Sexual Activity  ? Alcohol use: Not on file  ? Drug use: Never  ? Sexual activity: Never  ?Other Topics Concern  ? Not on file  ?Social History Narrative  ? Not on file  ? ?Social Determinants of Health  ? ?Financial Resource Strain: Not on file  ?Food Insecurity: Not on file  ?Transportation Needs: Not on file  ?Physical Activity: Not on file  ?Stress: Not on file  ?Social Connections: Not on file  ? ?Sleep: Good ? ?Appetite:  Fair ? ?Current Medications: ?Current Facility-Administered Medications  ?Medication Dose Route Frequency Provider Last Rate Last Admin  ? acetaminophen (TYLENOL) tablet 650 mg  650 mg Oral Q6H PRN Ardis Hughs, NP      ? alum & mag hydroxide-simeth (MAALOX/MYLANTA) 200-200-20 MG/5ML suspension 30 mL  30 mL Oral Q4H PRN Ardis Hughs, NP      ? feeding supplement (ENSURE ENLIVE / ENSURE PLUS) liquid 237 mL  237 mL Oral BID BM Massengill, Harrold Donath, MD   237 mL at 01/20/22 1358  ? hydrOXYzine (ATARAX) tablet 25 mg  25 mg Oral Q6H PRN Comer Locket, MD      ? lamoTRIgine (LAMICTAL) tablet 25 mg  25 mg Oral Daily Comer Locket, MD   25 mg at 01/20/22 7673  ? loperamide (IMODIUM) capsule 2-4 mg  2-4 mg Oral PRN Comer Locket, MD      ? OLANZapine zydis (ZYPREXA) disintegrating tablet 10 mg  10 mg Oral Q8H PRN Ardis Hughs, NP      ? And  ? LORazepam (ATIVAN) tablet 1 mg  1 mg Oral PRN Ardis Hughs, NP      ? And  ? ziprasidone (GEODON) injection 20 mg  20 mg Intramuscular PRN Ardis Hughs, NP      ? LORazepam (ATIVAN) tablet 1 mg  1 mg Oral Q6H PRN Mason Jim, Lillieann Pavlich E, MD      ? magnesium hydroxide (MILK OF MAGNESIA) suspension 30 mL  30 mL Oral Daily PRN Ardis Hughs, NP      ? multivitamin with minerals tablet 1 tablet  1 tablet Oral Daily Comer Locket, MD   1 tablet at 01/20/22 0756  ? nicotine polacrilex (NICORETTE) gum 2 mg  2 mg Oral PRN Phineas Inches, MD   2 mg at 01/19/22 1514  ? OLANZapine zydis (ZYPREXA)  disintegrating tablet 10 mg  10 mg Oral QHS Ardis Hughs, NP   10 mg at 01/19/22 2116  ? OLANZapine zydis (ZYPREXA) disintegrating tablet 5 mg  5 mg Oral Daily Bartholomew Crews E, MD   5 mg at 01/20/22 0756  ? ondansetron (ZOFRAN-ODT) disintegrating tablet 4 mg  4 mg Oral Q6H PRN Bartholomew Crews E, MD      ? thiamine tablet 100 mg  100 mg Oral Daily Mason Jim, Cyril Woodmansee E, MD   100 mg at 01/20/22 0756  ? traZODone (DESYREL) tablet 50 mg  50 mg Oral QHS PRN Ardis Hughs, NP      ? ? ?Lab Results:  ?Results for orders placed or performed during the  hospital encounter of 01/18/22 (from the past 48 hour(s))  ?HIV Antibody (routine testing w rflx)     Status: None  ? Collection Time: 01/19/22  6:27 PM  ?Result Value Ref Range  ? HIV Screen 4th Generation wRfx Non Reactive Non Reactive  ?  Comment: Performed at [facility], [address] [place], [place] [address]  ?RPR     Status: None  ? Collection Time: 01/19/22  6:27 PM  ?Result Value Ref Range  ? RPR Ser Ql NON REACTIVE NON REACTIVE  ?  Comment: Performed at [facility], [address] [place], [place] [address]  ?Sedimentation rate     Status: None  ? Collection Time: 01/19/22  6:27 PM  ?Result Value Ref Range  ? Sed Rate 2 0 - 22 mm/hr  ?  Comment: Performed at [facility], [address] [place], [place] [address]  ?Ceruloplasmin     Status: None  ? Collection Time: 01/19/22  6:27 PM  ?Result Value Ref Range  ? Ceruloplasmin 23.3 19.0 - 39.0 mg/dL  ?  Comment: (NOTE) ?Performed At: [facility] ?[address] [place], [place] [id number] ?Jolene Schimke MD [id number] ?  ?Heavy metals, blood     Status: None  ? Collection Time: 01/19/22  6:27 PM  ?Result Value Ref Range  ? Arsenic 1 0 - 9 ug/L  ?  Comment: (NOTE) ?This test was developed and its performance characteristics ?determined by [facility]. It has not been cleared or approved ?by the Food and Drug Administration. ?                               Detection  Limit = 1 ?  ? Mercury <1.0 0.0 - 14.9 ug/L  ?  Comment: (NOTE) ?This test was developed and its performance characteristics ?determined by [facility]. It has not been cleared or approved ?by the Food and Drug Adm

## 2022-01-20 NOTE — Group Note (Signed)
Date:  01/20/2022 ?Time:  9:58 AM ? ?Group Topic/Focus:  ?Orientation:   The focus of this group is to educate the patient on the purpose and policies of crisis stabilization and provide a format to answer questions about their admission.  The group details unit policies and expectations of patients while admitted. ? ? ? ?Participation Level:  Did Not Attend ? ?Participation Quality:   ? ?Affect:   ? ?Cognitive:   ? ?Insight:  ? ?Engagement in Group:   ? ?Modes of Intervention:   ? ?Additional Comments:   ? ?Jaquita Rector ?01/20/2022, 9:58 AM ? ?

## 2022-01-20 NOTE — Plan of Care (Signed)
Patient returned to the unit from ED and was calm and cooperative. Experiencing mild confusion but able to verbalized understanding of information provided. Received HS medications and went to bed: has been resting with no sign of distress. Safety precautions reinforced.  ?

## 2022-01-20 NOTE — Group Note (Signed)
Recreation Therapy Group Note ? ? ?Group Topic:Stress Management  ?Group Date: 01/20/2022 ?Start Time: 0930 ?End Time: [id number] ?Facilitators: Victorino Sparrow, LRT,CTRS ?Location: [place] ? ? ?Goal Area(s) Addresses:  ?Patient will actively participate in stress management techniques presented during session.  ?Patient will successfully identify benefit of practicing stress management post d/c.  ?  ?Group Description: Guided Imagery. LRT provided education, instruction, and demonstration on practice of visualization via guided imagery. Patient was asked to participate in the technique introduced during session. LRT debriefed including topics of mindfulness, stress management and specific scenarios each patient could use these techniques. Patients were given suggestions of ways to access scripts post d/c and encouraged to explore Youtube and other apps available on smartphones, tablets, and computers. ? ? ?Affect/Mood: N/A ?  ?Participation Level: Did not attend ?  ? ?Clinical Observations/Individualized Feedback:   ? ? ?Plan: Continue to engage patient in RT group sessions 2-3x/week. ? ? ?Victorino Sparrow, LRT,CTRS ?01/20/2022 11:17 AM ?

## 2022-01-20 NOTE — Group Note (Signed)
LCSW Group Therapy Note ? ?Group Date: 01/20/2022 ?Start Time: 1300 ?End Time: 1400 ? ? ?Type of Therapy and Topic:  Group Therapy: Anger Cues and Responses ? ?Participation Level:  Did Not Attend ? ? ?Description of Group:   ?In this group, patients learned how to recognize the physical, cognitive, emotional, and behavioral responses they have to anger-provoking situations.  They identified a recent time they became angry and how they reacted.  They analyzed how their reaction was possibly beneficial and how it was possibly unhelpful.  The group discussed a variety of healthier coping skills that could help with such a situation in the future.  Focus was placed on how helpful it is to recognize the underlying emotions to our anger, because working on those can lead to a more permanent solution as well as our ability to focus on the important rather than the urgent. ? ?Therapeutic Goals: ?Patients will remember their last incident of anger and how they felt emotionally and physically, what their thoughts were at the time, and how they behaved. ?Patients will identify how their behavior at that time worked for them, as well as how it worked against them. ?Patients will explore possible new behaviors to use in future anger situations. ?Patients will learn that anger itself is normal and cannot be eliminated, and that healthier reactions can assist with resolving conflict rather than worsening situations. ? ?Summary of Patient Progress:  Did not attend ? ?Therapeutic Modalities:   ?Cognitive Behavioral Therapy ? ? ? ?Elener Custodio E Robynn Marcel, LCSWA ?01/20/2022  1:58 PM   ? ?

## 2022-01-20 NOTE — Plan of Care (Signed)
I spoke again with Dr. Amada Jupiter with neurology on 01/20/22 and he reviewed the MRI findings. 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 Given her TBI history and reported seizure, he recommends we start patient on Lamictal standard titration for seizure prophylaxis. If she has any additional seizures, she may require start of keppra while Lamictal becomes therapeutic and we would need to reconsult neurology. Outpatient neurology f/u recommended after discharge. EEG was normal yesterday.  ? ?Bartholomew Crews, MD, FAPA ?

## 2022-01-20 NOTE — Progress Notes (Addendum)
[facility] MD Progress Note ? ?01/21/2022 7:22 AM ?Barbara Gordon  ?MRN:  [id number] ? ?Chief Complaint: paranoia/psychosis ? ?Reason for Admission:  ?Barbara Gordon is a 31 y.o. female with a history of TBI [facility], epidural hematoma, and temporal skull fracture in 2021), previous polysubstance abuse, previous diagnoses of bipolar d/o vs substance induced mood d/o vs schizoaffective d/o, previous suicide attempt via OD, and previous treatment of psychosis, who was initially admitted under IVC on 01/18/2022 for management of self-care deficits, bizarre behaviors, and paranoia. The patient is currently on Hospital Day 3.  ? ?Chart Review from last 24 hours:  ?The patient's chart was reviewed and nursing notes were reviewed. The patient's case was discussed in multidisciplinary team meeting.  Per nursing she did not attend group yesterday and was brief in her interactions and paranoid appearing.  She had no acute behavioral issues or safety concerns noted.  Per [facility] she was compliant with scheduled medications and did not require any PRNs. ? ?Information Obtained Today During Patient Interview: ?The patient was seen and evaluated on the unit. On assessment today the patient is sleeping and is reluctant to engage in an interview.  She does wake up and states that she showered yesterday and has been going some to the cafeteria.  When asked as to why she has not been attending groups she states "I do not know."  She states her appetite is "all right" and her sleep has been good.  She cannot explain why she is in the hospital and has very little recall as to events prior to admission.  She states that she had been staying with a friend and the friend called the police due to concerns about her, but she is unsure what those concerns were.  She denies any AVH, paranoia, ideas of reference or first rank symptoms but does appear guarded with poverty of thought on exam.  She again denies drug use prior to admission.  She voices no physical  complaints and specifically denies issues with dysuria, urinary frequency or urgency.  It was discussed that based on her UA findings we will get a urine culture and empirically start her on an antibiotic for presumed UTI.  She denies nausea, vomiting, diarrhea, chest pain or shortness of breath and denies any side effects with her medications.  She was encouraged to be out of her room today attending groups and more visible in the milieu and room lockout was discussed. ? ?Principal Problem: 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[place]) ?Diagnosis: Principal Problem: ?  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[place]) ?Active Problems: ?  History of seizure ?  Cannabis abuse ? ?Total Time Spent in Direct Patient Care:  ?I personally spent 30 minutes on the unit in direct patient care. The direct patient care time included face-to-face time with the patient, reviewing the patient's chart, communicating with other professionals, and coordinating care. Greater than 50% of this time was spent in counseling or coordinating care with the patient regarding goals of hospitalization, psycho-education, and discharge planning needs. ? ?Past Psychiatric History: see H&P ? ?Past Medical History: see H&P ? ?Family History: see H&P ? ?Family Psychiatric  History: see H&P ? ?Social History:  ?Social History  ? ?Substance and Sexual Activity  ?Alcohol Use None  ?   ?Social History  ? ?Substance and Sexual Activity  ?Drug Use Never  ?  ?Social History  ? ?Socioeconomic History  ? Marital status: Single  ?  Spouse name: Not on file  ? Number of children: Not on file  ? Years of education: Not on file  ? Highest education level: Not on file  ?Occupational History  ? Not on file  ?Tobacco Use  ? Smoking status: Some Days  ?  Types: Cigarettes  ? Smokeless tobacco: Never  ?Substance and Sexual Activity  ? Alcohol use: Not on file  ? Drug use: Never  ? Sexual  activity: Never  ?Other Topics Concern  ? Not on file  ?Social History Narrative  ? Not on file  ? ?Social Determinants of Health  ? ?Financial Resource Strain: Not on file  ?Food Insecurity: Not on file  ?Transportation Needs: Not on file  ?Physical Activity: Not on file  ?Stress: Not on file  ?Social Connections: Not on file  ? ?Sleep: Good ? ?Appetite:  Fair ? ?Current Medications: ?Current Facility-Administered Medications  ?Medication Dose Route Frequency Provider Last Rate Last Admin  ? acetaminophen (TYLENOL) tablet 650 mg  650 mg Oral Q6H PRN Revonda Humphrey, NP      ? alum & mag hydroxide-simeth (MAALOX/MYLANTA) 200-200-20 MG/5ML suspension 30 mL  30 mL Oral Q4H PRN Revonda Humphrey, NP      ? feeding supplement (ENSURE ENLIVE / ENSURE PLUS) liquid 237 mL  237 mL Oral BID BM Massengill, Ovid Curd, MD   237 mL at 01/20/22 1358  ? hydrOXYzine (ATARAX) tablet 25 mg  25 mg Oral Q6H PRN Harlow Asa, MD      ? lamoTRIgine (LAMICTAL) tablet 25 mg  25 mg Oral Daily Harlow Asa, MD   25 mg at 01/20/22 [id number]  ? loperamide (IMODIUM) capsule 2-4 mg  2-4 mg Oral PRN Harlow Asa, MD      ? OLANZapine zydis (ZYPREXA) disintegrating tablet 10 mg  10 mg Oral Q8H PRN Revonda Humphrey, NP      ? And  ? LORazepam (ATIVAN) tablet 1 mg  1 mg Oral PRN Revonda Humphrey, NP      ? And  ? ziprasidone (GEODON) injection 20 mg  20 mg Intramuscular PRN Revonda Humphrey, NP      ? LORazepam (ATIVAN) tablet 1 mg  1 mg Oral Q6H PRN Nelda Marseille, Javien Tesch E, MD      ? magnesium hydroxide (MILK OF MAGNESIA) suspension 30 mL  30 mL Oral Daily PRN Revonda Humphrey, NP      ? multivitamin with minerals tablet 1 tablet  1 tablet Oral Daily Harlow Asa, MD   1 tablet at 01/20/22 0756  ? nicotine polacrilex (NICORETTE) gum 2 mg  2 mg Oral PRN Janine Limbo, MD   2 mg at 01/19/22 1514  ? OLANZapine zydis (ZYPREXA) disintegrating tablet 10 mg  10 mg Oral QHS Revonda Humphrey, NP   10 mg at 01/20/22 2213  ? OLANZapine  zydis (ZYPREXA) disintegrating tablet 5 mg  5 mg Oral Daily Viann Fish E, MD   5 mg at 01/20/22 0756  ? ondansetron (ZOFRAN-ODT) disintegrating tablet 4 mg  4 mg Oral Q6H PRN Viann Fish E, MD      ? thiamine tablet 100 mg  100 mg Oral Daily Nelda Marseille, Blanchie Zeleznik E, MD   100 mg at 01/20/22 0756  ? traZODone (DESYREL) tablet 50 mg  50 mg Oral QHS PRN Revonda Humphrey, NP      ? ? ?Lab Results:  ?Results for orders placed or performed during the hospital encounter of 01/18/22 (from the  past 48 hour(s))  ?HIV Antibody (routine testing w rflx)     Status: None  ? Collection Time: 01/19/22  6:27 PM  ?Result Value Ref Range  ? HIV Screen 4th Generation wRfx Non Reactive Non Reactive  ?  Comment: Performed at [facility], [place] [address] [place], [place] [address]  ?RPR     Status: None  ? Collection Time: 01/19/22  6:27 PM  ?Result Value Ref Range  ? RPR Ser Ql NON REACTIVE NON REACTIVE  ?  Comment: Performed at [facility], [place] [address], [place], [place] [address]  ?Sedimentation rate     Status: None  ? Collection Time: 01/19/22  6:27 PM  ?Result Value Ref Range  ? Sed Rate 2 0 - 22 mm/hr  ?  Comment: Performed at [facility], [place] [address], [place], [place] [address]  ?Ceruloplasmin     Status: None  ? Collection Time: 01/19/22  6:27 PM  ?Result Value Ref Range  ? Ceruloplasmin 23.3 19.0 - 39.0 mg/dL  ?  Comment: (NOTE) ?Performed At: [facility] ?[address] [place], [place] [id number] ?Rush Farmer MD [id number] ?  ?Heavy metals, blood     Status: None  ? Collection Time: 01/19/22  6:27 PM  ?Result Value Ref Range  ? Arsenic 1 0 - 9 ug/L  ?  Comment: (NOTE) ?This test was developed and its performance characteristics ?determined by [facility]. It has not been cleared or approved ?by the Food and Drug Administration. ?                               Detection Limit = 1 ?  ? Mercury <1.0 0.0 - 14.9 ug/L  ?  Comment: (NOTE) ?This test was developed and its performance  characteristics ?determined by [facility]. It has not been cleared or approved ?by the Food and Drug Administration. ?                       Environmental Exposure:  <15.0 ?                       Occupational

## 2022-01-20 NOTE — BH IP Treatment Plan (Signed)
Interdisciplinary Treatment and Diagnostic Plan Update ? ?01/20/2022 ?Time of Session: 9:35am  ?Barbara Gordon ?MRN: [id number] ? 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 ?Secondary Diagnoses: Principal Problem: ?  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  History of seizure ?  Cannabis abuse ? ? ?Current Medications:  ?Current Facility-Administered Medications  ?Medication Dose Route Frequency Provider Last Rate Last Admin  ? acetaminophen (TYLENOL) tablet 650 mg  650 mg Oral Q6H PRN Revonda Humphrey, NP      ? alum & mag hydroxide-simeth (MAALOX/MYLANTA) 200-200-20 MG/5ML suspension 30 mL  30 mL Oral Q4H PRN Revonda Humphrey, NP      ? feeding supplement (ENSURE ENLIVE / ENSURE PLUS) liquid 237 mL  237 mL Oral BID BM Massengill, Ovid Curd, MD   237 mL at 01/20/22 1358  ? hydrOXYzine (ATARAX) tablet 25 mg  25 mg Oral Q6H PRN Harlow Asa, MD      ? lamoTRIgine (LAMICTAL) tablet 25 mg  25 mg Oral Daily Harlow Asa, MD   25 mg at 01/20/22 1194  ? loperamide (IMODIUM) capsule 2-4 mg  2-4 mg Oral PRN Harlow Asa, MD      ? OLANZapine zydis (ZYPREXA) disintegrating tablet 10 mg  10 mg Oral Q8H PRN Revonda Humphrey, NP      ? And  ? LORazepam (ATIVAN) tablet 1 mg  1 mg Oral PRN Revonda Humphrey, NP      ? And  ? ziprasidone (GEODON) injection 20 mg  20 mg Intramuscular PRN Revonda Humphrey, NP      ? LORazepam (ATIVAN) tablet 1 mg  1 mg Oral Q6H PRN Nelda Marseille, Amy E, MD      ? magnesium hydroxide (MILK OF MAGNESIA) suspension 30 mL  30 mL Oral Daily PRN Revonda Humphrey, NP      ? multivitamin with minerals tablet 1 tablet  1 tablet Oral Daily Harlow Asa, MD   1 tablet at 01/20/22 0756  ? nicotine polacrilex (NICORETTE) gum 2 mg  2 mg Oral PRN Janine Limbo, MD   2 mg at 01/19/22 1514  ? OLANZapine zydis (ZYPREXA) disintegrating tablet 10 mg  10 mg Oral QHS  Revonda Humphrey, NP   10 mg at 01/19/22 2116  ? OLANZapine zydis (ZYPREXA) disintegrating tablet 5 mg  5 mg Oral Daily Viann Fish E, MD   5 mg at 01/20/22 0756  ? ondansetron (ZOFRAN-ODT) disintegrating tablet 4 mg  4 mg Oral Q6H PRN Viann Fish E, MD      ? thiamine tablet 100 mg  100 mg Oral Daily Nelda Marseille, Amy E, MD   100 mg at 01/20/22 0756  ? traZODone (DESYREL) tablet 50 mg  50 mg Oral QHS PRN Revonda Humphrey, NP      ? ?PTA Medications: ?Medications Prior to Admission  ?Medication Sig Dispense Refill Last Dose  ? OLANZapine (ZYPREXA) 5 MG tablet Take 1 tablet (5 mg total) by mouth daily.     ? OLANZapine zydis (ZYPREXA) 10 MG disintegrating tablet Take 1 tablet (10 mg total) by mouth at bedtime.     ? ? ?Patient Stressors:   ? ?Patient Strengths:   ? ?Treatment Modalities: Medication Management, Group therapy, Case management,  ?1 to 1 session with clinician, Psychoeducation, Recreational therapy. ? ? 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  Long Term Goal(s): Improvement in symptoms so as ready for discharge  ? ?Short Term Goals: Ability to identify changes in lifestyle to reduce recurrence of condition will improve ?Ability to verbalize feelings will improve ?Ability to disclose and discuss suicidal ideas ?Ability to demonstrate self-control will improve ?Ability to identify and develop effective coping behaviors will improve ?Ability to maintain clinical measurements within normal limits will improve ?Compliance with prescribed medications will improve ?Ability to identify triggers associated with substance abuse/mental health issues will improve ? ?Medication Management: Evaluate patient's response, side effects, and tolerance of medication regimen. ? ?Therapeutic Interventions: 1 to 1 sessions, Unit Group sessions and Medication administration. ? ?Evaluation of Outcomes: Not Met ? ?Physician  Treatment Plan for Secondary Diagnosis: Principal Problem: ?  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  History of seizure ?  Cannabis abuse ? ?Long Term Goal(s): Improvement in symptoms so as ready for discharge  ? ?Short Term Goals: Ability to identify changes in lifestyle to reduce recurrence of condition will improve ?Ability to verbalize feelings will improve ?Ability to disclose and discuss suicidal ideas ?Ability to demonstrate self-control will improve ?Ability to identify and develop effective coping behaviors will improve ?Ability to maintain clinical measurements within normal limits will improve ?Compliance with prescribed medications will improve ?Ability to identify triggers associated with substance abuse/mental health issues will improve    ? ?Medication Management: Evaluate patient's response, side effects, and tolerance of medication regimen. ? ?Therapeutic Interventions: 1 to 1 sessions, Unit Group sessions and Medication administration. ? ?Evaluation of Outcomes: Not Met ? ? 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 ?Short Term Goals: Ability to remain free from injury will improve, Ability to participate in decision making will improve, Ability to verbalize feelings will improve, Ability to disclose and discuss suicidal ideas, and Ability to identify and develop effective coping behaviors will improve ? ?Medication Management: RN will administer medications as ordered by provider, will assess and evaluate patient's response and provide education to patient for prescribed medication. RN will report any adverse and/or side effects to prescribing provider. ? ?Therapeutic Interventions: 1 on 1 counseling sessions, Psychoeducation, Medication  administration, Evaluate responses to treatment, Monitor vital signs and CBGs as ordered, Perform/monitor CIWA, COWS, AIMS and Fall Risk screenings as ordered, Perform wound care treatments as ordered. ? ?Evaluation of Outcomes: Not Met ? ? 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 ? ?Short Term Goals: Engage patient in aftercare planning with referrals and resources, Increase social support, Increase emotional regulation, Facilitate acceptance of mental health diagnosis and concerns, Identify triggers associated with mental health/substance abuse issues, and Increase skills for wellness and recovery ? ?Therapeutic Interventions: Assess for all discharge needs, 1 to 1 time with Education officer, museum, Explore available resources and support systems, Assess for adequacy in community support network, Educate family and significant other(s) on suicide prevention, Complete Psychosocial Assessment, Interpersonal group therapy. ? ?Evaluation of Outcomes: Not Met ? ? ?Progress in Treatment: ?Attending groups: Yes. ?Participating in groups: Yes. ?Taking medication as prescribed: Yes. ?Toleration medication: Yes. ?Family/Significant other contact made: Yes, individual(s) contacted:  Friend  ?Patient understands diagnosis: Yes. ?Discussing patient identified problems/goals  with staff: Yes. ?Medical problems stabilized or resolved: Yes. ?Denies suicidal/homicidal ideation: Yes. ?Issues/concerns per patient self-inventory: No. ? ? ?New problem(s) identified: No, Describe:  None  ? ?New Short Term/Long Term Goal(s): medication stabilization, elimination of SI thoughts, development of comprehensive mental wellness plan.  ? ?Patient Goals: "To go home"  ? ?Discharge Plan or Barriers: Patient recently  admitted. CSW will continue to follow and assess for appropriate referrals and possible discharge planning.  ? ?Reason for Continuation of Hospitalization: Anxiety ?Depression ?Hallucinations ?Medication stabilization

## 2022-01-20 NOTE — Progress Notes (Signed)
?   01/20/22 1000  ?Psych Admission Type (Psych Patients Only)  ?Admission Status Involuntary  ?Psychosocial Assessment  ?Patient Complaints None  ?Eye Contact Brief  ?Facial Expression Blank;Flat  ?Affect Blunted  ?Speech UTA  ?Interaction Avoidant;Minimal  ?Motor Activity Slow  ?Appearance/Hygiene Unremarkable  ?Behavior Characteristics Unwilling to participate  ?Mood Depressed;Anxious  ?Thought Process  ?Coherency Unable to assess  ?Content UTA  ?Delusions UTA  ?Perception UTA  ?Hallucination UTA  ?Judgment Limited  ?Confusion Mild  ?Danger to Self  ?Current suicidal ideation? Denies  ?Danger to Others  ?Danger to Others None reported or observed  ? ? ?

## 2022-01-21 DIAGNOSIS — F29 Unspecified psychosis not due to a substance or known physiological condition: Secondary | ICD-10-CM | POA: Diagnosis not present

## 2022-01-21 LAB — VITAMIN D 25 HYDROXY (VIT D DEFICIENCY, FRACTURES): Vit D, 25-Hydroxy: 22.48 ng/mL — ABNORMAL LOW (ref 30–100)

## 2022-01-21 LAB — VITAMIN B1: Vitamin B1 (Thiamine): 185 nmol/L (ref 66.5–200.0)

## 2022-01-21 MED ORDER — NITROFURANTOIN MONOHYD MACRO 100 MG PO CAPS
100.0000 mg | ORAL_CAPSULE | Freq: Two times a day (BID) | ORAL | Status: AC
  Administered 2022-01-21 – 2022-01-25 (×10): 100 mg via ORAL
  Filled 2022-01-21 (×12): qty 1

## 2022-01-21 MED ORDER — BENZTROPINE MESYLATE 0.5 MG PO TABS
0.5000 mg | ORAL_TABLET | Freq: Two times a day (BID) | ORAL | Status: DC | PRN
  Administered 2022-01-28: 0.5 mg via ORAL
  Filled 2022-01-21 (×2): qty 10
  Filled 2022-01-21: qty 1

## 2022-01-21 MED ORDER — OLANZAPINE 15 MG PO TBDP
15.0000 mg | ORAL_TABLET | Freq: Every day | ORAL | Status: DC
Start: 2022-01-21 — End: 2022-01-24
  Administered 2022-01-21 – 2022-01-23 (×3): 15 mg via ORAL
  Filled 2022-01-21 (×5): qty 1

## 2022-01-21 NOTE — Progress Notes (Signed)
Patient ID: Barbara Gordon, female   DOB: 1991/10/26, 31 y.o.   MRN: [id number] ? ? ?Pt observed anxious in hallway and medication window. Pt offered medication. Atarax 25 mg, PRN given. ?

## 2022-01-21 NOTE — Progress Notes (Signed)
Adult Psychoeducational Group Note ? ?Date:  01/21/2022 ?Time:  9:27 PM ? ?Group Topic/Focus:  ?Wrap-Up Group:   The focus of this group is to help patients review their daily goal of treatment and discuss progress on daily workbooks. ? ?Participation Level:  Active ? ?Participation Quality:  Appropriate ? ?Affect:  Appropriate ? ?Cognitive:  Appropriate ? ?Insight: Appropriate ? ?Engagement in Group:  Engaged ? ?Modes of Intervention:  Discussion ? ?Additional Comments:  attend wrap up group ? ?Charna Busman Long ?01/21/2022, 9:27 PM ?

## 2022-01-21 NOTE — Plan of Care (Signed)
  Problem: Education: Goal: Emotional status will improve Outcome: Not Progressing Goal: Mental status will improve Outcome: Not Progressing Goal: Verbalization of understanding the information provided will improve Outcome: Not Progressing   

## 2022-01-21 NOTE — Progress Notes (Signed)
?   01/21/22 1056  ?Psych Admission Type (Psych Patients Only)  ?Admission Status Involuntary  ?Psychosocial Assessment  ?Patient Complaints None  ?Eye Contact Brief  ?Facial Expression Flat;Blank  ?Affect Blunted  ?Speech Soft  ?Interaction No initiation;Minimal;Avoidant  ?Motor Activity Other (Comment) ?(WNL)  ?Appearance/Hygiene Unremarkable  ?Behavior Characteristics Cooperative  ?Mood Anxious;Preoccupied  ?Thought Process  ?Coherency Blocking  ?Content Paranoia  ?Delusions Paranoid  ?Perception WDL  ?Hallucination None reported or observed  ?Judgment Impaired  ?Confusion Mild  ?Danger to Self  ?Current suicidal ideation? Denies  ?Danger to Others  ?Danger to Others None reported or observed  ? ? ?

## 2022-01-21 NOTE — Progress Notes (Signed)
? ? ? ?   01/20/22 2213  ?Psych Admission Type (Psych Patients Only)  ?Admission Status Involuntary  ?Psychosocial Assessment  ?Patient Complaints None  ?Eye Contact Brief  ?Facial Expression Blank;Flat  ?Affect Blunted  ?Speech Soft  ?Interaction Avoidant;Isolative;Minimal  ?Motor Activity Slow  ?Appearance/Hygiene Unremarkable  ?Behavior Characteristics Cooperative  ?Mood Depressed;Anxious  ?Thought Process  ?Coherency Unable to assess  ?Content Paranoia  ?Delusions Paranoid  ?Perception WDL  ?Hallucination None reported or observed  ?Judgment Limited  ?Confusion Mild  ?Danger to Self  ?Current suicidal ideation? Denies  ?Danger to Others  ?Danger to Others None reported or observed  ? ? ?

## 2022-01-21 NOTE — Plan of Care (Signed)
?  Problem: Activity: ?Goal: Interest or engagement in activities will improve ?Outcome: Progressing ?  ?Problem: Coping: ?Goal: Ability to demonstrate self-control will improve ?Outcome: Progressing ?  ?Problem: Safety: ?Goal: Periods of time without injury will increase ?Outcome: Progressing ?  ?Problem: Education: ?Goal: Knowledge of the prescribed therapeutic regimen will improve ?Outcome: Progressing ?  ?

## 2022-01-22 DIAGNOSIS — F29 Unspecified psychosis not due to a substance or known physiological condition: Secondary | ICD-10-CM | POA: Diagnosis not present

## 2022-01-22 MED ORDER — ONDANSETRON 4 MG PO TBDP: 4.0000 mg | ORAL_TABLET | Freq: Four times a day (QID) | ORAL | Status: AC | PRN

## 2022-01-22 MED ORDER — LOPERAMIDE HCL 2 MG PO CAPS: 2.0000 mg | ORAL_CAPSULE | ORAL | Status: AC | PRN

## 2022-01-22 MED ORDER — LORAZEPAM 1 MG PO TABS: 1.0000 mg | ORAL_TABLET | Freq: Four times a day (QID) | ORAL | Status: AC | PRN

## 2022-01-22 MED ORDER — HYDROXYZINE HCL 25 MG PO TABS
25.0000 mg | ORAL_TABLET | Freq: Four times a day (QID) | ORAL | Status: AC | PRN
  Administered 2022-01-24 – 2022-01-25 (×2): 25 mg via ORAL
  Filled 2022-01-22 (×3): qty 1

## 2022-01-22 MED ORDER — VITAMIN D (ERGOCALCIFEROL) 1.25 MG (50000 UNIT) PO CAPS
50000.0000 [IU] | ORAL_CAPSULE | ORAL | Status: DC
  Administered 2022-01-22 – 2022-01-29 (×2): 50000 [IU] via ORAL
  Filled 2022-01-22 (×3): qty 1

## 2022-01-22 NOTE — Group Note (Signed)
LCSW Group Therapy Note ? ? ?Group Date: 01/22/2022 ?Start Time: 1300 ?End Time: 1400 ? ?Type of Therapy and Topic:  Group Therapy:  Healthy and Unhealthy Supports ? ?Participation Level:  Did not attend   ? ?Description of Group:  Patients in this group were introduced to the idea of adding a variety of healthy supports to address the various needs in their lives, especially in reference to their plans and focus for the new year.  Patients discussed what additional healthy supports could be helpful in their recovery and wellness after discharge in order to prevent future hospitalizations.   An emphasis was placed on using counselor, doctor, therapy groups, 12-step groups, and problem-specific support groups to expand supports.   ? ?Therapeutic Goals: ? ? 1)  discuss importance of adding supports to stay well once out of the hospital ? 2)  compare healthy versus unhealthy supports and identify some examples of each ? 3)  generate ideas and descriptions of healthy supports that can be added ? 4)  offer mutual support about how to address unhealthy supports ? 5)  encourage active participation in and adherence to discharge plan ?  ? ?Summary of Patient Progress:  Did not attend   ? ?Barbara Gordon M Barbara Gordon, LCSWA ?01/22/2022  2:00 PM   ? ?

## 2022-01-22 NOTE — Group Note (Unsigned)
Date:  01/22/2022 ?Time:  9:48 AM ? ?Group Topic/Focus:  ?Orientation:   The focus of this group is to educate the patient on the purpose and policies of crisis stabilization and provide a format to answer questions about their admission.  The group details unit policies and expectations of patients while admitted. ? ? ? ? ?Participation Level:  {BHH PARTICIPATION LEVEL:22264} ? ?Participation Quality:  {BHH PARTICIPATION QUALITY:22265} ? ?Affect:  {BHH AFFECT:22266} ? ?Cognitive:  {BHH COGNITIVE:22267} ? ?Insight: {BHH Insight2:20797} ? ?Engagement in Group:  {BHH ENGAGEMENT IN GROUP:22268} ? ?Modes of Intervention:  {BHH MODES OF INTERVENTION:22269} ? ?Additional Comments:  *** ? ?Barbara Gordon ?01/22/2022, 9:48 AM ? ?

## 2022-01-22 NOTE — Progress Notes (Signed)
Patient ID: Barbara Gordon, female   DOB: [birth year], 31 y.o.   MRN: [id number] ? ? ?Pt observed in day with increased anxiety after morning/goals group. Medication offered. Atarax 25 mg PRN given. ?

## 2022-01-22 NOTE — Group Note (Signed)
Recreation Therapy Group Note ? ? ?Group Topic:Stress Management  ?Group Date: 01/22/2022 ?Start Time: 27 ?End Time: 0950 ?Facilitators: Caroll Rancher, LRT,CTRS ?Location: [address] Dayroom ? ? ?Goal Area(s) Addresses:  ?Patient will identify positive stress management techniques. ?Patient will identify benefits of using stress management post d/c. ? ?Group Description:  Meditation.  LRT played a meditation that focused on having gratitude for what you have and what you have to offer to the world instead of focusing on the things you lack.  Patients were to listen and follow along as meditation played to engage in the activity. ? ? ?Affect/Mood: Appropriate ?  ?Participation Level: Active ?  ?Participation Quality: Independent ?  ?Behavior: Attentive  ?  ?Speech/Thought Process: Focused ?  ?Insight: Good ?  ?Judgement: Good ?  ?Modes of Intervention: Meditation ?  ?Patient Response to Interventions:  Engaged ?  ?Education Outcome: ? Acknowledges education and In group clarification offered   ? ?Clinical Observations/Individualized Feedback: Pt attended and participated in group session.  ?  ? ?Plan: Continue to engage patient in RT group sessions 2-3x/week. ? ? ?Caroll Rancher, LRT,CTRS  ?01/22/2022 10:29 AM ?

## 2022-01-22 NOTE — Group Note (Signed)
Date:  01/22/2022 ?Time:  9:59 AM ? ?Group Topic/Focus:  ?Orientation:   The focus of this group is to educate the patient on the purpose and policies of crisis stabilization and provide a format to answer questions about their admission.  The group details unit policies and expectations of patients while admitted. ? ? ? ?Participation Level:  Did Not Attend ? ?Participation Quality:   ? ?Affect:   ? ?Cognitive:   ?Insight:  ? ?Engagement in Group:   ? ?Modes of Intervention:   ? ?Additional Comments:   ? ?Jaquita Rector ?01/22/2022, 9:59 AM ? ?

## 2022-01-22 NOTE — Plan of Care (Signed)
?  Problem: Activity: ?Goal: Interest or engagement in activities will improve ?Outcome: Progressing ?  ?Problem: Coping: ?Goal: Ability to demonstrate self-control will improve ?Outcome: Progressing ?  ?Problem: Safety: ?Goal: Periods of time without injury will increase ?Outcome: Progressing ?  ?Problem: Nutritional: ?Goal: Ability to achieve adequate nutritional intake will improve ?Outcome: Progressing ?  ?

## 2022-01-22 NOTE — Progress Notes (Signed)
Patient ID: Barbara Gordon, female   DOB: [birth year], 31 y.o.   MRN: [id number] ? ? ?Urine specimen cup given to pt for urine culture. ?

## 2022-01-22 NOTE — Progress Notes (Addendum)
?   01/21/22 2022  ?Psych Admission Type (Psych Patients Only)  ?Admission Status Involuntary  ?Psychosocial Assessment  ?Patient Complaints None  ?Eye Contact Brief  ?Facial Expression Flat  ?Affect Blunted  ?Speech Soft  ?Interaction Minimal;Avoidant  ?Motor Activity Other (Comment) ?(wnl)  ?Appearance/Hygiene Unremarkable  ?Behavior Characteristics Cooperative  ?Mood Preoccupied  ?Thought Process  ?Coherency Blocking  ?Content WDL  ?Delusions None reported or observed  ?Perception WDL  ?Hallucination None reported or observed  ?Judgment Impaired  ?Confusion Mild  ?Danger to Self  ?Current suicidal ideation? Denies  ?Danger to Others  ?Danger to Others None reported or observed  ? ?Pt seen at nurse's station. Pt denies SI, HI, AVH and pain. Pt denies anxiety and depression. Pt appears to be thought blocking. States she is eating and sleeping well. Said she attended groups today. Pt minimal and stays in her room most of the time. Pt denies any withdrawal symptoms. ?

## 2022-01-22 NOTE — Progress Notes (Addendum)
[facility] MD Progress Note ? ?01/22/2022 10:56 AM ?Barbara Gordon  ?MRN:  [id number] ? ?Chief Complaint: paranoia/psychosis ? ?Reason for Admission:  ?Barbara Gordon is a 31 y.o. female with a history of TBI [facility], epidural hematoma, and temporal skull fracture in 2021), previous polysubstance abuse, previous diagnoses of bipolar d/o vs substance induced mood d/o vs schizoaffective d/o, previous suicide attempt via OD, and previous treatment of psychosis, who was initially admitted under IVC on 01/18/2022 for management of self-care deficits, bizarre behaviors, and paranoia. The patient is currently on Hospital Day 4.  ? ?Chart Review from last 24 hours:  ?The patient's chart was reviewed and nursing notes were reviewed. The patient's case was discussed in multidisciplinary team meeting.  Per nursing she did attend groups with prompts but had evidence of anxiety and thought blocking in interactions. She had no acute behavioral issues noted. She continued to have minimal interactions. Per MAR she was compliant with scheduled medications and did receive Vistaril X1 for anxiety. ? ?Information Obtained Today During Patient Interview: ?The patient was seen and evaluated on the unit.  She is fixated today on the fact that she is on room lockout for meals and groups.  She was seen attending group this morning but immediately wants to go back to bed after group is over and admits that she did not eat in the cafeteria nor eat any breakfast this morning.  She states that she has not showered and was encouraged to attend to her ADLs.  She states that she did eat meals yesterday.  When questioned again as to the reason for admission she states that she is in the hospital for "a bladder infection" and does not have any recall of the events that led up to hospitalization.  She again states she was not using any drugs or alcohol prior to admission but when confronted with the fact that her urine drug screen showed marijuana, she states  "well, I must have been smoking."  She states her mood is "all right" and that she is feeling a little tired today.  She states that she is sleeping well.  She denies medication side effects and voices no physical complaints.  She denies ideas of reference, paranoia, first rank symptoms, AVH, SI or HI; however, she has residual paranoid behaviors with intermittent thought blocking, and is guarded on exam.  With orientation questions, she does not know the month, year, president and can explain events that occurred on 9 /09/2000. ? ?Principal Problem: 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HCC) ?Diagnosis: Principal Problem: ?  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HCC) ?Active Problems: ?  History of seizure ?  Cannabis abuse ? ?Total Time Spent in Direct Patient Care:  ?I personally spent 30 minutes on the unit in direct patient care. The direct patient care time included face-to-face time with the patient, reviewing the patient's chart, communicating with other professionals, and coordinating care. Greater than 50% of this time was spent in counseling or coordinating care with the patient regarding goals of hospitalization, psycho-education, and discharge planning needs. ? ?Past Psychiatric History: see H&P ? ?Past Medical History: see H&P ? ?Family History: see H&P ? ?Family Psychiatric  History: see H&P ? ?Social History:  ?Social History  ? ?Substance and Sexual Activity  ?Alcohol Use None  ?   ?Social History  ? ?Substance and Sexual Activity  ?Drug Use Never  ?  ?Social History  ? ?Socioeconomic History  ?  Marital status: Single  ?  Spouse name: Not on file  ? Number of children: Not on file  ? Years of education: Not on file  ? Highest education level: Not on file  ?Occupational History  ? Not on file  ?Tobacco Use  ? Smoking status: Some Days  ?  Types: Cigarettes  ? Smokeless tobacco: Never  ?Substance and Sexual Activity   ? Alcohol use: Not on file  ? Drug use: Never  ? Sexual activity: Never  ?Other Topics Concern  ? Not on file  ?Social History Narrative  ? Not on file  ? ?Social Determinants of Health  ? ?Financial Resource Strain: Not on file  ?Food Insecurity: Not on file  ?Transportation Needs: Not on file  ?Physical Activity: Not on file  ?Stress: Not on file  ?Social Connections: Not on file  ? ?Sleep: Good ? ?Appetite:  Fair ? ?Current Medications: ?Current Facility-Administered Medications  ?Medication Dose Route Frequency Provider Last Rate Last Admin  ? acetaminophen (TYLENOL) tablet 650 mg  650 mg Oral Q6H PRN Ardis Hughs, NP      ? alum & mag hydroxide-simeth (MAALOX/MYLANTA) 200-200-20 MG/5ML suspension 30 mL  30 mL Oral Q4H PRN Ardis Hughs, NP      ? benztropine (COGENTIN) tablet 0.5 mg  0.5 mg Oral BID PRN Comer Locket, MD      ? feeding supplement (ENSURE ENLIVE / ENSURE PLUS) liquid 237 mL  237 mL Oral BID BM Massengill, Harrold Donath, MD   237 mL at 01/22/22 1610  ? hydrOXYzine (ATARAX) tablet 25 mg  25 mg Oral Q6H PRN Comer Locket, MD   25 mg at 01/22/22 9604  ? lamoTRIgine (LAMICTAL) tablet 25 mg  25 mg Oral Daily Comer Locket, MD   25 mg at 01/22/22 0743  ? loperamide (IMODIUM) capsule 2-4 mg  2-4 mg Oral PRN Comer Locket, MD      ? OLANZapine zydis (ZYPREXA) disintegrating tablet 10 mg  10 mg Oral Q8H PRN Ardis Hughs, NP      ? And  ? LORazepam (ATIVAN) tablet 1 mg  1 mg Oral PRN Ardis Hughs, NP      ? And  ? ziprasidone (GEODON) injection 20 mg  20 mg Intramuscular PRN Ardis Hughs, NP      ? LORazepam (ATIVAN) tablet 1 mg  1 mg Oral Q6H PRN Mason Jim, Faustina Gebert E, MD      ? magnesium hydroxide (MILK OF MAGNESIA) suspension 30 mL  30 mL Oral Daily PRN Ardis Hughs, NP      ? multivitamin with minerals tablet 1 tablet  1 tablet Oral Daily Comer Locket, MD   1 tablet at 01/22/22 5409  ? nicotine polacrilex (NICORETTE) gum 2 mg  2 mg Oral PRN Phineas Inches, MD    2 mg at 01/22/22 8119  ? nitrofurantoin (macrocrystal-monohydrate) (MACROBID) capsule 100 mg  100 mg Oral Q12H Mason Jim, Dane Bloch E, MD   100 mg at 01/22/22 0746  ? OLANZapine zydis (ZYPREXA) disintegrating tablet 15 mg  15 mg Oral QHS Comer Locket, MD   15 mg at 01/21/22 2026  ? OLANZapine zydis (ZYPREXA) disintegrating tablet 5 mg  5 mg Oral Daily Comer Locket, MD   5 mg at 01/22/22 0743  ? ondansetron (ZOFRAN-ODT) disintegrating tablet 4 mg  4 mg Oral Q6H PRN Mason Jim, Earlee Herald E, MD      ? thiamine tablet 100 mg  100 mg  Oral Daily Comer LocketSingleton, Toi Stelly E, MD   100 mg at 01/22/22 [id number]  ? traZODone (DESYREL) tablet 50 mg  50 mg Oral QHS PRN Ardis Hughsoleman, Carolyn H, NP      ? Vitamin D (Ergocalciferol) (DRISDOL) capsule 50,000 Units  50,000 Units Oral Q7 days Comer LocketSingleton, Kenzly Rogoff E, MD   50,000 Units at 01/22/22 [id number]  ? ? ?Lab Results:  ?Results for orders placed or performed during the hospital encounter of 01/18/22 (from the past 48 hour(s))  ?Urinalysis, Routine w reflex microscopic Urine, Clean Catch     Status: Abnormal  ? Collection Time: 01/20/22  4:08 PM  ?Result Value Ref Range  ? Color, Urine YELLOW YELLOW  ? APPearance CLOUDY (A) CLEAR  ? Specific Gravity, Urine 1.015 1.005 - 1.030  ? pH 7.5 5.0 - 8.0  ? Glucose, UA NEGATIVE NEGATIVE mg/dL  ? Hgb urine dipstick NEGATIVE NEGATIVE  ? Bilirubin Urine NEGATIVE NEGATIVE  ? Ketones, ur NEGATIVE NEGATIVE mg/dL  ? Protein, ur NEGATIVE NEGATIVE mg/dL  ? Nitrite NEGATIVE NEGATIVE  ? Leukocytes,Ua LARGE (A) NEGATIVE  ?  Comment: Performed at [facility], [address] [place], [place] [address]  ?Urinalysis, Microscopic (reflex)     Status: Abnormal  ? Collection Time: 01/20/22  4:08 PM  ?Result Value Ref Range  ? RBC / HPF 0-5 0 - 5 RBC/hpf  ? WBC, UA >50 0 - 5 WBC/hpf  ? Bacteria, UA MANY (A) NONE SEEN  ? Squamous Epithelial / LPF 21-50 0 - 5  ? Amorphous Crystal PRESENT   ?  Comment: Performed at [facility], [address]  [place], [place] [address]  ?VITAMIN D 25 Hydroxy (Vit-D Deficiency, Fractures)     Status: Abnormal  ? Collection Time: 01/21/22  6:31 AM  ?Result Value Ref Range  ? Vit D, 25-Hydroxy 22.48 (L) 30 - 100 ng/mL  ?

## 2022-01-22 NOTE — Progress Notes (Signed)
?   01/22/22 1047  ?Psych Admission Type (Psych Patients Only)  ?Admission Status Involuntary  ?Psychosocial Assessment  ?Patient Complaints None  ?Eye Contact Brief  ?Facial Expression Flat  ?Affect Blunted  ?Speech Soft  ?Interaction Avoidant;Minimal  ?Motor Activity Other (Comment) ?(WNL)  ?Appearance/Hygiene Unremarkable  ?Behavior Characteristics Cooperative  ?Mood Preoccupied  ?Thought Process  ?Coherency Blocking  ?Content WDL  ?Delusions None reported or observed  ?Perception WDL  ?Hallucination None reported or observed  ?Judgment Impaired  ?Confusion Mild  ?Danger to Self  ?Current suicidal ideation? Denies  ?Danger to Others  ?Danger to Others None reported or observed  ? ? ?

## 2022-01-23 DIAGNOSIS — F29 Unspecified psychosis not due to a substance or known physiological condition: Secondary | ICD-10-CM | POA: Diagnosis not present

## 2022-01-23 MED ORDER — HALOPERIDOL 5 MG PO TABS
5.0000 mg | ORAL_TABLET | Freq: Every day | ORAL | Status: DC
  Administered 2022-01-23: 5 mg via ORAL
  Filled 2022-01-23 (×2): qty 1

## 2022-01-23 NOTE — Progress Notes (Signed)
?   01/23/22 2301  ?Psych Admission Type (Psych Patients Only)  ?Admission Status Involuntary  ?Psychosocial Assessment  ?Patient Complaints None  ?Eye Contact Brief  ?Facial Expression Flat  ?Affect Blunted  ?Speech Soft  ?Interaction Minimal;Isolative  ?Motor Activity Other (Comment) ?(WDL)  ?Appearance/Hygiene Improved  ?Behavior Characteristics Calm  ?Mood Preoccupied  ?Thought Process  ?Coherency Blocking  ?Content WDL  ?Delusions None reported or observed  ?Perception WDL  ?Hallucination None reported or observed  ?Judgment Impaired  ?Confusion Mild  ?Danger to Self  ?Current suicidal ideation? Denies  ?Danger to Others  ?Danger to Others None reported or observed  ? ? ?

## 2022-01-23 NOTE — Group Note (Signed)
[facility] Group Notes: (Clinical Social Work)  ? ?01/23/2022    ? ? ?Type of Therapy:  Group Therapy  ? ?Participation Level:  Did Not Attend - was invited both individually by [facility] and by overhead announcement, chose not to attend. ? ? ?Marshawn Normoyle Grossman-Orr, LCSW ?01/23/2022, 11:13 AM ?   ?

## 2022-01-23 NOTE — Progress Notes (Addendum)
[facility] MD Progress Note ? ?01/23/2022 7:15 AM ?Barbara Gordon  ?MRN:  [id number] ? ?Chief Complaint: paranoia/psychosis ? ?Reason for Admission:  ?Barbara Gordon is a 31 y.o. female with a history of TBI [facility], epidural hematoma, and temporal skull fracture in 2021), previous polysubstance abuse, previous diagnoses of bipolar d/o vs substance induced mood d/o vs schizoaffective d/o, previous suicide attempt via OD, and previous treatment of psychosis, who was initially admitted under IVC on 01/18/2022 for management of self-care deficits, bizarre behaviors, and paranoia. The patient is currently on Hospital Day 5.  ? ?Chart Review from last 24 hours:  ?The patient's chart was reviewed and nursing notes were reviewed. The patient's case was discussed in multidisciplinary team meeting.  Per nursing she appeared anxious, was avoidant and minimal interactions with intermittent thought blocking.  She attempted group.  She had no acute behavioral issues noted.  Per MAR she was compliant with scheduled medications and did receive Vistaril x1 for anxiety. ? ?Information Obtained Today During Patient Interview: ?The patient was seen and evaluated on the unit.  She is resting in her bed on exam and states that she did not get up for breakfast.  She has not showered and resists prompts to attend to her hygiene.  She continues to have no insight into the reason she was admitted to the hospital.  She admits that she was "drinking a little" prior to admission but again is adamant that she was not using drugs.  She was again confronted about the positive urine drug screen and remains avoidant and ambivalent when attempting to discuss substance use.  She states her appetite and sleep been fair.  She denies SI, HI, AVH, paranoia, ideas of reference or first rank symptom; however, clinically she has poverty of follow-up and some residual blocking on exam today.  She remains guarded and concrete on assessment.  She denies medication side  effects and voices no physical complaints.  She denies feelings of anxiety or depression. ? ?Principal Problem: 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HCC) ?Diagnosis: Principal Problem: ?  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HCC) ?Active Problems: ?  History of seizure ?  Cannabis abuse ? ?Total Time Spent in Direct Patient Care:  ?I personally spent 30 minutes on the unit in direct patient care. The direct patient care time included face-to-face time with the patient, reviewing the patient's chart, communicating with other professionals, and coordinating care. Greater than 50% of this time was spent in counseling or coordinating care with the patient regarding goals of hospitalization, psycho-education, and discharge planning needs. ? ?Past Psychiatric History: see H&P ? ?Past Medical History: see H&P ? ?Family History: see H&P ? ?Family Psychiatric  History: see H&P ? ?Social History:  ?Social History  ? ?Substance and Sexual Activity  ?Alcohol Use None  ?   ?Social History  ? ?Substance and Sexual Activity  ?Drug Use Never  ?  ?Social History  ? ?Socioeconomic History  ? Marital status: Single  ?  Spouse name: Not on file  ? Number of children: Not on file  ? Years of education: Not on file  ? Highest education level: Not on file  ?Occupational History  ? Not on file  ?Tobacco Use  ? Smoking status: Some Days  ?  Types: Cigarettes  ? Smokeless tobacco: Never  ?Substance and Sexual Activity  ? Alcohol use: Not on file  ? Drug use: Never  ? Sexual activity: Never  ?  Other Topics Concern  ? Not on file  ?Social History Narrative  ? Not on file  ? ?Social Determinants of Health  ? ?Financial Resource Strain: Not on file  ?Food Insecurity: Not on file  ?Transportation Needs: Not on file  ?Physical Activity: Not on file  ?Stress: Not on file  ?Social Connections: Not on file  ? ?Sleep: Fair ? ?Appetite:  Fair ? ?Current  Medications: ?Current Facility-Administered Medications  ?Medication Dose Route Frequency Provider Last Rate Last Admin  ? acetaminophen (TYLENOL) tablet 650 mg  650 mg Oral Q6H PRN Ardis Hughsoleman, Carolyn H, NP      ? alum & mag hydroxide-simeth (MAALOX/MYLANTA) 200-200-20 MG/5ML suspension 30 mL  30 mL Oral Q4H PRN Ardis Hughsoleman, Carolyn H, NP      ? benztropine (COGENTIN) tablet 0.5 mg  0.5 mg Oral BID PRN Comer LocketSingleton, Amily Depp E, MD      ? feeding supplement (ENSURE ENLIVE / ENSURE PLUS) liquid 237 mL  237 mL Oral BID BM Massengill, Harrold DonathNathan, MD   237 mL at 01/22/22 1602  ? hydrOXYzine (ATARAX) tablet 25 mg  25 mg Oral Q6H PRN Comer LocketSingleton, Phinley Schall E, MD      ? lamoTRIgine (LAMICTAL) tablet 25 mg  25 mg Oral Daily Comer LocketSingleton, Janeece Blok E, MD   25 mg at 01/22/22 0743  ? loperamide (IMODIUM) capsule 2-4 mg  2-4 mg Oral PRN Comer LocketSingleton, Breona Cherubin E, MD      ? OLANZapine zydis (ZYPREXA) disintegrating tablet 10 mg  10 mg Oral Q8H PRN Ardis Hughsoleman, Carolyn H, NP      ? And  ? LORazepam (ATIVAN) tablet 1 mg  1 mg Oral PRN Ardis Hughsoleman, Carolyn H, NP      ? And  ? ziprasidone (GEODON) injection 20 mg  20 mg Intramuscular PRN Ardis Hughsoleman, Carolyn H, NP      ? LORazepam (ATIVAN) tablet 1 mg  1 mg Oral Q6H PRN Mason JimSingleton, Marki Frede E, MD      ? magnesium hydroxide (MILK OF MAGNESIA) suspension 30 mL  30 mL Oral Daily PRN Ardis Hughsoleman, Carolyn H, NP      ? multivitamin with minerals tablet 1 tablet  1 tablet Oral Daily Comer LocketSingleton, Osamu Olguin E, MD   1 tablet at 01/22/22 [id number]  ? nicotine polacrilex (NICORETTE) gum 2 mg  2 mg Oral PRN Phineas InchesMassengill, Nathan, MD   2 mg at 01/22/22 1711  ? nitrofurantoin (macrocrystal-monohydrate) (MACROBID) capsule 100 mg  100 mg Oral Q12H Mason JimSingleton, Shandon Matson E, MD   100 mg at 01/22/22 2203  ? OLANZapine zydis (ZYPREXA) disintegrating tablet 15 mg  15 mg Oral QHS Comer LocketSingleton, Lyberti Thrush E, MD   15 mg at 01/22/22 2203  ? OLANZapine zydis (ZYPREXA) disintegrating tablet 5 mg  5 mg Oral Daily Comer LocketSingleton, Jeanine Caven E, MD   5 mg at 01/22/22 0743  ? ondansetron (ZOFRAN-ODT) disintegrating tablet 4  mg  4 mg Oral Q6H PRN Bartholomew CrewsSingleton, Sandy Blouch E, MD      ? thiamine tablet 100 mg  100 mg Oral Daily Mason JimSingleton, Johnthomas Lader E, MD   100 mg at 01/22/22 0743  ? traZODone (DESYREL) tablet 50 mg  50 mg Oral QHS PRN Ardis Hughsoleman, Carolyn H, NP      ? Vitamin D (Ergocalciferol) (DRISDOL) capsule 50,000 Units  50,000 Units Oral Q7 days Comer LocketSingleton, Nasira Janusz E, MD   50,000 Units at 01/22/22 [id number]  ? ? ?Lab Results:  ?No results found for this or any previous visit (from the past 48 hour(s)). ? ? ?Blood Alcohol level:  ?Lab Results  ?  Component Value Date  ? ETH <10 01/15/2022  ? ETH <10 01/08/2022  ? ? ?Metabolic Disorder Labs: ?Lab Results  ?Component Value Date  ? HGBA1C 5.0 01/15/2022  ? MPG 97 01/15/2022  ? MPG 96.8 10/09/2021  ? ?Lab Results  ?Component Value Date  ? PROLACTIN 2.6 (L) 01/15/2022  ? ?Lab Results  ?Component Value Date  ? CHOL 171 01/15/2022  ? TRIG 10 01/15/2022  ? HDL 36 (L) 01/15/2022  ? CHOLHDL 4.8 01/15/2022  ? VLDL 2 01/15/2022  ? LDLCALC 133 (H) 01/15/2022  ? LDLCALC 131 (H) 10/09/2021  ? ? ?Physical Findings: ?CIWA:  CIWA-Ar Total: 1 ? ?Musculoskeletal: ?Strength & Muscle Tone: normal ?Gait & Station: steady, normal ?Patient leans: N/A ? ?Psychiatric Specialty Exam: ? ?Presentation  ?General Appearance: Disheveled wearing sign clothes since admission ? ?Eye Contact:staring quality ? ?Speech:clear and coherent but answers in 1-2 word phrases ? ?Speech Volume:Normal ? ?Mood and Affect  ?Mood:aloof, ambivalent, at times anxious ? ?Affect:constricted, guarded ? ? ?Thought Process  ?Thought Processes: concrete with some poverty of thought and no elaboration on answers with intermittent thought blocking ? ?Orientation:oriented to self, month, year , President but not to situation ? ?Thought Content:Appears paranoid and guarded on exam with some poverty of thought and intermittent thought blocking; denies AVH, paranoia, ideas of reference or first rank symptoms ? ?Hallucinations:Denied ? ?Ideas of Reference:Denied ? ?Suicidal  Thoughts:Denied ? ?Homicidal Thoughts:Denied ? ? ?Sensorium  ?Memory:Poor ? ?Judgment:Poor ? ?Insight:Poor ? ? ?Executive Functions  ?Concentration:Poor ? ?Attention Span:Poor ? ?Recall:Poor ? ?Fund of Knowledge:Fair ? ?Daryl Eastern

## 2022-01-23 NOTE — Progress Notes (Signed)
? ? ?   01/22/22 2203  ?Psych Admission Type (Psych Patients Only)  ?Admission Status Involuntary  ?Psychosocial Assessment  ?Patient Complaints None  ?Eye Contact Brief  ?Facial Expression Flat  ?Affect Blunted  ?Speech Soft  ?Interaction Avoidant;Minimal  ?Motor Activity Other (Comment) ?(WDL)  ?Appearance/Hygiene Unremarkable  ?Behavior Characteristics Cooperative  ?Mood Preoccupied  ?Thought Process  ?Coherency Blocking  ?Content WDL  ?Delusions None reported or observed  ?Perception WDL  ?Hallucination None reported or observed  ?Judgment Impaired  ?Confusion Mild  ?Danger to Self  ?Current suicidal ideation? Denies  ?Danger to Others  ?Danger to Others None reported or observed  ? ? ?

## 2022-01-23 NOTE — Progress Notes (Signed)
D. Pt presents with a flat affect/ depressed mood, calm, minimal during interactions-appears to be thought blocking. Pt has been cooperative, but shows no initiative, has to be told what to do. Pt showered today, but had to be led to the bathroom and given towels, toiletries. Pt currently denies SI/HI and AVH  ?A. Labs and vitals monitored. Pt given and educated on medications. Pt supported emotionally and encouraged to express concerns and ask questions.   ?R. Pt remains safe with 15 minute checks. Will continue POC. ? ?  ?

## 2022-01-24 DIAGNOSIS — F29 Unspecified psychosis not due to a substance or known physiological condition: Secondary | ICD-10-CM | POA: Diagnosis not present

## 2022-01-24 LAB — URINE CULTURE: Culture: 50000 — AB

## 2022-01-24 MED ORDER — HALOPERIDOL 5 MG PO TABS
5.0000 mg | ORAL_TABLET | Freq: Two times a day (BID) | ORAL | Status: DC
Start: 2022-01-24 — End: 2022-01-27
  Administered 2022-01-24 – 2022-01-27 (×6): 5 mg via ORAL
  Filled 2022-01-24 (×10): qty 1

## 2022-01-24 MED ORDER — OLANZAPINE 15 MG PO TBDP
15.0000 mg | ORAL_TABLET | Freq: Every day | ORAL | Status: AC
  Administered 2022-01-24: 15 mg via ORAL
  Filled 2022-01-24: qty 1

## 2022-01-24 MED ORDER — OLANZAPINE 10 MG PO TABS
20.0000 mg | ORAL_TABLET | Freq: Every day | ORAL | Status: DC
  Administered 2022-01-25: 20 mg via ORAL
  Filled 2022-01-24 (×4): qty 2

## 2022-01-24 NOTE — Progress Notes (Addendum)
D. Pt presents with a flat affect-continues to isolate, and has to be prompted to attend groups and go down for meals. Pt is very minimal during interactions, giving very short answers to questions. Pt denied SI/HI and when asked about A/VH, pt responded, "I don't know." Pt voices no complaints- has been eating and drinking Per pt's self inventory, pt rated her depression, hopelessness and anxiety a 5/0/0, respectively. Pt wrote that her goal was  "to get better", and that she will accomplish this by "sleeping"  ?A. Labs and vitals monitored. Pt compliant with  medications. Pt supported emotionally and encouraged to express concerns and ask questions.   ?R. Pt remains safe with 15 minute checks. Will continue POC. ? ?  ?

## 2022-01-24 NOTE — Group Note (Signed)
[facility] LCSW Group Therapy Note ? ?Date/Time:  01/24/2022  10:00AM-11:00AM ? ?Type of Therapy and Topic:  Group Therapy:  Music's Effect on Depression and Anxiety ? ?Participation Level:  None  ? ?Description of Group: ?In this process group, members discussed what types of music trigger them to worsening mental health symptoms and what they can do about this in the future.  For instance, we discussed what to do when riding in a friend's car and a song harmful to the patient starts playing.  We also discussed how music can be used as a tool to help with wellness and recovery in various ways including managing depression and anxiety as well as encouraging healthy sleep habits and avoiding relapse into old negative behaviors such as drinking, self-harming, or staying in bed all day.  A variety of songs were played as examples.  Discussion was elicited which showed the group to be in agreement that the songs were quite relatable and have the potential to help them outside of the hospital setting. ? ?Therapeutic Goals: ?Patients will be introduced to a variety of songs that can relate to self-image and self-love in a helpful way. ?Patients will explore the impact of different pieces of music on their feelings, i.e. uplifting, triggering, etc. ?Patients will discuss how to use this self-knowledge to assist in recovery. ? ?Summary of Patient Progress:  At the beginning of group, patient expressed "I don't know" when asked about music that triggers or soothes her.  The patient was silent throughout group, with her leg jiggling up and down with seeming anxiety.  Her affect was flat and depressed.. ? ?Therapeutic Modalities: ?Solution Focused Brief Therapy ?Activity ? ? ?Ambrose Mantle, LCSW ? ?  ?

## 2022-01-24 NOTE — Group Note (Signed)
Date:  01/24/2022 ?Time:  10:37 AM ? ?Group Topic/Focus:  ?Goals Group:   The focus of this group is to help patients establish daily goals to achieve during treatment and discuss how the patient can incorporate goal setting into their daily lives to aide in recovery. ? ? ? ?Participation Level:  minimal  ? ?Participation Quality:  lacking ? ?Affect:  Flat ? ?Cognitive:  Lacking ? ?Insight: Lacking ? ?Engagement in Group:  Limited ? ?Modes of Intervention:  Discussion ? ?Additional Comments: Patient wants to work on her discharge and getting better. Discussed with patient to set her goals, ask questions of her doctor and social worker to work on her discharge plans.  ? ?Barbara Gordon ?01/24/2022, 10:37 AM ? ?

## 2022-01-24 NOTE — Progress Notes (Addendum)
[facility] ? ?01/24/2022 6:51 AM ?Barbara Gordon  ?MRN:  [id number] ? ?Chief Complaint: paranoia/psychosis ? ?Reason for Admission:  ?Barbara Gordon is a 31 y.o. female with a history of TBI [facility](SAH, epidural hematoma, and temporal skull fracture in 2021), previous polysubstance abuse, previous diagnoses of bipolar d/o vs substance induced mood d/o vs schizoaffective d/o, previous suicide attempt via OD, and previous treatment of psychosis, who was initially admitted under IVC on 01/18/2022 for management of self-care deficits, bizarre behaviors, and paranoia. The patient is currently on Hospital Day 6.  ? ?Chart Review from last 24 hours:  ?The patient's chart was reviewed and nursing notes were reviewed. The patient's case was discussed in multidisciplinary team meeting.  Per nursing she had thought blocking noted and no initiative to complete tasks without prompts. She showered with directions provided by nursing. She has been isolative and minimal in her interactions.[facility] with scheduled medications and did not require PRNs. ? ?Information Obtained Today During Patient Interview: ?The patient was seen and evaluated on the unit.  She is in bed this morning and states she has not eaten breakfast. She remembers being prompted to shower yesterday and was encouraged to allow staff to wash her clothes and shower again today. She continues to have poverty of thought with limited answers to questions. She reports good sleep and appetite and voices no physical complaints. She did not attend groups yesterday and was advised that she will be prompted by staff to attend groups today. She denies SI, HI, AVH, paranoia, ideas of reference or first rank symptoms. She denies medication side-effects. She continues to deny drug use prior to admission and states she is unsure why she is here or where she will go after discharge. She states she has not talked to her friend since admission. She is oriented to  month, year, upcoming holiday, city and [place].  ? ?Principal Problem: 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HCC) ?Diagnosis: Principal Problem: ?  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HCC) ?Active Problems: ?  History of seizure ?  Cannabis abuse ? ?Total Time Spent in Direct Patient Care:  ?I personally spent 25 minutes on the unit in direct patient care. The direct patient care time included face-to-face time with the patient, reviewing the patient's chart, communicating with other professionals, and coordinating care. Greater than 50% of this time was spent in counseling or coordinating care with the patient regarding goals of hospitalization, psycho-education, and discharge planning needs. ? ?Past Psychiatric History: see H&P ? ?Past Medical History: see H&P ? ?Family History: see H&P ? ?Family Psychiatric  History: see H&P ? ?Social History:  ?Social History  ? ?Substance and Sexual Activity  ?Alcohol Use None  ?   ?Social History  ? ?Substance and Sexual Activity  ?Drug Use Never  ?  ?Social History  ? ?Socioeconomic History  ? Marital status: Single  ?  Spouse name: Not on file  ? Number of children: Not on file  ? Years of education: Not on file  ? Highest education level: Not on file  ?Occupational History  ? Not on file  ?Tobacco Use  ? Smoking status: Some Days  ?  Types: Cigarettes  ? Smokeless tobacco: Never  ?Substance and Sexual Activity  ? Alcohol use: Not on file  ? Drug use: Never  ? Sexual activity: Never  ?Other Topics Concern  ? Not on file  ?Social History Narrative  ?  Not on file  ? ?Social Determinants of Health  ? ?Financial Resource Strain: Not on file  ?Food Insecurity: Not on file  ?Transportation Needs: Not on file  ?Physical Activity: Not on file  ?Stress: Not on file  ?Social Connections: Not on file  ? ?Sleep: Good ? ?Appetite:  Fair ? ?Current Medications: ?Current Facility-Administered  Medications  ?Medication Dose Route Frequency Provider Last Rate Last Admin  ? acetaminophen (TYLENOL) tablet 650 mg  650 mg Oral Q6H PRN Ardis Hughs, NP      ? alum & mag hydroxide-simeth (MAALOX/MYLANTA) 200-200-20 MG/5ML suspension 30 mL  30 mL Oral Q4H PRN Ardis Hughs, NP      ? benztropine (COGENTIN) tablet 0.5 mg  0.5 mg Oral BID PRN Comer Locket, MD      ? feeding supplement (ENSURE ENLIVE / ENSURE PLUS) liquid 237 mL  237 mL Oral BID BM Massengill, Harrold Donath, MD   237 mL at 01/23/22 1027  ? haloperidol (HALDOL) tablet 5 mg  5 mg Oral QHS Mason Jim, Diasia Henken E, MD   5 mg at 01/23/22 2140  ? hydrOXYzine (ATARAX) tablet 25 mg  25 mg Oral Q6H PRN Comer Locket, MD      ? lamoTRIgine (LAMICTAL) tablet 25 mg  25 mg Oral Daily Comer Locket, MD   25 mg at 01/23/22 2035  ? loperamide (IMODIUM) capsule 2-4 mg  2-4 mg Oral PRN Comer Locket, MD      ? OLANZapine zydis (ZYPREXA) disintegrating tablet 10 mg  10 mg Oral Q8H PRN Ardis Hughs, NP      ? And  ? LORazepam (ATIVAN) tablet 1 mg  1 mg Oral PRN Ardis Hughs, NP      ? And  ? ziprasidone (GEODON) injection 20 mg  20 mg Intramuscular PRN Ardis Hughs, NP      ? LORazepam (ATIVAN) tablet 1 mg  1 mg Oral Q6H PRN Mason Jim, Jalik Gellatly E, MD      ? magnesium hydroxide (MILK OF MAGNESIA) suspension 30 mL  30 mL Oral Daily PRN Ardis Hughs, NP      ? multivitamin with minerals tablet 1 tablet  1 tablet Oral Daily Comer Locket, MD   1 tablet at 01/23/22 5974  ? nicotine polacrilex (NICORETTE) gum 2 mg  2 mg Oral PRN Phineas Inches, MD   2 mg at 01/22/22 1711  ? nitrofurantoin (macrocrystal-monohydrate) (MACROBID) capsule 100 mg  100 mg Oral Q12H Mason Jim, Shahzad Thomann E, MD   100 mg at 01/23/22 2141  ? OLANZapine zydis (ZYPREXA) disintegrating tablet 15 mg  15 mg Oral QHS Comer Locket, MD   15 mg at 01/23/22 2140  ? OLANZapine zydis (ZYPREXA) disintegrating tablet 5 mg  5 mg Oral Daily Comer Locket, MD   5 mg at 01/23/22 1638   ? ondansetron (ZOFRAN-ODT) disintegrating tablet 4 mg  4 mg Oral Q6H PRN Bartholomew Crews E, MD      ? thiamine tablet 100 mg  100 mg Oral Daily Mason Jim, Ayomide Purdy E, MD   100 mg at 01/23/22 4536  ? traZODone (DESYREL) tablet 50 mg  50 mg Oral QHS PRN Ardis Hughs, NP      ? Vitamin D (Ergocalciferol) (DRISDOL) capsule 50,000 Units  50,000 Units Oral Q7 days Comer Locket, MD   50,000 Units at 01/22/22 4680  ? ? ?Lab Results:  ?No results found for this or any previous visit (from the past 48  hour(s)). ? ? ?Blood Alcohol level:  ?Lab Results  ?Component Value Date  ? ETH <10 01/15/2022  ? ETH <10 01/08/2022  ? ? ?Metabolic Disorder Labs: ?Lab Results  ?Component Value Date  ? HGBA1C 5.0 01/15/2022  ? MPG 97 01/15/2022  ? MPG 96.8 10/09/2021  ? ?Lab Results  ?Component Value Date  ? PROLACTIN 2.6 (L) 01/15/2022  ? ?Lab Results  ?Component Value Date  ? CHOL 171 01/15/2022  ? TRIG 10 01/15/2022  ? HDL 36 (L) 01/15/2022  ? CHOLHDL 4.8 01/15/2022  ? VLDL 2 01/15/2022  ? LDLCALC 133 (H) 01/15/2022  ? LDLCALC 131 (H) 10/09/2021  ? ? ?Physical Findings: ?CIWA:  CIWA-Ar Total: 0 ? ?Musculoskeletal: ?Strength & Muscle Tone: normal ?Gait & Station: steady, normal ?Patient leans: N/A ? ?Psychiatric Specialty Exam: ? ?Presentation  ?General Appearance: Disheveled wearing same clothes since admission but did reportedly shower yesterday ? ?Eye Contact:staring quality ? ?Speech:clear and coherent but answers in 1-2 word phrases ? ?Speech Volume:Normal ? ?Mood and Affect  ?Mood:aloof, ambivalent, at times anxious ? ?Affect:constricted, guarded ? ? ?Thought Process  ?Thought Processes: concrete with poverty of thought and no elaboration on answers with intermittent thought blocking ? ?Orientation:oriented to self, month, year, upcoming holiday,  President but not to situation ? ?Thought Content:Appears paranoid and guarded on exam with poverty of thought and intermittent thought blocking; denies AVH, paranoia, ideas of reference  or first rank symptoms - is not grossly responding to internal stimuli on exam ? ?Hallucinations:Denied ? ?Ideas of Reference:Denied ? ?Suicidal Thoughts:Denied ? ?Homicidal Thoughts:Denied ? ? ?Sensorium  ?

## 2022-01-25 ENCOUNTER — Encounter (HOSPITAL_COMMUNITY): Payer: Self-pay

## 2022-01-25 LAB — VITAMIN B1: Vitamin B1 (Thiamine): 198.3 nmol/L (ref 66.5–200.0)

## 2022-01-25 MED ORDER — LAMOTRIGINE 25 MG PO TABS: 50.0000 mg | ORAL_TABLET | Freq: Every day | ORAL | Status: DC

## 2022-01-25 MED ORDER — FLUOXETINE HCL 10 MG PO CAPS
10.0000 mg | ORAL_CAPSULE | Freq: Every day | ORAL | Status: DC
  Administered 2022-01-25 – 2022-01-30 (×6): 10 mg via ORAL
  Filled 2022-01-25 (×5): qty 1
  Filled 2022-01-25: qty 7
  Filled 2022-01-25 (×4): qty 1

## 2022-01-25 NOTE — Progress Notes (Signed)
Adult Psychoeducational Group Note ? ?Date:  01/25/2022 ?Time:  12:59 AM ? ?Group Topic/Focus:  ?Wrap-Up Group:   The focus of this group is to help patients review their daily goal of treatment and discuss progress on daily workbooks. ? ?Participation Level:  Did Not Attend ? ?Participation Quality:   Did Not Attend ? ?Affect:  Did Not Attend ? ?Cognitive:  Did Not Attend ? ?Insight: None ? ?Engagement in Group:  Did Not Attend ? ?Modes of Intervention:  Did Not Attend ? ?Additional Comments:  Pt was encouraged to attend wrap up group but did not attend. ? ?Felipa Furnace ?01/25/2022, 12:59 AM ?

## 2022-01-25 NOTE — Group Note (Signed)
Recreation Therapy Group Note ? ? ?Group Topic:Stress Management  ?Group Date: 01/25/2022 ?Start Time: 0930 ?End Time: 0955 ?Facilitators: Caroll Rancher, LRT,CTRS ?Location: [address] Dayroom ? ? ?Goal Area(s) Addresses:  ?Patient will identify positive stress management techniques. ?Patient will identify benefits of using stress management post d/c. ? ?Group Description:  Meditation.  LRT played a meditation that focused on finding your rhythm through breathing to help calm stress.  Patients were to let whatever rhythm of breathing that was comfortable to them, calm and relax them.  Patients were to listen along as meditation played to fully engage in activity. ? ? ?Affect/Mood: Appropriate ?  ?Participation Level: Active ?  ?Participation Quality: Independent ?  ?Behavior: Attentive  ?  ?Speech/Thought Process: Focused ?  ?Insight: Good ?  ?Judgement: Good ?  ?Modes of Intervention: Meditation ?  ?Patient Response to Interventions:  Engaged ?  ?Education Outcome: ? Acknowledges education and In group clarification offered   ? ?Clinical Observations/Individualized Feedback:  Pt attended and participated in group session. ? ? ?Plan: Continue to engage patient in RT group sessions 2-3x/week. ? ? ?Caroll Rancher, LRT,CTRS ?01/25/2022 11:36 AM ?

## 2022-01-25 NOTE — Progress Notes (Signed)
Pt denies SI/HI/AVH and verbally agrees to approach staff if these become apparent or before harming themselves/others. Rates depression 0/10. Rates anxiety 0/10. Rates pain 0/10. Pt has been isolative and guarded. Pt is very minimal in speech. Pt is cooperative to get out of room during groups and meals. Scheduled medications administered to pt, per MD orders. RN provided support and encouragement to pt. Q15 min safety checks implemented and continued. Pt safe on the unit. RN will continue to monitor and intervene as needed.  ? 01/25/22 0747  ?Psych Admission Type (Psych Patients Only)  ?Admission Status Involuntary  ?Psychosocial Assessment  ?Patient Complaints None  ?Eye Contact Brief  ?Facial Expression Blank;Flat  ?Affect Flat;Blunted  ?Speech Soft  ?Interaction Forwards little;Guarded;Minimal;Isolative  ?Motor Activity Slow;Tremors  ?Appearance/Hygiene Body odor;In scrubs  ?Behavior Characteristics Cooperative;Guarded  ?Mood Empty  ?Thought Process  ?Coherency Blocking  ?Content WDL  ?Delusions None reported or observed  ?Perception WDL  ?Hallucination None reported or observed  ?Judgment Poor  ?Confusion None  ?Danger to Self  ?Current suicidal ideation? Denies  ?Danger to Others  ?Danger to Others None reported or observed  ? ? ?

## 2022-01-25 NOTE — Group Note (Signed)
LCSW Group Therapy Note ? ? ?Group Date: 01/25/2022 ?Start Time: 1300 ?End Time: 1400 ? ? ?Type of Therapy and Topic:  Group Therapy:  Positive Affirmations ?  ?Participation Level:  Active ? ?Description of Group: ?This group addressed positive affirmation toward self and others. Patients went around the room and identified two positive things about themselves and two positive things about a peer in the room. Patients reflected on how it felt to share something positive with others, to identify positive things about themselves, and to hear positive things from others. Patients were encouraged to have a daily reflection of positive characteristics or circumstances. ?Therapeutic Goals ?Patient will verbalize two of their positive qualities ?Patient will demonstrate empathy for others by stating two positive qualities about a peer in the group ?Patient will verbalize their feelings when voicing positive self affirmations and when voicing positive affirmations of others ?Patients will discuss the potential positive impact on their wellness/recovery of focusing on positive traits of self and others. ? ? ?Summary of Patient Progress:  Due to the spread of Illness on the 300 and 400 halls group was not held in the dayroom.  CSW met with each patient individually as needed with PPE precautions in place to reduce the spread of any further Illness.  CSW provided the patient's with worksheet packets and instructions on how to complete those packets.  CSW will follow up with patient's as needed.  ? ? ?Therapeutic Modalities ?Cognitive Behavioral Therapy ?Motivational Interviewing ? ?Barbara Gordon M Marni Franzoni, LCSWA ?01/25/2022  1:53 PM   ? ?

## 2022-01-25 NOTE — Progress Notes (Signed)
?  On assessment, pt presents with no complaints. Pt is flat and mildly anxious. Pt is in bed and encouraged to get up for snack and medication.  Pt is slow moving but cooperative.  Pt's agreed goal for tomorrow is to reach out to family.  Pt denies SI/HI/AVH and verbally contracts for safety. ? ?Medication administered.  Vitals obtained. ? ?Pt is safe on the unit with Q 15 minute safety checks. ? ? ? 01/25/22 2143  ?Psych Admission Type (Psych Patients Only)  ?Admission Status Involuntary  ?Psychosocial Assessment  ?Patient Complaints None  ?Eye Contact Brief  ?Facial Expression Flat;Blank  ?Affect Flat;Blunted  ?Speech Soft  ?Interaction Forwards little;Guarded;Isolative;Minimal  ?Motor Activity Slow;Tremors ?(Steady)  ?Appearance/Hygiene Body odor;In scrubs  ?Behavior Characteristics Cooperative;Guarded  ?Mood Empty  ?Thought Process  ?Coherency Blocking  ?Content WDL  ?Delusions None reported or observed  ?Perception WDL  ?Hallucination None reported or observed  ?Judgment Poor  ?Confusion None  ?Danger to Self  ?Current suicidal ideation? Denies  ?Danger to Others  ?Danger to Others None reported or observed  ? ? ?

## 2022-01-25 NOTE — [facility] Group Notes (Signed)
Spiritual care group on grief and loss facilitated by chaplains Amy Burris, [facility] and Dyanne Carrel, [facility]  ? ?Group Goal:  ?Support / Education around grief and loss  ? ?Members engage in facilitated group support and psycho-social education.  ? ?Group Description:  ?Spiritual care group on grief and loss facilitated by chaplain Dyanne Carrel, [facility]  ? ?Group Goal:  ? ?Support / Education around grief and loss  ? ?Members engage in facilitated group support and psycho-social education.  ? ?Group Description:  ? ?Following introductions and group rules, group members engaged in facilitated group dialog and support around topic of loss, with particular support around experiences of loss in their lives. Group Identified types of loss (relationships / self / things) and identified patterns, circumstances, and changes that precipitate losses. Reflected on thoughts / feelings around loss, normalized grief responses, and recognized variety in grief experience. Group noted Worden's four tasks of grief in discussion.  ? ?Group drew on Adlerian / Rogerian, narrative, MI,  ? ?Patient Progress: Pt attended latter half of group. Pt was not talkative but appeared to be engaged based on eye contact, attentiveness. ?

## 2022-01-25 NOTE — Plan of Care (Signed)
?  Problem: Activity: Goal: Sleeping patterns will improve Outcome: Progressing   Problem: Education: Goal: Emotional status will improve Outcome: Not Progressing Goal: Mental status will improve Outcome: Not Progressing   Problem: Activity: Goal: Interest or engagement in activities will improve Outcome: Not Progressing   

## 2022-01-25 NOTE — [facility] Group Notes (Signed)
PT was made aware but did not attend group. ?

## 2022-01-25 NOTE — Progress Notes (Signed)
?   01/24/22 2020  ?Psych Admission Type (Psych Patients Only)  ?Admission Status Involuntary  ?Psychosocial Assessment  ?Patient Complaints None  ?Eye Contact Brief  ?Facial Expression Blank  ?Affect Blunted  ?Speech Soft  ?Interaction Isolative  ?Motor Activity Slow  ?Appearance/Hygiene Improved  ?Behavior Characteristics Appropriate to situation  ?Mood Preoccupied  ?Thought Process  ?Coherency Blocking  ?Content UTA  ?Delusions UTA  ?Perception UTA  ?Hallucination UTA  ?Judgment Poor  ?Confusion Mild  ?Danger to Self  ?Current suicidal ideation? Denies  ?Danger to Others  ?Danger to Others None reported or observed  ? ? ?

## 2022-01-25 NOTE — Progress Notes (Signed)
[facility] MD Progress Note ? ?01/25/2022 7:35 AM ?Barbara Gordon  ?MRN:  [id number] ? ?Chief Complaint: paranoia/psychosis ? ?Reason for Admission:  ?Barbara Gordon is a 31 y.o. female with a history of TBI [facility], epidural hematoma, and temporal skull fracture in 2021), previous polysubstance abuse, previous diagnoses of bipolar d/o vs substance induced mood d/o vs schizoaffective d/o, previous suicide attempt via OD, and previous treatment of psychosis, who was initially admitted under IVC on 01/18/2022 for management of self-care deficits, bizarre behaviors, and paranoia. The patient is currently on Hospital Day 7.  ? ?Chart Review from last 24 hours:  ?The patient's chart was reviewed and nursing notes were reviewed. The patient's case was discussed in multidisciplinary team meeting.  Per nursing, the patient attended SW group but did not participate and did not attend evening wrap up group. She continues to isolate and has to be prompted to attend groups, meals or for hygiene. She has been eating and drinking.Per MAR she only received one dose of Haldol yesterday. She did receive Vistaril X1 for anxiety and nicorette gum but not other PRNs. ? ?Information Obtained Today During Patient Interview: ?The patient was seen and evaluated on the unit.  She is wearing a clean set of scrubs today and appears to have changed clothes.  She was present in group today but appears anxious in the milieu.  She reports stable sleep and appetite.  She states that she has felt anxious and depressed but when asked to elaborate on any neurovegetative symptoms of depression she cannot give any explanation and is evasive and vague.  She has trouble writing the severity of her anxiety and depression.  She denies medication side effects.  She denies SI, HI, AVH, ideas of reference first rank symptoms or magical thinking.  She continues to have no insight into the reason for her admission and cannot give any description of the events that led up to  this admission.  States that she has not talked to her friend or any family members and does not understand why she is still in the hospital.  Time was spent discussing diet prior to admission there was concern that she was having bizarre behaviors she appears disinterested and ambivalent when attempting to discuss these.  ? ?MoCA score today is a 23 out of 30.  She had difficulty with visual-spatial executive function with trail making, had difficulty with attention in trying to repeat numbers backwards, had difficulty with serial subtractions and could not name more than 6 words that start with the letter F in terms of fluency of language had difficulty with recall and could only recall 1 of 5 words with delayed recall. ? ?Principal Problem: 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HCC) ?Diagnosis: Principal Problem: ?  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HCC) ?Active Problems: ?  History of seizure ?  Cannabis abuse ? ?Total Time Spent in Direct Patient Care:  ?I personally spent 40 minutes on the unit in direct patient care. The direct patient care time included face-to-face time with the patient, reviewing the patient's chart, communicating with other professionals, and coordinating care. Greater than 50% of this time was spent in counseling or coordinating care with the patient regarding goals of hospitalization, psycho-education, and discharge planning needs. ? ?Past Psychiatric History: see H&P ? ?Past Medical History: see H&P ? ?Family History: see H&P ? ?Family Psychiatric  History: see H&P ? ?Social History:  ?Social History  ? ?Substance and  Sexual Activity  ?Alcohol Use None  ?   ?Social History  ? ?Substance and Sexual Activity  ?Drug Use Never  ?  ?Social History  ? ?Socioeconomic History  ? Marital status: Single  ?  Spouse name: Not on file  ? Number of children: Not on file  ? Years of education: Not on file  ?  Highest education level: Not on file  ?Occupational History  ? Not on file  ?Tobacco Use  ? Smoking status: Some Days  ?  Types: Cigarettes  ? Smokeless tobacco: Never  ?Substance and Sexual Activity  ? Alcohol use: Not on file  ? Drug use: Never  ? Sexual activity: Never  ?Other Topics Concern  ? Not on file  ?Social History Narrative  ? Not on file  ? ?Social Determinants of Health  ? ?Financial Resource Strain: Not on file  ?Food Insecurity: Not on file  ?Transportation Needs: Not on file  ?Physical Activity: Not on file  ?Stress: Not on file  ?Social Connections: Not on file  ? ?Sleep: Good ? ?Appetite:  Good ? ?Current Medications: ?Current Facility-Administered Medications  ?Medication Dose Route Frequency Provider Last Rate Last Admin  ? acetaminophen (TYLENOL) tablet 650 mg  650 mg Oral Q6H PRN Revonda Humphrey, NP      ? alum & mag hydroxide-simeth (MAALOX/MYLANTA) 200-200-20 MG/5ML suspension 30 mL  30 mL Oral Q4H PRN Revonda Humphrey, NP      ? benztropine (COGENTIN) tablet 0.5 mg  0.5 mg Oral BID PRN Harlow Asa, MD      ? feeding supplement (ENSURE ENLIVE / ENSURE PLUS) liquid 237 mL  237 mL Oral BID BM Massengill, Ovid Curd, MD   237 mL at 01/24/22 1321  ? haloperidol (HALDOL) tablet 5 mg  5 mg Oral BID Viann Fish E, MD   5 mg at 01/24/22 0946  ? hydrOXYzine (ATARAX) tablet 25 mg  25 mg Oral Q6H PRN Harlow Asa, MD   25 mg at 01/24/22 1553  ? lamoTRIgine (LAMICTAL) tablet 25 mg  25 mg Oral Daily Harlow Asa, MD   25 mg at 01/24/22 0802  ? loperamide (IMODIUM) capsule 2-4 mg  2-4 mg Oral PRN Harlow Asa, MD      ? OLANZapine zydis (ZYPREXA) disintegrating tablet 10 mg  10 mg Oral Q8H PRN Revonda Humphrey, NP      ? And  ? LORazepam (ATIVAN) tablet 1 mg  1 mg Oral PRN Revonda Humphrey, NP      ? And  ? ziprasidone (GEODON) injection 20 mg  20 mg Intramuscular PRN Revonda Humphrey, NP      ? LORazepam (ATIVAN) tablet 1 mg  1 mg Oral Q6H PRN Nelda Marseille, Linwood Gullikson E, MD      ?  magnesium hydroxide (MILK OF MAGNESIA) suspension 30 mL  30 mL Oral Daily PRN Revonda Humphrey, NP      ? multivitamin with minerals tablet 1 tablet  1 tablet Oral Daily Nelda Marseille, Saniyyah Elster E, MD   1 tablet at 01/24/22 1320  ? nicotine polacrilex (NICORETTE) gum 2 mg  2 mg Oral PRN Janine Limbo, MD   2 mg at 01/24/22 1553  ? nitrofurantoin (macrocrystal-monohydrate) (MACROBID) capsule 100 mg  100 mg Oral Q12H Nelda Marseille, Rindi Beechy E, MD   100 mg at 01/24/22 2015  ? OLANZapine (ZYPREXA) tablet 20 mg  20 mg Oral QHS Nelda Marseille, Naomia Lenderman E, MD      ? ondansetron (ZOFRAN-ODT) disintegrating  tablet 4 mg  4 mg Oral Q6H PRN Viann Fish E, MD      ? thiamine tablet 100 mg  100 mg Oral Daily Nelda Marseille, Amaliya Whitelaw E, MD   100 mg at 01/24/22 1300  ? traZODone (DESYREL) tablet 50 mg  50 mg Oral QHS PRN Revonda Humphrey, NP      ? Vitamin D (Ergocalciferol) (DRISDOL) capsule 50,000 Units  50,000 Units Oral Q7 days Harlow Asa, MD   50,000 Units at 01/22/22 [id number]  ? ? ?Lab Results:  ?No results found for this or any previous visit (from the past 48 hour(s)). ? ? ?Blood Alcohol level:  ?Lab Results  ?Component Value Date  ? ETH <10 01/15/2022  ? ETH <10 01/08/2022  ? ? ?Metabolic Disorder Labs: ?Lab Results  ?Component Value Date  ? HGBA1C 5.0 01/15/2022  ? MPG 97 01/15/2022  ? MPG 96.8 10/09/2021  ? ?Lab Results  ?Component Value Date  ? PROLACTIN 2.6 (L) 01/15/2022  ? ?Lab Results  ?Component Value Date  ? CHOL 171 01/15/2022  ? TRIG 10 01/15/2022  ? HDL 36 (L) 01/15/2022  ? CHOLHDL 4.8 01/15/2022  ? VLDL 2 01/15/2022  ? LDLCALC 133 (H) 01/15/2022  ? LDLCALC 131 (H) 10/09/2021  ? ? ?Physical Findings: ?CIWA:  CIWA-Ar Total: 2 ? ?Musculoskeletal: ?Strength & Muscle Tone: normal ?Gait & Station: steady, normal ?Patient leans: N/A ? ?Psychiatric Specialty Exam: ? ?Presentation  ?General Appearance: Hair uncombed and has not showered today but has on clean scrubs and is not wearing same clothes ? ?Eye Contact:staring quality,  fair ? ?Speech:clear and coherent but answers in 1-2 word phrases ? ?Speech Volume:Normal ? ?Mood and Affect  ?Mood:aloof, ambivalent,  anxious ? ?Affect:constricted, anxious ? ? ?Thought Process  ?Thought Processes: concr

## 2022-01-25 NOTE — BH IP Treatment Plan (Signed)
Interdisciplinary Treatment and Diagnostic Plan Update ? ?01/25/2022 ?Time of Session: 9:25am ?Barbara Gordon ?MRN: [id number] ? ?Principal Diagnosis: 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HCC) ? ?Secondary Diagnoses: Principal Problem: ?  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HCC) ?Active Problems: ?  History of seizure ?  Cannabis abuse ? ? ?Current Medications:  ?Current Facility-Administered Medications  ?Medication Dose Route Frequency Provider Last Rate Last Admin  ? acetaminophen (TYLENOL) tablet 650 mg  650 mg Oral Q6H PRN Ardis Hughs, NP      ? alum & mag hydroxide-simeth (MAALOX/MYLANTA) 200-200-20 MG/5ML suspension 30 mL  30 mL Oral Q4H PRN Ardis Hughs, NP      ? benztropine (COGENTIN) tablet 0.5 mg  0.5 mg Oral BID PRN Comer Locket, MD      ? feeding supplement (ENSURE ENLIVE / ENSURE PLUS) liquid 237 mL  237 mL Oral BID BM Phineas Inches, MD   237 mL at 01/25/22 0943  ? FLUoxetine (PROZAC) capsule 10 mg  10 mg Oral Daily Mason Jim, Amy E, MD   10 mg at 01/25/22 5093  ? haloperidol (HALDOL) tablet 5 mg  5 mg Oral BID Comer Locket, MD   5 mg at 01/25/22 0747  ? hydrOXYzine (ATARAX) tablet 25 mg  25 mg Oral Q6H PRN Comer Locket, MD   25 mg at 01/24/22 1553  ? lamoTRIgine (LAMICTAL) tablet 25 mg  25 mg Oral Daily Comer Locket, MD   25 mg at 01/25/22 0747  ? loperamide (IMODIUM) capsule 2-4 mg  2-4 mg Oral PRN Comer Locket, MD      ? OLANZapine zydis (ZYPREXA) disintegrating tablet 10 mg  10 mg Oral Q8H PRN Ardis Hughs, NP      ? And  ? LORazepam (ATIVAN) tablet 1 mg  1 mg Oral PRN Ardis Hughs, NP      ? And  ? ziprasidone (GEODON) injection 20 mg  20 mg Intramuscular PRN Ardis Hughs, NP      ? LORazepam (ATIVAN) tablet 1 mg  1 mg Oral Q6H PRN Mason Jim, Amy E, MD      ? magnesium hydroxide (MILK OF MAGNESIA) suspension 30 mL  30 mL Oral Daily PRN Ardis Hughs,  NP      ? multivitamin with minerals tablet 1 tablet  1 tablet Oral Daily Comer Locket, MD   1 tablet at 01/25/22 2671  ? nicotine polacrilex (NICORETTE) gum 2 mg  2 mg Oral PRN Phineas Inches, MD   2 mg at 01/24/22 1553  ? nitrofurantoin (macrocrystal-monohydrate) (MACROBID) capsule 100 mg  100 mg Oral Q12H Mason Jim, Amy E, MD   100 mg at 01/25/22 0747  ? OLANZapine (ZYPREXA) tablet 20 mg  20 mg Oral QHS Singleton, Amy E, MD      ? ondansetron (ZOFRAN-ODT) disintegrating tablet 4 mg  4 mg Oral Q6H PRN Mason Jim, Amy E, MD      ? thiamine tablet 100 mg  100 mg Oral Daily Mason Jim, Amy E, MD   100 mg at 01/25/22 0747  ? traZODone (DESYREL) tablet 50 mg  50 mg Oral QHS PRN Ardis Hughs, NP      ? Vitamin D (Ergocalciferol) (DRISDOL) capsule 50,000 Units  50,000 Units Oral Q7 days Comer Locket, MD   50,000 Units at 01/22/22 0746  ? ?PTA Medications: ?Medications Prior to Admission  ?Medication Sig Dispense Refill Last Dose  ?  OLANZapine (ZYPREXA) 5 MG tablet Take 1 tablet (5 mg total) by mouth daily.     ? OLANZapine zydis (ZYPREXA) 10 MG disintegrating tablet Take 1 tablet (10 mg total) by mouth at bedtime.     ? ? ?Patient Stressors:   ? ?Patient Strengths:   ? ?Treatment Modalities: Medication Management, Group therapy, Case management,  ?1 to 1 session with clinician, Psychoeducation, Recreational therapy. ? ? ?Physician Treatment Plan for Primary Diagnosis: 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HCC) ?Long Term Goal(s): Improvement in symptoms so as ready for discharge  ? ?Short Term Goals: Ability to identify changes in lifestyle to reduce recurrence of condition will improve ?Ability to verbalize feelings will improve ?Ability to disclose and discuss suicidal ideas ?Ability to demonstrate self-control will improve ?Ability to identify and develop effective coping behaviors will improve ?Ability to maintain clinical measurements within normal limits  will improve ?Compliance with prescribed medications will improve ?Ability to identify triggers associated with substance abuse/mental health issues will improve ? ?Medication Management: Evaluate patient's response, side effects, and tolerance of medication regimen. ? ?Therapeutic Interventions: 1 to 1 sessions, Unit Group sessions and Medication administration. ? ?Evaluation of Outcomes: Not Progressing ? ?Physician Treatment Plan for Secondary Diagnosis: Principal Problem: ?  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HCC) ?Active Problems: ?  History of seizure ?  Cannabis abuse ? ?Long Term Goal(s): Improvement in symptoms so as ready for discharge  ? ?Short Term Goals: Ability to identify changes in lifestyle to reduce recurrence of condition will improve ?Ability to verbalize feelings will improve ?Ability to disclose and discuss suicidal ideas ?Ability to demonstrate self-control will improve ?Ability to identify and develop effective coping behaviors will improve ?Ability to maintain clinical measurements within normal limits will improve ?Compliance with prescribed medications will improve ?Ability to identify triggers associated with substance abuse/mental health issues will improve    ? ?Medication Management: Evaluate patient's response, side effects, and tolerance of medication regimen. ? ?Therapeutic Interventions: 1 to 1 sessions, Unit Group sessions and Medication administration. ? ?Evaluation of Outcomes: Not Progressing ? ? ?RN Treatment Plan for Primary Diagnosis: 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HCC) ?Long Term Goal(s): Knowledge of disease and therapeutic regimen to maintain health will improve ? ?Short Term Goals: Ability to remain free from injury will improve, Ability to verbalize frustration and anger appropriately will improve, Ability to demonstrate self-control, Ability to participate in decision making  will improve, Ability to verbalize feelings will improve, Ability to identify and develop effective coping behaviors will improve, and Compliance with prescribed medications will improve ? ?Medication Management: RN will administer medications as ordered by provider, will assess and evaluate patient's response and provide education to patient for prescribed medication. RN will report any adverse and/or side effects to prescribing provider. ? ?Therapeutic Interventions: 1 on 1 counseling sessions, Psychoeducation, Medication administration, Evaluate responses to treatment, Monitor vital signs and CBGs as ordered, Perform/monitor CIWA, COWS, AIMS and Fall Risk screenings as ordered, Perform wound care treatments as ordered. ? ?Evaluation of Outcomes: Not Progressing ? ? ?LCSW Treatment Plan for Primary Diagnosis: 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HCC) ?Long Term Goal(s): Safe transition to appropriate next level of care at discharge, Engage patient in therapeutic group addressing interpersonal concerns. ? ?Short Term Goals: Engage patient in aftercare planning with referrals and resources, Increase social support, Increase ability to appropriately verbalize feelings, Increase emotional regulation, Facilitate patient  progression through stages of change regarding substance use diagnoses and concerns, and Identify triggers associated with mental health/substance abuse issues ? ?Therapeutic Interventions: Assess for all discharge needs, 1 to 1 time with Child psychotherapist, Explore available resources and support systems, Assess for adequacy in community support network, Educate family and significant other(s) on suicide prevention, Complete Psychosocial Assessment, Interpersonal group therapy. ? ?Evaluation of Outcomes: Not Progressing ? ? ?Progress in Treatment: ?Attending groups: No. ?Participating in groups: No. ?Taking medication as prescribed: Yes. ?Toleration medication:  Yes. ?Family/Significant other contact made: Yes, individual(s) contacted:  Friend  ?Patient understands diagnosis: Yes. ?Discussing patient identified problems/goals with staff: Yes. ?Medical problems s

## 2022-01-26 DIAGNOSIS — F603 Borderline personality disorder: Secondary | ICD-10-CM | POA: Diagnosis present

## 2022-01-26 MED ORDER — OLANZAPINE 7.5 MG PO TABS
15.0000 mg | ORAL_TABLET | Freq: Every day | ORAL | Status: DC
  Administered 2022-01-26: 15 mg via ORAL
  Filled 2022-01-26 (×3): qty 2

## 2022-01-26 NOTE — Progress Notes (Signed)
Pt denies SI/HI/AVH and verbally agrees to approach staff if these become apparent or before harming themselves/others. Rates depression 5/10. Rates anxiety 5/10. Rates pain 0/10. Pt is still isolative and guarded. Pt is cooperative when asked to go to groups and meals. Scheduled medications administered to pt, per MD orders. RN provided support and encouragement to pt. Q15 min safety checks implemented and continued. Pt safe on the unit. RN will continue to monitor and intervene as needed.  ? 01/26/22 0806  ?Psych Admission Type (Psych Patients Only)  ?Admission Status Involuntary  ?Psychosocial Assessment  ?Patient Complaints Depression;Anxiety  ?Eye Contact Brief  ?Facial Expression Flat;Blank  ?Affect Flat;Blunted;Sad  ?Speech Logical/coherent;Soft  ?Interaction Guarded;Forwards little;Minimal;Isolative  ?Motor Activity Slow  ?Appearance/Hygiene In scrubs  ?Behavior Characteristics Cooperative;Guarded  ?Mood Empty  ?Thought Process  ?Coherency Blocking  ?Content WDL  ?Delusions None reported or observed  ?Perception WDL  ?Hallucination None reported or observed  ?Judgment Poor  ?Confusion None  ?Danger to Self  ?Current suicidal ideation? Denies  ?Danger to Others  ?Danger to Others None reported or observed  ? ? ?

## 2022-01-26 NOTE — Group Note (Signed)
Recreation Therapy Group Note ? ? ?Group Topic:Animal Assisted Therapy   ?Group Date: 01/26/2022 ?Start Time: 1430 ?End Time: 1515 ?Facilitators: Caroll Rancher, LRT,CTRS ?Location: [address] Day[address] ? ? ?Animal-Assisted Activity (AAA) Program Checklist/Progress Note ?Patient Eligibility Criteria Checklist & Daily Group note for Rec Tx Intervention ? ?AAA/T Program Assumption of Risk Form signed by Patient/ or Parent Legal Guardian YES ? ?Patient understands their participation is voluntary YES ? ?Group Description: Patients provided opportunity to interact with trained and credentialed [employer] [facility] dog and the community volunteer/dog handler. Patients practiced appropriate animal interaction and were educated on dog safety outside of the hospital in common community settings. Patients were allowed to use dog toys and other items to practice commands, engage the dog in play, and/or complete routine aspects of animal care.  ? ?Education: Charity fundraiser, Health visitor, Communication & Social Skills  ? ? ?Affect/Mood: Appropriate ?  ?Participation Level: Engaged ?  ? ?Clinical Observations/Individualized Feedback: Pt was engaged and appropriate during group session.  Pt asked questions of the dog team trainer and shared stories of their pets at home.   ?  ? ?Plan: Continue to engage patient in RT group sessions 2-3x/week. ? ? ?Caroll Rancher, LRT,CTRS  ?01/26/2022 3:29 PM ?

## 2022-01-26 NOTE — Progress Notes (Signed)
[facility] MD Progress Note ? ?01/26/2022 3:45 PM ?Barbara Gordon  ?MRN:  [id number] ? ?Chief Complaint: paranoia/psychosis ? ?Reason for Admission:  ?Barbara Gordon is a 31 y.o. female with a history of TBI [facility], epidural hematoma, and temporal skull fracture in 2021), previous polysubstance abuse, previous diagnoses of bipolar d/o vs substance induced mood d/o vs schizoaffective d/o, previous suicide attempt via OD, and previous treatment of psychosis, who was initially admitted under IVC on 01/18/2022 for management of self-care deficits, bizarre behaviors, and paranoia. The patient is currently on Hospital Day 8.  ? ?Chart Review from last 24 hours:  ?The patient's chart was reviewed and nursing notes were reviewed. The patient's case was discussed in multidisciplinary team meeting.  Per nursing, the patient appears flat and anxious. She did well with goal setting and pet therapy group today.  ? ?Information Obtained Today During Patient Interview: ?The patient was seen and evaluated on the unit.  She explained that she had a TBI about 2 years ago. She can't exactly recall how it happened though. Since then she has been sleeping about 12 hours out of every 24 at home. She has been sleeping about that here as well. She has low energy. She has not noted any headache or dizziness. No tremors or seizures. She does have problems with her memory, subjectively. She finds that she is bored. She appears apathetic in general with regards as to why she is here, goals of care. She is eating regularly. No thoughts of harm to self or others. No hallucinations, apparent delusions or paranoia.   ? ?MoCA score 01/25/22 is a 23 out of 30.  She had difficulty with visual-spatial executive function with trail making, had difficulty with attention in trying to repeat numbers backwards, had difficulty with serial subtractions and could not name more than 6 words that start with the letter F in terms of fluency of language had difficulty with  recall and could only recall 1 of 5 words with delayed recall. ? ?Principal Problem: 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HCC) ?Diagnosis: Principal Problem: ?  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HCC) ?Active Problems: ?  History of seizure ?  Cannabis use disorder, severe, dependence (HCC) ?  Borderline personality disorder (HCC) ?  Cigarette nicotine dependence without complication ? ?Total Time Spent in Direct Patient Care:  ?I personally spent 40 minutes on the unit in direct patient care. The direct patient care time included face-to-face time with the patient, reviewing the patient's chart, communicating with other professionals, and coordinating care. Greater than 50% of this time was spent in counseling or coordinating care with the patient regarding goals of hospitalization, psycho-education, and discharge planning needs. ? ?Past Psychiatric History: see H&P ? ?Past Medical History: see H&P ? ?Family History: see H&P ? ?Family Psychiatric  History: see H&P ? ?Social History:  ?Social History  ? ?Substance and Sexual Activity  ?Alcohol Use None  ?   ?Social History  ? ?Substance and Sexual Activity  ?Drug Use Never  ?  ?Social History  ? ?Socioeconomic History  ? Marital status: Single  ?  Spouse name: Not on file  ? Number of children: Not on file  ? Years of education: Not on file  ? Highest education level: Not on file  ?Occupational History  ? Not on file  ?Tobacco Use  ? Smoking status: Some Days  ?  Types: Cigarettes  ? Smokeless tobacco: Never  ?Substance and  Sexual Activity  ? Alcohol use: Not on file  ? Drug use: Never  ? Sexual activity: Never  ?Other Topics Concern  ? Not on file  ?Social History Narrative  ? Not on file  ? ?Social Determinants of Health  ? ?Financial Resource Strain: Not on file  ?Food Insecurity: Not on file  ?Transportation Needs: Not on file  ?Physical Activity: Not on file  ?Stress: Not  on file  ?Social Connections: Not on file  ? ?Sleep: Good ? ?Appetite:  Good ? ?Current Medications: ?Current Facility-Administered Medications  ?Medication Dose Route Frequency Provider Last Rate Last Admin  ? acetaminophen (TYLENOL) tablet 650 mg  650 mg Oral Q6H PRN Ardis Hughs, NP      ? alum & mag hydroxide-simeth (MAALOX/MYLANTA) 200-200-20 MG/5ML suspension 30 mL  30 mL Oral Q4H PRN Ardis Hughs, NP      ? benztropine (COGENTIN) tablet 0.5 mg  0.5 mg Oral BID PRN Comer Locket, MD      ? feeding supplement (ENSURE ENLIVE / ENSURE PLUS) liquid 237 mL  237 mL Oral BID BM Massengill, Harrold Donath, MD   237 mL at 01/26/22 1451  ? FLUoxetine (PROZAC) capsule 10 mg  10 mg Oral Daily Mason Jim, Amy E, MD   10 mg at 01/26/22 6283  ? haloperidol (HALDOL) tablet 5 mg  5 mg Oral BID Comer Locket, MD   5 mg at 01/26/22 6629  ? lamoTRIgine (LAMICTAL) tablet 25 mg  25 mg Oral Daily Comer Locket, MD   25 mg at 01/26/22 4765  ? [START ON 02/03/2022] lamoTRIgine (LAMICTAL) tablet 50 mg  50 mg Oral Daily Singleton, Amy E, MD      ? OLANZapine zydis (ZYPREXA) disintegrating tablet 10 mg  10 mg Oral Q8H PRN Ardis Hughs, NP   10 mg at 01/26/22 4650  ? And  ? LORazepam (ATIVAN) tablet 1 mg  1 mg Oral PRN Ardis Hughs, NP      ? And  ? ziprasidone (GEODON) injection 20 mg  20 mg Intramuscular PRN Ardis Hughs, NP      ? magnesium hydroxide (MILK OF MAGNESIA) suspension 30 mL  30 mL Oral Daily PRN Ardis Hughs, NP      ? multivitamin with minerals tablet 1 tablet  1 tablet Oral Daily Comer Locket, MD   1 tablet at 01/26/22 3546  ? nicotine polacrilex (NICORETTE) gum 2 mg  2 mg Oral PRN Phineas Inches, MD   2 mg at 01/25/22 1137  ? OLANZapine (ZYPREXA) tablet 20 mg  20 mg Oral QHS Comer Locket, MD   20 mg at 01/25/22 2201  ? thiamine tablet 100 mg  100 mg Oral Daily Bartholomew Crews E, MD   100 mg at 01/26/22 5681  ? traZODone (DESYREL) tablet 50 mg  50 mg Oral QHS PRN Ardis Hughs, NP      ? Vitamin D (Ergocalciferol) (DRISDOL) capsule 50,000 Units  50,000 Units Oral Q7 days Comer Locket, MD   50,000 Units at 01/22/22 2751  ? ? ?Lab Results:  ?No results found for this or any previous visit (from the past 48 hour(s)). ? ? ?Blood Alcohol level:  ?Lab Results  ?Component Value Date  ? ETH <10 01/15/2022  ? ETH <10 01/08/2022  ? ? ?Metabolic Disorder Labs: ?Lab Results  ?Component Value Date  ? HGBA1C 5.0 01/15/2022  ? MPG 97 01/15/2022  ? MPG 96.8 10/09/2021  ? ?  Lab Results  ?Component Value Date  ? PROLACTIN 2.6 (L) 01/15/2022  ? ?Lab Results  ?Component Value Date  ? CHOL 171 01/15/2022  ? TRIG 10 01/15/2022  ? HDL 36 (L) 01/15/2022  ? CHOLHDL 4.8 01/15/2022  ? VLDL 2 01/15/2022  ? LDLCALC 133 (H) 01/15/2022  ? LDLCALC 131 (H) 10/09/2021  ? ? ?Physical Findings: ?CIWA:  CIWA-Ar Total: 3 ? ?Musculoskeletal: ?Strength & Muscle Tone: normal ?Gait & Station: steady, normal ?Patient leans: N/A ? ?Psychiatric Specialty Exam: ? ?Presentation  ?General Appearance: Hair reasonably combed, casual clothing.  ? ?Eye Contact: fair ? ?Speech:clear and coherent, soft, minimal ? ?Speech Volume:Normal ? ?Mood and Affect  ?Mood:aloof, ambivalent, ? ?Affect:constricted, apathetic ? ? ?Thought Process  ?Thought Processes: concrete with poverty of thought and no elaboration on answers  ? ?Orientation:oriented to month, date, year, city and place but not situation ? ?Thought Content:Has poverty of thought; denies AVH, paranoia, ideas of reference or first rank symptoms - is not grossly responding to internal stimuli, no apparent paranoia ? ?Hallucinations:Denied ? ?Ideas of Reference:Denied ? ?Suicidal Thoughts:Denied ? ?Homicidal Thoughts:Denied ? ? ?Sensorium  ?Memory:Poor  ? ?Judgment:Poor ? ?Insight:Poor ? ? ?Executive Functions  ?Concentration:Fair ? ?Attention Span: Difficulty with serial subtractions - Fair attention  ? ?Recall:Poor- only can recall of 1 word of 5 with delayed recall  testing ? ?Fund of Knowledge:Fair - MOCA 23/30 ? ?Language:Fair ? ? ?Psychomotor Activity  ?Psychomotor Activity: Decreased globally but fidgety at times during interview ? ? ?Assets  ?Assets:Resilience ? ? ?Sleep

## 2022-01-26 NOTE — Group Note (Signed)
Date:  01/26/2022 ?Time:  11:35 AM ? ?Group Topic/Focus:  ?Goals Group:   The focus of this group is to help patients establish daily goals to achieve during treatment and discuss how the patient can incorporate goal setting into their daily lives to aide in recovery. ? ? ? ?Participation Level:  Active ? ?Participation Quality:  Appropriate ? ?Affect:  Appropriate ? ?Cognitive:  Appropriate ? ?Insight: Appropriate ? ?Engagement in Group:  Engaged ? ?Modes of Intervention:  Discussion ? ?Additional Comments:  Patient stated she was doing better and working towards discharged. ? ?[name] ?01/26/2022, 11:35 AM ? ?

## 2022-01-27 LAB — RESP PANEL BY RT-PCR (FLU A&B, COVID) ARPGX2
Influenza A by PCR: NEGATIVE
Influenza B by PCR: NEGATIVE
SARS Coronavirus 2 by RT PCR: POSITIVE — AB

## 2022-01-27 MED ORDER — HALOPERIDOL 5 MG PO TABS
10.0000 mg | ORAL_TABLET | Freq: Every day | ORAL | Status: DC
  Administered 2022-01-28 – 2022-01-29 (×2): 10 mg via ORAL
  Filled 2022-01-27: qty 2
  Filled 2022-01-27: qty 14
  Filled 2022-01-27 (×2): qty 2

## 2022-01-27 MED ORDER — HALOPERIDOL 5 MG PO TABS
5.0000 mg | ORAL_TABLET | Freq: Every day | ORAL | Status: AC
  Administered 2022-01-27: 5 mg via ORAL
  Filled 2022-01-27: qty 1

## 2022-01-27 MED ORDER — LIOTHYRONINE SODIUM 25 MCG PO TABS
25.0000 ug | ORAL_TABLET | Freq: Every day | ORAL | Status: DC
  Administered 2022-01-27 – 2022-01-30 (×4): 25 ug via ORAL
  Filled 2022-01-27: qty 7
  Filled 2022-01-27 (×5): qty 1

## 2022-01-27 MED ORDER — OLANZAPINE 10 MG PO TABS
10.0000 mg | ORAL_TABLET | Freq: Every day | ORAL | Status: DC
  Administered 2022-01-27: 10 mg via ORAL
  Filled 2022-01-27 (×3): qty 1

## 2022-01-27 NOTE — Progress Notes (Signed)
D:  Patient denied SI and HI, contracts for safety.  Denied A/V hallucinations.  Denied pain. ?A:  Medications administered per MD orders.  Emotional support and encouragement given patient. ?R:  Safety maintained with 15 minute checks.  Patient continues to lay in bed sleeping. ? ? ?

## 2022-01-27 NOTE — Group Note (Signed)
[facility] LCSW Group Therapy ? ? ?Type of Therapy and Topic:  Group Therapy:  Wellness ? ? ?Participation Level: Active ? ?Description of Group: ?This group allows individuals to explore the 6 dimensions of wellness, including spiritual, emotional, intellectual, physical, social, environmental, financial and spiritual. Patients will learn to different ways to practice wellness to improve well-being. Patients also participated in a conversation about what wellness means to them.   Individuals will think about ways in which they currently practice wellness as well as ways they can improve their wellness and new ways to practice wellness.   ? ? ?  ?Therapeutic Goals ?Patient will verbalize 1 pr 2 we;;mess areas where they are doing well. ?Patient will identify 2 areas where they would like to improve their wellness.   ?Patient will provide a definition of what wellness means to them.  ?Patients will reflect on current hospitalization and primary areas to maintain mental health to prevent re-hospitalization.  ?  ? ?Summary of Patient Progress:  Due to sickness on the unit, group was held individually.  CSW met with patient individually and went over topic information. CSW encouraged patient to participate in activities and worksheets associated with group topic.  Patient agreed to find CSW if they would like to discuss and talk about topic.   ? ?  ?  ? ?Therapeutic Modalities ?Cognitive Behavioral Therapy ?Motivational Interviewing ? ? ?Trentyn Boisclair, LCSW, LCAS ?Clincal Social Worker  ?[facility] ? ? ? ?

## 2022-01-27 NOTE — Group Note (Signed)
Recreation Therapy Group Note ? ? ?Group Topic:Stress Management  ?Group Date: 01/27/2022 ?Start Time: 0930 ?End Time: 0945 ?Facilitators: Caroll Rancher, LRT,CTRS ?Location: [address] Day[address] ? ? ?Goal Area(s) Addresses:  ?Patient will actively participate in stress management techniques presented during session.  ?Patient will successfully identify benefit of practicing stress management post d/c.  ? ?Group Description: Guided Imagery. LRT provided education, instruction, and demonstration on practice of visualization via guided imagery. Patient was asked to participate in the technique introduced during session. LRT debriefed including topics of mindfulness, stress management and specific scenarios each patient could use these techniques. Patients were given suggestions of ways to access scripts post d/c and encouraged to explore Youtube and other apps available on smartphones, tablets, and computers. ? ? ?Affect/Mood: Appropriate ?  ?Participation Level: Moderate ?  ?Participation Quality: Independent ?  ?Behavior: Appropriate ?  ?Speech/Thought Process: Unfocused ?  ?Insight: Moderate ?  ?Judgement: Moderate ?  ?Modes of Intervention: Script; Ambient Sounds ?  ?Patient Response to Interventions:  Receptive ?  ?Education Outcome: ? Acknowledges education and In group clarification offered   ? ?Clinical Observations/Individualized Feedback: Pt came in late to group.  Pt participated for a few minutes before leaving and not returning.  ? ? ?Plan: Continue to engage patient in RT group sessions 2-3x/week. ? ? ?Caroll Rancher, LRT,CTRS ?01/27/2022 10:46 AM ?

## 2022-01-27 NOTE — Progress Notes (Signed)
Patient did not attend wrap up group. 

## 2022-01-27 NOTE — Progress Notes (Addendum)
[facility] MD Progress Note ? ?01/27/2022 10:54 AM ?[name]  ?MRN:  [id number] ?Subjective:  Barbara Gordon is a 31 y.o. female with a history of TBI ([facility], epidural hematoma, and temporal skull fracture in 2021), previous polysubstance abuse, previous diagnoses of bipolar d/o vs substance induced mood d/o vs schizoaffective d/o, previous suicide attempt via OD, and previous treatment of psychosis, who was initially admitted under IVC on 01/18/2022 for management of self-care deficits, bizarre behaviors, and paranoia. The patient is currently on Hospital Day 8.  ? ?Case was discussed in the multidisciplinary team. MAR was reviewed and patient was compliant with medications.  She did not require any PRN's for agitation. Patient is participating in groups.  ?  ?  ?Psychiatric Team made the following recommendations yesterday:  ?-- Change to Zyprexa 15mg  qhs starting tonight to reduce daytime sedation- hope to taper off as Haldol becomes more therapeutic ?-- ContinueHaldol 5 mg to bid with goal of titrating up as tolerated and onto monotherapy as tolerated ?-- Cogentin 0.5mg  bid PRN EPS ?            -- In the event that she has MDD with psychotic features with recent report of depressed mood will start Prozac 10mg  daily and monitor - attempted to discuss r/b/se/a but patient could not engage in meaningful discussion today- will need more psychoeducation about med as she continues to clear ? ?On assessment today patient endorses that she feels better but notes that she is feeling more tired. Patient reports that she cannot recall why she was hospitalized but endorses that she remembers her thoughts feeling that they were racing. Patient reports that she feels that the medications has helped slow her thoughts down to a more normal place. Patient denies feeling that her thoughts are loud and endorses that she feels safe on the unit. Patient endorses that she does not recall why her roommate got her psych help, but when  provider endorses that he was worried about her behavior patient reports that she could believe this. Patient reports that she would like to go back to live with the roommate but she is worried that he will not accept her. Patient reports that she will reach out to him today. Patient reports that she is not sure why she has not spoken to him since she was hospitalized. Patient denies SI, HI or AVH. Patient denies feeling that her thoughts are "cloudy." Patient denies having adverse side effects from medication changes, but endorses that she is having some abdominal pain today without nausea, emesis, or diarrhea. ?Principal Problem: 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HCC) ?Diagnosis: Principal Problem: ?  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HCC) ?Active Problems: ?  History of seizure ?  Cannabis use disorder, severe, dependence (HCC) ?  Borderline personality disorder (HCC) ?  Cigarette nicotine dependence without complication ? ?Total Time spent with patient: 20 minutes ? ?Past Psychiatric History: See H&P ? ?Past Medical History: History reviewed. No pertinent past medical history. History reviewed. No pertinent surgical history. ?Family History: History reviewed. No pertinent family history. ?Family Psychiatric  History:  See H&P ?Social History:  ?Social History  ? ?Substance and Sexual Activity  ?Alcohol Use None  ?   ?Social History  ? ?Substance and Sexual Activity  ?Drug Use Never  ?  ?Social History  ? ?Socioeconomic History  ? Marital status: Single  ?  Spouse name: Not on file  ? Number of children: Not  on file  ? Years of education: Not on file  ? Highest education level: Not on file  ?Occupational History  ? Not on file  ?Tobacco Use  ? Smoking status: Some Days  ?  Types: Cigarettes  ? Smokeless tobacco: Never  ?Substance and Sexual Activity  ? Alcohol use: Not on file  ? Drug use: Never  ? Sexual activity: Never   ?Other Topics Concern  ? Not on file  ?Social History Narrative  ? Not on file  ? ?Social Determinants of Health  ? ?Financial Resource Strain: Not on file  ?Food Insecurity: Not on file  ?Transportation Needs: Not on file  ?Physical Activity: Not on file  ?Stress: Not on file  ?Social Connections: Not on file  ? ?Additional Social History:  ?  ?  ?  ?  ?  ?  ?  ?  ?  ?  ?  ? ?Sleep: Good (but hypersomnia) ? ?Appetite:  Fair ? ?Current Medications: ?Current Facility-Administered Medications  ?Medication Dose Route Frequency Provider Last Rate Last Admin  ? acetaminophen (TYLENOL) tablet 650 mg  650 mg Oral Q6H PRN Ardis Hughs, NP      ? alum & mag hydroxide-simeth (MAALOX/MYLANTA) 200-200-20 MG/5ML suspension 30 mL  30 mL Oral Q4H PRN Ardis Hughs, NP      ? benztropine (COGENTIN) tablet 0.5 mg  0.5 mg Oral BID PRN Comer Locket, MD      ? feeding supplement (ENSURE ENLIVE / ENSURE PLUS) liquid 237 mL  237 mL Oral BID BM Phineas Inches, MD   237 mL at 01/27/22 0947  ? FLUoxetine (PROZAC) capsule 10 mg  10 mg Oral Daily Comer Locket, MD   10 mg at 01/27/22 8250  ? [START ON 01/28/2022] haloperidol (HALDOL) tablet 10 mg  10 mg Oral QHS Lysle Yero, Gerlean Ren B, MD      ? haloperidol (HALDOL) tablet 5 mg  5 mg Oral QHS Eliseo Gum B, MD      ? lamoTRIgine (LAMICTAL) tablet 25 mg  25 mg Oral Daily Comer Locket, MD   25 mg at 01/27/22 0856  ? [START ON 02/03/2022] lamoTRIgine (LAMICTAL) tablet 50 mg  50 mg Oral Daily Mason Jim, Amy E, MD      ? liothyronine (CYTOMEL) tablet 25 mcg  25 mcg Oral Daily Eliseo Gum B, MD      ? OLANZapine zydis (ZYPREXA) disintegrating tablet 10 mg  10 mg Oral Q8H PRN Ardis Hughs, NP   10 mg at 01/26/22 5397  ? And  ? LORazepam (ATIVAN) tablet 1 mg  1 mg Oral PRN Ardis Hughs, NP      ? And  ? ziprasidone (GEODON) injection 20 mg  20 mg Intramuscular PRN Ardis Hughs, NP      ? magnesium hydroxide (MILK OF MAGNESIA) suspension 30 mL  30 mL Oral Daily  PRN Ardis Hughs, NP      ? multivitamin with minerals tablet 1 tablet  1 tablet Oral Daily Comer Locket, MD   1 tablet at 01/27/22 6734  ? nicotine polacrilex (NICORETTE) gum 2 mg  2 mg Oral PRN Phineas Inches, MD   2 mg at 01/27/22 0946  ? OLANZapine (ZYPREXA) tablet 10 mg  10 mg Oral QHS Eliseo Gum B, MD      ? thiamine tablet 100 mg  100 mg Oral Daily Mason Jim, Amy E, MD   100 mg at 01/27/22 0856  ? traZODone (DESYREL)  tablet 50 mg  50 mg Oral QHS PRN Ardis Hughsoleman, Carolyn H, NP      ? Vitamin D (Ergocalciferol) (DRISDOL) capsule 50,000 Units  50,000 Units Oral Q7 days Comer LocketSingleton, Amy E, MD   50,000 Units at 01/22/22 16100746  ? ? ?Lab Results: No results found for this or any previous visit (from the past 48 hour(s)). ? ?Blood Alcohol level:  ?Lab Results  ?Component Value Date  ? ETH <10 01/15/2022  ? ETH <10 01/08/2022  ? ? ?Metabolic Disorder Labs: ?Lab Results  ?Component Value Date  ? HGBA1C 5.0 01/15/2022  ? MPG 97 01/15/2022  ? MPG 96.8 10/09/2021  ? ?Lab Results  ?Component Value Date  ? PROLACTIN 2.6 (L) 01/15/2022  ? ?Lab Results  ?Component Value Date  ? CHOL 171 01/15/2022  ? TRIG 10 01/15/2022  ? HDL 36 (L) 01/15/2022  ? CHOLHDL 4.8 01/15/2022  ? VLDL 2 01/15/2022  ? LDLCALC 133 (H) 01/15/2022  ? LDLCALC 131 (H) 10/09/2021  ? ? ?Physical Findings: ?AIMS: Facial and Oral Movements ?Muscles of Facial Expression: None, normal ?Lips and Perioral Area: None, normal ?Jaw: None, normal ?Tongue: None, normal,Extremity Movements ?Upper (arms, wrists, hands, fingers): None, normal ?Lower (legs, knees, ankles, toes): None, normal, Trunk Movements ?Neck, shoulders, hips: None, normal, Overall Severity ?Severity of abnormal movements (highest score from questions above): None, normal ?Incapacitation due to abnormal movements: None, normal ?Patient's awareness of abnormal movements (rate only patient's report): No Awareness, Dental Status ?Current problems with teeth and/or dentures?: No ?Does patient  usually wear dentures?: No  ?CIWA:  CIWA-Ar Total: 3 ?COWS:    ? ?Musculoskeletal: ?Strength & Muscle Tone: within normal limits ?Gait & Station:  remains in bed on assessment ?Patient leans: N/A ? ?Psychiat

## 2022-01-27 NOTE — [facility] Counselor (Signed)
CSW met with patient to discuss discharge plans.  Patient reports that she didn't know what she wanted to do after discharge.  Patient agreed she would call contact, [name], regarding staying with him.  CSW also provided a list of shelter resources for her to call as alternative shelter options. ? ? ?Teddy Pena, LCSW, LCAS ?Clincal Social Worker  ?[facility] ? ?

## 2022-01-27 NOTE — [facility] Counselor (Signed)
CSW met with patient friend, [name], and discussed patient disposition.  CSW updated friend on current progress and plans for discharge. [name] expressed concern about patient ability to function in a shelter. He also discussed his inability to financially support another human being in his household but wanting to be able to support her in whatever way possible.  CSW presented shelter options to friend and discussed CSW's encouragement to patient to call friend.  Friend asked for CSW to continue to encourage patient to call him.  CSW agreed that they would review case with doctor and discuss a referral for applying for disability as well as a potential APS report due to concerns about patient being able to function independently.  ? ?CSW met with patient, provided patient her pin number and phone number to friend and assisted her to phone to call friend.  ? ? ?Romen Yutzy, LCSW, LCAS ?Clincal Social Worker  ?[facility] ? ?

## 2022-01-28 LAB — HEPATITIS PANEL, ACUTE
HCV Ab: REACTIVE — AB
Hep A IgM: NONREACTIVE
Hep B C IgM: NONREACTIVE
Hepatitis B Surface Ag: NONREACTIVE

## 2022-01-28 MED ORDER — OLANZAPINE 5 MG PO TABS
5.0000 mg | ORAL_TABLET | Freq: Every day | ORAL | Status: DC
Start: 2022-01-28 — End: 2022-01-29
  Administered 2022-01-28: 5 mg via ORAL
  Filled 2022-01-28 (×4): qty 1

## 2022-01-28 NOTE — Progress Notes (Signed)
?   01/28/22 2000  ?Psych Admission Type (Psych Patients Only)  ?Admission Status Involuntary  ?Psychosocial Assessment  ?Patient Complaints Depression;Isolation  ?Eye Contact Fair  ?Facial Expression Flat  ?Affect Depressed  ?Speech Soft  ?Interaction Guarded  ?Motor Activity Slow  ?Appearance/Hygiene Disheveled  ?Behavior Characteristics Cooperative  ?Mood Anxious;Depressed  ?Aggressive Behavior  ?Effect No apparent injury  ?Thought Process  ?Coherency Blocking  ?Content WDL  ?Delusions None reported or observed  ?Perception WDL  ?Hallucination None reported or observed  ?Judgment Poor  ?Confusion None  ?Danger to Self  ?Current suicidal ideation? Denies  ?Danger to Others  ?Danger to Others None reported or observed  ? ? ?

## 2022-01-28 NOTE — Progress Notes (Signed)
D- Patient alert and oriented x4. Denies SI, HI, AVH. Pt remains in airborne/contact precautions. PT educated on precautions. Pt isolative and guarded prior to being on unit restrictions. Pt  is minimal and superficial.  ?  ?A- Scheduled medications administered to patient, per MD orders.Routine safety checks conducted every 15 minutes.  Patient informed to notify staff with problems or concerns. ?  ?R- Patient compliant with medications and verbalized understanding treatment plan. Patient receptive, calm, and cooperative. ? ? 01/27/22 2200  ?Psych Admission Type (Psych Patients Only)  ?Admission Status Involuntary  ?Psychosocial Assessment  ?Patient Complaints Depression;Isolation  ?Eye Contact Brief  ?Facial Expression Flat  ?Affect Depressed  ?Speech Slow;Soft  ?Interaction Guarded;Isolative  ?Motor Activity Slow  ?Appearance/Hygiene Poor hygiene;Disheveled  ?Behavior Characteristics Guarded  ?Mood Depressed  ?Thought Process  ?Coherency Blocking  ?Content WDL  ?Delusions None reported or observed  ?Perception WDL  ?Hallucination None reported or observed  ?Judgment Poor  ?Confusion None  ?Danger to Self  ?Current suicidal ideation? Denies  ?Danger to Others  ?Danger to Others None reported or observed  ? ? ?

## 2022-01-28 NOTE — Progress Notes (Signed)
[facility] MD Progress Note ? ?01/28/2022 2:24 PM ?Barbara Gordon  ?MRN:  [id number] ?Subjective:   Barbara Gordon is a 31 y.o. female with a history of TBI (SAH, epidural hematoma, and temporal skull fracture in 2021), previous polysubstance abuse, previous diagnoses of bipolar d/o vs substance induced mood d/o vs schizoaffective d/o, previous suicide attempt via OD, and previous treatment of psychosis, who was initially admitted under IVC on 01/18/2022 for management of self-care deficits, bizarre behaviors, and paranoia. The patient is currently on Hospital Day 8.  ?  ?Case was discussed in the multidisciplinary team. MAR was reviewed and patient was compliant with medications.  She did not require any PRN's for agitation. Patient is participating in groups.  ?  ?  ?Psychiatric Team made the following recommendations yesterday:  ?-- Change to Zyprexa 10mg  qhs starting tonight to reduce daytime sedation- hope to taper off as Haldol becomes more therapeutic ?-- Change haldol to 10mg  QHS with goal of titrating up as tolerated and onto monotherapy as tolerated ?-- Cogentin 0.5mg  bid PRN EPS ?        -- Start Cytomel , for hx TBI presenting hypersomnic and anergic  ? ?Patient reports that she is sleeping and eating ok. Patient continues to not do much during the day but keep her room, dark and sleep in her bed. Patient reports that she continues to feel very tired, but she is happy that she is no longer hearing voices. Patient denies SI, HI and AVH today. Patient reports that she did call her roommate yesterday and is not sure why she had been avoiding talking to him, but she reports that the phone call went well.  ?Patient does not believe she is having any adverse side effects from her medication. ? ?Principal Problem: 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HCC) ?Diagnosis: Principal Problem: ?  Schizophrenia spectrum disorder with psychotic disorder type not yet determined  (HCC) ?Active Problems: ?  History of seizure ?  Cannabis use disorder, severe, dependence (HCC) ?  Borderline personality disorder (HCC) ?  Cigarette nicotine dependence without complication ? ?Total Time spent with patient: 20 minutes ? ?Past Psychiatric History:  See H&P ? ?Past Medical History: History reviewed. No pertinent past medical history. History reviewed. No pertinent surgical history. ?Family History: History reviewed. No pertinent family history. ?Family Psychiatric  History:  See h&p ?Social History:  ?Social History  ? ?Substance and Sexual Activity  ?Alcohol Use None  ?   ?Social History  ? ?Substance and Sexual Activity  ?Drug Use Never  ?  ?Social History  ? ?Socioeconomic History  ? Marital status: Single  ?  Spouse name: Not on file  ? Number of children: Not on file  ? Years of education: Not on file  ? Highest education level: Not on file  ?Occupational History  ? Not on file  ?Tobacco Use  ? Smoking status: Some Days  ?  Types: Cigarettes  ? Smokeless tobacco: Never  ?Substance and Sexual Activity  ? Alcohol use: Not on file  ? Drug use: Never  ? Sexual activity: Never  ?Other Topics Concern  ? Not on file  ?Social History Narrative  ? Not on file  ? ?Social Determinants of Health  ? ?Financial Resource Strain: Not on file  ?Food Insecurity: Not on file  ?Transportation Needs: Not on file  ?Physical Activity: Not on file  ?Stress: Not on file  ?Social Connections: Not on file  ? ?Additional Social History:  ?  ?  ?  ?  ?  ?  ?  ?  ?  ?  ?  ? ?  Sleep: Fair ? ?Appetite:  Fair ? ?Current Medications: ?Current Facility-Administered Medications  ?Medication Dose Route Frequency Provider Last Rate Last Admin  ? acetaminophen (TYLENOL) tablet 650 mg  650 mg Oral Q6H PRN Ardis Hughs, NP      ? alum & mag hydroxide-simeth (MAALOX/MYLANTA) 200-200-20 MG/5ML suspension 30 mL  30 mL Oral Q4H PRN Ardis Hughs, NP      ? benztropine (COGENTIN) tablet 0.5 mg  0.5 mg Oral BID PRN Comer Locket, MD      ? feeding supplement (ENSURE ENLIVE / ENSURE PLUS) liquid 237 mL  237 mL Oral BID BM Phineas Inches, MD   237 mL at 01/28/22 0941  ? FLUoxetine (PROZAC) capsule 10 mg  10 mg Oral Daily Mason Jim, Amy E, MD   10 mg at 01/28/22 0900  ? haloperidol (HALDOL) tablet 10 mg  10 mg Oral QHS Eliseo Gum B, MD      ? lamoTRIgine (LAMICTAL) tablet 25 mg  25 mg Oral Daily Mason Jim, Amy E, MD   25 mg at 01/28/22 0900  ? [START ON 02/03/2022] lamoTRIgine (LAMICTAL) tablet 50 mg  50 mg Oral Daily Mason Jim, Amy E, MD      ? liothyronine (CYTOMEL) tablet 25 mcg  25 mcg Oral Daily Eliseo Gum B, MD   25 mcg at 01/28/22 0900  ? OLANZapine zydis (ZYPREXA) disintegrating tablet 10 mg  10 mg Oral Q8H PRN Ardis Hughs, NP   10 mg at 01/26/22 5170  ? And  ? LORazepam (ATIVAN) tablet 1 mg  1 mg Oral PRN Ardis Hughs, NP      ? And  ? ziprasidone (GEODON) injection 20 mg  20 mg Intramuscular PRN Ardis Hughs, NP      ? magnesium hydroxide (MILK OF MAGNESIA) suspension 30 mL  30 mL Oral Daily PRN Ardis Hughs, NP      ? multivitamin with minerals tablet 1 tablet  1 tablet Oral Daily Mason Jim, Amy E, MD   1 tablet at 01/28/22 0900  ? nicotine polacrilex (NICORETTE) gum 2 mg  2 mg Oral PRN Phineas Inches, MD   2 mg at 01/27/22 0946  ? OLANZapine (ZYPREXA) tablet 10 mg  10 mg Oral QHS Eliseo Gum B, MD   10 mg at 01/27/22 2159  ? thiamine tablet 100 mg  100 mg Oral Daily Mason Jim, Amy E, MD   100 mg at 01/28/22 0900  ? traZODone (DESYREL) tablet 50 mg  50 mg Oral QHS PRN Ardis Hughs, NP      ? Vitamin D (Ergocalciferol) (DRISDOL) capsule 50,000 Units  50,000 Units Oral Q7 days Comer Locket, MD   50,000 Units at 01/22/22 0174  ? ? ?Lab Results:  ?Results for orders placed or performed during the hospital encounter of 01/18/22 (from the past 48 hour(s))  ?Resp Panel by RT-PCR (Flu A&B, Covid) Nasopharyngeal Swab     Status: Abnormal  ? Collection Time: 01/27/22  1:16 PM  ? Specimen:  Nasopharyngeal Swab; Nasopharyngeal(NP) swabs in vial transport medium  ?Result Value Ref Range  ? SARS Coronavirus 2 by RT PCR POSITIVE (A) NEGATIVE  ?  Comment: (NOTE) ?SARS-CoV-2 target nucleic acids are DETECTED. ? ?The SARS-CoV-2 RNA is generally detectable in upper respiratory ?specimens during the acute phase of infection. Positive results are ?indicative of the presence of the identified virus, but do not rule ?out bacterial infection or co-infection with other pathogens not ?detected by the test. Clinical correlation with  patient history and ?other diagnostic information is necessary to determine patient ?infection status. The expected result is Negative. ? ?Fact Sheet for Patients: ?BloggerCourse.comhttps://www.fda.gov/media/152166/download ? ?Fact Sheet for Healthcare Providers: ?SeriousBroker.ithttps://www.fda.gov/media/152162/download ? ?This test is not yet approved or cleared by the [place] FDA and  ?has been authorized for detection and/or diagnosis of SARS-CoV-2 by ?FDA under an Emergency Use Authorization (EUA).  This EUA will ?remain in effect (meaning this test can be used) for the duration of  ?the COVID-19 declaration under Section 564(b)(1) of the A ct, 21 ?U.S.C. section 360bbb-3(b)(1), unless the authorization is ?terminated or revoked sooner. ? ?  ? Influenza A by PCR NEGATIVE NEGATIVE  ? Influenza B by PCR NEGATIVE NEGATIVE  ?  Comment: (NOTE) ?The Xpert Xpress SARS-CoV-2/FLU/RSV plus assay is intended as an aid ?in the diagnosis of influenza from Nasopharyngeal swab specimens and ?should not be used as a sole basis for treatment. Nasal washings and ?aspirates are unacceptable for Xpert Xpress SARS-CoV-2/FLU/RSV ?testing. ? ?Fact Sheet for Patients: ?BloggerCourse.comhttps://www.fda.gov/media/152166/download ? ?Fact Sheet for Healthcare Providers: ?SeriousBroker.ithttps://www.fda.gov/media/152162/download ? ?This test is not yet approved or cleared by the [place] FDA and ?has been authorized for detection and/or diagnosis of SARS-CoV-2  by ?FDA under an Emergency Use Authorization (EUA). This EUA will remain ?in effect (meaning this test can be used) for the duration of the ?COVID-19 declaration under Section 564(b)(1) of the Act, 21 U.S.C. ?sect

## 2022-01-28 NOTE — Progress Notes (Signed)
CSW spoke with patient friend, [name]. [name] reports that they will have her stay with him for a short time so she can get back on her feet.  CSW notified [name] of services that they have referred patient to including servant center and doing an APS report.  [name] demonstrated understanding and requested that we call him prior to patient discharge so he can pick patient up from the hospital.  ? ? ?Jacqui Headen, LCSW, LCAS ?Clincal Social Worker  ?[facility] ? ?

## 2022-01-28 NOTE — Progress Notes (Signed)
Recreation Therapy Notes ? ? ? ? ?LRT left packets for patients with [facility] that were focused on stress management. ? ? ? ?Barbara Gordon, LRT,CTRS ?Graylee Arutyunyan A ?01/28/2022 10:59 AM ?

## 2022-01-28 NOTE — Plan of Care (Signed)
Nurse discussed anxiety, depression and coping skills with patient.  

## 2022-01-28 NOTE — Plan of Care (Signed)
Patient more alert today ? ?

## 2022-01-28 NOTE — Progress Notes (Signed)
Pt requested Trazodone for sleep, pt educated on the potential of the sedating effect of Haldol and Zyprexa, but pt wanted the Trazodone also, so it was given with HS medications per [facility] ?

## 2022-01-28 NOTE — Progress Notes (Addendum)
D:  Patient denied SI and HI, contracts for safety.  Denied A/V hallucinations.  Denied pain. ?A:  Medications administered per MD orders.  Emotional support and encouragement given patient. ?R:  Safety maintained with 15 minute checks. ? Patient stated she is feeling alittle better than yesterday.   ? ? ?

## 2022-01-28 NOTE — B[facility] Counselor (Addendum)
CSW called APS to provide a report.  CSW left a message for a call back.  ? ?Addendum: APS called back and social worker did an APS report ? ? ?Reha Martinovich, LCSW, LCAS ?Clincal Social Worker  ?[facility] ? ?

## 2022-01-28 NOTE — [facility] Counselor (Signed)
CSW faxed application for assistance to apply for disability to the servant center.  CSW called servant center and Best boy of application that would be faxed there way and provided contact information for the unit to get a hold of patient.  ? ? ? ?Barbara Wuertz, LCSW, LCAS ?Clincal Social Worker  ?[facility] ? ?

## 2022-01-28 NOTE — Plan of Care (Signed)
Nurse discussed coping skills with patient.  

## 2022-01-29 ENCOUNTER — Encounter (HOSPITAL_COMMUNITY): Payer: Self-pay

## 2022-01-29 NOTE — [facility] Counselor (Signed)
CSW spoke with APS worker who met with patient via webex.  Patient participated in assessment and APS worker agreed to meet with patient next week when she is residing with patient friend.  ? ? ?Demeshia Sherburne, LCSW, LCAS ?Clincal Social Worker  ?[facility] ? ?

## 2022-01-29 NOTE — Progress Notes (Signed)
[facility] MD Progress Note ? ?01/29/2022 3:01 PM ?Barbara Gordon  ?MRN:  [id number] ?Subjective:  Barbara Gordon is a 31 y.o. female with a history of TBI (SAH, epidural hematoma, and temporal skull fracture in 2021), previous polysubstance abuse, previous diagnoses of bipolar d/o vs substance induced mood d/o vs schizoaffective d/o, previous suicide attempt via OD, and previous treatment of psychosis, who was initially admitted under IVC on 01/18/2022 for management of self-care deficits, bizarre behaviors, and paranoia. The patient is currently on Hospital Day 8.  ?  ?Case was discussed in the multidisciplinary team. MAR was reviewed and patient was compliant with medications.  She did not require any PRN's for agitation. Patient is in quarantine and has been noted to sleep most of the day. Patient has not done anything in her therapy packets, per RN. Patient was also noted to request for PRN medications for sleep, despite education about patient's daytime sleep habits.  ?  ?  ?Psychiatric Team made the following recommendations yesterday:  ?-- Change to Zyprexa 5mg  qhs starting tonight to reduce daytime sedation- hope to taper off as Haldol becomes more therapeutic ?-- Continue haldol to 10mg  QHS with goal of titrating up as tolerated and onto monotherapy as tolerated ?-- Cogentin 0.5mg  bid PRN EPS ?-- Continue Prozac 10mg  ? ?On assessment this AM patient reports she is still feeling tired, but is resting well at night. Patient reports that she is also eating well and denies feeling physically ill from Covid. Patient endorses that she feels her thoughts are moving at appropriate speeds and she also also denies SI, HI, and AVH. Patient did endorse that she would attempt to complete worksheets in her therapy packet if provided a writing utensil.  ? ?Later in the day patient endorsed that she thinks she becomes a bit more jittery and anxious when she has not had a nicorette gum. Patient endorses that the symptoms resolve  once she gets the gum. Patient was also made aware of her positive Hep C labs and that she will need to f/u OP with a PCP for treatment. Patient endorsed understanding.  ? ?Principal Problem: 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HCC) ?Diagnosis: Principal Problem: ?  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HCC) ?Active Problems: ?  History of seizure ?  Cannabis use disorder, severe, dependence (HCC) ?  Borderline personality disorder (HCC) ?  Cigarette nicotine dependence without complication ? ?Total Time spent with patient: 30 minutes ? ?Past Psychiatric History: See H&P ? ?Past Medical History: History reviewed. No pertinent past medical history. History reviewed. No pertinent surgical history. ?Family History: History reviewed. No pertinent family history. ?Family Psychiatric  History: See H&P ?Social History:  ?Social History  ? ?Substance and Sexual Activity  ?Alcohol Use None  ?   ?Social History  ? ?Substance and Sexual Activity  ?Drug Use Never  ?  ?Social History  ? ?Socioeconomic History  ? Marital status: Single  ?  Spouse name: Not on file  ? Number of children: Not on file  ? Years of education: Not on file  ? Highest education level: Not on file  ?Occupational History  ? Not on file  ?Tobacco Use  ? Smoking status: Some Days  ?  Types: Cigarettes  ? Smokeless tobacco: Never  ?Substance and Sexual Activity  ? Alcohol use: Not on file  ? Drug use: Never  ? Sexual activity: Never  ?Other Topics Concern  ? Not on file  ?  Social History Narrative  ? Not on file  ? ?Social Determinants of Health  ? ?Financial Resource Strain: Not on file  ?Food Insecurity: Not on file  ?Transportation Needs: Not on file  ?Physical Activity: Not on file  ?Stress: Not on file  ?Social Connections: Not on file  ? ?Additional Social History:  ?  ?  ?  ?  ?  ?  ?  ?  ?  ?  ?  ? ?Sleep: Fair ? ?Appetite:  Fair ? ?Current Medications: ?Current  Facility-Administered Medications  ?Medication Dose Route Frequency Provider Last Rate Last Admin  ? acetaminophen (TYLENOL) tablet 650 mg  650 mg Oral Q6H PRN Ardis Hughsoleman, Carolyn H, NP      ? alum & mag hydroxide-simeth (MAALOX/MYLANTA) 200-200-20 MG/5ML suspension 30 mL  30 mL Oral Q4H PRN Ardis Hughsoleman, Carolyn H, NP      ? benztropine (COGENTIN) tablet 0.5 mg  0.5 mg Oral BID PRN Comer LocketSingleton, Amy E, MD   0.5 mg at 01/28/22 2107  ? feeding supplement (ENSURE ENLIVE / ENSURE PLUS) liquid 237 mL  237 mL Oral BID BM Massengill, Harrold DonathNathan, MD   237 mL at 01/29/22 1415  ? FLUoxetine (PROZAC) capsule 10 mg  10 mg Oral Daily Mason JimSingleton, Amy E, MD   10 mg at 01/29/22 [id number]  ? haloperidol (HALDOL) tablet 10 mg  10 mg Oral QHS Eliseo GumMcQuilla, Macarena Langseth B, MD   10 mg at 01/28/22 2107  ? lamoTRIgine (LAMICTAL) tablet 25 mg  25 mg Oral Daily Comer LocketSingleton, Amy E, MD   25 mg at 01/29/22 [id number]  ? [START ON 02/03/2022] lamoTRIgine (LAMICTAL) tablet 50 mg  50 mg Oral Daily Mason JimSingleton, Amy E, MD      ? liothyronine (CYTOMEL) tablet 25 mcg  25 mcg Oral Daily Bobbye MortonMcQuilla, Yecenia Dalgleish B, MD   25 mcg at 01/29/22 0913  ? OLANZapine zydis (ZYPREXA) disintegrating tablet 10 mg  10 mg Oral Q8H PRN Ardis Hughsoleman, Carolyn H, NP   10 mg at 01/26/22 [id number]  ? And  ? LORazepam (ATIVAN) tablet 1 mg  1 mg Oral PRN Ardis Hughsoleman, Carolyn H, NP      ? And  ? ziprasidone (GEODON) injection 20 mg  20 mg Intramuscular PRN Ardis Hughsoleman, Carolyn H, NP      ? magnesium hydroxide (MILK OF MAGNESIA) suspension 30 mL  30 mL Oral Daily PRN Ardis Hughsoleman, Carolyn H, NP      ? multivitamin with minerals tablet 1 tablet  1 tablet Oral Daily Comer LocketSingleton, Amy E, MD   1 tablet at 01/29/22 [id number]  ? nicotine polacrilex (NICORETTE) gum 2 mg  2 mg Oral PRN Phineas InchesMassengill, Nathan, MD   2 mg at 01/29/22 1414  ? thiamine tablet 100 mg  100 mg Oral Daily Comer LocketSingleton, Amy E, MD   100 mg at 01/29/22 [id number]  ? traZODone (DESYREL) tablet 50 mg  50 mg Oral QHS PRN Ardis Hughsoleman, Carolyn H, NP   50 mg at 01/28/22 2107  ? Vitamin D (Ergocalciferol) (DRISDOL)  capsule 50,000 Units  50,000 Units Oral Q7 days Comer LocketSingleton, Amy E, MD   50,000 Units at 01/29/22 [id number]  ? ? ?Lab Results:  ?Results for orders placed or performed during the hospital encounter of 01/18/22 (from the past 48 hour(s))  ?Hepatitis panel, acute     Status: Abnormal  ? Collection Time: 01/28/22  7:00 AM  ?Result Value Ref Range  ? Hepatitis B Surface Ag NON REACTIVE NON REACTIVE  ? HCV Ab Reactive (A) NON REACTIVE  ?  Comment: (NOTE) ?The CDC recommends that a Reactive HCV antibody result be followed up  ?with a HCV Nucleic Acid Amplification test. ? ?  ? Hep A IgM NON REACTIVE NON REACTIVE  ? Hep B C IgM NON REACTIVE NON REACTIVE  ?  Comment: Performed at [facility], [address], [place], [place] [address]  ? ? ?Blood Alcohol level:  ?Lab Results  ?Component Value Date  ? ETH <10 01/15/2022  ? ETH <10 01/08/2022  ? ? ?Metabolic Disorder Labs: ?Lab Results  ?Component Value Date  ? HGBA1C 5.0 01/15/2022  ? MPG 97 01/15/2022  ? MPG 96.8 10/09/2021  ? ?Lab Results  ?Component Value Date  ? PROLACTIN 2.6 (L) 01/15/2022  ? ?Lab Results  ?Component Value Date  ? CHOL 171 01/15/2022  ? TRIG 10 01/15/2022  ? HDL 36 (L) 01/15/2022  ? CHOLHDL 4.8 01/15/2022  ? VLDL 2 01/15/2022  ? LDLCALC 133 (H) 01/15/2022  ? LDLCALC 131 (H) 10/09/2021  ? ? ?Physical Findings: ?AIMS: Facial and Oral Movements ?Muscles of Facial Expression: None, normal ?Lips and Perioral Area: None, normal ?Jaw: None, normal ?Tongue: None, normal,Extremity Movements ?Upper (arms, wrists, hands, fingers): None, normal ?Lower (legs, knees, ankles, toes): None, normal, Trunk Movements ?Neck, shoulders, hips: None, normal, Overall Severity ?Severity of abnormal movements (highest score from questions above): None, normal ?Incapacitation due to abnormal movements: None, normal ?Patient's awareness of abnormal movements (rate only patient's report): No Awareness, Dental Status ?Current problems with teeth and/or dentures?: No ?Does patient  usually wear dentures?: No  ?CIWA:  CIWA-Ar Total: 3 ?COWS:    ? ?Musculoskeletal: ?Strength & Muscle Tone: within normal limits ?Gait & Station: normal ?Patient leans: N/A ? ?Psychiatric Specialty Exam: ? ?Present

## 2022-01-29 NOTE — Group Note (Signed)
LCSW Group Therapy Note ? ? ?Group Date: 01/29/2022 ?Start Time: 1300 ?End Time: 1400 ? ? ?Type of Therapy and Topic: Group Therapy: Worry and Anxiety ? ?Participation Level: [facility] PARTICIPATION LEVEL: Active ? ?Description of Group: ?In this group, patients will be encouraged to explore their worry around what could happen vs what will happen. Each patient will be challenged to think of personal worries and how they will work their way through that worry around what will happen and what could happen. This group will be process-oriented, with patients participating in exploration of their own experiences as well as giving and receiving support and challenge from other group members. ? ?Therapeutic Goals: ?Patient will identify personal worries that cause anxiety. ?Patient will identify clues to identify their worry. ?Patient will identify ways to handle their worry. ?Patient will discuss ways that their worry has deceased or why it has not decreased.  ? ?Summary of Patient Progress: Patient was provided with packet for group and given the opportunity to speak with CSW one on one around topic.  ? ? ?Therapeutic Modalities: ? ?Cognitive Behavioral Therapy ?Solution Focused Therapy ?Motivational Interviewing ? ? ?Jerae Izard MSW, LCSW ?Clincal Social Worker  ?[facility]  ?

## 2022-01-29 NOTE — Progress Notes (Signed)
?   01/29/22 2000  ?Psych Admission Type (Psych Patients Only)  ?Admission Status Involuntary  ?Psychosocial Assessment  ?Patient Complaints Isolation;Depression  ?Eye Contact Fair  ?Facial Expression Flat  ?Affect Depressed  ?Speech Soft  ?Interaction Guarded  ?Motor Activity Slow  ?Behavior Characteristics Cooperative  ?Mood Depressed;Anxious  ?Aggressive Behavior  ?Effect No apparent injury  ?Thought Process  ?Coherency Circumstantial;Blocking  ?Content WDL  ?Delusions WDL  ?Perception WDL  ?Hallucination None reported or observed  ?Judgment Poor  ?Confusion None  ?Danger to Self  ?Current suicidal ideation? Denies  ?Danger to Others  ?Danger to Others None reported or observed  ? ? ?

## 2022-01-29 NOTE — Progress Notes (Signed)
Pt has spent the majority of the morning and afternoon shift in bed, but she is starting to get up and walk around her room.  Pt was found on the phone in the hallway, knowing she could not talk on the phone because pt  is not able to  leave her room due to pt's covid status.   ? ?Pt requesting nicotine gum for anxiety and cravings.  Pt responding well to gum.   ? ?Pt says she is excited that she is leaving tomorrow. ?

## 2022-01-29 NOTE — BH IP Treatment Plan (Signed)
Interdisciplinary Treatment and Diagnostic Plan Update ? ?01/29/2022 ?Time of Session: update ?Barbara Gordon ?MRN: [id number] ? ?Principal Diagnosis: 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HCC) ? ?Secondary Diagnoses: Principal Problem: ?  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HCC) ?Active Problems: ?  History of seizure ?  Cannabis use disorder, severe, dependence (HCC) ?  Borderline personality disorder (HCC) ?  Cigarette nicotine dependence without complication ? ? ?Current Medications:  ?Current Facility-Administered Medications  ?Medication Dose Route Frequency Provider Last Rate Last Admin  ? acetaminophen (TYLENOL) tablet 650 mg  650 mg Oral Q6H PRN Ardis Hughs, NP      ? alum & mag hydroxide-simeth (MAALOX/MYLANTA) 200-200-20 MG/5ML suspension 30 mL  30 mL Oral Q4H PRN Ardis Hughs, NP      ? benztropine (COGENTIN) tablet 0.5 mg  0.5 mg Oral BID PRN Comer Locket, MD   0.5 mg at 01/28/22 2107  ? feeding supplement (ENSURE ENLIVE / ENSURE PLUS) liquid 237 mL  237 mL Oral BID BM Massengill, Harrold Donath, MD   237 mL at 01/29/22 0914  ? FLUoxetine (PROZAC) capsule 10 mg  10 mg Oral Daily Mason Jim, Amy E, MD   10 mg at 01/29/22 7026  ? haloperidol (HALDOL) tablet 10 mg  10 mg Oral QHS Eliseo Gum B, MD   10 mg at 01/28/22 2107  ? lamoTRIgine (LAMICTAL) tablet 25 mg  25 mg Oral Daily Comer Locket, MD   25 mg at 01/29/22 3785  ? [START ON 02/03/2022] lamoTRIgine (LAMICTAL) tablet 50 mg  50 mg Oral Daily Mason Jim, Amy E, MD      ? liothyronine (CYTOMEL) tablet 25 mcg  25 mcg Oral Daily Bobbye Morton, MD   25 mcg at 01/29/22 0913  ? OLANZapine zydis (ZYPREXA) disintegrating tablet 10 mg  10 mg Oral Q8H PRN Ardis Hughs, NP   10 mg at 01/26/22 8850  ? And  ? LORazepam (ATIVAN) tablet 1 mg  1 mg Oral PRN Ardis Hughs, NP      ? And  ? ziprasidone (GEODON) injection 20 mg  20 mg Intramuscular PRN Ardis Hughs, NP      ? magnesium hydroxide (MILK OF MAGNESIA) suspension 30 mL  30 mL Oral Daily PRN Ardis Hughs, NP      ? multivitamin with minerals tablet 1 tablet  1 tablet Oral Daily Comer Locket, MD   1 tablet at 01/29/22 2774  ? nicotine polacrilex (NICORETTE) gum 2 mg  2 mg Oral PRN Phineas Inches, MD   2 mg at 01/28/22 1606  ? OLANZapine (ZYPREXA) tablet 5 mg  5 mg Oral QHS Eliseo Gum B, MD   5 mg at 01/28/22 2107  ? thiamine tablet 100 mg  100 mg Oral Daily Comer Locket, MD   100 mg at 01/29/22 1287  ? traZODone (DESYREL) tablet 50 mg  50 mg Oral QHS PRN Ardis Hughs, NP   50 mg at 01/28/22 2107  ? Vitamin D (Ergocalciferol) (DRISDOL) capsule 50,000 Units  50,000 Units Oral Q7 days Comer Locket, MD   50,000 Units at 01/29/22 8676  ? ?PTA Medications: ?Medications Prior to Admission  ?Medication Sig Dispense Refill Last Dose  ? OLANZapine (ZYPREXA) 5 MG tablet Take 1 tablet (5 mg total) by mouth daily.     ? OLANZapine zydis (ZYPREXA) 10 MG disintegrating tablet Take 1 tablet (10 mg total) by mouth at bedtime.     ? ? ?  Patient Stressors:   ? ?Patient Strengths:   ? ?Treatment Modalities: Medication Management, Group therapy, Case management,  ?1 to 1 session with clinician, Psychoeducation, Recreational therapy. ? ? ?Physician Treatment Plan for Primary Diagnosis: 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HCC) ?Long Term Goal(s): Improvement in symptoms so as ready for discharge  ? ?Short Term Goals: Ability to identify changes in lifestyle to reduce recurrence of condition will improve ?Ability to verbalize feelings will improve ?Ability to disclose and discuss suicidal ideas ?Ability to demonstrate self-control will improve ?Ability to identify and develop effective coping behaviors will improve ?Ability to maintain clinical measurements within normal limits will improve ?Compliance with prescribed medications will improve ?Ability to  identify triggers associated with substance abuse/mental health issues will improve ? ?Medication Management: Evaluate patient's response, side effects, and tolerance of medication regimen. ? ?Therapeutic Interventions: 1 to 1 sessions, Unit Group sessions and Medication administration. ? ?Evaluation of Outcomes: Progressing ? ?Physician Treatment Plan for Secondary Diagnosis: Principal Problem: ?  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HCC) ?Active Problems: ?  History of seizure ?  Cannabis use disorder, severe, dependence (HCC) ?  Borderline personality disorder (HCC) ?  Cigarette nicotine dependence without complication ? ?Long Term Goal(s): Improvement in symptoms so as ready for discharge  ? ?Short Term Goals: Ability to identify changes in lifestyle to reduce recurrence of condition will improve ?Ability to verbalize feelings will improve ?Ability to disclose and discuss suicidal ideas ?Ability to demonstrate self-control will improve ?Ability to identify and develop effective coping behaviors will improve ?Ability to maintain clinical measurements within normal limits will improve ?Compliance with prescribed medications will improve ?Ability to identify triggers associated with substance abuse/mental health issues will improve    ? ?Medication Management: Evaluate patient's response, side effects, and tolerance of medication regimen. ? ?Therapeutic Interventions: 1 to 1 sessions, Unit Group sessions and Medication administration. ? ?Evaluation of Outcomes: Progressing ? ? ?RN Treatment Plan for Primary Diagnosis: 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HCC) ?Long Term Goal(s): Knowledge of disease and therapeutic regimen to maintain health will improve ? ?Short Term Goals: Ability to remain free from injury will improve, Ability to verbalize frustration and anger appropriately will improve, Ability to demonstrate self-control,  Ability to participate in decision making will improve, Ability to verbalize feelings will improve, Ability to disclose and discuss suicidal ideas, Ability to identify and develop effective coping behaviors will improve, and Compliance with prescribed medications will improve ? ?Medication Management: RN will administer medications as ordered by provider, will assess and evaluate patient's response and provide education to patient for prescribed medication. RN will report any adverse and/or side effects to prescribing provider. ? ?Therapeutic Interventions: 1 on 1 counseling sessions, Psychoeducation, Medication administration, Evaluate responses to treatment, Monitor vital signs and CBGs as ordered, Perform/monitor CIWA, COWS, AIMS and Fall Risk screenings as ordered, Perform wound care treatments as ordered. ? ?Evaluation of Outcomes: Progressing ? ? ?LCSW Treatment Plan for Primary Diagnosis: 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HCC) ?Long Term Goal(s): Safe transition to appropriate next level of care at discharge, Engage patient in therapeutic group addressing interpersonal concerns. ? ?Short Term Goals: Engage patient in aftercare planning with referrals and resources, Increase social support, Increase ability to appropriately verbalize feelings, Increase emotional regulation, Facilitate acceptance of mental health diagnosis and concerns, Facilitate patient progression through stages of change regarding substance use diagnoses and concerns, Identify triggers  associated with mental health/substance abuse issues, and Increase skills for wellness and recovery ? ?Therapeutic Interventions: Assess for all discharge needs, 1 to 1 time with Child psychotherapist, Explore available resources and support systems, Assess for adequacy in community support network, Educate family and significant other(s) on suicide prevention, Complete Psychosocial Assessment, Interpersonal group  therapy. ? ?Evaluation of Outcomes: Progressing ? ? ?Progress in Treatment: ?Attending groups: No. ?Participating in groups: No. ?Taking medication as prescribed: Yes. ?Toleration medication: Yes. ?Family/Si

## 2022-01-29 NOTE — [facility] Counselor (Signed)
CSW spoke with [name] who reports that he can pick patient up at the hospital around noon tomorrow, Saturday.   ? ? ?Kei Langhorst, LCSW, LCAS ?Clincal Social Worker  ?[facility] ? ?

## 2022-01-29 NOTE — Progress Notes (Signed)
?   01/29/22 0500  ?Sleep  ?Number of Hours 7.5  ? ? ?

## 2022-01-30 LAB — HCV RT-PCR, QUANT (NON-GRAPH)
HCV log10: 5.444 log10 IU/mL
Hepatitis C Quantitation: 278000 IU/mL

## 2022-01-30 LAB — HCV AB W REFLEX TO QUANT PCR: HCV Ab: REACTIVE — AB

## 2022-01-30 MED ORDER — NICOTINE POLACRILEX 2 MG MT GUM
2.0000 mg | CHEWING_GUM | OROMUCOSAL | 0 refills | Status: DC | PRN
  Filled 2022-02-04: qty 50, 50d supply, fill #0

## 2022-01-30 MED ORDER — LAMOTRIGINE 25 MG PO TABS
25.0000 mg | ORAL_TABLET | Freq: Every day | ORAL | 0 refills | Status: DC
  Filled 2022-02-04: qty 4, 4d supply, fill #0

## 2022-01-30 MED ORDER — BENZTROPINE MESYLATE 0.5 MG PO TABS
0.5000 mg | ORAL_TABLET | Freq: Two times a day (BID) | ORAL | 0 refills | Status: DC | PRN
  Filled 2022-02-04: qty 30, 15d supply, fill #0

## 2022-01-30 MED ORDER — LAMOTRIGINE 25 MG PO TABS
50.0000 mg | ORAL_TABLET | Freq: Every day | ORAL | 0 refills | Status: DC
  Filled 2022-02-04: qty 28, 14d supply, fill #0

## 2022-01-30 MED ORDER — VITAMIN D (ERGOCALCIFEROL) 1.25 MG (50000 UNIT) PO CAPS: 50000.0000 [IU] | ORAL_CAPSULE | ORAL | Status: DC

## 2022-01-30 MED ORDER — FLUOXETINE HCL 10 MG PO CAPS
10.0000 mg | ORAL_CAPSULE | Freq: Every day | ORAL | 0 refills | Status: DC
Start: 2022-01-31 — End: 2022-04-22
  Filled 2022-02-04: qty 30, 30d supply, fill #0

## 2022-01-30 MED ORDER — THIAMINE HCL 100 MG PO TABS: 100.0000 mg | ORAL_TABLET | Freq: Every day | ORAL | Status: DC

## 2022-01-30 MED ORDER — HALOPERIDOL 10 MG PO TABS
10.0000 mg | ORAL_TABLET | Freq: Every day | ORAL | 0 refills | Status: DC
  Filled 2022-02-04: qty 30, 30d supply, fill #0

## 2022-01-30 MED ORDER — LIOTHYRONINE SODIUM 25 MCG PO TABS
25.0000 ug | ORAL_TABLET | Freq: Every day | ORAL | 0 refills | Status: DC
  Filled 2022-02-04: qty 30, 30d supply, fill #0

## 2022-01-30 NOTE — Discharge Summary (Signed)
Physician Discharge Summary Note ? ?Patient:  Barbara Gordon is an 31 y.o., female ?MRN:  [id number] ?DOB:  [birth year] ?Patient phone:  [phone] (home)  ?Patient address:   ?[address] ?[place] [address],  ?Total Time spent with patient:  ?I personally spent 30 minutes on the unit in direct patient care. The direct patient care time included face-to-face time with the patient, reviewing the patient's chart, communicating with other professionals, and coordinating care. Greater than 50% of this time was spent in counseling or coordinating care with the patient regarding goals of hospitalization, psycho-education, and discharge planning needs. ? ? ?Date of Admission:  01/18/2022 ?Date of Discharge: 01/30/2022 ? ?Reason for Admission:   ?Barbara Gordon is a 31 y.o Caucasian female who initially walked into this [facility] 01/08/22 accompanied by a female who stated that she ingested something. Prior to being assessed at that time, pt began having a seizure like activity, foaming at the mouth lasting 2 minutes. Pt was transferred to [facility] ED, requested to be discharged from there, and presented back to [facility] on 3/4, asked for mental health resources and left. For this hospitalization, "pt was IVC'd by a friend Barbara Gordon, [phone]). Per IVC paperwork:  ?"According to friend, respondent has taken some sort of narcotic substance that has affected her ability to take care of herself.  Friend states that what ever she has done/taken has "erased her mind", she is talking to herself, she is paranoid, she has not been eating or drinking regularly for days." ([facility], 01/15/22). Pt was determined to be  a danger to herself and transferred to this this [facility] for treatment and stabilization of her mood.  ? ?Principal Problem: 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HCC) ?Discharge Diagnoses: Principal Problem: ?  Schizophrenia  spectrum disorder with psychotic disorder type not yet determined (HCC) ?Active Problems: ?  History of seizure ?  Cannabis use disorder, severe, dependence (HCC) ?  Borderline personality disorder (HCC) ?  Cigarette nicotine dependence without complication ? ? ?Past Psychiatric History: bipolar d/o, anxiety, borderline personality d/o, polysubstance abuse ? ?Past Medical History: History reviewed. No pertinent past medical history. History reviewed. No pertinent surgical history. ?Family History: History reviewed. No pertinent family history. ?Family Psychiatric  History: Bipolar in mother as per chart review ?Social History:  ?Social History  ? ?Substance and Sexual Activity  ?Alcohol Use None  ?   ?Social History  ? ?Substance and Sexual Activity  ?Drug Use Never  ?  ?Social History  ? ?Socioeconomic History  ? Marital status: Single  ?  Spouse name: Not on file  ? Number of children: Not on file  ? Years of education: Not on file  ? Highest education level: Not on file  ?Occupational History  ? Not on file  ?Tobacco Use  ? Smoking status: Some Days  ?  Types: Cigarettes  ? Smokeless tobacco: Never  ?Substance and Sexual Activity  ? Alcohol use: Not on file  ? Drug use: Never  ? Sexual activity: Never  ?Other Topics Concern  ? Not on file  ?Social History Narrative  ? Not on file  ? ?Social Determinants of Health  ? ?Financial Resource Strain: Not on file  ?Food Insecurity: Not on file  ?Transportation Needs: Not on file  ?Physical Activity: Not on file  ?Stress: Not on file  ?Social Connections: Not on file  ? ? ?Hospital Course:   ?During the  patient's hospitalization, patient had extensive initial psychiatric evaluation, and follow-up psychiatric evaluations every day. ? ?Psychiatric diagnoses provided upon initial assessment: Schizophrenia spectrum disorder with psychotic disorder type not yet determined ? ?Patient's psychiatric medications were adjusted on admission: Continued on Zyprexa ? ?During the  hospitalization, other adjustments were made to the patient's psychiatric medication regimen: She was tapered off of Zyprexa due to over sedation and started on Haldol and Prozac. ? ?Patient's care was discussed during the interdisciplinary team meeting every day during the hospitalization. ? ?The patient \\is  not having side effects to prescribed psychiatric medication. ? ?Gradually, patient started adjusting to milieu. The patient was evaluated each day by a clinical provider to ascertain response to treatment. Improvement was noted by the patient's report of decreasing symptoms, improved sleep and appetite, affect, medication tolerance, behavior, and participation in unit programming.  Patient was asked each day to complete a self inventory noting mood, mental status, pain, new symptoms, anxiety and concerns.   ?Symptoms were reported as significantly decreased or resolved completely by discharge.  ?The patient reports that their mood is stable.  ?The patient denied having suicidal thoughts for more than 48 hours prior to discharge.  Patient denies having homicidal thoughts.  Patient denies having auditory hallucinations.  Patient denies any visual hallucinations or other symptoms of psychosis.  ?The patient was motivated to continue taking medication with a goal of continued improvement in mental health.  ? ?The patient reports their target psychiatric symptoms of self-care deficits, bizarre behaviors, and paranoia responded well to the psychiatric medications, and the patient reports overall benefit other psychiatric hospitalization. Supportive psychotherapy was provided to the patient. The patient also participated in regular group therapy while hospitalized. Coping skills, problem solving as well as relaxation therapies were also part of the unit programming. ? ?Labs were reviewed with the patient, and abnormal results were discussed with the patient. ? ?The patient is able to verbalize their individual safety  plan to this provider. ? ?# It is recommended to the patient to continue psychiatric medications as prescribed, after discharge from the hospital.   ? ?# It is recommended to the patient to follow up with your outpatient psychiatric provider and PCP. ? ?# It was discussed with the patient, the impact of alcohol, drugs, tobacco have been there overall psychiatric and medical wellbeing, and total abstinence from substance use was recommended the patient.ed. ? ?# Prescriptions provided or sent directly to preferred pharmacy at discharge. Patient agreeable to plan. Given opportunity to ask questions. Appears to feel comfortable with discharge.  ?  ?# In the event of worsening symptoms, the patient is instructed to call the crisis hotline, 911 and or go to the nearest ED for appropriate evaluation and treatment of symptoms. To follow-up with primary care provider for other medical issues, concerns and or health care needs ? ?# Patient was discharged to her friend Barbara Glassman with a plan to follow up as noted below. ? ? ? ?On day of discharge she reports that she is doing okay today.  She reports that she slept okay last night.  She reports her appetite is doing good.  She reports nausea, HI, or AVH.  She reports no Parnoia, Ideas of Reference, or other [facility] symptoms.  She reports no issues with her medications.  Discussed with her the need for follow-up appointment for her thyroid, hep C, and vitamin D deficiency.  She reported understanding and had no questions about that.  Discussed with her that her other follow-up  appointments will be in her discharge summary encouraged her to make them.  Discussed with her what to do in the event of a future crisis.  [facility], go to the nearest ED, or call 911 or [phone].   She reported understanding and had no concerns.  She was discharged home with her friend Barbara Glassmanyler. ? ? ? ? ?Physical Findings: ?AIMS: Facial and Oral Movements ?Muscles of  Facial Expression: None, normal ?Lips and Perioral Area: None, normal ?Jaw: None, normal ?Tongue: None, normal,Extremity Movements ?Upper (arms, wrists, hands, fingers): None, normal ?Lower (legs, knees, ankle

## 2022-01-30 NOTE — Progress Notes (Signed)
?   01/30/22 0500  ?Sleep  ?Number of Hours 10.5  ? ? ?

## 2022-01-30 NOTE — [facility] Suicide Risk Assessment (Signed)
Suicide Risk Assessment ? ?Discharge Assessment    ?[facility] ? ? ?Principal Problem: 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HCC) ?Discharge Diagnoses: Principal Problem: ?  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HCC) ?Active Problems: ?  History of seizure ?  Cannabis use disorder, severe, dependence (HCC) ?  Borderline personality disorder (HCC) ?  Cigarette nicotine dependence without complication ? ?During the patient's hospitalization, patient had extensive initial psychiatric evaluation, and follow-up psychiatric evaluations every day. ? ?Psychiatric diagnoses provided upon initial assessment: Schizophrenia spectrum disorder with psychotic disorder type not yet determined ? ?Patient's psychiatric medications were adjusted on admission: Continued on Zyprexa ? ?During the hospitalization, other adjustments were made to the patient's psychiatric medication regimen: She was tapered off of Zyprexa due to over sedation and started on Haldol and Prozac. ? ?Gradually, patient started adjusting to milieu.   ?Patient's care was discussed during the interdisciplinary team meeting every day during the hospitalization. ? ?The patient is not having side effects to prescribed psychiatric medication. ? ?The patient reports their target psychiatric symptoms of self-care deficits, bizarre behaviors, and paranoia responded well to the psychiatric medications, and the patient reports overall benefit other psychiatric hospitalization. Supportive psychotherapy was provided to the patient. The patient also participated in regular group therapy while admitted.  ? ?Labs were reviewed with the patient, and abnormal results were discussed with the patient. ? ?The patient denied having suicidal thoughts more than 48 hours prior to discharge.  Patient denies having homicidal thoughts.  Patient denies having auditory  hallucinations.  Patient denies any visual hallucinations.  Patient denies having paranoid thoughts. ? ?The patient is able to verbalize their individual safety plan to this provider. ? ?It is recommended to the patient to continue psychiatric medications as prescribed, after discharge from the hospital.   ? ?It is recommended to the patient to follow up with your outpatient psychiatric provider and PCP. ? ?Discussed with the patient, the impact of alcohol, drugs, tobacco have been there overall psychiatric and medical wellbeing, and total abstinence from substance use was recommended the patient. ? ?Total Time spent with patient:  ?I personally spent 30 minutes on the unit in direct patient care. The direct patient care time included face-to-face time with the patient, reviewing the patient's chart, communicating with other professionals, and coordinating care. Greater than 50% of this time was spent in counseling or coordinating care with the patient regarding goals of hospitalization, psycho-education, and discharge planning needs. ? ? ?Musculoskeletal: ?Strength & Muscle Tone: within normal limits ?Gait & Station:  laying in bed during interview ?Patient leans: N/A ? ?Psychiatric Specialty Exam ? ?Presentation  ?General Appearance: Appropriate for Environment; Casual ? ?Eye Contact:Good ? ?Speech:Clear and Coherent; Normal Rate ? ?Speech Volume:Normal ? ?Handedness:Right ? ? ?Mood and Affect  ?Mood:-- ("ok") ? ?Duration of Depression Symptoms: Greater than two weeks ? ?Affect:-- (somewhat flat) ? ? ?Thought Process  ?Thought Processes:Goal Directed ? ?Descriptions of Associations:Intact ? ?Orientation:Full (Time, Place and Person) ? ?Thought Content:Logical ?No SI, HI, or AVH. No Paranoia, Ideas of Reference, or First Rank symptoms. ? ?History of Schizophrenia/Schizoaffective disorder:Yes ? ?Duration of Psychotic Symptoms:Less than six months ? ?Hallucinations:Hallucinations: None ? ?Ideas of  Reference:None ? ?Suicidal Thoughts:Suicidal Thoughts: No ? ?Homicidal Thoughts:Homicidal Thoughts: No ? ? ?Sensorium  ?Memory:Immediate Fair; Recent Fair ? ?Judgment:Fair ? ?Insight:Shallow ? ? ?Executive Functions  ?Concentration:Fair ? ?Attention Span:Fair ? ?Recall:Fair ? ?Fund of Knowledge:Fair ? ?Language:Fair ? ? ?Psychomotor  Activity  ?Psychomotor Activity:Psychomotor Activity: Decreased ? ? ?Assets  ?Assets:Resilience ? ? ?Sleep  ?Sleep:Sleep: Good ?Number of Hours of Sleep: 10.5 ? ? ?Physical Exam: ?Physical Exam ?Vitals and nursing note reviewed.  ?Constitutional:   ?   General: She is not in acute distress. ?   Appearance: Normal appearance. She is normal weight. She is not ill-appearing or toxic-appearing.  ?HENT:  ?   Head: Normocephalic and atraumatic.  ?Pulmonary:  ?   Effort: Pulmonary effort is normal.  ?Musculoskeletal:     ?   General: Normal range of motion.  ?Neurological:  ?   General: No focal deficit present.  ?   Mental Status: She is alert.  ? ?Review of Systems  ?Respiratory:  Negative for cough and shortness of breath.   ?Cardiovascular:  Negative for chest pain.  ?Gastrointestinal:  Negative for abdominal pain, constipation, diarrhea, nausea and vomiting.  ?Neurological:  Negative for dizziness, weakness and headaches.  ?Psychiatric/Behavioral:  Negative for depression, hallucinations and suicidal ideas. The patient is not nervous/anxious.   ?Blood pressure 120/79, pulse (!) 105, temperature 98.3 ?F (36.8 ?C), temperature source Oral, resp. rate 16, height 5\' 1"  (1.549 m), weight 51.2 kg, SpO2 98 %. Body mass index is 21.31 kg/m?. ? ?Mental Status Per Nursing Assessment::   ?On Admission:  NA ? ?Demographic Factors:  ?Adolescent or young adult, Caucasian, Low socioeconomic status, and Unemployed ? ?Loss Factors: ?NA ? ?Historical Factors: ?NA ? ?Risk Reduction Factors:   ?NA ? ?Continued Clinical Symptoms:  ?Personality Disorders:   Cluster B ?Comorbid alcohol abuse/dependence ?More  than one psychiatric diagnosis ?Previous Psychiatric Diagnoses and Treatments ?Medical Diagnoses and Treatments/Surgeries ? ?Cognitive Features That Contribute To Risk:  ?None   ? ?Suicide Risk:  ?Mild:  No current SI but has had previous suicide attempt.  There are no identifiable plans, no associated intent, mild dysphoria and related symptoms, good self-control (both objective and subjective assessment), few other risk factors, and identifiable protective factors, including available and accessible social support. ? ? Follow-up Information   ? ? [facility] Follow up.   ?Specialty: Behavioral Health ?Why: You may go to this provider for therapy and medication management services during walk in hours:  Monday through Wednesday, from 7:30 am to 10:30 am.  Services are provided on a first come, first served basis, so please arrive early. ?Contact information: ?[address] ?[place] ?[phone] ? ?  ?  ? ? [facility] Follow up.   ?Why: Please call to set up a hospital follow up appointment.  A referral has been placed on your behalf. ?Contact information: ?[address] ?[address] ?[place] ?[phone] ? ? ?  ?  ? ? [facility]. Call.   ?Why: The mission of [facility] is to provide a safe haven for homeless women (ages 42 and older without dependents). [facility] offers 22 beds (14 during COVID-19 regulations), hot meals, clothing, case management, Life Skills classes and, most importantly, hope. Residents have a safe and nurturing place to stay while stabilizing their lives and making plans for the future ?Contact information: ?[address] ?[place], [place] ?[phone] ?(call for bed availability) ? ?  ?  ? ? [facility] Disability Assistance. Call.   ?Why: Please call to follow up on referral placed on your behalf.  They will help you apply and connect with disability services. ?Contact  information: ?Alicia Price-Blanks ?Director of Disability Services ?Phone: [phone]  ext [phone] ?Fax: [phone] ? ?  ?  ? ? Adult Management consultant (APS) Case Worker. Call.   ?Why: APS caseworker will follow up to assist and provide ongoin

## 2022-01-30 NOTE — Progress Notes (Signed)
?  [facility] Adult Case Management Discharge Plan : ? ?Will you be returning to the same living situation after discharge:  Yes,  with friend [name] ?At discharge, do you have transportation home?: Yes,  friend [name] ?Do you have the ability to pay for your medications: No.  Will get samples ? ?Release of information consent forms completed and emailed to Medical Records, then turned in to Medical Records by CSW.  ? ?Patient to Follow up at: ? Follow-up Information   ? ? [facility] Follow up.   ?Specialty: Behavioral Health ?Why: You may go to this provider for therapy and medication management services during walk in hours:  Monday through Wednesday, from 7:30 am to 10:30 am.  Services are provided on a first come, first served basis, so please arrive early. ?Contact information: ?[address] ?[place] [address] ?[phone] ? ?  ?  ? ? [facility]. Follow up.   ?Why: Please call to set up a hospital follow up appointment.  A referral has been placed on your behalf. ?Contact information: ?[address] ?[address] ?[place] [address] ?[phone] ? ? ?  ?  ? ? [facility]. Call.   ?Why: The mission of [facility] is to provide a safe haven for homeless women (ages 35 and older without dependents). [facility] offers 22 beds (14 during COVID-19 regulations), hot meals, clothing, case management, Life Skills classes and, most importantly, hope. Residents have a safe and nurturing place to stay while stabilizing their lives and making plans for the future ?Contact information: ?[address] ?[place], [place] [address] ?[phone] ?(call for bed availability) ? ?  ?  ? ? [facility]. Call.   ?Why: Please call to follow up on referral placed on your behalf.  They will help you apply and connect with disability services. ?Contact information: ?Alicia Price-Blanks ?Director of Disability Services ?Phone: [phone] ext  [phone] ?Fax: [phone] ? ?  ?  ? ? Adult Scientist, forensic (APS) Case Worker. Call.   ?Why: APS caseworker will follow up to assist and provide ongoing support to ensure basic needs are met and ensure that you are safe. ?Contact information: ?[place] ?(ph) [phone] ?(f) [phone] ? ?  ?  ? ? [facility] Follow up.   ?Why: You have a follow up PCP appointment on April 13 at 8:50. Please follow up with Hep C diagnosis.  Please arrive to appointment 15 min early. ?Contact information: ?[address] ?[place] [address] ?[phone] ? ?  ?  ? ?  ?  ? ?  ? ? ?Next level of care provider has access to [facility] ? ?Safety Planning and Suicide Prevention discussed: Yes,  with friend [name] ? ?  ? ?Has patient been referred to the Quitline?: Patient refused referral ? ?Patient has been referred for addiction treatment: N/A ? ?Maretta Los, LCSW ?01/30/2022, 9:11 AM ?

## 2022-01-30 NOTE — Progress Notes (Signed)
RN met with pt and reviewed pt's discharge instructions.  Pt verbalized understanding of discharge instructions and pt did not have any questions. RN reviewed and provided pt with a copy of SRA, AVS and Transition Record.  RN returned pt's belongings to pt. Paper medication prescriptions and medication samples were given to pt.  Pt denied SI/HI/AVH and voiced no concerns.  Pt was appreciative of the care pt received at [facility].  Patient discharged to the lobby without incident. 

## 2022-02-01 ENCOUNTER — Telehealth (HOSPITAL_COMMUNITY): Payer: Self-pay

## 2022-02-01 NOTE — BH Assessment (Signed)
Care Management - Follow Up [facility] Discharges  ? ?Patient has been placed in an inpatient psychiatric hospital [facility]) on 01-18-22.  ?

## 2022-02-04 ENCOUNTER — Other Ambulatory Visit: Payer: Self-pay

## 2022-02-17 IMAGING — DX DG TOE 5TH 2+V*R*
3 series · 3 of 3 positions shown · non-contrast
Comparison: None.

CLINICAL DATA: Fifth right toe trauma.

EXAM:
RIGHT FIFTH TOE

[toe ap]
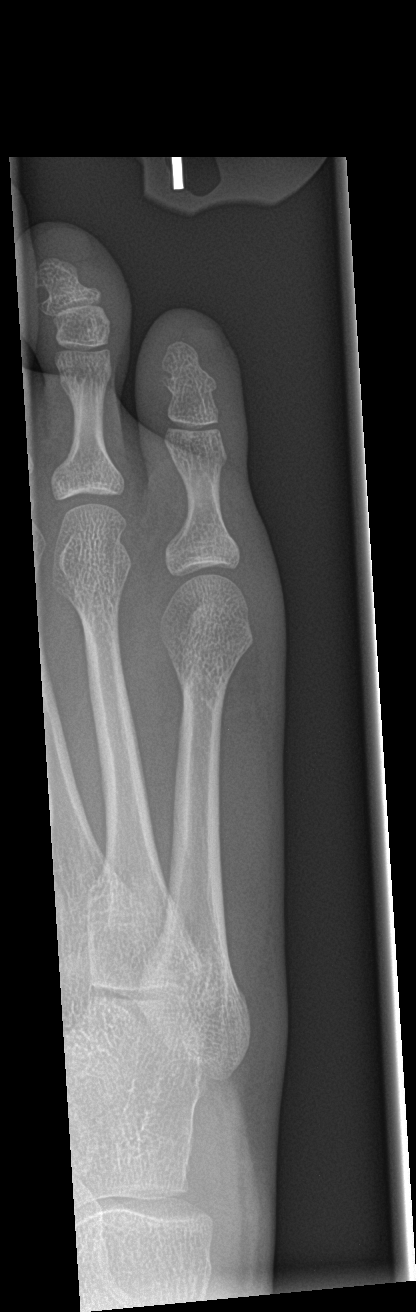

[toe obl]
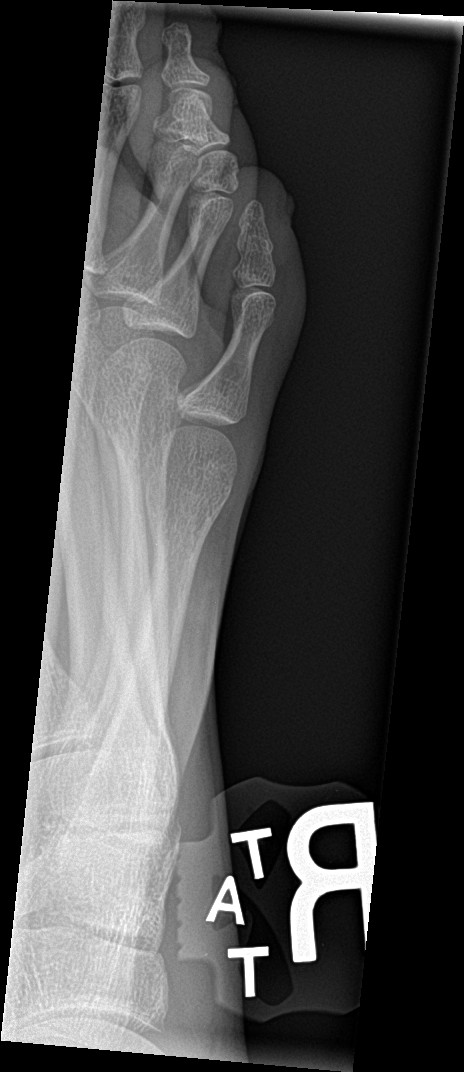

[toe lat]
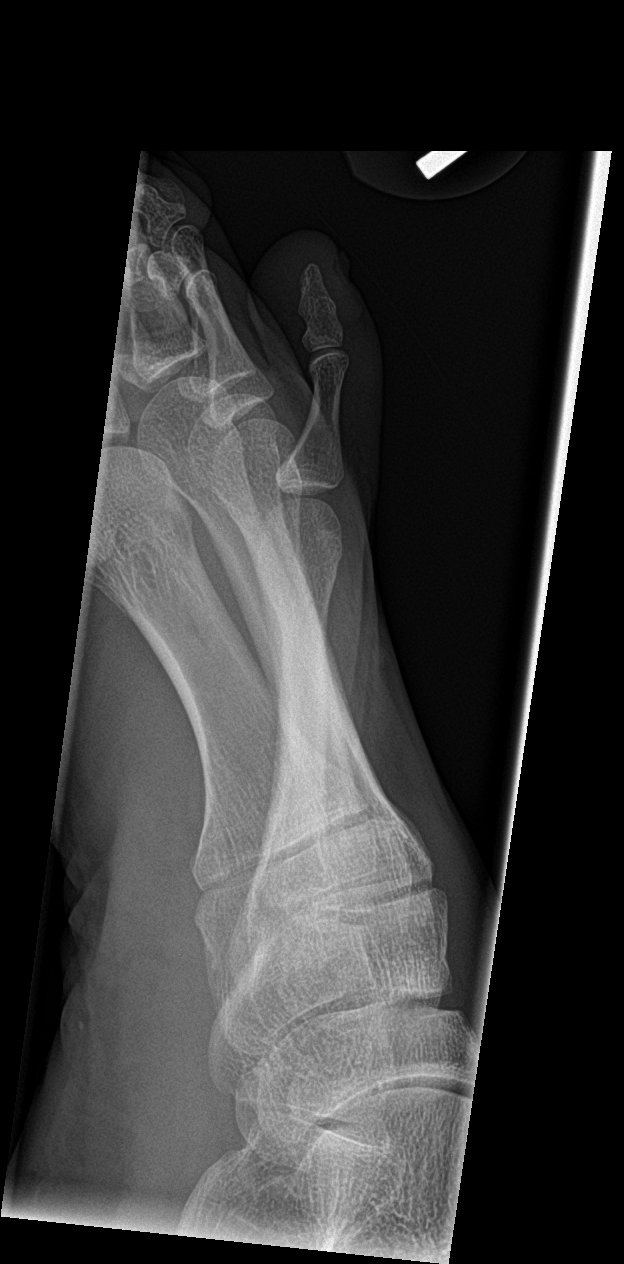

[3 of 3 positions shown; findings below may reference images not displayed]

FINDINGS: There is no evidence of fracture or dislocation. There is no
evidence of arthropathy or other focal bone abnormality. Soft
tissues are unremarkable.
IMPRESSION: Negative.

## 2022-02-17 NOTE — Progress Notes (Deleted)
Patient ID: Barbara Gordon, female   DOB: November 16, 1990, 31 y.o.   MRN: [id number] ?Reason for Admission:   ?Barbara Gordon is a 31 y.o Caucasian female who initially walked into this [facility] on 01/08/22 accompanied by a female who stated that she ingested something. Prior to being assessed at that time, pt began having a seizure like activity, foaming at the mouth lasting 2 minutes. Pt was transferred to [facility] ED, requested to be discharged from there, and presented back to [facility] on 3/4, asked for mental health resources and left. For this hospitalization, "pt was IVC'd by a friend Barbara Gordon, [phone]). Per IVC paperwork:  ?"According to friend, respondent has taken some sort of narcotic substance that has affected her ability to take care of herself.  Friend states that what ever she has done/taken has "erased her mind", she is talking to herself, she is paranoid, she has not been eating or drinking regularly for days." ([facility], 01/15/22). Pt was determined to be  a danger to herself and transferred to this this [facility] for treatment and stabilization of her mood.  ?  ?Principal Problem: 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HCC) ?Discharge Diagnoses: Principal Problem: ?  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HCC) ?Active Problems: ?  History of seizure ?  Cannabis use disorder, severe, dependence (HCC) ?  Borderline personality disorder (HCC) ?  Cigarette nicotine dependence without complication ?  ?  ?Past Psychiatric History: bipolar d/o, anxiety, borderline personality d/o, polysubstance abuse ?  ?Past Medical History: History reviewed. No pertinent past medical history. History reviewed. No pertinent surgical history. ?Family History: History reviewed. No pertinent family history. ?Family Psychiatric  History: Bipolar in mother as per chart review ? ?Hospital Course:   ?During the  patient's hospitalization, patient had extensive initial psychiatric evaluation, and follow-up psychiatric evaluations every day. ?  ?Psychiatric diagnoses provided upon initial assessment: Schizophrenia spectrum disorder with psychotic disorder type not yet determined ?  ?Patient's psychiatric medications were adjusted on admission: Continued on Zyprexa ?  ?During the hospitalization, other adjustments were made to the patient's psychiatric medication regimen: She was tapered off of Zyprexa due to over sedation and started on Haldol and Prozac. ?  ?Patient's care was discussed during the interdisciplinary team meeting every day during the hospitalization. ?  ?The patient \\is  not having side effects to prescribed psychiatric medication. ?  ?Gradually, patient started adjusting to milieu. The patient was evaluated each day by a clinical provider to ascertain response to treatment. Improvement was noted by the patient's report of decreasing symptoms, improved sleep and appetite, affect, medication tolerance, behavior, and participation in unit programming.  Patient was asked each day to complete a self inventory noting mood, mental status, pain, new symptoms, anxiety and concerns.   ?Symptoms were reported as significantly decreased or resolved completely by discharge.  ?The patient reports that their mood is stable.  ?The patient denied having suicidal thoughts for more than 48 hours prior to discharge.  Patient denies having homicidal thoughts.  Patient denies having auditory hallucinations.  Patient denies any visual hallucinations or other symptoms of psychosis.  ?The patient was motivated to continue taking medication with a goal of continued improvement in mental health.  ?  ?The patient reports their target psychiatric symptoms of self-care deficits, bizarre behaviors, and paranoia responded well to the psychiatric medications, and the patient reports overall benefit other psychiatric hospitalization.  Supportive psychotherapy was  provided to the patient. The patient also participated in regular group therapy while hospitalized. Coping skills, problem solving as well as relaxation therapies were also part of the unit programming. ?  ?Labs were reviewed with the patient, and abnormal results were discussed with the patient. ?  ?The patient is able to verbalize their individual safety plan to this provider. ?  ?# It is recommended to the patient to continue psychiatric medications as prescribed, after discharge from the hospital.   ?  ?# It is recommended to the patient to follow up with your outpatient psychiatric provider and PCP. ?  ?# It was discussed with the patient, the impact of alcohol, drugs, tobacco have been there overall psychiatric and medical wellbeing, and total abstinence from substance use was recommended the patient.ed. ?  ?# Prescriptions provided or sent directly to preferred pharmacy at discharge. Patient agreeable to plan. Given opportunity to ask questions. Appears to feel comfortable with discharge.  ?  ?# In the event of worsening symptoms, the patient is instructed to call the crisis hotline, 911 and or go to the nearest ED for appropriate evaluation and treatment of symptoms. To follow-up with primary care provider for other medical issues, concerns and or health care needs ?  ?# Patient was discharged to her friend Barbara Gordon with a plan to follow up as noted below. ?  ?  ?  ?On day of discharge she reports that she is doing okay today.  She reports that she slept okay last night.  She reports her appetite is doing good.  She reports nausea, HI, or AVH.  She reports no P[place]oia, Ideas of Reference, or other First Rank symptoms.  She reports no issues with her medications.  Discussed with her the need for follow-up appointment for her thyroid, hep C, and vitamin D deficiency.  She reported understanding and had no questions about that.  Discussed with her that her other follow-up appointments  will be in her discharge summary encouraged her to make them.  Discussed with her what to do in the event of a future crisis.  Discussed that she can return to [facility], go to the [facility], go to the nearest ED, or call 911 or [phone].   She reported understanding and had no concerns.  She was discharged home with her friend Barbara Gordon. ?  ? ?

## 2022-02-18 ENCOUNTER — Ambulatory Visit: Payer: Medicaid Other | Admitting: Physician Assistant

## 2022-02-24 ENCOUNTER — Ambulatory Visit: Payer: Self-pay | Admitting: Neurology

## 2022-02-26 ENCOUNTER — Emergency Department (HOSPITAL_COMMUNITY)
Admission: EM | Admit: 2022-02-26 | Discharge: 2022-02-26 | Disposition: A | Discharge status: Home / Self Care | Payer: Medicaid Other | Attending: Emergency Medicine | Admitting: Emergency Medicine

## 2022-02-26 ENCOUNTER — Encounter (HOSPITAL_COMMUNITY): Payer: Self-pay | Admitting: Emergency Medicine

## 2022-02-26 DIAGNOSIS — Z91040 Latex allergy status: Secondary | ICD-10-CM | POA: Insufficient documentation

## 2022-02-26 DIAGNOSIS — E860 Dehydration: Secondary | ICD-10-CM | POA: Insufficient documentation

## 2022-02-26 LAB — CBC WITH DIFFERENTIAL/PLATELET
Abs Immature Granulocytes: 0.03 10*3/uL (ref 0.00–0.07)
Basophils Absolute: 0 10*3/uL (ref 0.0–0.1)
Basophils Relative: 0 %
Eosinophils Absolute: 0.1 10*3/uL (ref 0.0–0.5)
Eosinophils Relative: 1 %
HCT: 37.2 % (ref 36.0–46.0)
Hemoglobin: 13.1 g/dL (ref 12.0–15.0)
Immature Granulocytes: 0 %
Lymphocytes Relative: 21 %
Lymphs Abs: 1.6 10*3/uL (ref 0.7–4.0)
MCH: 33.1 pg (ref 26.0–34.0)
MCHC: 35.2 g/dL (ref 30.0–36.0)
MCV: 93.9 fL (ref 80.0–100.0)
Monocytes Absolute: 0.5 10*3/uL (ref 0.1–1.0)
Monocytes Relative: 6 %
Neutro Abs: 5.3 10*3/uL (ref 1.7–7.7)
Neutrophils Relative %: 72 %
Platelets: 245 10*3/uL (ref 150–400)
RBC: 3.96 MIL/uL (ref 3.87–5.11)
RDW: 12.8 % (ref 11.5–15.5)
WBC: 7.5 10*3/uL (ref 4.0–10.5)
nRBC: 0 % (ref 0.0–0.2)

## 2022-02-26 LAB — URINALYSIS, ROUTINE W REFLEX MICROSCOPIC
Bilirubin Urine: NEGATIVE
Glucose, UA: NEGATIVE mg/dL
Hgb urine dipstick: NEGATIVE
Ketones, ur: NEGATIVE mg/dL
Leukocytes,Ua: NEGATIVE
Nitrite: NEGATIVE
Protein, ur: NEGATIVE mg/dL
Specific Gravity, Urine: 1.014 (ref 1.005–1.030)
pH: 6 (ref 5.0–8.0)

## 2022-02-26 LAB — RAPID URINE DRUG SCREEN, HOSP PERFORMED
Amphetamines: NOT DETECTED
Barbiturates: NOT DETECTED
Benzodiazepines: POSITIVE — AB
Cocaine: NOT DETECTED
Opiates: NOT DETECTED
Tetrahydrocannabinol: POSITIVE — AB

## 2022-02-26 LAB — COMPREHENSIVE METABOLIC PANEL
ALT: 57 U/L — ABNORMAL HIGH (ref 0–44)
AST: 40 U/L (ref 15–41)
Albumin: 4.7 g/dL (ref 3.5–5.0)
Alkaline Phosphatase: 44 U/L (ref 38–126)
Anion gap: 8 (ref 5–15)
BUN: 13 mg/dL (ref 6–20)
CO2: 24 mmol/L (ref 22–32)
Calcium: 9.3 mg/dL (ref 8.9–10.3)
Chloride: 105 mmol/L (ref 98–111)
Creatinine, Ser: 0.54 mg/dL (ref 0.44–1.00)
GFR, Estimated: >60 mL/min (ref >60)
Glucose, Bld: 96 mg/dL (ref 70–99)
Potassium: 3.5 mmol/L (ref 3.5–5.1)
Sodium: 137 mmol/L (ref 135–145)
Total Bilirubin: 0.8 mg/dL (ref 0.3–1.2)
Total Protein: 7.9 g/dL (ref 6.5–8.1)

## 2022-02-26 LAB — CBG MONITORING, ED: Glucose-Capillary: 102 mg/dL — ABNORMAL HIGH (ref 70–99)

## 2022-02-26 MED ORDER — SODIUM CHLORIDE 0.9 % IV BOLUS
1000.0000 mL | Freq: Once | INTRAVENOUS | Status: AC
  Administered 2022-02-26: 1000 mL via INTRAVENOUS

## 2022-02-26 NOTE — ED Notes (Signed)
Pt's friend, [name], called for transport home as pt is now discharged ?

## 2022-02-26 NOTE — ED Triage Notes (Signed)
Pt in from home via [facility] with AMS. Witness at home states pt had period of "facial contortion and body freezing" that began at 1pm today, continued throughout day. When EMS arrived, they gave 5mg  Versed for possible seizure activity. Pt recently started haldol and states compliance with all other meds. Denies any pain, just "doesn't feel like herself" ?

## 2022-02-26 NOTE — ED Provider Notes (Signed)
?[facility]-EMERGENCY DEPT ?Provider Note ? ? ?CSN: [id number] ?Arrival date & time: 02/26/22  1926 ? ?  ? ?History ? ?Chief Complaint  ?Patient presents with  ? Altered Mental Status  ? ? ?Barbara Gordon is a 31 y.o. female. ? ?Pt is a [age] yo female with a pmhx significant for schizophrenia spectrum disorder, seizure, borderline personality d/o, anxiety, and polysubstance abuse.  Pt was admitted to inpatient psych from 3/13-35 and was started on multiple psych meds.  Pt said she's been "off" today.  She is unable to describe what that feels like.  Pt has been freezing in place, but will talk and interact.  She was given 5 mg of versed by the first responders because they were concerned she was having a seizure.  However, pt was talking at the time.  Pt said she's been taking her meds that were prescribed at d/c.  She did not make [facility]. ? ? ?  ? ?Home Medications ?Prior to Admission medications   ?Medication Sig Start Date End Date Taking? Authorizing Provider  ?benztropine (COGENTIN) 0.5 MG tablet Take 1 tablet (0.5 mg total) by mouth 2 (two) times daily as needed for up to 30 doses for tremors (EPS). 01/30/22   Lauro FranklinPashayan, Alexander S, MD  ?FLUoxetine (PROZAC) 10 MG capsule Take 1 capsule (10 mg total) by mouth daily. 01/31/22 03/06/22  Lauro FranklinPashayan, Alexander S, MD  ?haloperidol (HALDOL) 10 MG tablet Take 1 tablet (10 mg total) by mouth at bedtime. 01/30/22 03/06/22  Lauro FranklinPashayan, Alexander S, MD  ?lamoTRIgine (LAMICTAL) 25 MG tablet Take 1 tablet (25 mg total) by mouth daily for 4 days. 01/31/22   Lauro FranklinPashayan, Alexander S, MD  ?lamoTRIgine (LAMICTAL) 25 MG tablet Take 2 tablets (50 mg total) by mouth daily for 14 days. 02/03/22 02/18/22  Lauro FranklinPashayan, Alexander S, MD  ?liothyronine (CYTOMEL) 25 MCG tablet Take 1 tablet (25 mcg total) by mouth daily. 01/31/22 03/06/22  Lauro FranklinPashayan, Alexander S, MD  ?nicotine polacrilex (NICORETTE) 2 MG gum Take 1 each (2 mg total) by mouth as needed for  smoking cessation. 01/30/22   Lauro FranklinPashayan, Alexander S, MD  ?thiamine 100 MG tablet Take 1 tablet (100 mg total) by mouth daily. 01/31/22   Lauro FranklinPashayan, Alexander S, MD  ?Vitamin D, Ergocalciferol, (DRISDOL) 1.25 MG (50000 UNIT) CAPS capsule Take 1 capsule (50,000 Units total) by mouth every 7 (seven) days. 02/05/22   Lauro FranklinPashayan, Alexander S, MD  ?   ? ?Allergies    ?Latex and Tramadol   ? ?Review of Systems   ?Review of Systems  ?Neurological:  Positive for weakness.  ?All other systems reviewed and are negative. ? ?Physical Exam ?Updated Vital Signs ?BP (!) 135/93   Pulse (!) 107   Temp 98.2 ?F (36.8 ?C) (Oral)   Resp (!) 21   Wt 54.4 kg   LMP 01/14/2022   SpO2 97%   BMI 22.67 kg/m?  ?Physical Exam ?Vitals and nursing note reviewed.  ?Constitutional:   ?   Appearance: Normal appearance.  ?HENT:  ?   Head: Normocephalic and atraumatic.  ?   Right Ear: External ear normal.  ?   Left Ear: External ear normal.  ?   Nose: Nose normal.  ?   Mouth/Throat:  ?   Mouth: Mucous membranes are moist.  ?   Pharynx: Oropharynx is clear.  ?Eyes:  ?   Extraocular Movements: Extraocular movements intact.  ?   Conjunctiva/sclera: Conjunctivae normal.  ?  Pupils: Pupils are equal, round, and reactive to light.  ?Cardiovascular:  ?   Rate and Rhythm: Normal rate and regular rhythm.  ?   Pulses: Normal pulses.  ?   Heart sounds: Normal heart sounds.  ?Pulmonary:  ?   Effort: Pulmonary effort is normal.  ?   Breath sounds: Normal breath sounds.  ?Abdominal:  ?   General: Abdomen is flat. Bowel sounds are normal.  ?   Palpations: Abdomen is soft.  ?Musculoskeletal:     ?   General: Normal range of motion.  ?   Cervical back: Normal range of motion and neck supple.  ?Skin: ?   General: Skin is warm and dry.  ?   Capillary Refill: Capillary refill takes less than 2 seconds.  ?Neurological:  ?   General: No focal deficit present.  ?   Mental Status: She is alert and oriented to person, place, and time.  ?   Comments: No evidence of muscular  rigidity or any facial twitching or dystonia  ?Psychiatric:     ?   Mood and Affect: Affect is blunt and flat.  ? ? ?ED Results / Procedures / Treatments   ?Labs ?(all labs ordered are listed, but only abnormal results are displayed) ?Labs Reviewed  ?COMPREHENSIVE METABOLIC PANEL - Abnormal; Notable for the following components:  ?    Result Value  ? ALT 57 (*)   ? All other components within normal limits  ?URINALYSIS, ROUTINE W REFLEX MICROSCOPIC - Abnormal; Notable for the following components:  ? APPearance HAZY (*)   ? All other components within normal limits  ?RAPID URINE DRUG SCREEN, HOSP PERFORMED - Abnormal; Notable for the following components:  ? Benzodiazepines POSITIVE (*)   ? Tetrahydrocannabinol POSITIVE (*)   ? All other components within normal limits  ?CBG MONITORING, ED - Abnormal; Notable for the following components:  ? Glucose-Capillary 102 (*)   ? All other components within normal limits  ?CBC WITH DIFFERENTIAL/PLATELET  ? ? ?EKG ?EKG Interpretation ? ?Date/Time:  Friday February 26 2022 19:40:03 EDT ?Ventricular Rate:  95 ?PR Interval:  149 ?QRS Duration: 89 ?QT Interval:  355 ?QTC Calculation: 447 ?R Axis:   81 ?Text Interpretation: Sinus rhythm Baseline wander in lead(s) V6 duplicate Confirmed by Jacalyn Lefevre [id number]) on 02/26/2022 7:52:05 PM ? ?Radiology ?No results found. ? ?Procedures ?Procedures  ? ? ?Medications Ordered in ED ?Medications  ?sodium chloride 0.9 % bolus 1,000 mL (0 mLs Intravenous Stopped 02/26/22 2100)  ? ? ?ED Course/ Medical Decision Making/ A&P ?  ?                        ?Medical Decision Making ?Amount and/or Complexity of Data Reviewed ?Labs: ordered. ? ? ?This patient presents to the ED for concern of ams, this involves an extensive number of treatment options, and is a complaint that carries with it a high risk of complications and morbidity.  The differential diagnosis includes electrolyte abn, psych d/o, infection ? ? ?Co morbidities that complicate the patient  evaluation ? ?schizophrenia spectrum disorder, seizure, borderline personality d/o, anxiety, and polysubstance abuse ? ? ?Additional history obtained: ? ?Additional history obtained from epic chart review ?External records from outside source obtained and reviewed including EMS report ? ? ?Lab Tests: ? ?I Ordered, and personally interpreted labs.  The pertinent results include:  cbc is nl, cmp nl, ua nl; uds + mj and bzd (pt did receive versed by EMS) ? ?  Cardiac Monitoring: ? ?The patient was maintained on a cardiac monitor.  I personally viewed and interpreted the cardiac monitored which showed an underlying rhythm of: nsr ? ? ?Medicines ordered and prescription drug management: ? ?I ordered medication including ivfs  for rehydration  ?Reevaluation of the patient after these medicines showed that the patient improved ?I have reviewed the patients home medicines and have made adjustments as needed ? ? ?Test Considered: ? ?CT head, but pt is alert and moving all 4 extremities ? ? ?Critical Interventions: ? ?ivfs ? ? ? ?Problem List / ED Course: ? ?AMS:  pt is awake and alert.  She is oriented times 3.  She feels better after fluids.  There is no evidence of any neurologic issue.  Pt is stable for d/c.  Return if worse.  F/u with [facility]. ? ? ?Reevaluation: ? ?After the interventions noted above, I reevaluated the patient and found that they have :improved ? ? ?Social Determinants of Health: ? ?Lives at home ? ? ?Dispostion: ? ?After consideration of the diagnostic results and the patients response to treatment, I feel that the patent would benefit from discharge with outpatient f/u.   ? ? ? ? ? ? ? ?Final Clinical Impression(s) / ED Diagnoses ?Final diagnoses:  ?Dehydration  ? ? ?Rx / DC Orders ?ED Discharge Orders   ? ? None  ? ?  ? ? ?  ?Jacalyn Lefevre, MD ?02/26/22 2205 ? ?

## 2022-02-26 NOTE — ED Notes (Signed)
Patient verbalizes understanding of discharge instructions. Opportunity for questioning and answers were provided. Armband removed by staff, pt discharged from ED. Ambulated out to lobby  

## 2022-02-27 ENCOUNTER — Emergency Department (HOSPITAL_COMMUNITY)
Admission: EM | Admit: 2022-02-27 | Discharge: 2022-02-27 | Disposition: A | Discharge status: Home / Self Care | Payer: Medicaid Other | Attending: Emergency Medicine | Admitting: Emergency Medicine

## 2022-02-27 ENCOUNTER — Other Ambulatory Visit: Payer: Self-pay

## 2022-02-27 ENCOUNTER — Encounter (HOSPITAL_COMMUNITY): Payer: Self-pay

## 2022-02-27 ENCOUNTER — Encounter (HOSPITAL_COMMUNITY): Payer: Self-pay | Admitting: Emergency Medicine

## 2022-02-27 DIAGNOSIS — T43215A Adverse effect of selective serotonin and norepinephrine reuptake inhibitors, initial encounter: Secondary | ICD-10-CM | POA: Insufficient documentation

## 2022-02-27 DIAGNOSIS — R61 Generalized hyperhidrosis: Secondary | ICD-10-CM | POA: Insufficient documentation

## 2022-02-27 DIAGNOSIS — T434X5A Adverse effect of butyrophenone and thiothixene neuroleptics, initial encounter: Secondary | ICD-10-CM | POA: Insufficient documentation

## 2022-02-27 DIAGNOSIS — R251 Tremor, unspecified: Secondary | ICD-10-CM | POA: Insufficient documentation

## 2022-02-27 DIAGNOSIS — Z91040 Latex allergy status: Secondary | ICD-10-CM | POA: Insufficient documentation

## 2022-02-27 DIAGNOSIS — T887XXA Unspecified adverse effect of drug or medicament, initial encounter: Secondary | ICD-10-CM

## 2022-02-27 DIAGNOSIS — R000 Tachycardia, unspecified: Secondary | ICD-10-CM | POA: Insufficient documentation

## 2022-02-27 DIAGNOSIS — T452X5A Adverse effect of vitamins, initial encounter: Secondary | ICD-10-CM | POA: Insufficient documentation

## 2022-02-27 DIAGNOSIS — Z5321 Procedure and treatment not carried out due to patient leaving prior to being seen by health care provider: Secondary | ICD-10-CM | POA: Insufficient documentation

## 2022-02-27 DIAGNOSIS — T443X5A Adverse effect of other parasympatholytics [anticholinergics and antimuscarinics] and spasmolytics, initial encounter: Secondary | ICD-10-CM | POA: Insufficient documentation

## 2022-02-27 DIAGNOSIS — R4182 Altered mental status, unspecified: Secondary | ICD-10-CM | POA: Insufficient documentation

## 2022-02-27 DIAGNOSIS — G4700 Insomnia, unspecified: Secondary | ICD-10-CM | POA: Insufficient documentation

## 2022-02-27 NOTE — ED Notes (Signed)
Pt's friend took pt and left the facility because he did not want to wait.  ?

## 2022-02-27 NOTE — Discharge Instructions (Addendum)
You were seen here in the ER for evaluation of your tremors. This is likely a side effect of your medications. Please follow up with [facility] and the [place] [facility] for further management of your Hep C and psych medications. As discussed, remember to take Benadryl whenever you take your Haldol medication. If you have any new or worsening symptoms. Please return to the nearest ER for re-evaluation.  ? ?Contact a health care provider if: ?You are tired throughout the day. ?You have trouble in your daily routine due to sleepiness. ?You continue to have sleep problems, or your sleep problems get worse. ?Get help right away if: ?You have thoughts about hurting yourself or someone else. ?Get help right away if you feel like you may hurt yourself or others, or have thoughts about taking your own life. Go to your nearest emergency room or: ?Call 911. ?Call the National Suicide Prevention Lifeline at 531 337 5814 or 988. This is open 24 hours a day. ?Text the Crisis Text Line at (352)406-9909. ?

## 2022-02-27 NOTE — ED Provider Notes (Signed)
?[facility] EMERGENCY [facility]PARTMENT ?Provider Note ? ? ?CSN: [id number] ?Arrival date & time: 02/27/22  0709 ? ?  ? ?History ?Chief Complaint  ?Patient presents with  ? Medication Reaction  ? ? ?Barbara Gordon is a 31 y.o. female with history of polysubstance abuse, schizophrenia presents to the emergency department for possible medication reaction.  She presented via [facility] with her friend at bedside.  She was seen a few hours ago at [facility] for "muscle spasms" and altered mental status.  EMS at that time reported possible seizure although the patient was talking afterwards.  Discharge with diagnosis of dehydration. ? ?Today, the majority of the history is given by the friend and supplemented by the patient.  He reports that she keeps having these lower leg shakes as well as having trouble sleeping.  He wanted a second opinion from her previous visit.  Per the patient, she is feeling much better after the Benadryl and fluids and she would like to go home. ? ?EMS reports dystonic reaction and gave Benadryl 50 mg and resolved.  Patient states she is feeling better.  Triage note mentions the patient recently started on Haldol may have eaten a bag of THC Gummies and last day, although patient denies and denies any drug use. ? ?HPI ? ?  ? ?Home Medications ?Prior to Admission medications   ?Medication Sig Start Date End Date Taking? Authorizing Provider  ?benztropine (COGENTIN) 0.5 MG tablet Take 1 tablet (0.5 mg total) by mouth 2 (two) times daily as needed for up to 30 doses for tremors (EPS). 01/30/22   Lauro FranklinPashayan, Alexander S, MD  ?FLUoxetine (PROZAC) 10 MG capsule Take 1 capsule (10 mg total) by mouth daily. 01/31/22 03/06/22  Lauro FranklinPashayan, Alexander S, MD  ?haloperidol (HALDOL) 10 MG tablet Take 1 tablet (10 mg total) by mouth at bedtime. 01/30/22 03/06/22  Lauro FranklinPashayan, Alexander S, MD  ?lamoTRIgine (LAMICTAL) 25 MG tablet Take 1 tablet (25 mg total) by mouth daily for 4 days. 01/31/22   Lauro FranklinPashayan, Alexander S,  MD  ?lamoTRIgine (LAMICTAL) 25 MG tablet Take 2 tablets (50 mg total) by mouth daily for 14 days. 02/03/22 02/18/22  Lauro FranklinPashayan, Alexander S, MD  ?liothyronine (CYTOMEL) 25 MCG tablet Take 1 tablet (25 mcg total) by mouth daily. 01/31/22 03/06/22  Lauro FranklinPashayan, Alexander S, MD  ?nicotine polacrilex (NICORETTE) 2 MG gum Take 1 each (2 mg total) by mouth as needed for smoking cessation. 01/30/22   Lauro FranklinPashayan, Alexander S, MD  ?thiamine 100 MG tablet Take 1 tablet (100 mg total) by mouth daily. 01/31/22   Lauro FranklinPashayan, Alexander S, MD  ?Vitamin D, Ergocalciferol, (DRISDOL) 1.25 MG (50000 UNIT) CAPS capsule Take 1 capsule (50,000 Units total) by mouth every 7 (seven) days. 02/05/22   Lauro FranklinPashayan, Alexander S, MD  ?   ? ?Allergies    ?Latex and Tramadol   ? ?Review of Systems   ?Review of Systems  ?Constitutional:  Negative for chills and fever.  ?Eyes:  Negative for visual disturbance.  ?Respiratory:  Negative for shortness of breath.   ?Cardiovascular:  Negative for chest pain.  ?Genitourinary:  Negative for dysuria and hematuria.  ?Neurological:  Positive for tremors. Negative for seizures and light-headedness.  ?Psychiatric/Behavioral:  Positive for sleep disturbance.   ? ?Physical Exam ?Updated Vital Signs ?BP 133/88 (BP Location: Right Arm)   Pulse (!) 101   Temp 98.6 ?F (37 ?C) (Oral)   Resp 18   LMP 02/14/2022   SpO2 100%  ?Physical Exam ?Constitutional:   ?  Appearance: She is not toxic-appearing.  ?HENT:  ?   Mouth/Throat:  ?   Mouth: Mucous membranes are moist.  ?   Comments: Poor dentition ?Eyes:  ?   General: No scleral icterus. ?   Extraocular Movements: Extraocular movements intact.  ?   Pupils: Pupils are equal, round, and reactive to light.  ?Cardiovascular:  ?   Rate and Rhythm: Tachycardia present.  ?Pulmonary:  ?   Effort: Pulmonary effort is normal. No respiratory distress.  ?Abdominal:  ?   Palpations: Abdomen is soft.  ?   Tenderness: There is no abdominal tenderness. There is no guarding or rebound.   ?Musculoskeletal:  ?   Cervical back: Normal range of motion.  ?Skin: ?   General: Skin is dry.  ?   Findings: No rash.  ?Neurological:  ?   General: No focal deficit present.  ?   Mental Status: She is alert and oriented to person, place, and time. Mental status is at baseline.  ?   Cranial Nerves: No cranial nerve deficit.  ?   Sensory: No sensory deficit.  ?   Motor: No weakness.  ?   Gait: Gait normal.  ?   Comments: Patient alert and oriented x3.  Cranial nerves II through XII intact.  Normal gait.  The patient does have occasional lower leg tremors bilaterally.  Normal finger-nose.  Sensation intact throughout.  No weakness.  No facial twitching noticed.  No muscle rigidity or signs of dystonia.  ?Psychiatric:     ?   Mood and Affect: Mood normal. Affect is blunt and flat.     ?   Thought Content: Thought content does not include homicidal or suicidal ideation. Thought content does not include homicidal or suicidal plan.  ? ? ?ED Results / Procedures / Treatments   ?Labs ?(all labs ordered are listed, but only abnormal results are displayed) ?Labs Reviewed - No data to display ? ?EKG ?None ? ?Radiology ?No results found. ? ?Procedures ?Procedures  ? ?Medications Ordered in ED ?Medications - No data to display ? ?ED Course/ Medical Decision Making/ A&P ?  ?                        ?Medical Decision Making ? ?31 year old female presents emergency department for evaluation of tremors and insomnia.  Differential diagnosis includes is not limited to tardive dyskinesia, dystonia, seizure, insomnia, medication reaction.  Vital signs are normotensive, afebrile, borderline tachycardia, but with chart review, this is difficult for the patient.  She is satting well on room air without any increased work of breathing.  Physical exam shows well-appearing female with blunted and flat affect.  She has normal gait and benign neurological exam.  She denies any suicidal or homicidal ideation or plan.  She denies any  hallucinations.Patient alert and oriented x3.  Cranial nerves II through XII intact.  Normal gait.  The patient does have occasional lower leg tremors bilaterally.  Normal finger-nose.  Sensation intact throughout.  No weakness.  No facial twitching noticed.  No muscle rigidity or signs of dystonia.  ? ?The patient reports she is feeling better and would like to go home. ? ?The patient's visit to [facility] almost 12 hours prior involved CMP, urinalysis, and rapid drug screen as well as an EKG.  The patient had an elevated ALT, hazy appearing urine, she did test positive for benzodiazepines and THC.  Normal CBG.  EKG showed sinus rhythm. ? ?I do not  see any need to repeat the patient's labs as she was just seen a few hours prior. ? ?The patient is currently on Cogentin, Prozac, Haldol, Lamictal, and thiamine.  She most likely is having polypharmacy reaction from these medications.  My attending suggested that she follow-up with the prescriber of the Haldol for medication adjustments. ? ?Discussed the case with my attending who recommended taking Benadryl whenever she takes her Haldol next time.  Does not see any need for head CT at this time. ? ?Discussed these recommendations with the patient and friend at bedside.  Strict return precautions were discussed.  They verbalized understanding and agreed to plan.  Patient is stable being discharged home in good condition. ? ?I discussed this case with my attending physician who cosigned this note including patient's presenting symptoms, physical exam, and planned diagnostics and interventions. Attending physician stated agreement with plan or made changes to plan which were implemented.  ? ?Final Clinical Impression(s) / ED Diagnoses ?Final diagnoses:  ?Medication side effect  ?Tremor  ? ? ?Rx / DC Orders ?ED Discharge Orders   ? ? None  ? ?  ? ? ?  ?Achille Rich, PA-C ?03/02/22 1055 ? ?  ?Sloan Leiter, DO ?03/07/22 1902 ? ?

## 2022-02-27 NOTE — ED Triage Notes (Addendum)
Pt to triage via [facility].  This is pt's 3rd visit in 24 hours.  Seen at [facility] x 2 for "muscle spasms" and AMS.  Pt treated for dehydration.   ? ?EMS reports dystonic reaction and gave Benadryl 50mg  and symptoms resolved.  Pt states she feels better. ? ?Pt recently started on Haldol and may have eaten a bag of THC gummies in the last day. ? ? ?EMS- ?IV- 18g R wrist ?NS 1000mg  ?Benadryl 50 mg ?

## 2022-02-27 NOTE — ED Triage Notes (Addendum)
Pt reports with excessive sweating and not "acting like herself". Pt is holding her head to the side. Friend states that pt has liver failure and is dying without her treatments.  ?

## 2022-03-01 ENCOUNTER — Ambulatory Visit (HOSPITAL_COMMUNITY)
Admission: RE | Admit: 2022-03-01 | Discharge: 2022-03-01 | Disposition: A | Discharge status: Home / Self Care | Payer: No Payment, Other | Attending: Psychiatry | Admitting: Psychiatry

## 2022-03-01 ENCOUNTER — Encounter (HOSPITAL_COMMUNITY): Payer: Self-pay

## 2022-03-01 ENCOUNTER — Other Ambulatory Visit: Payer: Self-pay

## 2022-03-01 ENCOUNTER — Emergency Department (HOSPITAL_COMMUNITY)
Admission: EM | Admit: 2022-03-01 | Discharge: 2022-03-03 | Disposition: A | Discharge status: Other Acute Inpatient Hospital | Payer: Medicaid Other | Attending: Emergency Medicine | Admitting: Emergency Medicine

## 2022-03-01 DIAGNOSIS — Z79899 Other long term (current) drug therapy: Secondary | ICD-10-CM | POA: Insufficient documentation

## 2022-03-01 DIAGNOSIS — Z20822 Contact with and (suspected) exposure to covid-19: Secondary | ICD-10-CM | POA: Insufficient documentation

## 2022-03-01 DIAGNOSIS — F1999 Other psychoactive substance use, unspecified with unspecified psychoactive substance-induced disorder: Secondary | ICD-10-CM | POA: Diagnosis present

## 2022-03-01 DIAGNOSIS — I10 Essential (primary) hypertension: Secondary | ICD-10-CM | POA: Insufficient documentation

## 2022-03-01 DIAGNOSIS — F19988 Other psychoactive substance use, unspecified with other psychoactive substance-induced disorder: Secondary | ICD-10-CM | POA: Insufficient documentation

## 2022-03-01 DIAGNOSIS — F29 Unspecified psychosis not due to a substance or known physiological condition: Secondary | ICD-10-CM

## 2022-03-01 DIAGNOSIS — F12959 Cannabis use, unspecified with psychotic disorder, unspecified: Secondary | ICD-10-CM | POA: Diagnosis present

## 2022-03-01 DIAGNOSIS — Y900 Blood alcohol level of less than 20 mg/100 ml: Secondary | ICD-10-CM | POA: Insufficient documentation

## 2022-03-01 LAB — RAPID URINE DRUG SCREEN, HOSP PERFORMED
Amphetamines: NOT DETECTED
Barbiturates: NOT DETECTED
Benzodiazepines: NOT DETECTED
Cocaine: NOT DETECTED
Opiates: NOT DETECTED
Tetrahydrocannabinol: POSITIVE — AB

## 2022-03-01 LAB — CBC
HCT: 39.2 % (ref 36.0–46.0)
Hemoglobin: 13.9 g/dL (ref 12.0–15.0)
MCH: 33.3 pg (ref 26.0–34.0)
MCHC: 35.5 g/dL (ref 30.0–36.0)
MCV: 93.8 fL (ref 80.0–100.0)
Platelets: 299 10*3/uL (ref 150–400)
RBC: 4.18 MIL/uL (ref 3.87–5.11)
RDW: 12.6 % (ref 11.5–15.5)
WBC: 6.8 10*3/uL (ref 4.0–10.5)
nRBC: 0 % (ref 0.0–0.2)

## 2022-03-01 LAB — URINALYSIS, ROUTINE W REFLEX MICROSCOPIC
Bilirubin Urine: NEGATIVE
Glucose, UA: NEGATIVE mg/dL
Hgb urine dipstick: NEGATIVE
Ketones, ur: NEGATIVE mg/dL
Leukocytes,Ua: NEGATIVE
Nitrite: NEGATIVE
Protein, ur: NEGATIVE mg/dL
Specific Gravity, Urine: 1.013 (ref 1.005–1.030)
pH: 6 (ref 5.0–8.0)

## 2022-03-01 LAB — BASIC METABOLIC PANEL
Anion gap: 8 (ref 5–15)
BUN: 12 mg/dL (ref 6–20)
CO2: 24 mmol/L (ref 22–32)
Calcium: 9.9 mg/dL (ref 8.9–10.3)
Chloride: 107 mmol/L (ref 98–111)
Creatinine, Ser: 0.56 mg/dL (ref 0.44–1.00)
GFR, Estimated: >60 mL/min (ref >60)
Glucose, Bld: 105 mg/dL — ABNORMAL HIGH (ref 70–99)
Potassium: 3.9 mmol/L (ref 3.5–5.1)
Sodium: 139 mmol/L (ref 135–145)

## 2022-03-01 LAB — PREGNANCY, URINE: Preg Test, Ur: NEGATIVE

## 2022-03-01 LAB — ETHANOL: Alcohol, Ethyl (B): <10 mg/dL (ref <10)

## 2022-03-01 MED ORDER — BENZTROPINE MESYLATE 0.5 MG PO TABS
0.5000 mg | ORAL_TABLET | Freq: Two times a day (BID) | ORAL | Status: DC | PRN
  Administered 2022-03-01 – 2022-03-02 (×3): 0.5 mg via ORAL
  Filled 2022-03-01 (×3): qty 1

## 2022-03-01 MED ORDER — LIOTHYRONINE SODIUM 25 MCG PO TABS
25.0000 ug | ORAL_TABLET | Freq: Every day | ORAL | Status: DC
  Administered 2022-03-02 – 2022-03-03 (×2): 25 ug via ORAL
  Filled 2022-03-01 (×3): qty 1

## 2022-03-01 MED ORDER — HALOPERIDOL 5 MG PO TABS
10.0000 mg | ORAL_TABLET | Freq: Every day | ORAL | Status: DC
  Administered 2022-03-01 – 2022-03-03 (×2): 10 mg via ORAL
  Filled 2022-03-01 (×2): qty 2

## 2022-03-01 MED ORDER — FLUOXETINE HCL 10 MG PO CAPS
10.0000 mg | ORAL_CAPSULE | Freq: Every day | ORAL | Status: DC
  Administered 2022-03-02 – 2022-03-03 (×2): 10 mg via ORAL
  Filled 2022-03-01 (×2): qty 1

## 2022-03-01 MED ORDER — THIAMINE HCL 100 MG PO TABS
100.0000 mg | ORAL_TABLET | Freq: Every day | ORAL | Status: DC
  Administered 2022-03-02 – 2022-03-03 (×2): 100 mg via ORAL
  Filled 2022-03-01 (×3): qty 1

## 2022-03-01 NOTE — ED Provider Notes (Signed)
?[facility] DEPT ?Provider Note ? ? ?CSN: [id number] ?Arrival date & time: 03/01/22  1708 ? ?  ? ?History ? ?Chief Complaint  ?Patient presents with  ? Medical Clearance  ? ? ?Barbara Gordon is a 31 y.o. female. ? ?HPI ?Patient reportedly sent from behavioral health for medical clearance.  Reportedly had to said patient was not feeling well.  Patient really not provide any history. ?  ?Reported history of schizophrenia.  On Lamictal Haldol.  Also appears to have history of substance use disorder and seizures.  Patient will only answer yes or no to some questions.  Really cannot provide much history.  Denies suicidal or homicidal thoughts. ? ?Home Medications ?Prior to Admission medications   ?Medication Sig Start Date End Date Taking? Authorizing Provider  ?benztropine (COGENTIN) 0.5 MG tablet Take 1 tablet (0.5 mg total) by mouth 2 (two) times daily as needed for up to 30 doses for tremors (EPS). 01/30/22   Briant Cedar, MD  ?FLUoxetine (PROZAC) 10 MG capsule Take 1 capsule (10 mg total) by mouth daily. 01/31/22 03/06/22  Briant Cedar, MD  ?haloperidol (HALDOL) 10 MG tablet Take 1 tablet (10 mg total) by mouth at bedtime. 01/30/22 03/06/22  Briant Cedar, MD  ?lamoTRIgine (LAMICTAL) 25 MG tablet Take 1 tablet (25 mg total) by mouth daily for 4 days. 01/31/22   Briant Cedar, MD  ?lamoTRIgine (LAMICTAL) 25 MG tablet Take 2 tablets (50 mg total) by mouth daily for 14 days. 02/03/22 02/18/22  Briant Cedar, MD  ?liothyronine (CYTOMEL) 25 MCG tablet Take 1 tablet (25 mcg total) by mouth daily. 01/31/22 03/06/22  Briant Cedar, MD  ?nicotine polacrilex (NICORETTE) 2 MG gum Take 1 each (2 mg total) by mouth as needed for smoking cessation. 01/30/22   Briant Cedar, MD  ?thiamine 100 MG tablet Take 1 tablet (100 mg total) by mouth daily. 01/31/22   Briant Cedar, MD  ?Vitamin D, Ergocalciferol, (DRISDOL) 1.25 MG (50000 UNIT) CAPS capsule  Take 1 capsule (50,000 Units total) by mouth every 7 (seven) days. 02/05/22   Briant Cedar, MD  ?   ? ?Allergies    ?Latex and Tramadol   ? ?Review of Systems   ?Review of Systems ? ?Physical Exam ?Updated Vital Signs ?BP (!) 148/100 (BP Location: Left Arm)   Pulse (!) 108   Temp 98.6 ?F (37 ?C) (Oral)   Resp 18   Ht 5\' 1"  (1.549 m)   Wt 54.4 kg   LMP 02/14/2022   SpO2 100%   BMI 22.67 kg/m?  ?Physical Exam ?Vitals and nursing note reviewed.  ?HENT:  ?   Head: Normocephalic.  ?Cardiovascular:  ?   Rate and Rhythm: Regular rhythm.  ?Chest:  ?   Chest wall: No tenderness.  ?Abdominal:  ?   Tenderness: There is no abdominal tenderness.  ?Musculoskeletal:     ?   General: No tenderness.  ?   Cervical back: Neck supple.  ?Skin: ?   General: Skin is warm.  ?Neurological:  ?   Mental Status: She is alert.  ?   Comments: We will follow some commands and will answer some questions yes or no.  ?Psychiatric:  ?   Comments: Patient maintained eye contact the whole examination.  ? ? ?ED Results / Procedures / Treatments   ?Labs ?(all labs ordered are listed, but only abnormal results are displayed) ?Labs Reviewed  ?RAPID URINE DRUG SCREEN, HOSP PERFORMED - Abnormal; Notable  for the following components:  ?    Result Value  ? Tetrahydrocannabinol POSITIVE (*)   ? All other components within normal limits  ?BASIC METABOLIC PANEL - Abnormal; Notable for the following components:  ? Glucose, Bld 105 (*)   ? All other components within normal limits  ?RESP PANEL BY RT-PCR (FLU A&B, COVID) ARPGX2  ?PREGNANCY, URINE  ?URINALYSIS, ROUTINE W REFLEX MICROSCOPIC  ?ETHANOL  ?CBC  ? ? ?EKG ?None ? ?Radiology ?No results found. ? ?Procedures ?Procedures  ? ? ?Medications Ordered in ED ?Medications  ?benztropine (COGENTIN) tablet 0.5 mg (has no administration in time range)  ?FLUoxetine (PROZAC) capsule 10 mg (has no administration in time range)  ?haloperidol (HALDOL) tablet 10 mg (has no administration in time range)   ?liothyronine (CYTOMEL) tablet 25 mcg (has no administration in time range)  ?thiamine tablet 100 mg (has no administration in time range)  ? ? ?ED Course/ Medical Decision Making/ A&P ?  ?                        ?Medical Decision Making ?Amount and/or Complexity of Data Reviewed ?Labs: ordered. ? ?Risk ?OTC drugs. ?Prescription drug management. ? ? ?Patient sent in for behavioral health for medical clearance.  Reportedly not feeling well.  Reportedly was somewhat hypertensive.  It is somewhat hypertensive now.  Is somewhat agitated however.  I think more likely the effect as opposed to the cause.  Lab work reassuring.  Patient appears medically cleared.  We will have patient seen by TTS.  Started on home medicine.  If blood pressure does not improve with relaxation potentially could start blood pressure medicine but will not start it yet. ? ? ? ? ? ? ? ?Final Clinical Impression(s) / ED Diagnoses ?Final diagnoses:  ?Psychosis, unspecified psychosis type ([place])  ?Hypertension, unspecified type  ? ? ?Rx / DC Orders ?ED Discharge Orders   ? ? None  ? ?  ? ? ?  ?Davonna Belling, MD ?03/01/22 2234 ? ?

## 2022-03-01 NOTE — ED Triage Notes (Signed)
Pt BIB EMS for medical clearance. Pt brought in from [facility] for "not feeling well" Per EMS pt [facility] staff stated pt was hypertensive and flushed. ? ?160/102 ?104 hr ?16 rr ?98% RA ?97.5 temp ?

## 2022-03-02 DIAGNOSIS — F29 Unspecified psychosis not due to a substance or known physiological condition: Secondary | ICD-10-CM

## 2022-03-02 LAB — RESP PANEL BY RT-PCR (FLU A&B, COVID) ARPGX2
Influenza A by PCR: NEGATIVE
Influenza B by PCR: NEGATIVE
SARS Coronavirus 2 by RT PCR: NEGATIVE

## 2022-03-02 MED ORDER — ACETAMINOPHEN 325 MG PO TABS
650.0000 mg | ORAL_TABLET | Freq: Once | ORAL | Status: AC
  Administered 2022-03-02: 650 mg via ORAL
  Filled 2022-03-02: qty 2

## 2022-03-02 MED ORDER — LORAZEPAM 1 MG PO TABS
1.0000 mg | ORAL_TABLET | Freq: Once | ORAL | Status: AC
Start: 2022-03-02 — End: 2022-03-02
  Administered 2022-03-02: 1 mg via ORAL
  Filled 2022-03-02: qty 1

## 2022-03-02 MED ORDER — HYDROXYZINE HCL 25 MG PO TABS
25.0000 mg | ORAL_TABLET | Freq: Three times a day (TID) | ORAL | Status: DC | PRN
  Administered 2022-03-02: 25 mg via ORAL
  Filled 2022-03-02: qty 1
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
## 2022-03-02 NOTE — Consult Note (Signed)
[facility] Face-to-Face Psychiatry Consult  ? ?Reason for Consult:  Psychiatric evaluation ?Referring Physician:  ER Physician ?Patient Identification: Barbara Gordon ?MRN:  [id number] ?Principal Diagnosis: 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[place]) ?Diagnosis:  Principal Problem: ?  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[place]) ?Active Problems: ?  Substance-induced disorder ([place]) ? ? ?Total Time spent with patient: 30 minutes ? ?Subjective:   ?Barbara Gordon is a 31 y.o. female patient admitted with c/o of not feeling well, not sleeping and feeling weird brought in by EMS.. ? ?HPI:  Female 31 years old was just seen in the ER tearful, anxious and shaking.  She was brought in last night by EMS for not feeling well.  Patient was recently discharged from inpatient behavioral health after 12 days stay.  She is diagnosed with  Schizophrenia Spectrum Disorder type undetermined.  Patient tearfully reported that she is "scared to death because people are out to kill me and all the members of my family"  Patient  also admitted taking "little Weed".  She reported that she lives in a house where everybody smokes Marijuana.  She yelled out crying when provider asked her for Father's number for collateral information.  She became more tearful stating that Provider is putting her father in danger.  We discussed the relationship between Marijuana and paranoia.  She also endorsed severe anxiety and depression.  She has resumed her home medications and she meets criteria for inpatient hospitalization and we will seek bed placement. ? ?Past Psychiatric History: bipolar d/o, anxiety, borderline personality d/o, polysubstance abuse.  Hospitalized at [facility] March 14-March 25/2023. ? ?Risk to Self:   ?Risk to Others:   ?Prior Inpatient Therapy:   ?Prior Outpatient Therapy:   ? ?Past Medical History: History reviewed. No pertinent past medical history. History  reviewed. No pertinent surgical history. ?Family History: History reviewed. No pertinent family history. ?Family Psychiatric  History: Mother -Bipolar disorder. ?Social History:  ?Social History  ? ?Substance and Sexual Activity  ?Alcohol Use Not Currently  ?   ?Social History  ? ?Substance and Sexual Activity  ?Drug Use Never  ?  ?Social History  ? ?Socioeconomic History  ? Marital status: Single  ?  Spouse name: Not on file  ? Number of children: Not on file  ? Years of education: Not on file  ? Highest education level: Not on file  ?Occupational History  ? Not on file  ?Tobacco Use  ? Smoking status: Some Days  ?  Types: Cigarettes  ? Smokeless tobacco: Never  ?Substance and Sexual Activity  ? Alcohol use: Not Currently  ? Drug use: Never  ? Sexual activity: Never  ?Other Topics Concern  ? Not on file  ?Social History Narrative  ? Not on file  ? ?Social Determinants of Health  ? ?Financial Resource Strain: Not on file  ?Food Insecurity: Not on file  ?Transportation Needs: Not on file  ?Physical Activity: Not on file  ?Stress: Not on file  ?Social Connections: Not on file  ? ?Additional Social History: ?  ? ?Allergies:   ?Allergies  ?Allergen Reactions  ? Latex Itching and Rash  ? Tramadol Itching and Other (See Comments)  ?  Provider: Marijean Bravo CFM - Allergy Description: TraMADol HCl *ANALGESICS - OPIOID* CFM - Allergy Annotation: Pruritus. ?  ? ? ?Labs:  ?Results for orders placed or performed during the hospital encounter of 03/01/22 (from the past 48 hour(s))  ?Basic metabolic panel  Status: Abnormal  ? Collection Time: 03/01/22  5:54 PM  ?Result Value Ref Range  ? Sodium 139 135 - 145 mmol/L  ? Potassium 3.9 3.5 - 5.1 mmol/L  ? Chloride 107 98 - 111 mmol/L  ? CO2 24 22 - 32 mmol/L  ? Glucose, Bld 105 (H) 70 - 99 mg/dL  ?  Comment: Glucose reference range applies only to samples taken after fasting for at least 8 hours.  ? BUN 12 6 - 20 mg/dL  ? Creatinine, Ser 0.56 0.44 - 1.00 mg/dL  ? Calcium 9.9  8.9 - 10.3 mg/dL  ? GFR, Estimated >60 >60 mL/min  ?  Comment: (NOTE) ?Calculated using the CKD-EPI Creatinine Equation (2021) ?  ? Anion gap 8 5 - 15  ?  Comment: Performed at [facility], [place] [address] [place], [place] [address]  ?Ethanol     Status: None  ? Collection Time: 03/01/22  5:54 PM  ?Result Value Ref Range  ? Alcohol, Ethyl (B) <10 <10 mg/dL  ?  Comment: (NOTE) ?Lowest detectable limit for serum alcohol is 10 mg/dL. ? ?For medical purposes only. ?Performed at [facility], [address] ?[place], [place] [address] ?  ?CBC     Status: None  ? Collection Time: 03/01/22  5:54 PM  ?Result Value Ref Range  ? WBC 6.8 4.0 - 10.5 K/uL  ? RBC 4.18 3.87 - 5.11 MIL/uL  ? Hemoglobin 13.9 12.0 - 15.0 g/dL  ? HCT 39.2 36.0 - 46.0 %  ? MCV 93.8 80.0 - 100.0 fL  ? MCH 33.3 26.0 - 34.0 pg  ? MCHC 35.5 30.0 - 36.0 g/dL  ? RDW 12.6 11.5 - 15.5 %  ? Platelets 299 150 - 400 K/uL  ? nRBC 0.0 0.0 - 0.2 %  ?  Comment: Performed at [facility], [place] [address], [place], [place] [address]  ?Urine rapid drug screen (hosp performed)     Status: Abnormal  ? Collection Time: 03/01/22  7:52 PM  ?Result Value Ref Range  ? Opiates NONE DETECTED NONE DETECTED  ? Cocaine NONE DETECTED NONE DETECTED  ? Benzodiazepines NONE DETECTED NONE DETECTED  ? Amphetamines NONE DETECTED NONE DETECTED  ? Tetrahydrocannabinol POSITIVE (A) NONE DETECTED  ? Barbiturates NONE DETECTED NONE DETECTED  ?  Comment: (NOTE) ?DRUG SCREEN FOR MEDICAL PURPOSES ?ONLY.  IF CONFIRMATION IS NEEDED ?FOR ANY PURPOSE, NOTIFY LAB ?WITHIN 5 DAYS. ? ?LOWEST DETECTABLE LIMITS ?FOR URINE DRUG SCREEN ?Drug Class                     Cutoff (ng/mL) ?Amphetamine and metabolites    1000 ?Barbiturate and metabolites    200 ?Benzodiazepine                 200 ?Tricyclics and metabolites     300 ?Opiates and metabolites        300 ?Cocaine and metabolites        300 ?THC                            50 ?Performed at [facility], [place] ?[place], [place] [address] ?  ?Pregnancy, urine     Status: None  ? Collection Time: 03/01/22  7:52 PM  ?Result Value Ref Range  ? Preg Test, Ur NEGATIVE NEGATIVE  ?  Comment:        ?THE SENSITIVITY OF  THIS ?METHODOLOGY IS >20 mIU/mL. ?Performed at [facility], [place] [address], [place], [place] [address] ?  ?Urinalysis, Routine w reflex microscopic Urine, Clean Catch     Status: None  ? Collection Time: 03/01/22  7:52 PM  ?Result Value Ref Range  ? Color, Urine YELLOW YELLOW  ? APPearance CLEAR CLEAR  ? Specific Gravity, Urine 1.013 1.005 - 1.030  ? pH 6.0 5.0 - 8.0  ? Glucose, UA NEGATIVE NEGATIVE mg/dL  ? Hgb urine dipstick NEGATIVE NEGATIVE  ? Bilirubin Urine NEGATIVE NEGATIVE  ? Ketones, ur NEGATIVE NEGATIVE mg/dL  ? Protein, ur NEGATIVE NEGATIVE mg/dL  ? Nitrite NEGATIVE NEGATIVE  ? Leukocytes,Ua NEGATIVE NEGATIVE  ?  Comment: Performed at [facility], [place] [address], [place], [place] [address]  ?Resp Panel by RT-PCR (Flu A&B, Covid) Nasopharyngeal Swab     Status: None  ? Collection Time: 03/01/22 11:28 PM  ? Specimen: Nasopharyngeal Swab; Nasopharyngeal(NP) swabs in vial transport medium  ?Result Value Ref Range  ? SARS Coronavirus 2 by RT PCR NEGATIVE NEGATIVE  ?  Comment: (NOTE) ?SARS-CoV-2 target nucleic acids are NOT DETECTED. ? ?The SARS-CoV-2 RNA is generally detectable in upper respiratory ?specimens during the acute phase of infection. The lowest ?concentration of SARS-CoV-2 viral copies this assay can detect is ?138 copies/mL. A negative result does not preclude SARS-Cov-2 ?infection and should not be used as the sole basis for treatment or ?other patient management decisions. A negative result may occur with  ?improper specimen collection/handling, submission of specimen other ?than nasopharyngeal swab, presence of viral mutation(s) within the ?areas targeted by this assay, and inadequate number of viral ?copies(<138  copies/mL). A negative result must be combined with ?clinical observations, patient history, and epidemiological ?information. The expected result is Negative. ? ?Fact Sheet for Patients:  ?https://www.fda.g

## 2022-03-02 NOTE — ED Notes (Signed)
Pt to bathroom to change into purple scrubs, pt clothing placed in bag, labeled and placed in locker.  Medications inventoried, made list and taken to pharmacy.  Pt wanded by security.  ?

## 2022-03-02 NOTE — ED Notes (Signed)
Pt is eating breakfast, calm and no distress noted ?

## 2022-03-02 NOTE — ED Notes (Signed)
Pt noted to be having tremor which looked like shivering during report, [place] reports that pt has tremors.  Staff member helped pt cover up and pt fell asleep and tremor subsided.  Will continue to monitor.  ?

## 2022-03-02 NOTE — Progress Notes (Signed)
CSW requested that [facility] Fransico Michael, RN review for [facility]. ? ?Maryjean Ka, MSW, LCSWA ?03/02/2022 11:48 PM ? ? ?

## 2022-03-02 NOTE — ED Notes (Signed)
Pt is currently sleeping, will get vitals for pt when pt awakes. ?

## 2022-03-02 NOTE — ED Notes (Signed)
Pharmacy tech with pt ?

## 2022-03-03 LAB — AMMONIA: Ammonia: 25 umol/L (ref 9–35)

## 2022-03-03 MED ORDER — LORAZEPAM 2 MG/ML IJ SOLN: 0.0000 mg | Freq: Two times a day (BID) | INTRAMUSCULAR | Status: DC

## 2022-03-03 MED ORDER — LORAZEPAM 1 MG PO TABS
0.0000 mg | ORAL_TABLET | Freq: Four times a day (QID) | ORAL | Status: DC
  Administered 2022-03-03: 2 mg via ORAL
  Administered 2022-03-03: 1 mg via ORAL
  Filled 2022-03-03: qty 1
  Filled 2022-03-03: qty 2
  Filled 2022-03-03: qty 1

## 2022-03-03 MED ORDER — LORAZEPAM 2 MG/ML IJ SOLN
0.0000 mg | Freq: Four times a day (QID) | INTRAMUSCULAR | Status: DC
  Administered 2022-03-03: 0 mg via INTRAVENOUS

## 2022-03-03 MED ORDER — LORAZEPAM 1 MG PO TABS: 0.0000 mg | ORAL_TABLET | Freq: Two times a day (BID) | ORAL | Status: DC

## 2022-03-03 NOTE — Progress Notes (Signed)
Patient has been denied by [facility] due to no appropriate beds available. Patient meets BH inpatient criteria per Dahlia Byes, NP. Patient has been faxed out to the following facilities:  ? ?[facility]  [address] [place] [address] [phone]  ?[facility]  [address] [place], [place] [address] [phone]  ?[facility] [place] [address] [phone]  ?[facility]  [address] [place], [place] [address] [phone]  ?[facility]  [address] [place] [address] [phone]  ?[facility]  [address], [place] [address] [phone]  ?[facility]  [address], [place] [address] [phone]  ?[facility]  [address], [place] [address] [phone]  ?[facility]  [address] [place] [address] [phone]  ?[facility]  [address] [place] [address] [phone]  ?[facility]  [address], [place] [address] [phone]  ?[facility]  [address] [place], [place] [address] [phone]  ?[facility]  [address] [place], [place] [address] [phone]  ?[facility]  [address] [place] [address] [phone]  ? ?Damita Dunnings, MSW, LCSW-A  ?10:02 AM 03/03/2022   ?

## 2022-03-03 NOTE — Progress Notes (Signed)
[facility] LCSW Progress Note ?  ?03/03/2022    11:27 AM ? ?Barbara Gordon  ? ?[id number]  ? ?Type of Contact and Topic:  Psychiatric Bed Placement  ? ?Pt accepted to [place] [facility]   ? ?Patient meets inpatient criteria per [name], NP  ? ?The attending provider will be [name], NP ? ?Call report to [phone] or [phone] ? ?[name], RN @ [facility] ED notified.    ? ?Pt scheduled  to arrive at [place] ANYTIME.  ? ? ?[name], MSW, LCSW-A  ?11:29 AM 03/03/2022   ?  ? ?  ?  ? ? ? ? ?  ?

## 2022-03-03 NOTE — Progress Notes (Signed)
Inpatient Behavioral Health Placement ? ?Pt meets inpatient criteria per Cleatrice Burke, NP. There are no available beds at [facility] per [facility] Wynonia Hazard, RN.  Referral was sent to the following facilities;  ? ?Destination ?Service Provider Address Phone Fax  ?[facility]  [address] [place] [address] [phone]  ?[facility]  [place], [place] [address] [phone]  ?[facility] [address] [place] [address] [phone]  ?[place]  [place], [place] [address] [phone]  ?[place] [address] [place] [address] [phone]  ?[facility]  [address] [place] [address] [phone]  ?[place]  [address] [place] [address] [phone]  ?[facility]  [address], [place] [address] [phone]  ?[facility]  [address], [place] [address] [phone]  ?[place]  [address] [place] [address] [phone]  ?[facility]  [address], [place] [address] [phone]  ?[facility]  [place], [place] [address] [phone]  ?[facility]  [place], [place] [address] [phone]  ?[facility]  [address] [place] [address] [phone]  ? ? ?Situation ongoing,  CSW will follow up. ? ? ?Benjaman Kindler, MSW, LCSWA ?03/03/2022  @ 12:12 AM ? ?

## 2022-03-03 NOTE — ED Notes (Signed)
Pt noted to be flushed, sweating, and tremors. PA Sami informed. Pt placed on CIWA at this time. Ativan 2mg  PO given  ?

## 2022-03-03 NOTE — ED Notes (Signed)
Pt calm and alert eating breakfast tray at bedside.  ?

## 2022-03-03 NOTE — ED Provider Notes (Signed)
Emergency Medicine Observation Re-evaluation Note ? ?Barbara Gordon is a 31 y.o. female, seen on rounds today.  Pt initially presented to the ED for complaints of Medical Clearance and Sleeping Problem ?Currently, the patient is resting in bed. ? ?Physical Exam  ?BP 122/73   Pulse (!) 117   Temp 99.3 ?F (37.4 ?C) (Oral)   Resp 17   Ht 5\' 1"  (1.549 m)   Wt 54.4 kg   LMP 02/14/2022   SpO2 96%   BMI 22.67 kg/m?  ?Physical Exam ?General: No acute distress ?Lungs: Normal effort ?Psych: Not suicidal ? ?ED Course / MDM  ?EKG:EKG Interpretation ? ?Date/Time:  Monday March 01 2022 19:38:51 EDT ?Ventricular Rate:  83 ?PR Interval:  144 ?QRS Duration: 84 ?QT Interval:  370 ?QTC Calculation: 434 ?R Axis:   91 ?Text Interpretation: Normal sinus rhythm Rightward axis Borderline ECG When compared with ECG of 26-Feb-2022 19:40, No significant change since last tracing Confirmed by 28-Feb-2022 [phone]) on 03/02/2022 9:44:31 AM ? ?I have reviewed the labs performed to date as well as medications administered while in observation.  Recent changes in the last 24 hours include home meds and prn meds. ? ?Plan  ?Current plan is for transfer to [facility]. [facility].  Psychiatry MD had asked for an ammonia level but this is normal.  I believe her mental status changes are psychiatric related. ? Barbara Gordon is not under involuntary commitment. ? ? ?  ?Joylene Igo, MD ?03/03/22 1145 ? ?

## 2022-03-11 ENCOUNTER — Inpatient Hospital Stay: Payer: Medicaid Other | Admitting: Physician Assistant

## 2022-04-06 ENCOUNTER — Other Ambulatory Visit: Payer: Self-pay

## 2022-04-06 ENCOUNTER — Ambulatory Visit (HOSPITAL_COMMUNITY)
Admission: AD | Admit: 2022-04-06 | Discharge: 2022-04-06 | Disposition: A | Discharge status: Home / Self Care | Payer: No Payment, Other | Source: Intra-hospital | Attending: Psychiatry | Admitting: Psychiatry

## 2022-04-06 ENCOUNTER — Emergency Department (HOSPITAL_COMMUNITY)
Admission: EM | Admit: 2022-04-06 | Discharge: 2022-04-06 | Discharge status: Against Medical Advice | Payer: Medicaid Other | Attending: Emergency Medicine | Admitting: Emergency Medicine

## 2022-04-06 ENCOUNTER — Encounter (HOSPITAL_COMMUNITY): Payer: Self-pay | Admitting: Emergency Medicine

## 2022-04-06 DIAGNOSIS — Z008 Encounter for other general examination: Secondary | ICD-10-CM

## 2022-04-06 DIAGNOSIS — Z20822 Contact with and (suspected) exposure to covid-19: Secondary | ICD-10-CM | POA: Insufficient documentation

## 2022-04-06 DIAGNOSIS — Z5321 Procedure and treatment not carried out due to patient leaving prior to being seen by health care provider: Secondary | ICD-10-CM | POA: Insufficient documentation

## 2022-04-06 DIAGNOSIS — Z91040 Latex allergy status: Secondary | ICD-10-CM | POA: Insufficient documentation

## 2022-04-06 DIAGNOSIS — Z0279 Encounter for issue of other medical certificate: Secondary | ICD-10-CM | POA: Insufficient documentation

## 2022-04-06 LAB — RAPID URINE DRUG SCREEN, HOSP PERFORMED
Amphetamines: NOT DETECTED
Barbiturates: NOT DETECTED
Benzodiazepines: NOT DETECTED
Cocaine: NOT DETECTED
Opiates: NOT DETECTED
Tetrahydrocannabinol: POSITIVE — AB

## 2022-04-06 LAB — RESP PANEL BY RT-PCR (FLU A&B, COVID) ARPGX2
Influenza A by PCR: NEGATIVE
Influenza B by PCR: NEGATIVE
SARS Coronavirus 2 by RT PCR: NEGATIVE

## 2022-04-06 NOTE — ED Notes (Addendum)
Pt walking through nurses station. She is not IVCed- RN talked with Dr. Jeraldine Loots. Pt is voluntary and free to leave. Pt calling ride from phone in room. Pt given her belongings and she got dressed. She left room at this time.

## 2022-04-06 NOTE — ED Notes (Addendum)
Pt left room screaming "oww ouch" when tech stuck her for blood work. Was smacking her hand. She was redirected back to room at this time.

## 2022-04-06 NOTE — ED Provider Notes (Signed)
[facility]-EMERGENCY DEPT Provider Note   CSN: [id number] Arrival date & time: 04/06/22  1846     History  Chief Complaint  Patient presents with   Medical Clearance   Psychiatric Evaluation    Barbara Gordon is a 31 y.o. female.  Patient was brought to hospital by friends.  Patient states she has no complaints and does not know why she is here.  Reportedly friends told her she needed medicine.  Counselor from [employer] and stated the patient is here voluntarily for evaluation.  She was assessed at [facility] and was recommended for inpatient treatment.  Patient in emergency department for medical clearance.  She denies SI, HI, auditory and visual hallucinations.  She answers questions with yes, no, and I do not know.  Denies any complaints at this time to me.  HPI     Home Medications Prior to Admission medications   Medication Sig Start Date End Date Taking? Authorizing Provider  benztropine (COGENTIN) 0.5 MG tablet Take 1 tablet (0.5 mg total) by mouth 2 (two) times daily as needed for up to 30 doses for tremors (EPS). 01/30/22   Lauro Franklin, MD  FLUoxetine (PROZAC) 10 MG capsule Take 1 capsule (10 mg total) by mouth daily. 01/31/22 03/06/22  Lauro Franklin, MD  haloperidol (HALDOL) 10 MG tablet Take 1 tablet (10 mg total) by mouth at bedtime. 01/30/22 03/06/22  Lauro Franklin, MD  lamoTRIgine (LAMICTAL) 25 MG tablet Take 1 tablet (25 mg total) by mouth daily for 4 days. Patient not taking: Reported on 03/02/2022 01/31/22   Lauro Franklin, MD  lamoTRIgine (LAMICTAL) 25 MG tablet Take 2 tablets (50 mg total) by mouth daily for 14 days. Patient not taking: Reported on 03/02/2022 02/03/22 02/18/22  Lauro Franklin, MD  liothyronine (CYTOMEL) 25 MCG tablet Take 1 tablet (25 mcg total) by mouth daily. 01/31/22 03/06/22  Lauro Franklin, MD  nicotine polacrilex (NICORETTE) 2 MG gum Take 1 each (2 mg total) by mouth as needed for smoking  cessation. 01/30/22   Lauro Franklin, MD  thiamine 100 MG tablet Take 1 tablet (100 mg total) by mouth daily. 01/31/22   Lauro Franklin, MD  Vitamin D, Ergocalciferol, (DRISDOL) 1.25 MG (50000 UNIT) CAPS capsule Take 1 capsule (50,000 Units total) by mouth every 7 (seven) days. Patient not taking: Reported on 03/02/2022 02/05/22   Lauro Franklin, MD      Allergies    Latex and Tramadol    Review of Systems   Review of Systems  Physical Exam Updated Vital Signs BP (!) 136/103 (BP Location: Left Arm)   Pulse (!) 101   Temp 98.2 F (36.8 C) (Oral)   Resp 16   SpO2 98%  Physical Exam  ED Results / Procedures / Treatments   Labs (all labs ordered are listed, but only abnormal results are displayed) Labs Reviewed  RAPID URINE DRUG SCREEN, HOSP PERFORMED - Abnormal; Notable for the following components:      Result Value   Tetrahydrocannabinol POSITIVE (*)    All other components within normal limits  RESP PANEL BY RT-PCR (FLU A&B, COVID) ARPGX2  COMPREHENSIVE METABOLIC PANEL  ETHANOL  CBC WITH DIFFERENTIAL/PLATELET  I-STAT BETA HCG BLOOD, ED ([facility], [facility], [facility] ONLY)    EKG None  Radiology No results found.  Procedures Procedures    Medications Ordered in ED Medications - No data to display  ED Course/ Medical Decision Making/ A&P  Medical Decision Making Amount and/or Complexity of Data Reviewed Labs: ordered.   Patient was in the department voluntarily.  She initially agreed to medical screening labs but decided to leave prior to treatment after staff attempted to draw labs.  Patient left the hospital prior to medical clearance.  Final Clinical Impression(s) / ED Diagnoses Final diagnoses:  Medical clearance for psychiatric admission    Rx / DC Orders ED Discharge Orders     None         Pamala Duffel 04/06/22 2026    Gerhard Munch, MD 04/06/22 2235

## 2022-04-06 NOTE — BH Assessment (Signed)
@  2058, Clinician informed by nursing staff that pt left the ED AMA.

## 2022-04-06 NOTE — ED Notes (Signed)
Pt is cooperative but restless- keeps standing at door. Is redirectable.
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
## 2022-04-06 NOTE — ED Triage Notes (Signed)
Pt was brought in by her friends who told her that she "needed medicine". Pt denies any need to be here. Pt is aggravated and throwing her clothes and shoes. Pt denies SI and HI

## 2022-04-06 NOTE — H&P (Signed)
Behavioral Health Medical Screening Exam  Barbara Gordon is a 31 y. o. Caucasian female who presented as a voluntary walk-in to this [facility] accompanied by a female friend [name] with complaint of inability go  to sleep. Patient has past psychiatric hx / Medical hx of Polysubstance use, substance induced disorder, Schizophrenia, Hx of seizures, high risk for self harm, borderline personality d/o, elevated liver enzyme, Epidural hematoma, malingering, self inflicted injury, Brief psychotic d/o. Patient has 1 child who lives with her mother, while patient lives with [name].    When asked what brought her to the hospital, stated, "just came here, I want to go to sleep." When asked how long the symptoms has been going on stated, "I don't know." From here on, answers to most of the history questions are "I do not know, Yes, No, and a little bit." Pt is noted to be a poor historian and unable to obtain adequate HPI.  On face to face assessment, mood is dysphoric, she is irritable, anxious, angry, restless & agitated with questions being asked. Affect is congruent. She is bizarre, attempts to get up repeatedly asks why she is being asked questions & rationale is given to her multiple times, with the same result. She is a poor historian and provides limited information regarding her past medical /mental health history and current presentation. When asked to wait to to figure out her plan of care, patient was observed to come out of the screen room screaming. I need medication to help me go to sleep. Attempted to undress on the hallway and forcing herself against the locked door. With this bizarre behavior, her friend [name] was called from the lobby to sit with the patient. This behavior were repeated x 3. [facility] charge nurse was called and report provided. Patient was transferred to [facility], for stabilization. [facility] to [facility] via [facility].   Total Time spent with patient: 1  hour  Psychiatric Specialty Exam:  Presentation  General Appearance: Appropriate for Environment; Casual; Fairly Groomed  Eye Contact:Fleeting  Speech:Clear and Coherent; Pressured  Speech Volume:Normal  Handedness:Right  Mood and Affect  Mood:Anxious; Depressed; Dysphoric  Affect:Inappropriate  Thought Process  Thought Processes:Disorganized  Descriptions of Associations:Circumstantial  Orientation:Full (Time, Place and Person)  Thought Content:Illogical; Tangential  History of Schizophrenia/Schizoaffective disorder:Yes  Duration of Psychotic Symptoms:Greater than six months  Hallucinations:Hallucinations: None  Ideas of Reference:None  Suicidal Thoughts:Suicidal Thoughts: No  Homicidal Thoughts:Homicidal Thoughts: No   Sensorium  Memory:Immediate Poor; Recent Poor; Remote Poor  Judgment:Poor  Insight:Lacking  Executive Functions  Concentration:Poor  Attention Span:Poor  Recall:Poor  Fund of Knowledge:Poor  Language:Fair  Psychomotor Activity  Psychomotor Activity:Psychomotor Activity: Normal  Assets  Assets:Communication Skills; Physical Health; Social Support  Sleep  Sleep:Sleep: Poor Number of Hours of Sleep: 0 (Crying for sleep medication to be given.)  Physical Exam: Physical Exam Vitals and nursing note reviewed.  Constitutional:      Appearance: Normal appearance.  HENT:     Head: Normocephalic and atraumatic.     Comments: Hx of head injury    Right Ear: External ear normal.     Left Ear: External ear normal.     Nose: Nose normal.     Mouth/Throat:     Mouth: Mucous membranes are moist.     Pharynx: Oropharynx is clear.  Eyes:     Extraocular Movements: Extraocular movements intact.     Conjunctiva/sclera: Conjunctivae normal.     Pupils: Pupils are equal, round,  and reactive to light.  Cardiovascular:     Rate and Rhythm: Tachycardia present.     Pulses: Normal pulses.     Comments: 136/121, P 101  Pulmonary:      Effort: Pulmonary effort is normal.  Abdominal:     Palpations: Abdomen is soft.  Genitourinary:    Comments: Deferred Musculoskeletal:        General: Normal range of motion.     Cervical back: Normal range of motion and neck supple.  Skin:    General: Skin is warm.  Neurological:     General: No focal deficit present.     Mental Status: She is alert and oriented to person, place, and time.  Psychiatric:     Comments: Erratic behavior. Opened screen door, came out screaming for sleep medication. Attempted to get back into the screen room by slamming her body against the locked door. Continued this x 3. Tx team had to get her chaperon to the screen room to calm her down.   Review of Systems  Constitutional: Negative.  Negative for chills and fever.  HENT: Negative.  Negative for hearing loss and tinnitus.   Eyes: Negative.  Negative for blurred vision and double vision.  Respiratory: Negative.  Negative for cough, sputum production, shortness of breath and wheezing.   Cardiovascular: Negative.  Negative for chest pain and palpitations.       136/121, P 101  Gastrointestinal: Negative.  Negative for abdominal pain, constipation, diarrhea, heartburn, nausea and vomiting.  Genitourinary:  Negative for dysuria, frequency and urgency.  Musculoskeletal: Negative.  Negative for back pain, falls, joint pain, myalgias and neck pain.  Skin: Negative.  Negative for rash.  Neurological:  Positive for seizures (Hx of seizures). Negative for dizziness, tingling, tremors, sensory change, speech change, focal weakness, loss of consciousness, weakness and headaches.  Endo/Heme/Allergies: Negative.  Negative for environmental allergies and polydipsia. Does not bruise/bleed easily.  Psychiatric/Behavioral:  Positive for substance abuse. The patient is nervous/anxious and has insomnia.   There were no vitals taken for this visit. There is no height or weight on file to calculate  BMI.  Musculoskeletal: Strength & Muscle Tone: within normal limits Gait & Station: normal Patient leans: N/A  Recommendations:  Based on my evaluation the patient appears to have an emergency medical condition for which I recommend the patient be transferred to the emergency department for further evaluation.  Cecilie Lowers, FNP 04/06/2022, 6:47 PM

## 2022-04-08 ENCOUNTER — Encounter (HOSPITAL_COMMUNITY): Payer: Self-pay

## 2022-04-08 ENCOUNTER — Other Ambulatory Visit: Payer: Self-pay

## 2022-04-08 ENCOUNTER — Inpatient Hospital Stay (HOSPITAL_COMMUNITY)
Admission: EM | Admit: 2022-04-08 | Discharge: 2022-04-17 | DRG: 917 | Disposition: A | Discharge status: Other Acute Inpatient Hospital | Payer: Self-pay | Attending: Internal Medicine | Admitting: Internal Medicine

## 2022-04-08 DIAGNOSIS — G4700 Insomnia, unspecified: Secondary | ICD-10-CM | POA: Diagnosis present

## 2022-04-08 DIAGNOSIS — E559 Vitamin D deficiency, unspecified: Secondary | ICD-10-CM | POA: Diagnosis present

## 2022-04-08 DIAGNOSIS — G928 Other toxic encephalopathy: Secondary | ICD-10-CM | POA: Diagnosis present

## 2022-04-08 DIAGNOSIS — T50901A Poisoning by unspecified drugs, medicaments and biological substances, accidental (unintentional), initial encounter: Principal | ICD-10-CM | POA: Diagnosis present

## 2022-04-08 DIAGNOSIS — E872 Acidosis, unspecified: Secondary | ICD-10-CM | POA: Diagnosis present

## 2022-04-08 DIAGNOSIS — R45851 Suicidal ideations: Secondary | ICD-10-CM | POA: Diagnosis present

## 2022-04-08 DIAGNOSIS — Z79899 Other long term (current) drug therapy: Secondary | ICD-10-CM

## 2022-04-08 DIAGNOSIS — R748 Abnormal levels of other serum enzymes: Secondary | ICD-10-CM | POA: Diagnosis present

## 2022-04-08 DIAGNOSIS — F12959 Cannabis use, unspecified with psychotic disorder, unspecified: Secondary | ICD-10-CM

## 2022-04-08 DIAGNOSIS — Z20822 Contact with and (suspected) exposure to covid-19: Secondary | ICD-10-CM | POA: Diagnosis present

## 2022-04-08 DIAGNOSIS — N19 Unspecified kidney failure: Secondary | ICD-10-CM | POA: Diagnosis present

## 2022-04-08 DIAGNOSIS — Z91199 Patient's noncompliance with other medical treatment and regimen due to unspecified reason: Secondary | ICD-10-CM

## 2022-04-08 DIAGNOSIS — F1410 Cocaine abuse, uncomplicated: Secondary | ICD-10-CM | POA: Diagnosis present

## 2022-04-08 DIAGNOSIS — F23 Brief psychotic disorder: Secondary | ICD-10-CM | POA: Diagnosis present

## 2022-04-08 DIAGNOSIS — F17210 Nicotine dependence, cigarettes, uncomplicated: Secondary | ICD-10-CM | POA: Diagnosis present

## 2022-04-08 DIAGNOSIS — E0590 Thyrotoxicosis, unspecified without thyrotoxic crisis or storm: Secondary | ICD-10-CM | POA: Diagnosis present

## 2022-04-08 DIAGNOSIS — E039 Hypothyroidism, unspecified: Secondary | ICD-10-CM | POA: Diagnosis present

## 2022-04-08 DIAGNOSIS — R102 Pelvic and perineal pain: Secondary | ICD-10-CM | POA: Diagnosis present

## 2022-04-08 DIAGNOSIS — M6282 Rhabdomyolysis: Secondary | ICD-10-CM | POA: Diagnosis present

## 2022-04-08 DIAGNOSIS — F200 Paranoid schizophrenia: Secondary | ICD-10-CM | POA: Diagnosis present

## 2022-04-08 DIAGNOSIS — F29 Unspecified psychosis not due to a substance or known physiological condition: Principal | ICD-10-CM

## 2022-04-08 DIAGNOSIS — G9340 Encephalopathy, unspecified: Secondary | ICD-10-CM | POA: Diagnosis present

## 2022-04-08 DIAGNOSIS — F12159 Cannabis abuse with psychotic disorder, unspecified: Secondary | ICD-10-CM | POA: Diagnosis present

## 2022-04-08 HISTORY — DX: Hypothyroidism, unspecified: E03.9

## 2022-04-08 HISTORY — DX: Other psychoactive substance abuse, uncomplicated: F19.10

## 2022-04-08 HISTORY — DX: Schizophrenia, unspecified: F20.9

## 2022-04-08 LAB — COMPREHENSIVE METABOLIC PANEL
ALT: 39 U/L (ref 0–44)
AST: 32 U/L (ref 15–41)
Albumin: 5.3 g/dL — ABNORMAL HIGH (ref 3.5–5.0)
Alkaline Phosphatase: 47 U/L (ref 38–126)
Anion gap: 10 (ref 5–15)
BUN: 13 mg/dL (ref 6–20)
CO2: 21 mmol/L — ABNORMAL LOW (ref 22–32)
Calcium: 10 mg/dL (ref 8.9–10.3)
Chloride: 107 mmol/L (ref 98–111)
Creatinine, Ser: 0.68 mg/dL (ref 0.44–1.00)
GFR, Estimated: >60 mL/min (ref >60)
Glucose, Bld: 82 mg/dL (ref 70–99)
Potassium: 4.4 mmol/L (ref 3.5–5.1)
Sodium: 138 mmol/L (ref 135–145)
Total Bilirubin: 1 mg/dL (ref 0.3–1.2)
Total Protein: 9.2 g/dL — ABNORMAL HIGH (ref 6.5–8.1)

## 2022-04-08 LAB — ACETAMINOPHEN LEVEL: Acetaminophen (Tylenol), Serum: <10 ug/mL — ABNORMAL LOW (ref 10–30)

## 2022-04-08 LAB — CBC WITH DIFFERENTIAL/PLATELET
Abs Immature Granulocytes: 0.04 10*3/uL (ref 0.00–0.07)
Basophils Absolute: 0.1 10*3/uL (ref 0.0–0.1)
Basophils Relative: 1 %
Eosinophils Absolute: 0.3 10*3/uL (ref 0.0–0.5)
Eosinophils Relative: 4 %
HCT: 45.9 % (ref 36.0–46.0)
Hemoglobin: 16.1 g/dL — ABNORMAL HIGH (ref 12.0–15.0)
Immature Granulocytes: 1 %
Lymphocytes Relative: 28 %
Lymphs Abs: 2.4 10*3/uL (ref 0.7–4.0)
MCH: 32.3 pg (ref 26.0–34.0)
MCHC: 35.1 g/dL (ref 30.0–36.0)
MCV: 92 fL (ref 80.0–100.0)
Monocytes Absolute: 0.5 10*3/uL (ref 0.1–1.0)
Monocytes Relative: 6 %
Neutro Abs: 5.2 10*3/uL (ref 1.7–7.7)
Neutrophils Relative %: 60 %
Platelets: 343 10*3/uL (ref 150–400)
RBC: 4.99 MIL/uL (ref 3.87–5.11)
RDW: 12.4 % (ref 11.5–15.5)
WBC: 8.6 10*3/uL (ref 4.0–10.5)
nRBC: 0 % (ref 0.0–0.2)

## 2022-04-08 LAB — RAPID URINE DRUG SCREEN, HOSP PERFORMED
Amphetamines: NOT DETECTED
Barbiturates: NOT DETECTED
Benzodiazepines: NOT DETECTED
Cocaine: NOT DETECTED
Opiates: NOT DETECTED
Tetrahydrocannabinol: POSITIVE — AB

## 2022-04-08 LAB — CBG MONITORING, ED: Glucose-Capillary: 153 mg/dL — ABNORMAL HIGH (ref 70–99)

## 2022-04-08 LAB — SARS CORONAVIRUS 2 BY RT PCR: SARS Coronavirus 2 by RT PCR: NEGATIVE

## 2022-04-08 LAB — I-STAT BETA HCG BLOOD, ED (MC, WL, AP ONLY): I-stat hCG, quantitative: <5 m[IU]/mL (ref <5)

## 2022-04-08 LAB — ETHANOL: Alcohol, Ethyl (B): <10 mg/dL (ref <10)

## 2022-04-08 LAB — SALICYLATE LEVEL: Salicylate Lvl: <7 mg/dL — ABNORMAL LOW (ref 7.0–30.0)

## 2022-04-08 MED ORDER — HALOPERIDOL 5 MG PO TABS
10.0000 mg | ORAL_TABLET | Freq: Every day | ORAL | Status: DC
  Administered 2022-04-08 – 2022-04-15 (×8): 10 mg via ORAL
  Filled 2022-04-08 (×9): qty 2

## 2022-04-08 MED ORDER — ACETAMINOPHEN 325 MG PO TABS: 650.0000 mg | ORAL_TABLET | ORAL | Status: DC | PRN

## 2022-04-08 MED ORDER — ONDANSETRON HCL 4 MG PO TABS
4.0000 mg | ORAL_TABLET | Freq: Three times a day (TID) | ORAL | Status: DC | PRN
Start: 2022-04-08 — End: 2022-04-17
  Administered 2022-04-14: 4 mg via ORAL
  Filled 2022-04-08: qty 1

## 2022-04-08 MED ORDER — LAMOTRIGINE 25 MG PO TABS
50.0000 mg | ORAL_TABLET | Freq: Every day | ORAL | Status: DC
  Administered 2022-04-08 – 2022-04-14 (×7): 50 mg via ORAL
  Filled 2022-04-08 (×7): qty 2

## 2022-04-08 MED ORDER — ALUM & MAG HYDROXIDE-SIMETH 200-200-20 MG/5ML PO SUSP
30.0000 mL | Freq: Four times a day (QID) | ORAL | Status: DC | PRN
  Administered 2022-04-12 – 2022-04-15 (×4): 30 mL via ORAL
  Filled 2022-04-08 (×4): qty 30

## 2022-04-08 MED ORDER — FLUOXETINE HCL 10 MG PO CAPS
10.0000 mg | ORAL_CAPSULE | Freq: Every day | ORAL | Status: DC
  Administered 2022-04-08 – 2022-04-09 (×2): 10 mg via ORAL
  Filled 2022-04-08 (×2): qty 1

## 2022-04-08 MED ORDER — LIOTHYRONINE SODIUM 25 MCG PO TABS
25.0000 ug | ORAL_TABLET | Freq: Every day | ORAL | Status: DC
  Administered 2022-04-08 – 2022-04-15 (×8): 25 ug via ORAL
  Filled 2022-04-08 (×9): qty 1

## 2022-04-08 MED ORDER — BENZTROPINE MESYLATE 0.5 MG PO TABS: 0.5000 mg | ORAL_TABLET | Freq: Two times a day (BID) | ORAL | Status: DC | PRN

## 2022-04-08 MED ORDER — HALOPERIDOL LACTATE 5 MG/ML IJ SOLN
5.0000 mg | Freq: Once | INTRAMUSCULAR | Status: AC
  Administered 2022-04-08: 5 mg via INTRAMUSCULAR
  Filled 2022-04-08: qty 1

## 2022-04-08 MED ORDER — LORAZEPAM 2 MG/ML IJ SOLN
2.0000 mg | Freq: Once | INTRAMUSCULAR | Status: AC
  Administered 2022-04-08: 2 mg via INTRAMUSCULAR
  Filled 2022-04-08: qty 1

## 2022-04-08 MED ORDER — THIAMINE HCL 100 MG PO TABS
100.0000 mg | ORAL_TABLET | Freq: Every day | ORAL | Status: DC
  Administered 2022-04-08 – 2022-04-17 (×10): 100 mg via ORAL
  Filled 2022-04-08 (×10): qty 1

## 2022-04-08 MED ORDER — NICOTINE POLACRILEX 2 MG MT GUM
2.0000 mg | CHEWING_GUM | OROMUCOSAL | Status: DC | PRN
  Administered 2022-04-09 – 2022-04-15 (×6): 2 mg via ORAL
  Filled 2022-04-08 (×8): qty 1

## 2022-04-08 NOTE — BH Assessment (Signed)
Comprehensive Clinical Assessment (CCA) Screening, Triage and Referral Note  04/08/2022 Barbara Gordon [id number]  Disposition: Per Nira ConnJason Berry, NP, pt recommended for inpatient treatment.   Flowsheet Row ED from [facility]08/2022 [facility] from [facility] from 03/01/2022 in [facility]  C-SSRS RISK CATEGORY No Risk No Risk No Risk      The patient demonstrates the following risk factors for suicide: Chronic risk factors for suicide include: N/A. Acute risk factors for suicide include: family or marital conflict. Protective factors for this patient include: positive social support. Considering these factors, the overall suicide risk at this point appears to be low. Patient is not appropriate for outpatient follow up.   Barbara Gordon is a 31 year old female [facility] with a friend requesting a psychiatric evaluation. Patient states she does not know why she is in the ED and does not know why her friend wanted her to come to the ED. Patient only answering simple questions with yes, no, or I don't know. Patient denies SI, HI, AVH and SIB. Patient reports THC use yesterday morning and when asked follow up questions about substance use patient reports I don't know. Patient living with her friend Barbara Gordon and his mother. Patient does not have outpatient services, denies history of inpatient treatment and is not taking any psychotropic medications.   Patient does not know what city she is in currently, could not tell me the date or year but she is oriented to person. Patient legs are restless during assessment, and she presents with motor agitation. Patient increasingly becomes agitated and ultimately starts to scream "ow, please stop it". Patient seen thrashing in the bed screaming "ow" when asked what is hurting or who she is telling to stop she replies, "I don't know".  Patient states "I'm just really tired". Patient consents for TTS to contact her friend Barbara Gordon for collateral.   Barbara Gordon reports that patient started exhibiting behaviors of "thrashing, screaming ow and having anxiety about four days ago. Barbara Gordon reports patient received a letter stating that she will have to pay money to her grandmother for keeping her child. Barbara Gordon reports patient stopped talking for a while, became overwhelmed and then started reporting being in pain and not sleeping. Barbara Gordon reports patient would lay in bed with her eyes open and when she was able to sleep she would wake up during the night screaming ow. Barbara Gordon has taken patient to the hospital 2-3 times in the past few days to get patient help. Barbara Gordon reports that patient has been living with him and his mother for 6 months and prior to that she was living on the streets. Barbara Gordon reports that patient has a TBI and diagnosed with BPD. Barbara Gordon stated that he was unable to get patient stabilized long enough to get her to follow up with outpatient services.   MSE completed on 04/06/22 "Barbara Gordon is a 31 y. o. Caucasian female who [facility] as a [facility] accompanied by a female friend Barbara Gordon with complaint of inability go  to sleep. Patient has past psychiatric hx / Medical hx of Polysubstance use, substance induced disorder, Schizophrenia, Hx of seizures, high risk for self harm, borderline personality d/o, elevated liver enzyme, Epidural hematoma, malingering, self inflicted injury, Brief psychotic d/o. Patient has 1 child who lives with her mother, while patient lives with Barbara Gordon's mother.     When asked what brought her to  the hospital, stated, "just came here, I want to go to sleep." When asked how long the symptoms has been going on stated, "I don't know." From here on, answers to most of the history questions are "I do not know, Yes, No, and a little bit." Pt is noted to be a poor historian and unable  to obtain adequate HPI.   On face to face assessment, mood is dysphoric, she is irritable, anxious, angry, restless & agitated with questions being asked. Affect is congruent. She is bizarre, attempts to get up repeatedly asks why she is being asked questions & rationale is given to her multiple times, with the same result. She is a poor historian and provides limited information regarding her past medical /mental health history and current presentation. When asked to wait to to figure out her plan of care, patient was observed to come out of the screen room screaming. I need medication to help me go to sleep. Attempted to undress on the hallway and forcing herself against the locked door. With this bizarre behavior, her friend Barbara Gordon was called from the lobby to sit with the patient. This behavior were repeated x 3. [facility] charge nurse was called and report provided. Patient was transferred to [facility], for [facility]. Patient [facility] via [facility]."   Chief Complaint:  Chief Complaint  Patient presents with   Psychiatric Evaluation   Visit Diagnosis: Psychosis, unspecified psychosis type [facility])  Patient Reported Information How did you hear about [name]? Family/Friend  What Is the Reason for Your Visit/Call Today? Barbara Gordon is a 31 y.o. female.  Patient is unable to provide any history.  She admits that she was dropped off by a friend in the triage note since she needs help.  Patient is not sure why she is here.  She is very labile and intermittently screams out.  She denies any acute medical complaints.  She has a history of polysubstance abuse schizophrenia and has multiple psychiatric evaluations.  Last seen here yesterday and was seen by psychiatry and cleared.  At that time she was noted to be screaming "ouch" during questioning on physical exam.  She cannot identify what is bothering her.  How Long Has This Been Causing You Problems? 1 wk - 1 month  What Do You Feel Would Help  You the Most Today? Treatment for Depression or other mood problem   Have You Recently Had Any Thoughts About Hurting Yourself? No  Are You Planning to Commit Suicide/Harm Yourself At This time? No   Have you Recently Had Thoughts About Hurting Someone Barbara Gordon? No  Are You Planning to Harm Someone at This Time? No  Explanation: No data recorded  Have You Used Any Alcohol or Drugs in the Past 24 Hours? No  How Long Ago Did You Use Drugs or Alcohol? No data recorded What Did You Use and How Much? Patient has a hx of THC use. However, denies history in today's assessment. Also, denies any other addtional history in today's TTS assessment.   Do You Currently Have a Therapist/Psychiatrist? No  Name of Therapist/Psychiatrist: No data recorded  Have You Been Recently Discharged From Any Office Practice or Programs? No  Explanation of Discharge From Practice/Program: [facility] 01/18/22 to 01/30/22    CCA Screening Triage Referral Assessment Type of Contact: Tele-Assessment  Telemedicine Service Delivery: Telemedicine service delivery: This service was provided via telemedicine using a 2-way, interactive audio and video technology  Is this Initial or Reassessment? Initial Assessment  Date Telepsych consult ordered in [facility]:  04/08/22  Time Telepsych consult ordered in [facility]:  2233  Location of Assessment: [facility]  Provider Location: [facility] Assessment Services   Collateral Involvement: Barbara Gordon pt friend   Does Patient Have a Automotive engineer Guardian? No data recorded Name and Contact of Legal Guardian: No data recorded If Minor and Not Living with Parent(s), Who has Custody? N/A  Is CPS involved or ever been involved? Never  Is APS involved or ever been involved? Never   Patient Determined To Be At Risk for Harm To Self or Others Based on Review of Patient Reported Information or Presenting Complaint? No  Method: No data recorded Availability of Means: No data recorded Intent:  No data recorded Notification Required: No data recorded Additional Information for Danger to Others Potential: No data recorded Additional Comments for Danger to Others Potential: No data recorded Are There Guns or Other Weapons in Your Home? No data recorded Types of Guns/Weapons: No data recorded Are These Weapons Safely Secured?                            No data recorded Who Could Verify You Are Able To Have These Secured: No data recorded Do You Have any Outstanding Charges, Pending Court Dates, Parole/Probation? No data recorded Contacted To Inform of Risk of Harm To Self or Others: -- (N/A)   Does Patient Present under Involuntary Commitment? No  IVC Papers Initial File Date: 01/15/22   [place] of Residence: [place]   Patient Currently Receiving the Following Services: Not Receiving Services   Determination of Need: Urgent (48 hours)   Options For Referral: Medication Management; Outpatient Therapy; Inpatient Hospitalization   Discharge Disposition:     Audree Camel, [facility]

## 2022-04-08 NOTE — ED Notes (Signed)
This RN attempted to stick pt for blood work twice. Pt unable to stay still long enough for blood draw. Pt pacing in room refusing blood work at this time. Pt continuing to scream "Ow" and yell "stop" even when no one is in the room.

## 2022-04-08 NOTE — ED Triage Notes (Signed)
Pt BIB friend, he states that pt needs help. Pt is is cooperating but not talking much.

## 2022-04-08 NOTE — ED Notes (Signed)
[name], patient's friend, [phone]

## 2022-04-08 NOTE — BH Assessment (Signed)
Attempted to assess patient, but she had been sedated.

## 2022-04-08 NOTE — ED Notes (Signed)
TTS on going at this time  

## 2022-04-08 NOTE — ED Notes (Signed)
Patient became agitated with visitor. Visitor left and patient was redirected and provided with water.

## 2022-04-08 NOTE — ED Notes (Signed)
Patient awake and alert. Provided with water and sandwich.

## 2022-04-08 NOTE — ED Provider Notes (Signed)
[facility] Provider Note   CSN: [id number] Arrival date & time: 04/08/22  4627     History  Chief Complaint  Patient presents with   Psychiatric Evaluation    Barbara Gordon is a 31 y.o. female.  Patient is unable to provide any history.  She admits that she was dropped off by a friend in the triage note since she needs help.  Patient is not sure why she is here.  She is very labile and intermittently screams out.  She denies any acute medical complaints.  She has a history of polysubstance abuse schizophrenia and has multiple psychiatric evaluations.  Last seen here yesterday and was seen by psychiatry and cleared.  At that time she was noted to be screaming "ouch" during questioning on physical exam.  She cannot identify what is bothering her.  The history is provided by the patient.  Mental Health Problem Presenting symptoms: agitation and bizarre behavior   Onset quality:  Unable to specify Timing:  Unable to specify Progression:  Unchanged Risk factors: hx of mental illness and recent psychiatric admission       Home Medications Prior to Admission medications   Medication Sig Start Date End Date Taking? Authorizing Provider  benztropine (COGENTIN) 0.5 MG tablet Take 1 tablet (0.5 mg total) by mouth 2 (two) times daily as needed for up to 30 doses for tremors (EPS). 01/30/22   Lauro Franklin, MD  FLUoxetine (PROZAC) 10 MG capsule Take 1 capsule (10 mg total) by mouth daily. 01/31/22 03/06/22  Lauro Franklin, MD  haloperidol (HALDOL) 10 MG tablet Take 1 tablet (10 mg total) by mouth at bedtime. 01/30/22 03/06/22  Lauro Franklin, MD  lamoTRIgine (LAMICTAL) 25 MG tablet Take 1 tablet (25 mg total) by mouth daily for 4 days. Patient not taking: Reported on 03/02/2022 01/31/22   Lauro Franklin, MD  lamoTRIgine (LAMICTAL) 25 MG tablet Take 2 tablets (50 mg total) by mouth daily for 14 days. Patient not taking: Reported on  03/02/2022 02/03/22 02/18/22  Lauro Franklin, MD  liothyronine (CYTOMEL) 25 MCG tablet Take 1 tablet (25 mcg total) by mouth daily. 01/31/22 03/06/22  Lauro Franklin, MD  nicotine polacrilex (NICORETTE) 2 MG gum Take 1 each (2 mg total) by mouth as needed for smoking cessation. 01/30/22   Lauro Franklin, MD  thiamine 100 MG tablet Take 1 tablet (100 mg total) by mouth daily. 01/31/22   Lauro Franklin, MD  Vitamin D, Ergocalciferol, (DRISDOL) 1.25 MG (50000 UNIT) CAPS capsule Take 1 capsule (50,000 Units total) by mouth every 7 (seven) days. Patient not taking: Reported on 03/02/2022 02/05/22   Lauro Franklin, MD      Allergies    Latex and Tramadol    Review of Systems   Review of Systems  Unable to perform ROS: Mental status change  Psychiatric/Behavioral:  Positive for agitation.    Physical Exam Updated Vital Signs BP (!) 152/126 (BP Location: Left Arm) Comment: pt wouldnt stay still to obtain vitals  Pulse (!) 135   Temp 99.3 F (37.4 C) (Oral)   Resp 20   SpO2 97%  Physical Exam Vitals and nursing note reviewed.  Constitutional:      General: She is not in acute distress.    Appearance: Normal appearance. She is well-developed.  HENT:     Head: Normocephalic and atraumatic.  Eyes:     Conjunctiva/sclera: Conjunctivae normal.  Cardiovascular:     Rate and  Rhythm: Regular rhythm. Tachycardia present.     Heart sounds: No murmur heard. Pulmonary:     Effort: Pulmonary effort is normal. No respiratory distress.     Breath sounds: Normal breath sounds. No stridor. No wheezing.  Abdominal:     Palpations: Abdomen is soft.     Tenderness: There is no abdominal tenderness. There is no guarding or rebound.  Musculoskeletal:        General: No tenderness or deformity. Normal range of motion.     Cervical back: Neck supple.  Skin:    General: Skin is warm and dry.  Neurological:     General: No focal deficit present.     GCS: GCS eye subscore is 4.  GCS verbal subscore is 5. GCS motor subscore is 6.  Psychiatric:        Mood and Affect: Mood is anxious.        Behavior: Behavior is agitated.    ED Results / Procedures / Treatments   Labs (all labs ordered are listed, but only abnormal results are displayed) Labs Reviewed  COMPREHENSIVE METABOLIC PANEL - Abnormal; Notable for the following components:      Result Value   CO2 21 (*)    Total Protein 9.2 (*)    Albumin 5.3 (*)    All other components within normal limits  SALICYLATE LEVEL - Abnormal; Notable for the following components:   Salicylate Lvl <7.0 (*)    All other components within normal limits  ACETAMINOPHEN LEVEL - Abnormal; Notable for the following components:   Acetaminophen (Tylenol), Serum <10 (*)    All other components within normal limits  RAPID URINE DRUG SCREEN, HOSP PERFORMED - Abnormal; Notable for the following components:   Tetrahydrocannabinol POSITIVE (*)    All other components within normal limits  CBC WITH DIFFERENTIAL/PLATELET - Abnormal; Notable for the following components:   Hemoglobin 16.1 (*)    All other components within normal limits  CBG MONITORING, ED - Abnormal; Notable for the following components:   Glucose-Capillary 153 (*)    All other components within normal limits  SARS CORONAVIRUS 2 BY RT PCR  ETHANOL  I-STAT BETA HCG BLOOD, ED ([facility], [facility], [facility] ONLY)    EKG EKG Interpretation  Date/Time:  Thursday April 08 2022 07:01:42 EDT Ventricular Rate:  128 PR Interval:  129 QRS Duration: 80 QT Interval:  302 QTC Calculation: 441 R Axis:   95 Text Interpretation: Sinus tachycardia Borderline right axis deviation Baseline wander in lead(s) II III aVF V4 V5 V6 increased rate from prior 4/23 Confirmed by Meridee ScoreButler, Pooja Camuso [phone]) on 04/08/2022 7:03:35 AM  Radiology No results found.  Procedures Procedures    Medications Ordered in ED Medications  benztropine (COGENTIN) tablet 0.5 mg (has no administration in time range)   FLUoxetine (PROZAC) capsule 10 mg (10 mg Oral Given 04/08/22 0942)  haloperidol (HALDOL) tablet 10 mg (has no administration in time range)  lamoTRIgine (LAMICTAL) tablet 50 mg (50 mg Oral Given 04/08/22 0942)  liothyronine (CYTOMEL) tablet 25 mcg (25 mcg Oral Given 04/08/22 1411)  nicotine polacrilex (NICORETTE) gum 2 mg (has no administration in time range)  thiamine tablet 100 mg (100 mg Oral Given 04/08/22 0942)  acetaminophen (TYLENOL) tablet 650 mg (has no administration in time range)  ondansetron (ZOFRAN) tablet 4 mg (has no administration in time range)  alum & mag hydroxide-simeth (MAALOX/MYLANTA) 200-200-20 MG/5ML suspension 30 mL (has no administration in time range)  haloperidol lactate (HALDOL) injection 5 mg (5 mg  Intramuscular Given 04/08/22 0942)  LORazepam (ATIVAN) injection 2 mg (2 mg Intramuscular Given 04/08/22 4917)    ED Course/ Medical Decision Making/ A&P Clinical Course as of 04/08/22 1727  Thu Apr 08, 2022  1013 Patient was seen by psychiatry yesterday and psychiatrically cleared.  Here is acting bizarre intermittently crying out although cannot identify if she is having any pain.  Evasive with answers.  Have ordered her baseline home medications and a dose of Haldol and Ativan for her agitation. [MB]  1026 Patient resting quietly in bed.  No distress.  Remains borderline tachycardic [MB]    Clinical Course User Index [MB] Terrilee Files, MD                           Medical Decision Making Risk OTC drugs. Prescription drug management.   This patient complains of psychiatric illness; this involves an extensive number of treatment Options and is a complaint that carries with it a high risk of complications and morbidity. The differential includes psychiatric illness, metabolic derangement, substance abuse, dehydration, medication noncompliance  I ordered, reviewed and interpreted labs, which included CBC with normal white count normal hemoglobin, chemistries with  mildly low bicarb, aspirin Tylenol alcohol negative, pregnancy negative, COVID-negative, tox screen positive for THC I ordered medication patient's home medication and IM Haldol and Ativan for agitation and reviewed PMP when indicated.  Previous records obtained and reviewed including recent ED and psychiatry evaluations I consulted TTS and discussed lab and imaging findings and discussed disposition.  Cardiac monitoring reviewed, normal sinus rhythm Social determinants considered, polysubstance abuse and medication noncompliance Critical Interventions: None  After the interventions stated above, I reevaluated the patient and found patient still to be quite bizarre in her behavior Admission and further testing considered, patient will benefit from psychiatric evaluation for possible inpatient stabilization.  Awaiting psychiatry recommendations.         Final Clinical Impression(s) / ED Diagnoses Final diagnoses:  Psychosis, unspecified psychosis type [facility])    Rx / DC Orders ED Discharge Orders     None         Terrilee Files, MD 04/08/22 1730

## 2022-04-09 ENCOUNTER — Encounter (HOSPITAL_COMMUNITY): Payer: Self-pay | Admitting: Internal Medicine

## 2022-04-09 DIAGNOSIS — E039 Hypothyroidism, unspecified: Secondary | ICD-10-CM

## 2022-04-09 DIAGNOSIS — F29 Unspecified psychosis not due to a substance or known physiological condition: Secondary | ICD-10-CM

## 2022-04-09 DIAGNOSIS — R748 Abnormal levels of other serum enzymes: Secondary | ICD-10-CM

## 2022-04-09 DIAGNOSIS — G9340 Encephalopathy, unspecified: Secondary | ICD-10-CM | POA: Diagnosis present

## 2022-04-09 DIAGNOSIS — N19 Unspecified kidney failure: Secondary | ICD-10-CM

## 2022-04-09 LAB — MAGNESIUM: Magnesium: 2.2 mg/dL (ref 1.7–2.4)

## 2022-04-09 LAB — BASIC METABOLIC PANEL
Anion gap: 12 (ref 5–15)
BUN: 18 mg/dL (ref 6–20)
CO2: 22 mmol/L (ref 22–32)
Calcium: 10 mg/dL (ref 8.9–10.3)
Chloride: 104 mmol/L (ref 98–111)
Creatinine, Ser: 0.71 mg/dL (ref 0.44–1.00)
GFR, Estimated: >60 mL/min (ref >60)
Glucose, Bld: 104 mg/dL — ABNORMAL HIGH (ref 70–99)
Potassium: 3.9 mmol/L (ref 3.5–5.1)
Sodium: 138 mmol/L (ref 135–145)

## 2022-04-09 LAB — T4, FREE: Free T4: 1 ng/dL (ref 0.61–1.12)

## 2022-04-09 LAB — RAPID URINE DRUG SCREEN, HOSP PERFORMED
Amphetamines: NOT DETECTED
Barbiturates: NOT DETECTED
Benzodiazepines: POSITIVE — AB
Cocaine: NOT DETECTED
Opiates: NOT DETECTED
Tetrahydrocannabinol: POSITIVE — AB

## 2022-04-09 LAB — CK: Total CK: 420 U/L — ABNORMAL HIGH (ref 38–234)

## 2022-04-09 LAB — TSH: TSH: 0.074 u[IU]/mL — ABNORMAL LOW (ref 0.350–4.500)

## 2022-04-09 MED ORDER — BENZTROPINE MESYLATE 0.5 MG PO TABS
0.5000 mg | ORAL_TABLET | Freq: Two times a day (BID) | ORAL | Status: DC
  Administered 2022-04-09 – 2022-04-17 (×17): 0.5 mg via ORAL
  Filled 2022-04-09 (×17): qty 1

## 2022-04-09 MED ORDER — LORAZEPAM 2 MG/ML IJ SOLN
1.0000 mg | Freq: Once | INTRAMUSCULAR | Status: AC
  Administered 2022-04-09: 1 mg via INTRAVENOUS
  Filled 2022-04-09: qty 1

## 2022-04-09 MED ORDER — ACETAMINOPHEN 650 MG RE SUPP: 650.0000 mg | Freq: Four times a day (QID) | RECTAL | Status: DC | PRN

## 2022-04-09 MED ORDER — BENZTROPINE MESYLATE 1 MG/ML IJ SOLN
1.0000 mg | Freq: Once | INTRAMUSCULAR | Status: AC
  Administered 2022-04-09: 1 mg via INTRAVENOUS
  Filled 2022-04-09: qty 2

## 2022-04-09 MED ORDER — DIPHENHYDRAMINE HCL 25 MG PO CAPS: 50.0000 mg | ORAL_CAPSULE | Freq: Four times a day (QID) | ORAL | Status: DC | PRN

## 2022-04-09 MED ORDER — PROPRANOLOL HCL 10 MG PO TABS
10.0000 mg | ORAL_TABLET | Freq: Two times a day (BID) | ORAL | Status: DC
  Administered 2022-04-09 – 2022-04-17 (×17): 10 mg via ORAL
  Filled 2022-04-09 (×17): qty 1

## 2022-04-09 MED ORDER — LACTATED RINGERS IV SOLN: INTRAVENOUS | Status: DC

## 2022-04-09 MED ORDER — ACETAMINOPHEN 325 MG PO TABS
650.0000 mg | ORAL_TABLET | Freq: Four times a day (QID) | ORAL | Status: DC | PRN
  Administered 2022-04-09 – 2022-04-17 (×15): 650 mg via ORAL
  Filled 2022-04-09 (×15): qty 2

## 2022-04-09 MED ORDER — BENZTROPINE MESYLATE 1 MG PO TABS
1.0000 mg | ORAL_TABLET | Freq: Two times a day (BID) | ORAL | Status: DC
  Administered 2022-04-09: 1 mg via ORAL
  Filled 2022-04-09: qty 1

## 2022-04-09 MED ORDER — LORAZEPAM 1 MG PO TABS
1.0000 mg | ORAL_TABLET | Freq: Once | ORAL | Status: AC
  Administered 2022-04-09: 1 mg via ORAL
  Filled 2022-04-09: qty 1

## 2022-04-09 MED ORDER — LORAZEPAM 1 MG PO TABS
1.0000 mg | ORAL_TABLET | ORAL | Status: DC | PRN
  Administered 2022-04-09 – 2022-04-10 (×4): 1 mg via ORAL
  Filled 2022-04-09 (×4): qty 1

## 2022-04-09 MED ORDER — ENOXAPARIN SODIUM 40 MG/0.4ML IJ SOSY
40.0000 mg | PREFILLED_SYRINGE | INTRAMUSCULAR | Status: DC
  Administered 2022-04-10 – 2022-04-17 (×7): 40 mg via SUBCUTANEOUS
  Filled 2022-04-09 (×7): qty 0.4

## 2022-04-09 MED ORDER — LORAZEPAM 1 MG PO TABS
1.0000 mg | ORAL_TABLET | ORAL | Status: DC | PRN
  Administered 2022-04-09: 1 mg via ORAL
  Filled 2022-04-09: qty 1

## 2022-04-09 MED ORDER — DIPHENHYDRAMINE HCL 50 MG/ML IJ SOLN
50.0000 mg | Freq: Once | INTRAMUSCULAR | Status: AC
  Administered 2022-04-09: 50 mg via INTRAVENOUS

## 2022-04-09 MED ORDER — LACTATED RINGERS IV BOLUS
2000.0000 mL | Freq: Once | INTRAVENOUS | Status: AC
  Administered 2022-04-09: 2000 mL via INTRAVENOUS

## 2022-04-09 MED ORDER — DIPHENHYDRAMINE HCL 50 MG/ML IJ SOLN
50.0000 mg | Freq: Four times a day (QID) | INTRAMUSCULAR | Status: DC | PRN
  Filled 2022-04-09: qty 1

## 2022-04-09 NOTE — Progress Notes (Signed)
This CSW completed an APS report [phone] after hours with Buzzy Han, SW. Report completed.   Benjaman Kindler, MSW, LCSWA 04/09/2022 11:15 PM

## 2022-04-09 NOTE — ED Notes (Signed)
Pt is moaning and appears to be calling out. Pt is lying in the bed. Pt right arm is straight and raised above her body. Pt raised her legs in the air and holding them in the air. Pt visibly sweating and face is red. Pt HR is 137. Contacting providers. Asked pt if Barbara Gordon gave her a pill or injection. Pt said yes but would not elaborate on type of substance or time it was given.

## 2022-04-09 NOTE — ED Notes (Addendum)
Rn requested EDP Roxanne Mins to evaluation d/t low SpO2 88-89% on room air. Pt placed on supplemental oxygen at 2L. Pt also flushed, diaphoretic, and tachycardic HR 110s-120s on assessment. Difficult to obtain full assessment as pt does not answer questions. Orders for PO ativan, meds given. Pt remains on 2 L of oxygen. SpO2 monitor remains in place. Sitter at bedside.

## 2022-04-09 NOTE — ED Notes (Signed)
Pt standing in doorway of her room shifting her weight between her feet, non-stop. When asked if she needs assistance, pt responds no.

## 2022-04-09 NOTE — ED Provider Notes (Addendum)
Emergency Medicine Observation Re-evaluation Note  Barbara Gordon is a 31 y.o. female, seen on rounds today.  Pt initially presented to the ED for complaints of Psychiatric Evaluation Currently, the patient is awake with mild shaking of the hands.  Physical Exam  BP 124/84   Pulse (!) 113   Temp 98.2 F (36.8 C) (Oral)   Resp 18   SpO2 98%  Physical Exam General: nad Cardiac: tachycardia Lungs: clear Psych: guarded  ED Course / MDM  EKG:EKG Interpretation  Date/Time:  Thursday April 08 2022 07:01:42 EDT Ventricular Rate:  128 PR Interval:  129 QRS Duration: 80 QT Interval:  302 QTC Calculation: 441 R Axis:   95 Text Interpretation: Sinus tachycardia Borderline right axis deviation Baseline wander in lead(s) II III aVF V4 V5 V6 increased rate from prior 4/23 Confirmed by Aletta Edouard [phone]) on 04/08/2022 7:03:35 AM  I have reviewed the labs performed to date as well as medications administered while in observation.  Recent changes in the last 24 hours include they reported overnight she had episodes of a hypoxic event with tremoring of her hand and anxiety.  However with further investigation it seems like she was not getting a good pulse ox read.  When the pulse ox was placed on her forehead sats are 100%.  Oxygen was removed and patient sats have been 98 to 100% for over an hour now.  She does not appear to be in any respiratory distress and has clear breath sounds.  Feel that patient is cleared for psychiatry..  Plan  Current plan is for psychiatric evaluation.  Pt made involuntary due to trying to leave and not making rational decisions and concern for safety.  IVC paperwork completed.  Psych following.  TSH is low today but free T4 is normal.  T3 is pending.  At this time pt will need repeat testing in 6-8 weeks but at this time has been slowly trending but does not appear to need synthroid at this time.  Barbara Gordon is not under involuntary commitment.     [name], MD 04/09/22 1058    [name], MD 04/09/22 1542

## 2022-04-09 NOTE — BH Assessment (Signed)
[place] Assessment Progress Note   At the request of Oneida Alar, NP this writer called [facility] APS to reports suspected self-neglect of an adult.  At 16:07 I called them at [phone].  Call rolled to voice mail and I left a message.  I have reached out to [facility], second shift disposition social worker, to ask that she respond to APS return call.  Brooke and EDP Lacretia Leigh, MD have been notified.  Jalene Mullet, [place] Coordinator [phone]

## 2022-04-09 NOTE — ED Provider Notes (Addendum)
CTSP due to arm stiffness and worsening tachycadia --on exam, pt able with stiff arms but able to flex them on command She afebrile and on mulptiple psych meds Considered eps vs nms Will check bmet and ck Treat with iv fluids, ativan, and cognentin and monitor  5:24 PM Spoke with patient and she now admits that her boyfriend who came to visit her today and did give her a pill to take.  She started having symptoms of muscle stiffness and increased tachycardia after this.  Patient's electrolytes here are without significant abnormality.  CK is only slightly elevated at 420.  Do not think that patient has NMS or serotonin syndrome.  Feel less likely this is an effect of the medication and she took.  She is doing much better after being treated with Ativan and Cogentin.  We will continue to monitor  7:45 PM Patient became more combative and required more sedation with Klonopin and Ativan.  She did rest initially however became more agitated.  Patient is tachycardic at this time.  She appears to have some kind of withdrawal.  I did do a repeat drug screen which was positive for benzos and THC.  Patient will require admission for observation.  Will consult hospitalist service   Lorre Nick, MD 04/09/22 1616    Lorre Nick, MD 04/09/22 1724    Lorre Nick, MD 04/09/22 1945

## 2022-04-09 NOTE — ED Notes (Signed)
Pt friend, [name], departed. Multiple staff members encouraging pt to stay in her room.

## 2022-04-09 NOTE — Progress Notes (Signed)
Went to do nursing admission history. Pt is unable to answer any of the history questions at this time. Briscoe Burns BSN, RN-BC Admissions Nurse / Discharge Review Nurse 04/09/2022 8:21 PM

## 2022-04-09 NOTE — ED Notes (Signed)
Pt's pulse ox changed to ear. Poor readings from finger location due to being tremulous. Pt now O2 100% on 2 lpm via [place]

## 2022-04-09 NOTE — ED Notes (Signed)
Per EDP, IVC complete and Forensic psychologist.

## 2022-04-09 NOTE — ED Notes (Signed)
Pt ambulated to bathroom unassisted, without oxygen. Pt denies SOB. After returning to room pt noted to be 96% oxygen saturation on room air.

## 2022-04-09 NOTE — Consult Note (Addendum)
Telepsych Consultation   Reason for Consult:  psych consult Referring Physician:  Hayden Rasmussen, MD Location of [facility]:  [name] Location of Provider: [facility] Department  Patient Identification: Barbara Gordon MRN:  [id number] Principal Diagnosis: Schizophrenia spectrum disorder with psychotic disorder type not yet determined [place]) Diagnosis:  Principal Problem:   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[place])   Total Time spent with patient: 20 minutes  Subjective:   Barbara Gordon is a 31 y.o. female patient admitted with psychosis.  Patient presents sitting on bed with her friend Barbara Gordon, constantly combing through her hair with her fingers. "I'm not sure". Flat affect. Not engaged. Moderate tremor noted. Restlessness. Single responses when asked questions; minimal eye contact. Patient's friend Barbara Gordon expressed concern for safety as patient. Unable to fully participate in assessment; appears to be actively psychotic rule out EPS, akathisia.   Collateral: Barbara Gordon (friend) Says she's been living with him since Christmas; met downtown. Patient was in hospital in [place] weeks ago; has not received any medication since release. Says patient has not slept in a few days. Complaining of headaches; has witnessed seizures. States dad is incapable of caring for patient and mother has passed away; feels she needs guardian as he is incapable. Requires 24 hour care. Has child (son) that lives with his paternal grandmother.  Of note, "friend" became defensive during assessment asking provider "why so many questions".   HPI:  Barbara Gordon is a 31 year old female with past psychiatric history of closed head injury, polysubstance abuse, substance induced disorder, schizophrenia spectrum disorder with psychotic disorder type not yet determined who initially presented to [facility] voluntarily as a walk-in (04/06/22) with a friend for assessment; patient  was transferred to [facility] for medical clearance where she left AMA. Patient then returned to [facility] 04/08/22 0650 with friend stating patient "needs help".   Past Psychiatric History: TBI, polysubstance abuse, substance induced disorder, schizophrenia spectrum disorder with psychotic disorder type not yet determined  Risk to Self:  yes Risk to Others:   Prior Inpatient Therapy:  yes Prior Outpatient Therapy:    Past Medical History: History reviewed. No pertinent past medical history. History reviewed. No pertinent surgical history. Family History: History reviewed. No pertinent family history. Family Psychiatric  History: not noted Social History:  Social History   Substance and Sexual Activity  Alcohol Use Not Currently     Social History   Substance and Sexual Activity  Drug Use Never    Social History   Socioeconomic History   Marital status: Single    Spouse name: Not on file   Number of children: Not on file   Years of education: Not on file   Highest education level: Not on file  Occupational History   Not on file  Tobacco Use   Smoking status: Some Days    Types: Cigarettes   Smokeless tobacco: Never  Substance and Sexual Activity   Alcohol use: Not Currently   Drug use: Never   Sexual activity: Never  Other Topics Concern   Not on file  Social History Narrative   Not on file   Social Determinants of Health   Financial Resource Strain: Not on file  Food Insecurity: Not on file  Transportation Needs: Not on file  Physical Activity: Not on file  Stress: Not on file  Social Connections: Not on file   Additional Social History:    Allergies:   Allergies  Allergen Reactions   Latex  Itching and Rash   Tramadol Itching and Other (See Comments)    Provider: Marijean Bravo CFM - Allergy Description: TraMADol HCl *ANALGESICS - OPIOID* CFM - Allergy Annotation: Pruritus.     Labs:  Results for orders placed or performed during the hospital encounter of  04/08/22 (from the past 48 hour(s))  CBG monitoring, ED     Status: Abnormal   Collection Time: 04/08/22  7:15 AM  Result Value Ref Range   Glucose-Capillary 153 (H) 70 - 99 mg/dL    Comment: Glucose reference range applies only to samples taken after fasting for at least 8 hours.  Urine rapid drug screen (hosp performed)     Status: Abnormal   Collection Time: 04/08/22  7:39 AM  Result Value Ref Range   Opiates NONE DETECTED NONE DETECTED   Cocaine NONE DETECTED NONE DETECTED   Benzodiazepines NONE DETECTED NONE DETECTED   Amphetamines NONE DETECTED NONE DETECTED   Tetrahydrocannabinol POSITIVE (A) NONE DETECTED   Barbiturates NONE DETECTED NONE DETECTED    Comment: (NOTE) DRUG SCREEN FOR MEDICAL PURPOSES ONLY.  IF CONFIRMATION IS NEEDED FOR ANY PURPOSE, NOTIFY LAB WITHIN 5 DAYS.  LOWEST DETECTABLE LIMITS FOR URINE DRUG SCREEN Drug Class                     Cutoff (ng/mL) Amphetamine and metabolites    1000 Barbiturate and metabolites    200 Benzodiazepine                 295 Tricyclics and metabolites     300 Opiates and metabolites        300 Cocaine and metabolites        300 THC                            50 Performed at [facility], [place] [address] [place], [place] [address]   SARS Coronavirus 2 by RT PCR (hospital order, performed in [facility] hospital lab) *cepheid single result test* Anterior Nasal Swab     Status: None   Collection Time: 04/08/22  7:45 AM   Specimen: Anterior Nasal Swab  Result Value Ref Range   SARS Coronavirus 2 by RT PCR NEGATIVE NEGATIVE    Comment: (NOTE) SARS-CoV-2 target nucleic acids are NOT DETECTED.  The SARS-CoV-2 RNA is generally detectable in upper and lower respiratory specimens during the acute phase of infection. The lowest concentration of SARS-CoV-2 viral copies this assay can detect is 250 copies / mL. A negative result does not preclude SARS-CoV-2 infection and should not be used as the sole basis for  treatment or other patient management decisions.  A negative result may occur with improper specimen collection / handling, submission of specimen other than nasopharyngeal swab, presence of viral mutation(s) within the areas targeted by this assay, and inadequate number of viral copies (<250 copies / mL). A negative result must be combined with clinical observations, patient history, and epidemiological information.  Fact Sheet for Patients:   https://www.patel.info/  Fact Sheet for Healthcare Providers: https://hall.com/  This test is not yet approved or  cleared by the [place] FDA and has been authorized for detection and/or diagnosis of SARS-CoV-2 by FDA under an Emergency Use Authorization (EUA).  This EUA will remain in effect (meaning this test can be used) for the duration of the COVID-19 declaration under Section 564(b)(1) of the Act, 21 U.S.C. section 360bbb-3(b)(1), unless the authorization is  terminated or revoked sooner.  Performed at [facility], [place] [address] [place], [place] [address]   Comprehensive metabolic panel     Status: Abnormal   Collection Time: 04/08/22  9:48 AM  Result Value Ref Range   Sodium 138 135 - 145 mmol/L   Potassium 4.4 3.5 - 5.1 mmol/L   Chloride 107 98 - 111 mmol/L   CO2 21 (L) 22 - 32 mmol/L   Glucose, Bld 82 70 - 99 mg/dL    Comment: Glucose reference range applies only to samples taken after fasting for at least 8 hours.   BUN 13 6 - 20 mg/dL   Creatinine, Ser 0.68 0.44 - 1.00 mg/dL   Calcium 10.0 8.9 - 10.3 mg/dL   Total Protein 9.2 (H) 6.5 - 8.1 g/dL   Albumin 5.3 (H) 3.5 - 5.0 g/dL   AST 32 15 - 41 U/L   ALT 39 0 - 44 U/L   Alkaline Phosphatase 47 38 - 126 U/L   Total Bilirubin 1.0 0.3 - 1.2 mg/dL   GFR, Estimated >60 >60 mL/min    Comment: (NOTE) Calculated using the CKD-EPI Creatinine Equation (2021)    Anion gap 10 5 - 15    Comment: Performed at  [facility], [place] [address] [place], [place] [address]  Salicylate level     Status: Abnormal   Collection Time: 04/08/22  9:48 AM  Result Value Ref Range   Salicylate Lvl <7.0 (L) 7.0 - 30.0 mg/dL    Comment: Performed at [facility], [place] [address] [place], [place] [address]  Acetaminophen level     Status: Abnormal   Collection Time: 04/08/22  9:48 AM  Result Value Ref Range   Acetaminophen (Tylenol), Serum <10 (L) 10 - 30 ug/mL    Comment: (NOTE) Therapeutic concentrations vary significantly. A range of 10-30 ug/mL  may be an effective concentration for many patients. However, some  are best treated at concentrations outside of this range. Acetaminophen concentrations >150 ug/mL at 4 hours after ingestion  and >50 ug/mL at 12 hours after ingestion are often associated with  toxic reactions.  Performed at [facility], [place] [address] [place], [place] [address]   Ethanol     Status: None   Collection Time: 04/08/22  9:48 AM  Result Value Ref Range   Alcohol, Ethyl (B) <10 <10 mg/dL    Comment: (NOTE) Lowest detectable limit for serum alcohol is 10 mg/dL.  For medical purposes only. Performed at [facility], [place] [address] [place],  [address]   CBC WITH DIFFERENTIAL     Status: Abnormal   Collection Time: 04/08/22  9:48 AM  Result Value Ref Range   WBC 8.6 4.0 - 10.5 K/uL   RBC 4.99 3.87 - 5.11 MIL/uL   Hemoglobin 16.1 (H) 12.0 - 15.0 g/dL   HCT 45.9 36.0 - 46.0 %   MCV 92.0 80.0 - 100.0 fL   MCH 32.3 26.0 - 34.0 pg   MCHC 35.1 30.0 - 36.0 g/dL   RDW 12.4 11.5 - 15.5 %   Platelets 343 150 - 400 K/uL   nRBC 0.0 0.0 - 0.2 %   Neutrophils Relative % 60 %   Neutro Abs 5.2 1.7 - 7.7 K/uL   Lymphocytes Relative 28 %   Lymphs Abs 2.4 0.7 - 4.0 K/uL   Monocytes Relative 6 %   Monocytes Absolute 0.5 0.1 - 1.0 K/uL   Eosinophils Relative  4 %   Eosinophils Absolute 0.3 0.0 - 0.5 K/uL    Basophils Relative 1 %   Basophils Absolute 0.1 0.0 - 0.1 K/uL   Immature Granulocytes 1 %   Abs Immature Granulocytes 0.04 0.00 - 0.07 K/uL    Comment: Performed at [facility], [place] [address] [place], [place] [address]  I-Stat beta hCG blood, ED     Status: None   Collection Time: 04/08/22  9:52 AM  Result Value Ref Range   I-stat hCG, quantitative <5.0 <5 mIU/mL   Comment 3            Comment:   GEST. AGE      CONC.  (mIU/mL)   <=1 WEEK        5 - 50     2 WEEKS       50 - 500     3 WEEKS       100 - 10,000     4 WEEKS     1,000 - 30,000        FEMALE AND NON-PREGNANT FEMALE:     LESS THAN 5 mIU/mL   TSH     Status: Abnormal   Collection Time: 04/09/22 10:04 AM  Result Value Ref Range   TSH 0.074 (L) 0.350 - 4.500 uIU/mL    Comment: Performed by a 3rd Generation assay with a functional sensitivity of <=0.01 uIU/mL. Performed at [facility], [place] [address] [place], [place] [address]   T4, free     Status: None   Collection Time: 04/09/22 10:04 AM  Result Value Ref Range   Free T4 1.00 0.61 - 1.12 ng/dL    Comment: (NOTE) Biotin ingestion may interfere with free T4 tests. If the results are inconsistent with the TSH level, previous test results, or the clinical presentation, then consider biotin interference. If needed, order repeat testing after stopping biotin. Performed at [facility], [place] [address], [place], [place] [address]     Medications:  Current Facility-Administered Medications  Medication Dose Route Frequency Provider Last Rate Last Admin   acetaminophen (TYLENOL) tablet 650 mg  650 mg Oral Q4H PRN Hayden Rasmussen, MD       alum & mag hydroxide-simeth (MAALOX/MYLANTA) 200-200-20 MG/5ML suspension 30 mL  30 mL Oral Q6H PRN Hayden Rasmussen, MD       benztropine (COGENTIN) tablet 0.5 mg  0.5 mg Oral BID Leevy-Johnson, Lita Flynn A, NP       benztropine mesylate (COGENTIN) injection 1 mg  1 mg Intravenous Once  Lacretia Leigh, MD       diphenhydrAMINE (BENADRYL) capsule 50 mg  50 mg Oral Q6H PRN Leevy-Johnson, Remmy Riffe A, NP       Or   diphenhydrAMINE (BENADRYL) injection 50 mg  50 mg Intramuscular Q6H PRN Leevy-Johnson, Takyla Kuchera A, NP       haloperidol (HALDOL) tablet 10 mg  10 mg Oral QHS Hayden Rasmussen, MD   10 mg at 04/08/22 2227   lactated ringers bolus 2,000 mL  2,000 mL Intravenous Once Lacretia Leigh, MD       lactated ringers infusion   Intravenous Continuous Lacretia Leigh, MD       lamoTRIgine (LAMICTAL) tablet 50 mg  50 mg Oral Daily Hayden Rasmussen, MD   50 mg at 04/09/22 0920   liothyronine (CYTOMEL) tablet 25 mcg  25 mcg Oral Daily Hayden Rasmussen, MD   25 mcg at 04/09/22 [phone]  LORazepam (ATIVAN) injection 1 mg  1 mg Intravenous Once Lacretia Leigh, MD       LORazepam (ATIVAN) tablet 1 mg  1 mg Oral S9P PRN Delora Fuel, MD   1 mg at 04/09/22 1540   nicotine polacrilex (NICORETTE) gum 2 mg  2 mg Oral PRN Hayden Rasmussen, MD   2 mg at 04/09/22 1541   ondansetron (ZOFRAN) tablet 4 mg  4 mg Oral Q8H PRN Hayden Rasmussen, MD       propranolol (INDERAL) tablet 10 mg  10 mg Oral BID Leevy-Johnson, Adanna Zuckerman A, NP       thiamine tablet 100 mg  100 mg Oral Daily Hayden Rasmussen, MD   100 mg at 04/09/22 0920   Current Outpatient Medications  Medication Sig Dispense Refill   FLUoxetine (PROZAC) 10 MG capsule Take 1 capsule (10 mg total) by mouth daily. 30 capsule 0   haloperidol (HALDOL) 10 MG tablet Take 1 tablet (10 mg total) by mouth at bedtime. 30 tablet 0   lamoTRIgine (LAMICTAL) 25 MG tablet Take 2 tablets (50 mg total) by mouth daily for 14 days. 28 tablet 0   liothyronine (CYTOMEL) 25 MCG tablet Take 1 tablet (25 mcg total) by mouth daily. 30 tablet 0   benztropine (COGENTIN) 0.5 MG tablet Take 1 tablet (0.5 mg total) by mouth 2 (two) times daily as needed for up to 30 doses for tremors (EPS). (Patient not taking: Reported on 04/08/2022) 30 tablet 0   lamoTRIgine (LAMICTAL) 25 MG  tablet Take 1 tablet (25 mg total) by mouth daily for 4 days. (Patient not taking: Reported on 03/02/2022) 4 tablet 0   nicotine polacrilex (NICORETTE) 2 MG gum Take 1 each (2 mg total) by mouth as needed for smoking cessation. (Patient not taking: Reported on 04/08/2022) 50 tablet 0   thiamine 100 MG tablet Take 1 tablet (100 mg total) by mouth daily. (Patient not taking: Reported on 04/08/2022)      Musculoskeletal: Strength & Muscle Tone: within normal limits Gait & Station: normal Patient leans: N/A  Psychiatric Specialty Exam:  Presentation  General Appearance: Bizarre; Disheveled  Eye Contact:Fleeting  Speech:Slow  Speech Volume:Decreased  Handedness:Right   Mood and Affect  Mood:Anxious; Dysphoric  Affect:Inappropriate   Thought Process  Thought Processes:Disorganized  Descriptions of Associations:Loose  Orientation:Partial  Thought Content:Illogical; Scattered  History of Schizophrenia/Schizoaffective disorder:Yes  Duration of Psychotic Symptoms:Greater than six months  Hallucinations:Hallucinations: None  Ideas of Reference:Paranoia  Suicidal Thoughts:Suicidal Thoughts: No (unable to fully assess due to current state)  Homicidal Thoughts:Homicidal Thoughts: No (unable to fully assess due to current state)   Sensorium  Memory:Recent Poor; Remote Poor; Immediate Poor  Judgment:Impaired  Insight:Poor; Lacking   Executive Functions  Concentration:Poor  Attention Span:Poor  Recall:Poor  Fund of Knowledge:Poor  Language:Poor   Psychomotor Activity  Psychomotor Activity:Psychomotor Activity: Extrapyramidal Side Effects (EPS); Restlessness Extrapyramidal Side Effects (EPS): Akathisia AIMS Completed?: Other (comment) (unable to fully assess due to current state; teleassessment)   Assets  Assets:Resilience; Physical Health   Sleep  Sleep:Sleep: Poor   Physical Exam: Physical Exam Vitals and nursing note reviewed.  Constitutional:       Appearance: She is normal weight.  HENT:     Head: Normocephalic.  Eyes:     Pupils: Pupils are equal, round, and reactive to light.  Cardiovascular:     Rate and Rhythm: Tachycardia present.  Pulmonary:     Effort: Pulmonary effort is normal.  Abdominal:  General: Abdomen is flat.  Musculoskeletal:     Cervical back: Normal range of motion.  Neurological:     Mental Status: She is disoriented.     Motor: Tremor present.  Psychiatric:        Attention and Perception: She is inattentive.        Mood and Affect: Affect is flat and inappropriate.        Speech: Speech is delayed.        Thought Content: Thought content is paranoid and delusional.        Cognition and Memory: Cognition is impaired. Memory is impaired.        Judgment: Judgment is impulsive and inappropriate.   Review of Systems  Neurological:  Positive for tremors and sensory change.  Psychiatric/Behavioral:  The patient has insomnia.   Blood pressure 137/89, pulse (!) 119, temperature 98.2 F (36.8 C), temperature source Oral, resp. rate 18, SpO2 97 %. There is no height or weight on file to calculate BMI.  Treatment Plan Summary: Daily contact with patient to assess and evaluate symptoms and progress in treatment, Medication management, and Plan : adjust medications: Discontinue Prozac 10 mg daily due to possible over-stimulation, begin Benztropine 1 mg BID changed to 0.5 mg BID for possible EPS, Propanolol 10 mg BID. Patient recommended for APS report,  possible guardianship resources, and inpatient psychiatric hospitalization.    Disposition: Recommend psychiatric Inpatient admission when medically cleared. Supportive therapy provided about ongoing stressors. Discussed crisis plan, support from social network, calling 911, coming to the Emergency Department, and calling Suicide Hotline.  This service was provided via telemedicine using a 2-way, interactive audio and video technology.  Names of all persons  participating in this telemedicine service and their role in this encounter. Name: Oneida Alar Role: PMHNP  Name: Hampton Abbot Role: Attending MD  Name: Barbara Gordon Role: patient  Name: Barbara Gordon Role: friend    Inda Merlin, NP 04/09/2022 4:25 PM

## 2022-04-09 NOTE — ED Notes (Addendum)
Woke up pt. Encouraged pt to provide urine specimen. Pt declined. Pt began repeatedly yelling, "Ow." Assisted pt to bedside toilet. Pt provided urine specimen. Throughout this interaction pt yelled "Ow." Asked pt where she felt pain but she only responded "Ow." Pt face was red. Pt attempted to push me away from her. Pt was reluctantly cooperative and required repetitive encouragement to provide specimen.

## 2022-04-09 NOTE — ED Provider Notes (Signed)
Patient having episodes of tachycardia with heart rate getting up as high as 120.  Also having some oxygen desaturation at the same time.  On exam, she is somewhat anxious and tremulous.  On review of prior ED visits, she has had similar episodes before.  She does have a history of substance abuse and I suspect she has some degree of withdrawal.  We will treat with as needed lorazepam.   Dione Booze, MD 04/09/22 [phone]

## 2022-04-09 NOTE — Progress Notes (Signed)
Inpatient Behavioral Health Placement  Pt meets inpatient criteria per Nira Conn, NP. There are no available beds at [facility] per [facility] Fransico Michael, RN. Referral was sent to the following facilities;   Destination Service Provider Address Phone Fax  [facility]  [address] [place] [address] [phone]  [facility]  [address] [place] [address] [phone]  [facility]  [address] [place], [place] [address] [phone]  [facility]  [address] [place] [address] [phone]  [facility]  [address] [place], [place] [address] [phone]  [facility]  [address] [place] [address] [phone]  [facility]  [address] [place] [address] [phone]  [facility]  [address] [place] [address] [phone]  [facility]  [address] [place] [address] [phone]  [facility]  [address] [place] [address] [phone]  [facility]  [address], [place] [address] [phone]  [facility]  [address], [place] [address] [phone]  [facility]  [address] [place] [address] [phone]  [facility]  [address] [place], [place] [address] [phone]  [facility]  [address], [place] [address] [phone]  [facility]  [address] [place] [address] [phone]    Situation ongoing,  CSW will follow up.   Maryjean Ka, MSW, LCSWA 04/09/2022  @ 12:55 AM

## 2022-04-09 NOTE — ED Notes (Signed)
Pt was able to ambulate to the restroom w/o assistance. No complaints at this time.

## 2022-04-09 NOTE — ED Notes (Addendum)
Received a call that a visitor in the lobby wanted to see the pt. Asked pt if she wanted a visitor, and she said yes. Dianna Rossetti [phone] came to back to visit pt. He stated that he did not need to see the pt, and he wanted information regarding pt medical care. Explained pt medical care can only be discussed with pt consent. Asked pt if she wanted me to discuss her medical care with [name]. Pt said, "I'm good." [name] said, "She always says that. She usually springs back after this process. She's been through basically everything." I wasn't sure if pt understood my question, so I told [name] he could briefly come to pt room, and I would ask her again. [name] placed his hip bag on the counter at the nurses' station and went to pt room. Again, I asked pt if she wanted me to discuss her medical care with [facility]. Pt said, "I'm good." Explained to [place] that I could not discuss her care with him because I did not understand if pt consented. [name] exited the room, picked up his bag, and started to leave. Pt followed him and would not return to room. Pt stood outside the room. Each time [name] attempted to leave, pt followed him. Three staff members attempted to redirect pt to room. Pt responded, "I'm good." Messaged provider pt attempting to leave and pt voluntary.

## 2022-04-09 NOTE — ED Notes (Signed)
Placed TTS in pt room. Pt would not return to room until [facility] walked in the room.

## 2022-04-10 ENCOUNTER — Observation Stay (HOSPITAL_COMMUNITY): Payer: Self-pay

## 2022-04-10 ENCOUNTER — Encounter (HOSPITAL_COMMUNITY): Payer: Self-pay | Admitting: Internal Medicine

## 2022-04-10 DIAGNOSIS — N19 Unspecified kidney failure: Secondary | ICD-10-CM | POA: Diagnosis present

## 2022-04-10 DIAGNOSIS — E039 Hypothyroidism, unspecified: Secondary | ICD-10-CM | POA: Diagnosis present

## 2022-04-10 LAB — URINALYSIS, COMPLETE (UACMP) WITH MICROSCOPIC
Bilirubin Urine: NEGATIVE
Glucose, UA: NEGATIVE mg/dL
Hgb urine dipstick: NEGATIVE
Ketones, ur: 5 mg/dL — AB
Leukocytes,Ua: NEGATIVE
Nitrite: NEGATIVE
Protein, ur: NEGATIVE mg/dL
Specific Gravity, Urine: 1.008 (ref 1.005–1.030)
pH: 6 (ref 5.0–8.0)

## 2022-04-10 LAB — CK: Total CK: 274 U/L — ABNORMAL HIGH (ref 38–234)

## 2022-04-10 LAB — T3, FREE: T3, Free: 5.4 pg/mL — ABNORMAL HIGH (ref 2.0–4.4)

## 2022-04-10 LAB — COMPREHENSIVE METABOLIC PANEL
ALT: 31 U/L (ref 0–44)
AST: 35 U/L (ref 15–41)
Albumin: 4 g/dL (ref 3.5–5.0)
Alkaline Phosphatase: 32 U/L — ABNORMAL LOW (ref 38–126)
Anion gap: 8 (ref 5–15)
BUN: 9 mg/dL (ref 6–20)
CO2: 21 mmol/L — ABNORMAL LOW (ref 22–32)
Calcium: 9 mg/dL (ref 8.9–10.3)
Chloride: 108 mmol/L (ref 98–111)
Creatinine, Ser: 0.58 mg/dL (ref 0.44–1.00)
GFR, Estimated: >60 mL/min (ref >60)
Glucose, Bld: 108 mg/dL — ABNORMAL HIGH (ref 70–99)
Potassium: 4 mmol/L (ref 3.5–5.1)
Sodium: 137 mmol/L (ref 135–145)
Total Bilirubin: 1 mg/dL (ref 0.3–1.2)
Total Protein: 6.5 g/dL (ref 6.5–8.1)

## 2022-04-10 LAB — PROTIME-INR
INR: 1.1 (ref 0.8–1.2)
Prothrombin Time: 14 seconds (ref 11.4–15.2)

## 2022-04-10 LAB — MAGNESIUM: Magnesium: 1.9 mg/dL (ref 1.7–2.4)

## 2022-04-10 LAB — ETHANOL: Alcohol, Ethyl (B): <10 mg/dL (ref <10)

## 2022-04-10 LAB — PHOSPHORUS: Phosphorus: 2.8 mg/dL (ref 2.5–4.6)

## 2022-04-10 MED ORDER — DOCUSATE SODIUM 100 MG PO CAPS
100.0000 mg | ORAL_CAPSULE | Freq: Two times a day (BID) | ORAL | Status: DC
  Administered 2022-04-10 – 2022-04-17 (×16): 100 mg via ORAL
  Filled 2022-04-10 (×17): qty 1

## 2022-04-10 MED ORDER — ASENAPINE MALEATE 5 MG SL SUBL
5.0000 mg | SUBLINGUAL_TABLET | Freq: Two times a day (BID) | SUBLINGUAL | Status: DC | PRN
Start: 2022-04-10 — End: 2022-04-16
  Administered 2022-04-11 – 2022-04-16 (×6): 5 mg via SUBLINGUAL
  Filled 2022-04-10 (×3): qty 1
  Filled 2022-04-10: qty 2
  Filled 2022-04-10: qty 1
  Filled 2022-04-10: qty 2
  Filled 2022-04-10 (×2): qty 1
  Filled 2022-04-10: qty 2
  Filled 2022-04-10: qty 1
  Filled 2022-04-10 (×4): qty 2

## 2022-04-10 MED ORDER — LORAZEPAM 1 MG PO TABS
1.0000 mg | ORAL_TABLET | ORAL | Status: DC | PRN
  Administered 2022-04-10 – 2022-04-14 (×38): 1 mg via ORAL
  Filled 2022-04-10 (×39): qty 1

## 2022-04-10 MED ORDER — LACTATED RINGERS IV SOLN
INTRAVENOUS | Status: DC
Start: 2022-04-10 — End: 2022-04-11

## 2022-04-10 MED ORDER — SODIUM CHLORIDE 0.9 % IV SOLN: INTRAVENOUS | Status: DC

## 2022-04-10 MED ORDER — HALOPERIDOL LACTATE 5 MG/ML IJ SOLN
4.0000 mg | Freq: Once | INTRAMUSCULAR | Status: AC
  Administered 2022-04-10: 4 mg via INTRAVENOUS
  Filled 2022-04-10 (×2): qty 1

## 2022-04-10 MED ORDER — POLYETHYLENE GLYCOL 3350 17 G PO PACK: 17.0000 g | PACK | Freq: Every day | ORAL | Status: DC | PRN

## 2022-04-10 NOTE — Assessment & Plan Note (Signed)
 #)   Mildly elevated CPK level: Noted to be 420, with associated elevation of millibar with rhabdomyolysis.  We will continue to trend, with repeat CPK level ordered for the morning, providing interval IV fluids.  We will also check urinalysis to evaluate for the presence of evidence of myoglobinuria.  Renal function appears to be at baseline at this time.  Increased risk for development of rhabdomyolysis in this patient with documented history of polysubstance abuse, including that of the stimulant class.  Plan: Repeat CPK level in the morning.  Continuous IV fluids, as above.  Repeat CMP in the morning.

## 2022-04-10 NOTE — Assessment & Plan Note (Signed)
  #)   Schizophrenia: Documented history of such; described above, she is on psych hold, being managed by behavioral health.  Current central acting medications as managed by behavioral health include Haldol nightly, Lamictal, associated with interval increase in dose per behavioral health, as above, scheduled Cogentin. Additionally, outpatient Prozac is being held per behavioral health, as above.  Plan: I have placed consult with psychiatry for ongoing assistance with her psychiatric medications, as further detailed above.

## 2022-04-10 NOTE — Assessment & Plan Note (Signed)
  #)   Acquired hypothyroidism: Documented history of such, on Cytomel as an outpatient.  Plan: In the setting of her presenting acute encephalopathy, will check TSH and free T4.  Continue home dose of Cytomel for now.

## 2022-04-10 NOTE — Assessment & Plan Note (Signed)
 #)   Acute prerenal azotemia: Updated BMP reflects acute prerenal azotemia associated BUN to creatinine ratio of greater than 25, without evidence of associated acute kidney injury, and without evidence of hypotension.  Clinically, there appears to be an element of mild dehydration.  Plan: Check urinalysis with microscopy.  Add on random urine sodium as well as random urine creatinine.  Continuous lactated Ringer's at 125 cc/h.  Monitor strict I's and O's and O's.  Repeat CMP in the morning.  Repeat CPK level in the morning as well.

## 2022-04-10 NOTE — Progress Notes (Signed)
PROGRESS NOTE    Barbara Gordon  ZOX:[id number] DOB: [birth year] DOA: 04/08/2022 PCP: Pcp, No   Brief Narrative:  The patient is a 31 year old Caucasian female with past medical history significant for but not limited to schizophrenia, polysubstance abuse, acquired hypothyroidism as well as other comorbidities who presented with altered mental status and acute encephalopathy after having worsening confusion in the [facility] emergency department while being on a psych hold via behavioral health.  Most of the history is provided by the EDP and chart reviewed.  Patient was reported on a psych hold LSO via behavioral health and starting 04/06/2022 had some subcu superimposed on schizophrenia.  Current time psychiatry would modify her outpatient meds including holding her home Prozac and increasing her outpatient Lamictal and additionally outpatient Haldol which was scheduled on a nightly basis.  Earlier on 04/09/2022 she received a visit from her boyfriend and became more confused, agitated prompting behavioral health to request medical admission to the hospital service for further evaluation of her acute encephalopathy.  Reportedly her boyfriend bought her a "drug from the street" but did not elaborate as to what the specific drug class of medication was and patient was without any specific complaints but became extremely agitated and denies any discomfort.  Imaging in the ED showed EKG with sinus tachycardia and labs were done and showed that she is positive for cannabis as well as benzodiazepines.  Assessment and Plan:  Acute Toxic Encephalopathy in the setting of unknown drug -She had worsening confusion and agitation earlier while in psych hold in the ED after she took unspecified street drug from her boyfriend in the ED -Patient has a history of polysubstance abuse and includes cannabis, amphetamines, meant methamphetamines and cocaine -UDS was positive for benzodiazepines and THC -Normal elliptic  malignant syndrome appears less likely given her low-dose of daily Haldol -Serotonin syndrome appears less likely clinically including her history of holding her Prozac by psych during a psych hold -Her differential includes withdrawal given her polysubstance abuse and she is no underlying metabolic contributions at this time with no overt evidence of infectious processes noted -Urinalysis was relatively unremarkable and showed rare bacteria but negative nitrites, negative leukocytes -Etyhl alcohol is less than 10 -Chest x-ray showed no active disease -Continue with LR at 125 MLS per hour -She did have a mildly elevated CK level but this is trending down with fluid hydration and is now 274  Metabolic Acidosis -Mild with a CO2 of 21, anion gap of 8, chloride level 108 -C/w NS at 75 mL/hr -Monitor and trend and repeat CMP in a.m.  Mildly Elevated CK level -CK was noted to be 420 and repeat is trending down -Continue IV fluid hydration as above and urinalysis was obtained to check for myoglobinuria and which was negative -She does have an increased risk for rhabdomyolysis given her documented history of polysubstance abuse and history of taking stimulants  Acquired Hypothyroidism -TSH was 0.074 and T4 was 1.00 -C/w Liothyronine 25 mcg po Daily   Schizophrenia -She was on a cycle being managed by behavioral health -Psych to assess and they were unable to do a full psychiatric evaluation given her being combative and unable to give relevant information to her mental health -They are recommending a one-to-one sitter -Psychiatry is also recommending lorazepam 1 mg every hour as needed for agitation as well as continue Haldol 10 mg p.o. nightly for agitation -Psych recommends continuing benztropine 0.5 mg p.o. twice daily for EPS prevention as well as adding Saphris 5  mg sublingually twice daily for as needed agitation -Continue with lamotrigine 50 mg p.o. daily as well as propanolol 10 mg p.o.  twice daily -Her SSRI with Prozac is being held  DVT prophylaxis: enoxaparin (LOVENOX) injection 40 mg Start: 04/10/22 0900    Code Status: Full Code Family Communication: No family currently at bedside  Disposition Plan:  Level of care: Progressive Status is: Inpatient Remains inpatient appropriate because: Needs improvement and psychiatric clearance to go back to inpatient behavioral health   Consultants:  Psychiatry  Procedures:  None  Antimicrobials:  Anti-infectives (From admission, onward)    None       Subjective: Seen and examined at bedside and she is resting and denied any complaints and denies any pain.  Was screaming this morning apparently per nursing but now improved.  No other complaints or concerns at this time.  Objective: Vitals:   04/09/22 2324 04/10/22 0441 04/10/22 0500 04/10/22 1146  BP:  113/73    Pulse:  92    Resp: 17 12    Temp:  98.2 F (36.8 C)  98 F (36.7 C)  TempSrc:  Oral  Oral  SpO2:  96%    Weight:   56.1 kg     Intake/Output Summary (Last 24 hours) at 04/10/2022 1308 Last data filed at 04/10/2022 8003 Gross per 24 hour  Intake 1958.85 ml  Output 350 ml  Net 1608.85 ml   Filed Weights   04/10/22 0500  Weight: 56.1 kg   Examination: Physical Exam:  Constitutional: WN/WD Thin Caucasian female in NAD Respiratory: Diminished to auscultation bilaterally with coarse breath sounds, no wheezing, rales, rhonchi or crackles. Normal respiratory effort and patient is not tachypenic. No accessory muscle use.  Cardiovascular: RRR, no murmurs / rubs / gallops. S1 and S2 auscultated. No extremity edema.   Abdomen: Soft, non-tender, non-distended. Bowel sounds positive.  GU: Deferred. Musculoskeletal: No clubbing / cyanosis of digits/nails. No joint deformity upper and lower extremities.  Skin: No rashes, lesions, ulcers. No induration; Warm and dry.  Neurologic: CN 2-12 grossly intact with no focal deficits. Psychiatric: Appears calm  currently but was screaming earlier per nursing.  Data Reviewed: I have personally reviewed following labs and imaging studies  CBC: Recent Labs  Lab 04/08/22 0948  WBC 8.6  NEUTROABS 5.2  HGB 16.1*  HCT 45.9  MCV 92.0  PLT 343   Basic Metabolic Panel: Recent Labs  Lab 04/08/22 0948 04/09/22 1614 04/10/22 0706 04/10/22 0722  NA 138 138  --  137  K 4.4 3.9  --  4.0  CL 107 104  --  108  CO2 21* 22  --  21*  GLUCOSE 82 104*  --  108*  BUN 13 18  --  9  CREATININE 0.68 0.71  --  0.58  CALCIUM 10.0 10.0  --  9.0  MG  --  2.2  --  1.9  PHOS  --   --  2.8  --    GFR: Estimated Creatinine Clearance: 78.3 mL/min (by C-G formula based on SCr of 0.58 mg/dL). Liver Function Tests: Recent Labs  Lab 04/08/22 0948 04/10/22 0722  AST 32 35  ALT 39 31  ALKPHOS 47 32*  BILITOT 1.0 1.0  PROT 9.2* 6.5  ALBUMIN 5.3* 4.0   No results for input(s): LIPASE, AMYLASE in the last 168 hours. No results for input(s): AMMONIA in the last 168 hours. Coagulation Profile: Recent Labs  Lab 04/10/22 0722  INR 1.1   Cardiac Enzymes:  Recent Labs  Lab 04/09/22 1614 04/10/22 0722  CKTOTAL 420* 274*   BNP (last 3 results) No results for input(s): PROBNP in the last 8760 hours. HbA1C: No results for input(s): HGBA1C in the last 72 hours. CBG: Recent Labs  Lab 04/08/22 0715  GLUCAP 153*   Lipid Profile: No results for input(s): CHOL, HDL, LDLCALC, TRIG, CHOLHDL, LDLDIRECT in the last 72 hours. Thyroid Function Tests: Recent Labs    04/09/22 1004  TSH 0.074*  FREET4 1.00   Anemia Panel: No results for input(s): VITAMINB12, FOLATE, FERRITIN, TIBC, IRON, RETICCTPCT in the last 72 hours. Sepsis Labs: No results for input(s): PROCALCITON, LATICACIDVEN in the last 168 hours.  Recent Results (from the past 240 hour(s))  Resp Panel by RT-PCR (Flu A&B, Covid) Anterior Nasal Swab     Status: None   Collection Time: 04/06/22  7:51 PM   Specimen: Anterior Nasal Swab  Result Value  Ref Range Status   SARS Coronavirus 2 by RT PCR NEGATIVE NEGATIVE Final    Comment: (NOTE) SARS-CoV-2 target nucleic acids are NOT DETECTED.  The SARS-CoV-2 RNA is generally detectable in upper respiratory specimens during the acute phase of infection. The lowest concentration of SARS-CoV-2 viral copies this assay can detect is 138 copies/mL. A negative result does not preclude SARS-Cov-2 infection and should not be used as the sole basis for treatment or other patient management decisions. A negative result may occur with  improper specimen collection/handling, submission of specimen other than nasopharyngeal swab, presence of viral mutation(s) within the areas targeted by this assay, and inadequate number of viral copies(<138 copies/mL). A negative result must be combined with clinical observations, patient history, and epidemiological information. The expected result is Negative.  Fact Sheet for Patients:  BloggerCourse.comhttps://www.fda.gov/media/152166/download  Fact Sheet for Healthcare Providers:  SeriousBroker.ithttps://www.fda.gov/media/152162/download  This test is no t yet approved or cleared by the [place] FDA and  has been authorized for detection and/or diagnosis of SARS-CoV-2 by FDA under an Emergency Use Authorization (EUA). This EUA will remain  in effect (meaning this test can be used) for the duration of the COVID-19 declaration under Section 564(b)(1) of the Act, 21 U.S.C.section 360bbb-3(b)(1), unless the authorization is terminated  or revoked sooner.       Influenza A by PCR NEGATIVE NEGATIVE Final   Influenza B by PCR NEGATIVE NEGATIVE Final    Comment: (NOTE) The Xpert Xpress SARS-CoV-2/FLU/RSV plus assay is intended as an aid in the diagnosis of influenza from Nasopharyngeal swab specimens and should not be used as a sole basis for treatment. Nasal washings and aspirates are unacceptable for Xpert Xpress SARS-CoV-2/FLU/RSV testing.  Fact Sheet for  Patients: BloggerCourse.comhttps://www.fda.gov/media/152166/download  Fact Sheet for Healthcare Providers: SeriousBroker.ithttps://www.fda.gov/media/152162/download  This test is not yet approved or cleared by the [place] FDA and has been authorized for detection and/or diagnosis of SARS-CoV-2 by FDA under an Emergency Use Authorization (EUA). This EUA will remain in effect (meaning this test can be used) for the duration of the COVID-19 declaration under Section 564(b)(1) of the Act, 21 U.S.C. section 360bbb-3(b)(1), unless the authorization is terminated or revoked.  Performed at [facility], [address] [place], [place] [id number]   SARS Coronavirus 2 by RT PCR (hospital order, performed in [facility] hospital lab) *cepheid single result test* Anterior Nasal Swab     Status: None   Collection Time: 04/08/22  7:45 AM   Specimen: Anterior Nasal Swab  Result Value Ref Range Status   SARS Coronavirus 2  by RT PCR NEGATIVE NEGATIVE Final    Comment: (NOTE) SARS-CoV-2 target nucleic acids are NOT DETECTED.  The SARS-CoV-2 RNA is generally detectable in upper and lower respiratory specimens during the acute phase of infection. The lowest concentration of SARS-CoV-2 viral copies this assay can detect is 250 copies / mL. A negative result does not preclude SARS-CoV-2 infection and should not be used as the sole basis for treatment or other patient management decisions.  A negative result may occur with improper specimen collection / handling, submission of specimen other than nasopharyngeal swab, presence of viral mutation(s) within the areas targeted by this assay, and inadequate number of viral copies (<250 copies / mL). A negative result must be combined with clinical observations, patient history, and epidemiological information.  Fact Sheet for Patients:   RoadLapTop.co.za  Fact Sheet for Healthcare  Providers: http://kim-miller.com/  This test is not yet approved or  cleared by the [place] FDA and has been authorized for detection and/or diagnosis of SARS-CoV-2 by FDA under an Emergency Use Authorization (EUA).  This EUA will remain in effect (meaning this test can be used) for the duration of the COVID-19 declaration under Section 564(b)(1) of the Act, 21 U.S.C. section 360bbb-3(b)(1), unless the authorization is terminated or revoked sooner.  Performed at [facility], [address], [place], [place] [address]      Radiology Studies: [facility] Chest Port 1 View  Result Date: 04/10/2022 CLINICAL DATA:  Acute encephalopathy. EXAM: PORTABLE CHEST 1 VIEW COMPARISON:  None Available. FINDINGS: The heart size and mediastinal contours are within normal limits. Both lungs are clear. The visualized skeletal structures are unremarkable. IMPRESSION: No active disease. Electronically Signed   By: Danae Orleans M.D.   On: 04/10/2022 08:39    Scheduled Meds:  benztropine  0.5 mg Oral BID   docusate sodium  100 mg Oral BID   enoxaparin (LOVENOX) injection  40 mg Subcutaneous Q24H   haloperidol  10 mg Oral QHS   lamoTRIgine  50 mg Oral Daily   liothyronine  25 mcg Oral Daily   propranolol  10 mg Oral BID   thiamine  100 mg Oral Daily   Continuous Infusions:  lactated ringers 125 mL/hr at 04/09/22 2231   lactated ringers 125 mL/hr at 04/10/22 8099    LOS: 0 days   Marguerita Merles, DO [facility] Available via Epic secure chat 7am-7pm After these hours, please refer to coverage provider listed on [email or web address] 04/10/2022, 1:08 PM

## 2022-04-10 NOTE — Progress Notes (Signed)
Received patient- she is extremely agitated. Screaming out, hyperventilating. Lab unable to collect all bloodwork. Patient able to verbalize, "I don't know" in response to why she is screaming.  Haldol given. Safety sitter remains at bedside.

## 2022-04-10 NOTE — Assessment & Plan Note (Signed)
 #)   Acute encephalopathy: Worsening confusion and agitation earlier today while on psych hold in the emergency department, concerning for acute toxic encephalopathy given report the patient taking a street drug by her boyfriend while in the emergency department, specific nature of this drug is not entirely clear at this time.  Patient has a documented history of polysubstance abuse, including abuse of cannabis, amphetamines, methamphetamine, and cocaine.  Urinary drug screen worsening confusion notable for being positive for cannabis as well as benzodiazepines, with the interval to assist with patient's agitation.  Neuroleptic malignant syndrome appears less likely, given low-dose of daily Haldol.  Additionally, serotonin syndrome appears clinically less likely, including via history that includes holding of Prozac by psych during this psych hold.  Differential also includes withdrawal given her history of polysubstance abuse.  No overt evidence of underlying metabolic contributions at this time, including no overt evidence of underlying infectious process, noting negative COVID 19 PCR.  We will check urinalysis as well as chest x-ray to further evaluate.  Is noted, although the degree of elevation is not consistent at this time with rhabdomyolysis.  We will continue to trend this value.  We will also obtain updated liver enzymes.  Evaluation for underlying metabolic contributions, as further detailed below.    Plan: Prn IV Ativan.  Check TSH and free T4, repeat CPK level in the morning.  Check urinalysis, chest x-ray, ammonia level, INR, ionized calcium, MMA, and add on serum ethanol level.  In the context of evidence of mild dehydration and mildly elevated CPK, will start continuous IV fluids.  I have also consulted psychiatry for their ongoing assistance with management of her psychiatric medications in the setting of schizophrenia and current psych hold.  CMP and CBC in the morning.  Add on serum  magnesium level.

## 2022-04-10 NOTE — Progress Notes (Signed)
Pt. Agitation seems to have improved at this point. Will hold holdol dose for the time being.

## 2022-04-10 NOTE — H&P (Signed)
History and Physical    PLEASE NOTE THAT DRAGON DICTATION SOFTWARE WAS USED IN THE CONSTRUCTION OF THIS NOTE.   Barbara Gordon [id number] DOB: [birth year] DOA: 04/08/2022  PCP: Pcp, No (will further assess) Patient coming from: home   I have personally briefly reviewed patient's old medical records in [place]  Chief Complaint: Altered mental status  HPI: Barbara Gordon is a 31 y.o. female with medical history significant for schizophrenia, polysubstance abuse, acquired hypothyroidism, who is admitted to [facility] on 04/08/2022 with acute encephalopathy after developing worsening confusion and in [facility] emergency department while on psych hold via behavioral health.   In the setting of the patient's altered mental status, the following history is provided via my discussions with the EDP and via chart review.  Patient reportedly on psycho hold in [facility] emergency department via behavioral health starting on 04/06/2022 substance abuse superimposed on schizophrenia.  During her time in [facility], psychiatry has been modifying her outpatient psych meds, including holding of home Prozac, increase in dose of outpatient Lamictal from 25 mg p.o. daily to 50 mg p.o. daily as well as scheduling her outpatient prn Cogentin.  Additionally, outpatient Haldol, which is scheduled nightly basis has been continued.  Earlier on 04-09-22, reportedly after the patient received a visit from her boyfriend, the patient became more confused, agitated, prompting behavioral health to request admission to the hospital service for further evaluation and management of acute encephalopathy.  Patient also reportedly conveyed that the patient's boyfriend had brought her a "drug from the street" but did not elaborate as to the specific drug nor class of medication that was involved in this transaction.   The patient is without any specific complaint at this time.  She denies any acute discomfort at the  present time, including no chest pain.      ED Course:  Vital signs in the ED were notable for the following: Afebrile; heart rate initially 101 no 120, subsequent improving to 91 following will doses of Ativan, clonazepam, and Cogentin.  Blood pressure 122/83; respiratory rate 17-22, oxygen saturation 97 to 100% on room air.  Labs were notable for the following: BMP notable for the following: Sodium 138, bicarbonate 22, BUN 18, creatinine 0.71 compared to most recent prior value of 0.68 on the morning of 6-23, BUN/creatinine ratio 25.4, glucose 104.  CPK level 420.  CBC notable for white blood cell count 8600.  Serum ethanol level less than 10.  Serum salicylate level less than 7.  Urinary drug screen, which was drawn after receiving doses of IV Ativan, was positive for cannabis as well as benzodiazepines, but otherwise pan negative.  COVID-19 PCR negative.  Imaging and additional notable ED work-up: EKG shows sinus tachycardia with heart rate 117, normal intervals, no evidence of T wave or ST changes, including no evidence of ST elevation.   Subsequently, the patient was admitted to the hospitalist service for further evaluation and management of acute encephalopathy, with suspicion for toxic etiology, with labs notable for mildly elevated CPK as well as acute prerenal azotemia.   Review of Systems: As per HPI otherwise 10 point review of systems negative.   Past Medical History:  Diagnosis Date   Hypothyroidism    Polysubstance abuse ([place])    Schizophrenia ([place])     History reviewed. No pertinent surgical history.  Social History:  reports that she has been smoking cigarettes. She has never used smokeless tobacco. She reports that she does not currently use alcohol.  She reports that she does not currently use drugs after having used the following drugs: Cocaine, Amphetamines, Methamphetamines, and Marijuana.   Allergies  Allergen Reactions   Latex Itching and Rash   Tramadol  Itching and Other (See Comments)    Provider: Marijean Bravo CFM - Allergy Description: TraMADol HCl *ANALGESICS - OPIOID* CFM - Allergy Annotation: Pruritus.     History reviewed. No pertinent family history.  Family history reviewed and not pertinent    Prior to Admission medications   Medication Sig Start Date End Date Taking? Authorizing Provider  FLUoxetine (PROZAC) 10 MG capsule Take 1 capsule (10 mg total) by mouth daily. 01/31/22 04/08/22 Yes Pashayan, Redgie Grayer, MD  haloperidol (HALDOL) 10 MG tablet Take 1 tablet (10 mg total) by mouth at bedtime. 01/30/22 04/08/22 Yes Pashayan, Redgie Grayer, MD  lamoTRIgine (LAMICTAL) 25 MG tablet Take 2 tablets (50 mg total) by mouth daily for 14 days. 02/03/22 04/08/22 Yes Pashayan, Redgie Grayer, MD  liothyronine (CYTOMEL) 25 MCG tablet Take 1 tablet (25 mcg total) by mouth daily. 01/31/22 04/08/22 Yes Pashayan, Redgie Grayer, MD  benztropine (COGENTIN) 0.5 MG tablet Take 1 tablet (0.5 mg total) by mouth 2 (two) times daily as needed for up to 30 doses for tremors (EPS). Patient not taking: Reported on 04/08/2022 01/30/22   Briant Cedar, MD  lamoTRIgine (LAMICTAL) 25 MG tablet Take 1 tablet (25 mg total) by mouth daily for 4 days. Patient not taking: Reported on 03/02/2022 01/31/22   Briant Cedar, MD  nicotine polacrilex (NICORETTE) 2 MG gum Take 1 each (2 mg total) by mouth as needed for smoking cessation. Patient not taking: Reported on 04/08/2022 01/30/22   Briant Cedar, MD  thiamine 100 MG tablet Take 1 tablet (100 mg total) by mouth daily. Patient not taking: Reported on 04/08/2022 01/31/22   Briant Cedar, MD     Objective    Physical Exam: Vitals:   04/09/22 1900 04/09/22 2143 04/09/22 2324 04/10/22 0441  BP: (!) 147/94 (!) 141/90  113/73  Pulse:  (!) 116  92  Resp:  20 17 12   Temp:  98.2 F (36.8 C)  98.2 F (36.8 C)  TempSrc:  Oral  Oral  SpO2: 97% 98%  96%    General: appears to be stated age; alert,  confused Skin: warm, dry, no rash Head:  AT/[place] Mouth:  Oral mucosa membranes appear dry, normal dentition Neck: supple; trachea midline Heart:  RRR; did not appreciate any M/R/G Lungs: CTAB, did not appreciate any wheezes, rales, or rhonchi Abdomen: + BS; soft, ND, NT Vascular: 2+ pedal pulses b/l; 2+ radial pulses b/l Extremities: no peripheral edema, no muscle wasting Neuro: strength and sensation intact in upper and lower extremities b/l      Labs on Admission: I have personally reviewed following labs and imaging studies  CBC: Recent Labs  Lab 04/08/22 0948  WBC 8.6  NEUTROABS 5.2  HGB 16.1*  HCT 45.9  MCV 92.0  PLT [id number]   Basic Metabolic Panel: Recent Labs  Lab 04/08/22 0948 04/09/22 1614  NA 138 138  K 4.4 3.9  CL 107 104  CO2 21* 22  GLUCOSE 82 104*  BUN 13 18  CREATININE 0.68 0.71  CALCIUM 10.0 10.0  MG  --  2.2   GFR: CrCl cannot be calculated (Unknown ideal weight.). Liver Function Tests: Recent Labs  Lab 04/08/22 0948  AST 32  ALT 39  ALKPHOS 47  BILITOT 1.0  PROT 9.2*  ALBUMIN  5.3*   No results for input(s): LIPASE, AMYLASE in the last 168 hours. No results for input(s): AMMONIA in the last 168 hours. Coagulation Profile: No results for input(s): INR, PROTIME in the last 168 hours. Cardiac Enzymes: Recent Labs  Lab 04/09/22 1614  CKTOTAL 420*   BNP (last 3 results) No results for input(s): PROBNP in the last 8760 hours. HbA1C: No results for input(s): HGBA1C in the last 72 hours. CBG: Recent Labs  Lab 04/08/22 0715  GLUCAP 153*   Lipid Profile: No results for input(s): CHOL, HDL, LDLCALC, TRIG, CHOLHDL, LDLDIRECT in the last 72 hours. Thyroid Function Tests: Recent Labs    04/09/22 1004  TSH 0.074*  FREET4 1.00   Anemia Panel: No results for input(s): VITAMINB12, FOLATE, FERRITIN, TIBC, IRON, RETICCTPCT in the last 72 hours. Urine analysis:    Component Value Date/Time   COLORURINE YELLOW 03/01/2022 1952    APPEARANCEUR CLEAR 03/01/2022 1952   LABSPEC 1.013 03/01/2022 1952   PHURINE 6.0 03/01/2022 1952   GLUCOSEU NEGATIVE 03/01/2022 1952   HGBUR NEGATIVE 03/01/2022 1952   BILIRUBINUR NEGATIVE 03/01/2022 [place] NEGATIVE 03/01/2022 1952   PROTEINUR NEGATIVE 03/01/2022 1952   UROBILINOGEN 1.0 06/23/2009 1520   NITRITE NEGATIVE 03/01/2022 1952   LEUKOCYTESUR NEGATIVE 03/01/2022 1952    Radiological Exams on Admission: No results found.   EKG: Independently reviewed, with result as described above.    Assessment/Plan    Principal Problem:   Acute encephalopathy Active Problems:   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HCC)   Elevated CPK   Acute prerenal azotemia   Acquired hypothyroidism     #) Acute encephalopathy: Worsening confusion and agitation earlier today while on psych hold in the emergency department, concerning for acute toxic encephalopathy given report the patient taking a street drug by her boyfriend while in the emergency department, specific nature of this drug is not entirely clear at this time.  Patient has a documented history of polysubstance abuse, including abuse of cannabis, amphetamines, methamphetamine, and cocaine.  Urinary drug screen worsening confusion notable for being positive for cannabis as well as benzodiazepines, with the interval to assist with patient's agitation.  Neuroleptic malignant syndrome appears less likely, given low-dose of daily Haldol.  Additionally, serotonin syndrome appears clinically less likely, including via history that includes holding of Prozac by psych during this psych hold.  Differential also includes withdrawal given her history of polysubstance abuse.  No overt evidence of underlying metabolic contributions at this time, including no overt evidence of underlying infectious process, noting negative COVID 19 PCR.  We will check urinalysis as well as chest x-ray to further  evaluate.  Is noted, although the degree of elevation is not consistent at this time with rhabdomyolysis.  We will continue to trend this value.  We will also obtain updated liver enzymes.  Evaluation for underlying metabolic contributions, as further detailed below.    Plan: Prn IV Ativan.  Check TSH and free T4, repeat CPK level in the morning.  Check urinalysis, chest x-ray, ammonia level, INR, ionized calcium, MMA, and add on serum ethanol level.  In the context of evidence of mild dehydration and mildly elevated CPK, will start continuous IV fluids.  I have also consulted psychiatry for their ongoing assistance with management of her psychiatric medications in the setting of schizophrenia and current psych hold.  CMP and CBC in the morning.  Add on serum magnesium level.       #)  Acute prerenal azotemia: Updated BMP reflects acute prerenal azotemia associated BUN to creatinine ratio of greater than 25, without evidence of associated acute kidney injury, and without evidence of hypotension.  Clinically, there appears to be an element of mild dehydration.  Plan: Check urinalysis with microscopy.  Add on random urine sodium as well as random urine creatinine.  Continuous lactated Ringer's at 125 cc/h.  Monitor strict I's and O's and O's.  Repeat CMP in the morning.  Repeat CPK level in the morning as well.         #) Mildly elevated CPK level: Noted to be 420, with associated elevation of millibar with rhabdomyolysis.  We will continue to trend, with repeat CPK level ordered for the morning, providing interval IV fluids.  We will also check urinalysis to evaluate for the presence of evidence of myoglobinuria.  Renal function appears to be at baseline at this time.  Increased risk for development of rhabdomyolysis in this patient with documented history of polysubstance abuse, including that of the stimulant class.  Plan: Repeat CPK level in the morning.  Continuous IV fluids, as above.   Repeat CMP in the morning.        #) Acquired hypothyroidism: Documented history of such, on Cytomel as an outpatient.  Plan: In the setting of her presenting acute encephalopathy, will check TSH and free T4.  Continue home dose of Cytomel for now.        #) Schizophrenia: Documented history of such; described above, she is on psych hold, being managed by behavioral health.  Current central acting medications as managed by behavioral health include Haldol nightly, Lamictal, associated with interval increase in dose per behavioral health, as above, scheduled Cogentin. Additionally, outpatient Prozac is being held per behavioral health, as above.  Plan: I have placed consult with psychiatry for ongoing assistance with her psychiatric medications, as further detailed above.      DVT prophylaxis: SCD's   Code Status: Full code Family Communication: none Disposition Plan: Per Rounding Team Consults called: Psychiatry consulted, as further detailed above;  Admission status: Observation   PLEASE NOTE THAT DRAGON DICTATION SOFTWARE WAS USED IN THE CONSTRUCTION OF THIS NOTE.   [place] DO [facility] [place]   04/10/2022, 5:18 AM

## 2022-04-10 NOTE — Progress Notes (Signed)
Pt. Remains agitated following Ativan administration. Dr. Velia Meyer paged for orders.

## 2022-04-10 NOTE — Consult Note (Signed)
Attempt made to do full psychiatric evaluation unsuccessful due to patient being combative and unable to give information relevant to her mental health.  Plan/recommendations: -Continue 1:1 sitter for safety -Continue Lorazepam 1 mg qhr PRN as needed for agitation -Continue Haldol 10 mg at bedtime for agitation -Continue Benztropine 0.5 mg BID for EPS prevention -Add Saphris 5 mg S/L twice daily as needed for agitation -Will attempt full psychiatric re-evaluation of patient tomorrow-04/11/22  Thedore Mins, MD Attending psychiatrist

## 2022-04-11 LAB — CBC WITH DIFFERENTIAL/PLATELET
Abs Immature Granulocytes: 0.01 10*3/uL (ref 0.00–0.07)
Basophils Absolute: 0 10*3/uL (ref 0.0–0.1)
Basophils Relative: 1 %
Eosinophils Absolute: 0.3 10*3/uL (ref 0.0–0.5)
Eosinophils Relative: 7 %
HCT: 35.3 % — ABNORMAL LOW (ref 36.0–46.0)
Hemoglobin: 12.4 g/dL (ref 12.0–15.0)
Immature Granulocytes: 0 %
Lymphocytes Relative: 44 %
Lymphs Abs: 1.9 10*3/uL (ref 0.7–4.0)
MCH: 32.3 pg (ref 26.0–34.0)
MCHC: 35.1 g/dL (ref 30.0–36.0)
MCV: 91.9 fL (ref 80.0–100.0)
Monocytes Absolute: 0.4 10*3/uL (ref 0.1–1.0)
Monocytes Relative: 8 %
Neutro Abs: 1.7 10*3/uL (ref 1.7–7.7)
Neutrophils Relative %: 40 %
Platelets: 260 10*3/uL (ref 150–400)
RBC: 3.84 MIL/uL — ABNORMAL LOW (ref 3.87–5.11)
RDW: 11.9 % (ref 11.5–15.5)
WBC: 4.3 10*3/uL (ref 4.0–10.5)
nRBC: 0 % (ref 0.0–0.2)

## 2022-04-11 LAB — COMPREHENSIVE METABOLIC PANEL
ALT: 29 U/L (ref 0–44)
AST: 29 U/L (ref 15–41)
Albumin: 4.2 g/dL (ref 3.5–5.0)
Alkaline Phosphatase: 35 U/L — ABNORMAL LOW (ref 38–126)
Anion gap: 7 (ref 5–15)
BUN: <5 mg/dL — ABNORMAL LOW (ref 6–20)
CO2: 26 mmol/L (ref 22–32)
Calcium: 9.9 mg/dL (ref 8.9–10.3)
Chloride: 109 mmol/L (ref 98–111)
Creatinine, Ser: 0.61 mg/dL (ref 0.44–1.00)
GFR, Estimated: >60 mL/min (ref >60)
Glucose, Bld: 114 mg/dL — ABNORMAL HIGH (ref 70–99)
Potassium: 3.7 mmol/L (ref 3.5–5.1)
Sodium: 142 mmol/L (ref 135–145)
Total Bilirubin: 0.7 mg/dL (ref 0.3–1.2)
Total Protein: 7 g/dL (ref 6.5–8.1)

## 2022-04-11 LAB — PHOSPHORUS: Phosphorus: 3.3 mg/dL (ref 2.5–4.6)

## 2022-04-11 LAB — CALCIUM, IONIZED: Calcium, Ionized, Serum: 5.3 mg/dL (ref 4.5–5.6)

## 2022-04-11 LAB — CK: Total CK: 171 U/L (ref 38–234)

## 2022-04-11 LAB — AMMONIA: Ammonia: 29 umol/L (ref 9–35)

## 2022-04-11 LAB — MAGNESIUM: Magnesium: 2.1 mg/dL (ref 1.7–2.4)

## 2022-04-11 MED ORDER — LAMOTRIGINE 25 MG PO TABS
25.0000 mg | ORAL_TABLET | Freq: Every day | ORAL | Status: DC
  Administered 2022-04-11 – 2022-04-13 (×3): 25 mg via ORAL
  Filled 2022-04-11 (×3): qty 1

## 2022-04-11 MED ORDER — NICOTINE POLACRILEX 2 MG MT GUM: 2.0000 mg | CHEWING_GUM | OROMUCOSAL | Status: DC | PRN

## 2022-04-11 MED ORDER — LACTATED RINGERS IV SOLN
INTRAVENOUS | Status: AC
Start: 2022-04-11 — End: 2022-04-12

## 2022-04-11 MED ORDER — HALOPERIDOL 5 MG PO TABS
5.0000 mg | ORAL_TABLET | Freq: Every day | ORAL | Status: DC
  Administered 2022-04-12 – 2022-04-16 (×5): 5 mg via ORAL
  Filled 2022-04-11 (×5): qty 1

## 2022-04-11 NOTE — Consult Note (Signed)
[facility] Face-to-Face Psychiatry Consult   Reason for Consult: ''? patient on psych hold; h/o schizophrenia.'' Referring Physician:  Merlene Laughter, DO Patient Identification: Barbara Gordon MRN:  [id number] Principal Diagnosis: Cannabis use with psychotic disorder (HCC) Diagnosis:  Principal Problem:   Cannabis use with psychotic disorder (HCC) Active Problems:   Elevated CPK   Brief psychotic disorder (HCC)   Acute encephalopathy   Acute prerenal azotemia   Acquired hypothyroidism   Total Time spent with patient: 1 hour  Subjective:   Barbara Gordon is a 31 y.o. female patient admitted with altered mental status.  HPI:  Patient is a 31 year old Caucasian female with past medical history significant for Schizophrenia, substance abuse, substance induced psychosis, acquired hypothyroidism as well as other comorbidities who presented with altered mental status and acute encephalopathy after having worsening confusion in the [facility] emergency department while being on a psych hold via behavioral health. Patient reportedly became more confused, agitated, after her boyfriend visited her. As a result, behavioral health requested admission to the hospital service for further evaluation and management of acute encephalopathy. Patient also reported that her boyfriend had brought her a "drug from the street" but did not elaborate as to the specific drug nor class of medication that was involved. Today, patient remains psychotic, easily agitated, restless, apprehensive and claim to be suicidal. She has difficulty following directions, refusing to elaborate on the reasons for her admission other than saying;''I only smoked a small weed.'' Her urine toxicology was positive for THC and Benzodiazepine. She is unable to contract for safety.  Past Psychiatric History: as above  Risk to Self:  yes Risk to Others:  denies Prior Inpatient Therapy:   Prior Outpatient Therapy:  non-compliant  Past Medical  History:  Past Medical History:  Diagnosis Date   Hypothyroidism    Polysubstance abuse (HCC)    Schizophrenia (HCC)    History reviewed. No pertinent surgical history. Family History: History reviewed. No pertinent family history. Family Psychiatric  History:  Social History:  Social History   Substance and Sexual Activity  Alcohol Use Not Currently     Social History   Substance and Sexual Activity  Drug Use Not Currently   Types: Cocaine, Amphetamines, Methamphetamines, Marijuana    Social History   Socioeconomic History   Marital status: Single    Spouse name: Not on file   Number of children: Not on file   Years of education: Not on file   Highest education level: Not on file  Occupational History   Not on file  Tobacco Use   Smoking status: Some Days    Types: Cigarettes   Smokeless tobacco: Never  Substance and Sexual Activity   Alcohol use: Not Currently   Drug use: Not Currently    Types: Cocaine, Amphetamines, Methamphetamines, Marijuana   Sexual activity: Never  Other Topics Concern   Not on file  Social History Narrative   Not on file   Social Determinants of Health   Financial Resource Strain: Not on file  Food Insecurity: Not on file  Transportation Needs: Not on file  Physical Activity: Not on file  Stress: Not on file  Social Connections: Not on file   Additional Social History:    Allergies:   Allergies  Allergen Reactions   Latex Itching and Rash   Tramadol Itching and Other (See Comments)    Provider: Crist Fat CFM - Allergy Description: TraMADol HCl *ANALGESICS - OPIOID* CFM - Allergy Annotation: Pruritus.  Labs:  Results for orders placed or performed during the hospital encounter of 04/08/22 (from the past 48 hour(s))  CK     Status: Abnormal   Collection Time: 04/09/22  4:14 PM  Result Value Ref Range   Total CK 420 (H) 38 - 234 U/L    Comment: Performed at [facility] [place] [address],  [place], [place] [id number]  Basic metabolic panel     Status: Abnormal   Collection Time: 04/09/22  4:14 PM  Result Value Ref Range   Sodium 138 135 - 145 mmol/L   Potassium 3.9 3.5 - 5.1 mmol/L   Chloride 104 98 - 111 mmol/L   CO2 22 22 - 32 mmol/L   Glucose, Bld 104 (H) 70 - 99 mg/dL    Comment: Glucose reference range applies only to samples taken after fasting for at least 8 hours.   BUN 18 6 - 20 mg/dL   Creatinine, Ser 0.71 0.44 - 1.00 mg/dL   Calcium 10.0 8.9 - 10.3 mg/dL   GFR, Estimated >60 >60 mL/min    Comment: (NOTE) Calculated using the CKD-EPI Creatinine Equation (2021)    Anion gap 12 5 - 15    Comment: Performed at [facility], [place] [address] [place], [place] [address]  Magnesium     Status: None   Collection Time: 04/09/22  4:14 PM  Result Value Ref Range   Magnesium 2.2 1.7 - 2.4 mg/dL    Comment: Performed at [facility], [place] [address] [place], [place] [address]  Rapid urine drug screen (hospital performed)     Status: Abnormal   Collection Time: 04/09/22  6:21 PM  Result Value Ref Range   Opiates NONE DETECTED NONE DETECTED   Cocaine NONE DETECTED NONE DETECTED   Benzodiazepines POSITIVE (A) NONE DETECTED   Amphetamines NONE DETECTED NONE DETECTED   Tetrahydrocannabinol POSITIVE (A) NONE DETECTED   Barbiturates NONE DETECTED NONE DETECTED    Comment: (NOTE) DRUG SCREEN FOR MEDICAL PURPOSES ONLY.  IF CONFIRMATION IS NEEDED FOR ANY PURPOSE, NOTIFY LAB WITHIN 5 DAYS.  LOWEST DETECTABLE LIMITS FOR URINE DRUG SCREEN Drug Class                     Cutoff (ng/mL) Amphetamine and metabolites    1000 Barbiturate and metabolites    200 Benzodiazepine                 [id number] Tricyclics and metabolites     300 Opiates and metabolites        300 Cocaine and metabolites        300 THC                            50 Performed at [facility], [place] [address], [place], [place] [address]   Urinalysis, Complete w  Microscopic Urine, Clean Catch     Status: Abnormal   Collection Time: 04/10/22  6:02 AM  Result Value Ref Range   Color, Urine YELLOW YELLOW   APPearance CLEAR CLEAR   Specific Gravity, Urine 1.008 1.005 - 1.030   pH 6.0 5.0 - 8.0   Glucose, UA NEGATIVE NEGATIVE mg/dL   Hgb urine dipstick NEGATIVE NEGATIVE   Bilirubin Urine NEGATIVE NEGATIVE   Ketones, ur 5 (A) NEGATIVE mg/dL   Protein, ur NEGATIVE NEGATIVE mg/dL   Nitrite NEGATIVE NEGATIVE   Leukocytes,Ua NEGATIVE NEGATIVE   RBC /  HPF 0-5 0 - 5 RBC/hpf   WBC, UA 0-5 0 - 5 WBC/hpf   Bacteria, UA RARE (A) NONE SEEN   Squamous Epithelial / LPF 0-5 0 - 5   Mucus PRESENT     Comment: Performed at [facility], [place] [address], [place], [place] [address]  Phosphorus     Status: None   Collection Time: 04/10/22  7:06 AM  Result Value Ref Range   Phosphorus 2.8 2.5 - 4.6 mg/dL    Comment: Performed at [facility], [place] [address], [place], [place] [address]  Magnesium     Status: None   Collection Time: 04/10/22  7:22 AM  Result Value Ref Range   Magnesium 1.9 1.7 - 2.4 mg/dL    Comment: Performed at [facility], [place] [address] [place], [place] [address]  Comprehensive metabolic panel     Status: Abnormal   Collection Time: 04/10/22  7:22 AM  Result Value Ref Range   Sodium 137 135 - 145 mmol/L   Potassium 4.0 3.5 - 5.1 mmol/L   Chloride 108 98 - 111 mmol/L   CO2 21 (L) 22 - 32 mmol/L   Glucose, Bld 108 (H) 70 - 99 mg/dL    Comment: Glucose reference range applies only to samples taken after fasting for at least 8 hours.   BUN 9 6 - 20 mg/dL   Creatinine, Ser 0.58 0.44 - 1.00 mg/dL   Calcium 9.0 8.9 - 10.3 mg/dL   Total Protein 6.5 6.5 - 8.1 g/dL   Albumin 4.0 3.5 - 5.0 g/dL   AST 35 15 - 41 U/L   ALT 31 0 - 44 U/L   Alkaline Phosphatase 32 (L) 38 - 126 U/L   Total Bilirubin 1.0 0.3 - 1.2 mg/dL   GFR, Estimated >60 >60 mL/min    Comment: (NOTE) Calculated using the  CKD-EPI Creatinine Equation (2021)    Anion gap 8 5 - 15    Comment: Performed at [facility], [place] [address], [place], [place] [address]  CK     Status: Abnormal   Collection Time: 04/10/22  7:22 AM  Result Value Ref Range   Total CK 274 (H) 38 - 234 U/L    Comment: Performed at [facility], [place] [address], [place], [place] [address]  Protime-INR     Status: None   Collection Time: 04/10/22  7:22 AM  Result Value Ref Range   Prothrombin Time 14.0 11.4 - 15.2 seconds   INR 1.1 0.8 - 1.2    Comment: (NOTE) INR goal varies based on device and disease states. Performed at [facility], [place] [address], [place], [place] [address]   Ethanol     Status: None   Collection Time: 04/10/22  8:59 AM  Result Value Ref Range   Alcohol, Ethyl (B) <10 <10 mg/dL    Comment: (NOTE) Lowest detectable limit for serum alcohol is 10 mg/dL.  For medical purposes only. Performed at [facility], [place] [address], [place], [place] [address]   Ammonia     Status: None   Collection Time: 04/11/22  4:05 AM  Result Value Ref Range   Ammonia 29 9 - 35 umol/L    Comment: Performed at [facility], [place] [address] [place], [place] [address]  CBC with Differential/Platelet     Status: Abnormal   Collection Time: 04/11/22  4:05 AM  Result Value Ref Range  WBC 4.3 4.0 - 10.5 K/uL   RBC 3.84 (L) 3.87 - 5.11 MIL/uL   Hemoglobin 12.4 12.0 - 15.0 g/dL   HCT 35.3 (L) 36.0 - 46.0 %   MCV 91.9 80.0 - 100.0 fL   MCH 32.3 26.0 - 34.0 pg   MCHC 35.1 30.0 - 36.0 g/dL   RDW 11.9 11.5 - 15.5 %   Platelets 260 150 - 400 K/uL   nRBC 0.0 0.0 - 0.2 %   Neutrophils Relative % 40 %   Neutro Abs 1.7 1.7 - 7.7 K/uL   Lymphocytes Relative 44 %   Lymphs Abs 1.9 0.7 - 4.0 K/uL   Monocytes Relative 8 %   Monocytes Absolute 0.4 0.1 - 1.0 K/uL   Eosinophils Relative 7 %   Eosinophils Absolute 0.3 0.0 - 0.5 K/uL   Basophils  Relative 1 %   Basophils Absolute 0.0 0.0 - 0.1 K/uL   Immature Granulocytes 0 %   Abs Immature Granulocytes 0.01 0.00 - 0.07 K/uL    Comment: Performed at [facility], [place] [address] [place], [place] [address]  Comprehensive metabolic panel     Status: Abnormal   Collection Time: 04/11/22  4:05 AM  Result Value Ref Range   Sodium 142 135 - 145 mmol/L   Potassium 3.7 3.5 - 5.1 mmol/L   Chloride 109 98 - 111 mmol/L   CO2 26 22 - 32 mmol/L   Glucose, Bld 114 (H) 70 - 99 mg/dL    Comment: Glucose reference range applies only to samples taken after fasting for at least 8 hours.   BUN <5 (L) 6 - 20 mg/dL   Creatinine, Ser 0.61 0.44 - 1.00 mg/dL   Calcium 9.9 8.9 - 10.3 mg/dL   Total Protein 7.0 6.5 - 8.1 g/dL   Albumin 4.2 3.5 - 5.0 g/dL   AST 29 15 - 41 U/L   ALT 29 0 - 44 U/L   Alkaline Phosphatase 35 (L) 38 - 126 U/L   Total Bilirubin 0.7 0.3 - 1.2 mg/dL   GFR, Estimated >60 >60 mL/min    Comment: (NOTE) Calculated using the CKD-EPI Creatinine Equation (2021)    Anion gap 7 5 - 15    Comment: Performed at [facility], [place] [address] [place], [place] [address]  Phosphorus     Status: None   Collection Time: 04/11/22  4:05 AM  Result Value Ref Range   Phosphorus 3.3 2.5 - 4.6 mg/dL    Comment: Performed at [facility], [place] [address] [place], [place] [address]  Magnesium     Status: None   Collection Time: 04/11/22  4:05 AM  Result Value Ref Range   Magnesium 2.1 1.7 - 2.4 mg/dL    Comment: Performed at [facility], [place] [address], [place], [place] [address]  CK     Status: None   Collection Time: 04/11/22  4:05 AM  Result Value Ref Range   Total CK 171 38 - 234 U/L    Comment: Performed at [facility], [place] [address], [place], [place] [address]    Current Facility-Administered Medications  Medication Dose Route Frequency Provider Last Rate Last Admin   acetaminophen  (TYLENOL) tablet 650 mg  650 mg Oral Q6H PRN Howerter, Justin B, DO   650 mg at 04/11/22 1011   Or   acetaminophen (TYLENOL) suppository 650 mg  650 mg Rectal Q6H PRN Howerter, Justin B, DO  alum & mag hydroxide-simeth (MAALOX/MYLANTA) 200-200-20 MG/5ML suspension 30 mL  30 mL Oral Q6H PRN Hayden Rasmussen, MD       Asenapine Maleate (SAPHRIS) sublingual tablet 5 mg  5 mg Sublingual BID PRN Darleene Cleaver, Silena Wyss, MD   5 mg at 04/11/22 0325   benztropine (COGENTIN) tablet 0.5 mg  0.5 mg Oral BID Leevy-Johnson, Brooke A, NP   0.5 mg at 04/11/22 0943   docusate sodium (COLACE) capsule 100 mg  100 mg Oral BID Howerter, Justin B, DO   100 mg at 04/11/22 0943   enoxaparin (LOVENOX) injection 40 mg  40 mg Subcutaneous Q24H Howerter, Justin B, DO   40 mg at 04/11/22 0942   haloperidol (HALDOL) tablet 10 mg  10 mg Oral QHS Hayden Rasmussen, MD   10 mg at 04/10/22 2117   [START ON 04/12/2022] haloperidol (HALDOL) tablet 5 mg  5 mg Oral QPC breakfast Earnest Mcgillis, MD       lactated ringers infusion   Intravenous Continuous Raiford Noble [place], DO 75 mL/hr at 04/11/22 1115 New Bag at 04/11/22 1115   lamoTRIgine (LAMICTAL) tablet 25 mg  25 mg Oral QHS Amber Williard, MD       lamoTRIgine (LAMICTAL) tablet 50 mg  50 mg Oral Daily Leanard Dimaio, MD   50 mg at 04/11/22 0942   liothyronine (CYTOMEL) tablet 25 mcg  25 mcg Oral Daily Hayden Rasmussen, MD   25 mcg at 04/11/22 0943   LORazepam (ATIVAN) tablet 1 mg  1 mg Oral Q1H PRN Raiford Noble Latif, DO   1 mg at 04/11/22 1438   nicotine polacrilex (NICORETTE) gum 2 mg  2 mg Oral PRN Hayden Rasmussen, MD   2 mg at 04/09/22 1541   ondansetron (ZOFRAN) tablet 4 mg  4 mg Oral Q8H PRN Hayden Rasmussen, MD       polyethylene glycol (MIRALAX / GLYCOLAX) packet 17 g  17 g Oral Daily PRN Howerter, Justin B, DO       propranolol (INDERAL) tablet 10 mg  10 mg Oral BID Leevy-Johnson, Brooke A, NP   10 mg at 04/11/22 [id number]   thiamine tablet 100 mg  100 mg Oral Daily  Hayden Rasmussen, MD   100 mg at 04/11/22 [id number]    Musculoskeletal: Strength & Muscle Tone: within normal limits Gait & Station: normal Patient leans: N/A    Psychiatric Specialty Exam:  Presentation  General Appearance: Appropriate for Environment  Eye Contact:Minimal  Speech:Clear and Coherent  Speech Volume:Increased  Handedness:Right   Mood and Affect  Mood:Irritable; Angry; Anxious  Affect:Congruent   Thought Process  Thought Processes:Disorganized  Descriptions of Associations:Tangential  Orientation:Partial  Thought Content:Illogical  History of Schizophrenia/Schizoaffective disorder:Yes  Duration of Psychotic Symptoms:Greater than six months  Hallucinations:Hallucinations: Auditory  Ideas of Reference:Paranoia  Suicidal Thoughts:Suicidal Thoughts: Yes, Passive SI Passive Intent and/or Plan: Without Intent; Without Plan  Homicidal Thoughts:Homicidal Thoughts: No   Sensorium  Memory:Immediate Fair; Recent Fair; Remote Poor  Judgment:Poor  Insight:Poor   Executive Functions  Concentration:Poor  Attention Span:Poor  [place]  Language:Good   Psychomotor Activity  Psychomotor Activity:Psychomotor Activity: Increased; Restlessness   Assets  Assets:Physical Health   Sleep  Sleep:Sleep: Poor   Physical Exam: Physical Exam Review of Systems  Psychiatric/Behavioral:  Positive for hallucinations, substance abuse and suicidal ideas. The patient is nervous/anxious and has insomnia.   Blood pressure (!) 151/96, pulse 79, temperature 98.1 F (36.7 C), temperature source Oral, resp. rate  16, weight 53.6 kg, SpO2 98 %. Body mass index is 22.33 kg/m.  Treatment Plan Summary: 31 year old female with history of mental illness and substance abuse who was admitted to the hospital for altered mental status. She remains easily frustrated, restless, confused, agitated, psychosis and suicidal. She is unable to  contract for safety. She will benefit from inpatient psychiatric admission when she is medically stabilized.  Recommendations: -Continue 1:1 sitter for safety -Continue Lorazepam 1 mg qhr PRN as needed for agitation -Continue Haldol 10 mg at bedtime for agitation -Add Haldol 5 mg daily after breakfast for psychosis -Continue Benztropine 0.5 mg BID for EPS prevention -Continue Saphris 5 mg S/L twice daily as needed for agitation -Increase Lamictal to 50 mg every morning and 25 mg at bedtime for mood stabilization-pls monitor patient for skin rashes -Consider social worker consult to facilitate inpatient psychiatric admission after pt is medically stabilized  Disposition: Recommend psychiatric Inpatient admission when medically cleared. Psychiatric service will follow the patient  Corena Pilgrim, MD 04/11/2022 3:55 PM

## 2022-04-11 NOTE — Progress Notes (Signed)
PROGRESS NOTE    Barbara Gordon  [id number] DOB: [birth year] DOA: 04/08/2022 PCP: Pcp, No   Brief Narrative:  The patient is a 31 year old Caucasian female with past medical history significant for but not limited to schizophrenia, polysubstance abuse, acquired hypothyroidism as well as other comorbidities who presented with altered mental status and acute encephalopathy after having worsening confusion in the [facility] emergency department while being on a psych hold via behavioral health.  Most of the history is provided by the EDP and chart reviewed.  Patient was reported on a psych hold LSO via behavioral health and starting 04/06/2022 had some subcu superimposed on schizophrenia.  Current time psychiatry would modify her outpatient meds including holding her home Prozac and increasing her outpatient Lamictal and additionally outpatient Haldol which was scheduled on a nightly basis.  Earlier on 04/09/2022 she received a visit from her boyfriend and became more confused, agitated prompting behavioral health to request medical admission to the hospital service for further evaluation of her acute encephalopathy.   Reportedly her boyfriend bought her a "drug from the street" but did not elaborate as to what the specific drug class of medication was and patient was without any specific complaints but became extremely agitated and denies any discomfort.  Imaging in the ED showed EKG with sinus tachycardia and labs were done and showed that she is positive for cannabis as well as benzodiazepines.  Assessment and Plan:  Acute Toxic Encephalopathy in the setting of unknown drug, improved -She had worsening confusion and agitation earlier while in psych hold in the ED after she took unspecified street drug from her boyfriend in the ED but is improved is improving now. -Patient has a history of polysubstance abuse and includes cannabis, amphetamines, meant methamphetamines and cocaine -UDS was positive for  benzodiazepines and THC -Normal elliptic malignant syndrome appears less likely given her low-dose of daily Haldol -Serotonin syndrome appears less likely clinically including her history of holding her Prozac by psych during a psych hold -Her differential includes withdrawal given her polysubstance abuse and she is no underlying metabolic contributions at this time with no overt evidence of infectious processes noted -Urinalysis was relatively unremarkable and showed rare bacteria but negative nitrites, negative leukocytes -Etyhl alcohol is less than 10 -Chest x-ray showed no active disease -Continued with LR at 125 MLS per hour and will reduce rate to 75 mL/hr for 1 more day -She did have a mildly elevated CK level but this is trending down with fluid hydration and is now 274 -> 171 -Clinically she appears improved we will place her on delirium precautions   Metabolic Acidosis,  improved  -Mild with a CO2 of 21, anion gap of 8, chloride level 108; Now CO2 is 26, AG is 7, Chloride Level is 109 -C/w LR at 125 mL/hr and reduced rate to 75 mL/hr -Monitor and trend and repeat CMP in a.m.   Mildly Elevated CK level -CK was noted to be 420 and repeat is trending down and went to 274 -> 171 -Continue IV fluid hydration as above and urinalysis was obtained to check for myoglobinuria and which was negative -She does have an increased risk for rhabdomyolysis given her documented history of polysubstance abuse and history of taking stimulants  Vaginal Pain -Patient states that she has pain but is nonspecific but then states that she is having "pain in her vagina" -Check GC screen -Urinalysis was unremarkable   Acquired Hypothyroidism -TSH was 0.074 and Free T4 was 1.00 with Triiodothyronine Free  was 5.4 -C/w Liothyronine 25 mcg po Daily    Schizophrenia -She was on a cycle being managed by behavioral health -Psych to assess and they were unable to do a full psychiatric evaluation given her  being combative and unable to give relevant information to her mental health -They are recommending a one-to-one sitter -Psychiatry is also recommending lorazepam 1 mg every hour as needed for agitation as well as continue Haldol 10 mg p.o. nightly for agitation -Psych recommends continuing benztropine 0.5 mg p.o. twice daily for EPS prevention as well as adding Saphris 5 mg sublingually twice daily for as needed agitation -Continue with lamotrigine 50 mg p.o. daily as well as propanolol 10 mg p.o. twice daily -Her SSRI with Prozac is being held  DVT prophylaxis: enoxaparin (LOVENOX) injection 40 mg Start: 04/10/22 0900    Code Status: Full Code Family Communication: No family present at bedside   Disposition Plan:  Level of care: Progressive Status is: Inpatient Remains inpatient appropriate because: Psychiatric clearance and inpatient [facility] now that she is medically stable for discharge   Consultants:  Psychiatry   Procedures:  None  Antimicrobials:  Anti-infectives (From admission, onward)    None       Subjective: Seen and examined at bedside and she is doing okay and denies any pain to me but was screaming earlier nausea improved.  Appears a bit calmer and states that she "does not know what is wrong".  Also complained to the nurse about having some "vaginal pain" but not to me.  States that she does not want speak with a psychiatrist.  No other concerns or complaints at this time.  Objective: Vitals:   04/10/22 2105 04/11/22 0556 04/11/22 0700 04/11/22 0840  BP: 134/78 111/73  122/71  Pulse: 98 92  88  Resp: 18 18  18   Temp: 98.6 F (37 C) 98.1 F (36.7 C)  98.8 F (37.1 C)  TempSrc: Oral Oral  Oral  SpO2: 100% 99%  98%  Weight:   53.6 kg     Intake/Output Summary (Last 24 hours) at 04/11/2022 1222 Last data filed at 04/11/2022 1200 Gross per 24 hour  Intake 3847.77 ml  Output 600 ml  Net 3247.77 ml   Filed Weights   04/10/22 0500 04/11/22 0700  Weight: 56.1  kg 53.6 kg   Examination: Physical Exam:  Constitutional: WN/WD thin Caucasian female currently who was earlier upset and now is calmer Respiratory: Diminished to auscultation bilaterally, no wheezing, rales, rhonchi or crackles. Normal respiratory effort and patient is not tachypenic. No accessory muscle use.  Unlabored breathing Cardiovascular: RRR, no murmurs / rubs / gallops. S1 and S2 auscultated.   Abdomen: Soft, non-tender, non-distended.  GU: Deferred. Musculoskeletal: No clubbing / cyanosis of digits/nails. No joint deformity upper and lower extremities.  Neurologic: CN 2-12 grossly intact with no focal deficits. Psychiatric: Appears extremely anxious and was earlier tearful  Data Reviewed: I have personally reviewed following labs and imaging studies  CBC: Recent Labs  Lab 04/08/22 0948 04/11/22 0405  WBC 8.6 4.3  NEUTROABS 5.2 1.7  HGB 16.1* 12.4  HCT 45.9 35.3*  MCV 92.0 91.9  PLT 343 [id number]   Basic Metabolic Panel: Recent Labs  Lab 04/08/22 0948 04/09/22 1614 04/10/22 0706 04/10/22 0722 04/11/22 0405  NA 138 138  --  137 142  K 4.4 3.9  --  4.0 3.7  CL 107 104  --  108 109  CO2 21* 22  --  21* 26  GLUCOSE  82 104*  --  108* 114*  BUN 13 18  --  9 <5*  CREATININE 0.68 0.71  --  0.58 0.61  CALCIUM 10.0 10.0  --  9.0 9.9  MG  --  2.2  --  1.9 2.1  PHOS  --   --  2.8  --  3.3   GFR: Estimated Creatinine Clearance: 78.3 mL/min (by C-G formula based on SCr of 0.61 mg/dL). Liver Function Tests: Recent Labs  Lab 04/08/22 0948 04/10/22 0722 04/11/22 0405  AST 32 35 29  ALT 39 31 29  ALKPHOS 47 32* 35*  BILITOT 1.0 1.0 0.7  PROT 9.2* 6.5 7.0  ALBUMIN 5.3* 4.0 4.2   No results for input(s): LIPASE, AMYLASE in the last 168 hours. Recent Labs  Lab 04/11/22 0405  AMMONIA 29   Coagulation Profile: Recent Labs  Lab 04/10/22 0722  INR 1.1   Cardiac Enzymes: Recent Labs  Lab 04/09/22 1614 04/10/22 0722 04/11/22 0405  CKTOTAL 420* 274* 171   BNP  (last 3 results) No results for input(s): PROBNP in the last 8760 hours. HbA1C: No results for input(s): HGBA1C in the last 72 hours. CBG: Recent Labs  Lab 04/08/22 0715  GLUCAP 153*   Lipid Profile: No results for input(s): CHOL, HDL, LDLCALC, TRIG, CHOLHDL, LDLDIRECT in the last 72 hours. Thyroid Function Tests: Recent Labs    04/09/22 1004  TSH 0.074*  FREET4 1.00  T3FREE 5.4*   Anemia Panel: No results for input(s): VITAMINB12, FOLATE, FERRITIN, TIBC, IRON, RETICCTPCT in the last 72 hours. Sepsis Labs: No results for input(s): PROCALCITON, LATICACIDVEN in the last 168 hours.  Recent Results (from the past 240 hour(s))  Resp Panel by RT-PCR (Flu A&B, Covid) Anterior Nasal Swab     Status: None   Collection Time: 04/06/22  7:51 PM   Specimen: Anterior Nasal Swab  Result Value Ref Range Status   SARS Coronavirus 2 by RT PCR NEGATIVE NEGATIVE Final    Comment: (NOTE) SARS-CoV-2 target nucleic acids are NOT DETECTED.  The SARS-CoV-2 RNA is generally detectable in upper respiratory specimens during the acute phase of infection. The lowest concentration of SARS-CoV-2 viral copies this assay can detect is 138 copies/mL. A negative result does not preclude SARS-Cov-2 infection and should not be used as the sole basis for treatment or other patient management decisions. A negative result may occur with  improper specimen collection/handling, submission of specimen other than nasopharyngeal swab, presence of viral mutation(s) within the areas targeted by this assay, and inadequate number of viral copies(<138 copies/mL). A negative result must be combined with clinical observations, patient history, and epidemiological information. The expected result is Negative.  Fact Sheet for Patients:  EntrepreneurPulse.com.au  Fact Sheet for Healthcare Providers:  IncredibleEmployment.be  This test is no t yet approved or cleared by the [place] FDA and  has been authorized for detection and/or diagnosis of SARS-CoV-2 by FDA under an Emergency Use Authorization (EUA). This EUA will remain  in effect (meaning this test can be used) for the duration of the COVID-19 declaration under Section 564(b)(1) of the Act, 21 U.S.C.section 360bbb-3(b)(1), unless the authorization is terminated  or revoked sooner.       Influenza A by PCR NEGATIVE NEGATIVE Final   Influenza B by PCR NEGATIVE NEGATIVE Final    Comment: (NOTE) The Xpert Xpress SARS-CoV-2/FLU/RSV plus assay is intended as an aid in the diagnosis of influenza from Nasopharyngeal swab specimens and should not be used as a sole  basis for treatment. Nasal washings and aspirates are unacceptable for Xpert Xpress SARS-CoV-2/FLU/RSV testing.  Fact Sheet for Patients: EntrepreneurPulse.com.au  Fact Sheet for Healthcare Providers: IncredibleEmployment.be  This test is not yet approved or cleared by the [place] FDA and has been authorized for detection and/or diagnosis of SARS-CoV-2 by FDA under an Emergency Use Authorization (EUA). This EUA will remain in effect (meaning this test can be used) for the duration of the COVID-19 declaration under Section 564(b)(1) of the Act, 21 U.S.C. section 360bbb-3(b)(1), unless the authorization is terminated or revoked.  Performed at [facility], [place] [address], [place], [place] [address]   SARS Coronavirus 2 by RT PCR (hospital order, performed in [facility] hospital lab) *cepheid single result test* Anterior Nasal Swab     Status: None   Collection Time: 04/08/22  7:45 AM   Specimen: Anterior Nasal Swab  Result Value Ref Range Status   SARS Coronavirus 2 by RT PCR NEGATIVE NEGATIVE Final    Comment: (NOTE) SARS-CoV-2 target nucleic acids are NOT DETECTED.  The SARS-CoV-2 RNA is generally detectable in upper and lower respiratory specimens during the acute phase of  infection. The lowest concentration of SARS-CoV-2 viral copies this assay can detect is 250 copies / mL. A negative result does not preclude SARS-CoV-2 infection and should not be used as the sole basis for treatment or other patient management decisions.  A negative result may occur with improper specimen collection / handling, submission of specimen other than nasopharyngeal swab, presence of viral mutation(s) within the areas targeted by this assay, and inadequate number of viral copies (<250 copies / mL). A negative result must be combined with clinical observations, patient history, and epidemiological information.  Fact Sheet for Patients:   https://www.patel.info/  Fact Sheet for Healthcare Providers: https://hall.com/  This test is not yet approved or  cleared by the [place] FDA and has been authorized for detection and/or diagnosis of SARS-CoV-2 by FDA under an Emergency Use Authorization (EUA).  This EUA will remain in effect (meaning this test can be used) for the duration of the COVID-19 declaration under Section 564(b)(1) of the Act, 21 U.S.C. section 360bbb-3(b)(1), unless the authorization is terminated or revoked sooner.  Performed at [facility], [place] [address], [place], [place] [address]     Radiology Studies: [facility] Chest Port 1 View  Result Date: 04/10/2022 CLINICAL DATA:  Acute encephalopathy. EXAM: PORTABLE CHEST 1 VIEW COMPARISON:  None Available. FINDINGS: The heart size and mediastinal contours are within normal limits. Both lungs are clear. The visualized skeletal structures are unremarkable. IMPRESSION: No active disease. Electronically Signed   By: Marlaine Hind M.D.   On: 04/10/2022 08:39    Scheduled Meds:  benztropine  0.5 mg Oral BID   docusate sodium  100 mg Oral BID   enoxaparin (LOVENOX) injection  40 mg Subcutaneous Q24H   haloperidol  10 mg Oral QHS   lamoTRIgine  50 mg Oral Daily    liothyronine  25 mcg Oral Daily   propranolol  10 mg Oral BID   thiamine  100 mg Oral Daily   Continuous Infusions:  lactated ringers 75 mL/hr at 04/11/22 1115    LOS: 1 day   Kerney Elbe, DO [facility] Available via Epic secure chat 7am-7pm After these hours, please refer to coverage provider listed on [email or web address] 04/11/2022, 12:22 PM

## 2022-04-11 NOTE — Progress Notes (Signed)
Patient's boyfriend attempted to visit. Explained to him that she is not allowed visitors at this time. He was polite and left. Patient saw him through the window to her door. Became increasingly hostile to sitter and attempting to leave. Was able to redirect to bed. Sitter remains at bedside.

## 2022-04-12 LAB — CBC WITH DIFFERENTIAL/PLATELET
Abs Immature Granulocytes: 0.02 10*3/uL (ref 0.00–0.07)
Basophils Absolute: 0 10*3/uL (ref 0.0–0.1)
Basophils Relative: 0 %
Eosinophils Absolute: 0.2 10*3/uL (ref 0.0–0.5)
Eosinophils Relative: 3 %
HCT: 36.1 % (ref 36.0–46.0)
Hemoglobin: 13 g/dL (ref 12.0–15.0)
Immature Granulocytes: 0 %
Lymphocytes Relative: 25 %
Lymphs Abs: 1.5 10*3/uL (ref 0.7–4.0)
MCH: 32.6 pg (ref 26.0–34.0)
MCHC: 36 g/dL (ref 30.0–36.0)
MCV: 90.5 fL (ref 80.0–100.0)
Monocytes Absolute: 0.3 10*3/uL (ref 0.1–1.0)
Monocytes Relative: 5 %
Neutro Abs: 4 10*3/uL (ref 1.7–7.7)
Neutrophils Relative %: 67 %
Platelets: 263 10*3/uL (ref 150–400)
RBC: 3.99 MIL/uL (ref 3.87–5.11)
RDW: 11.8 % (ref 11.5–15.5)
WBC: 6.1 10*3/uL (ref 4.0–10.5)
nRBC: 0 % (ref 0.0–0.2)

## 2022-04-12 LAB — COMPREHENSIVE METABOLIC PANEL
ALT: 30 U/L (ref 0–44)
AST: 27 U/L (ref 15–41)
Albumin: 4.3 g/dL (ref 3.5–5.0)
Alkaline Phosphatase: 40 U/L (ref 38–126)
Anion gap: 9 (ref 5–15)
BUN: 7 mg/dL (ref 6–20)
CO2: 24 mmol/L (ref 22–32)
Calcium: 9.4 mg/dL (ref 8.9–10.3)
Chloride: 108 mmol/L (ref 98–111)
Creatinine, Ser: 0.53 mg/dL (ref 0.44–1.00)
GFR, Estimated: >60 mL/min (ref >60)
Glucose, Bld: 104 mg/dL — ABNORMAL HIGH (ref 70–99)
Potassium: 3.7 mmol/L (ref 3.5–5.1)
Sodium: 141 mmol/L (ref 135–145)
Total Bilirubin: 0.7 mg/dL (ref 0.3–1.2)
Total Protein: 7.3 g/dL (ref 6.5–8.1)

## 2022-04-12 LAB — GC/CHLAMYDIA PROBE AMP (~~LOC~~) NOT AT ARMC
Chlamydia: NEGATIVE
Comment: NEGATIVE
Comment: NORMAL
Neisseria Gonorrhea: NEGATIVE

## 2022-04-12 LAB — MAGNESIUM: Magnesium: 2 mg/dL (ref 1.7–2.4)

## 2022-04-12 LAB — PHOSPHORUS: Phosphorus: 2.9 mg/dL (ref 2.5–4.6)

## 2022-04-12 MED ORDER — LIP MEDEX EX OINT
TOPICAL_OINTMENT | CUTANEOUS | Status: DC | PRN
  Filled 2022-04-12: qty 7

## 2022-04-12 NOTE — TOC Progression Note (Signed)
Transition of Care [facility]) - Progression Note    Patient Details  Name: Barbara Gordon MRN: [id number] Date of Birth: [birth year]  Transition of [facility]) CM/SW Contact  Geni Bers, RN Phone Number: 04/12/2022, 1:53 PM  Clinical Narrative:      Pt faxed to other Psych Facilities.       Expected Discharge Plan and Services                                                 Social Determinants of Health (SDOH) Interventions    Readmission Risk Interventions     View : No data to display.
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
## 2022-04-12 NOTE — Progress Notes (Signed)
PROGRESS NOTE    Barbara Gordon  DVV:[id number] DOB: 1991-11-07 DOA: 04/08/2022 PCP: Pcp, No   Brief Narrative:  The patient is a 31 year old Caucasian female with past medical history significant for but not limited to schizophrenia, polysubstance abuse, acquired hypothyroidism as well as other comorbidities who presented with altered mental status and acute encephalopathy after having worsening confusion in the [facility] emergency department while being on a psych hold via behavioral health.  Most of the history is provided by the EDP and chart reviewed.  Patient was reported on a psych hold [facility] via behavioral health and starting 04/06/2022 had some subcu superimposed on schizophrenia.  Current time psychiatry would modify her outpatient meds including holding her home Prozac and increasing her outpatient Lamictal and additionally outpatient Haldol which was scheduled on a nightly basis.  Earlier on 04/09/2022 she received a visit from her boyfriend and became more confused, agitated prompting behavioral health to request medical admission to the hospital service for further evaluation of her acute encephalopathy.   Reportedly her boyfriend bought her a "drug from the street" but did not elaborate as to what the specific drug class of medication was and patient was without any specific complaints but became extremely agitated and denies any discomfort.  Imaging in the ED showed EKG with sinus tachycardia and labs were done and showed that she is positive for cannabis as well as benzodiazepines.  Assessment and Plan:  Acute Toxic Encephalopathy in the setting of unknown drug, improved -She had worsening confusion and agitation earlier while in psych hold in the ED after she took unspecified street drug from her boyfriend in the ED but is improved is improving now. -Patient has a history of polysubstance abuse and includes cannabis, amphetamines, meant methamphetamines and cocaine -UDS was positive for  benzodiazepines and THC -Normal elliptic malignant syndrome appears less likely given her low-dose of daily Haldol -Serotonin syndrome appears less likely clinically including her history of holding her Prozac by psych during a psych hold -Her differential includes withdrawal given her polysubstance abuse and she is no underlying metabolic contributions at this time with no overt evidence of infectious processes noted -Urinalysis was relatively unremarkable and showed rare bacteria but negative nitrites, negative leukocytes -Etyhl alcohol is less than 10 -WBC went from 4.3 is now 6.1 -Chest x-ray showed no active disease -Stop -She did have a mildly elevated CK level but this is trending down with fluid hydration and is now 274 -> 171 -Clinically she appears improved we will place her on delirium precautions -Patient is medically stable to discharge to inpatient psychiatric facility   Metabolic Acidosis,  improved  -Mild and improved. CO2 is now 24, AG is 9, Chloride Level is 108 -C/w LR at 125 mL/hr and reduced rate to 75 mL/hr -Monitor and trend and repeat CMP in a.m.   Mildly Elevated CK level, improved  -CK was noted to be 420 and repeat is trending down and went to 274 -> 171 -Continue IV fluid hydration as above and urinalysis was obtained to check for myoglobinuria and which was negative -She does have an increased risk for rhabdomyolysis given her documented history of polysubstance abuse and history of taking stimulants   Vaginal Pain -Patient states that she has pain but is nonspecific but then states that she is having "pain in her vagina" -Check GC screen -Urinalysis was unremarkable   Acquired Hypothyroidism -TSH was 0.074 and Free T4 was 1.00 with Triiodothyronine Free was 5.4 -C/w Liothyronine 25 mcg po Daily  Schizophrenia -Being managed by behavioral health -Psych to assess and they were unable to do a full psychiatric evaluation given her being combative and  unable to give relevant information to her mental health -They are recommending a one-to-one sitter -Psychiatry is also recommending lorazepam 1 mg every hour as needed for agitation as well as continue Haldol 10 mg p.o. nightly for agitation -Psych recommends continuing benztropine 0.5 mg p.o. twice daily for EPS prevention as well as adding Saphris 5 mg sublingually twice daily for as needed agitation -Continue with lamotrigine 50 mg p.o. daily as well as propanolol 10 mg p.o. twice daily -Now psychiatry has added 5 mg of Haldol in the a.m. -Her SSRI with Prozac is being held   DVT prophylaxis: enoxaparin (LOVENOX) injection 40 mg Start: 04/10/22 0900    Code Status: Full Code Family Communication: No family currently at bedside  Disposition Plan:  Level of care: Progressive Status is: Inpatient Remains inpatient appropriate because: She is medically stable to be discharged but needs to go to inpatient psychiatric facility and does not have a bed available   Consultants:  Psychiatry  Procedures:  None  Antimicrobials:  Anti-infectives (From admission, onward)    None      Subjective: Seen and examined at bedside and she is calmer this morning.  Complained about some mild pain and states that it was "in her mouth".  No nausea or vomiting.  Denies any lightheadedness or dizziness.  Feels okay.  No other concerns or complaints at this time and asking about when she can go home.  Objective: Vitals:   04/11/22 2141 04/12/22 0500 04/12/22 0547 04/12/22 0733  BP: 116/74  137/85 (!) 143/100  Pulse: 92  86 80  Resp: Temp: 97.7 F (36.5 C)  98 F (36.7 C) 97.9 F (36.6 C)  TempSrc: Oral     SpO2: 98%  97% 98%  Weight:  53.7 kg      Intake/Output Summary (Last 24 hours) at 04/12/2022 1235 Last data filed at 04/12/2022 1221 Gross per 24 hour  Intake 2932.53 ml  Output 1 ml  Net 2931.53 ml   Filed Weights   04/10/22 0500 04/11/22 0700 04/12/22 0500  Weight: 56.1  kg 53.6 kg 53.7 kg   Examination: Physical Exam:  Constitutional: WN/WD thin Caucasian currently no acute distress appears calmer Respiratory: Diminished to auscultation bilaterally, no wheezing, rales, rhonchi or crackles. Normal respiratory effort and patient is not tachypenic. No accessory muscle use.  Unlabored breathing Cardiovascular: RRR, no murmurs / rubs / gallops. S1 and S2 auscultated. No extremity edema.  Abdomen: Soft, non-tender, non-distended. Bowel sounds positive.  GU: Deferred. Musculoskeletal: No clubbing / cyanosis of digits/nails. No joint deformity upper and lower extremities.  Neurologic: CN 2-12 grossly intact with no focal deficits.  Psychiatric: Appears calmer today  Data Reviewed: I have personally reviewed following labs and imaging studies  CBC: Recent Labs  Lab 04/08/22 0948 04/11/22 0405 04/12/22 0729  WBC 8.6 4.3 6.1  NEUTROABS 5.2 1.7 4.0  HGB 16.1* 12.4 13.0  HCT 45.9 35.3* 36.1  MCV 92.0 91.9 90.5  PLT 343 260 263   Basic Metabolic Panel: Recent Labs  Lab 04/08/22 0948 04/09/22 1614 04/10/22 0706 04/10/22 0722 04/11/22 0405 04/12/22 0729  NA 138 138  --  137 142 141  K 4.4 3.9  --  4.0 3.7 3.7  CL 107 104  --  108 109 108  CO2 21* 22  --  21* 26  24  GLUCOSE 82 104*  --  108* 114* 104*  BUN 13 18  --  9 <5* 7  CREATININE 0.68 0.71  --  0.58 0.61 0.53  CALCIUM 10.0 10.0  --  9.0 9.9 9.4  MG  --  2.2  --  1.9 2.1 2.0  PHOS  --   --  2.8  --  3.3 2.9   GFR: Estimated Creatinine Clearance: 78.3 mL/min (by C-G formula based on SCr of 0.53 mg/dL). Liver Function Tests: Recent Labs  Lab 04/08/22 0948 04/10/22 0722 04/11/22 0405 04/12/22 0729  AST 32 35 29 27  ALT 39 31 29 30   ALKPHOS 47 32* 35* 40  BILITOT 1.0 1.0 0.7 0.7  PROT 9.2* 6.5 7.0 7.3  ALBUMIN 5.3* 4.0 4.2 4.3   No results for input(s): LIPASE, AMYLASE in the last 168 hours. Recent Labs  Lab 04/11/22 0405  AMMONIA 29   Coagulation Profile: Recent Labs  Lab  04/10/22 0722  INR 1.1   Cardiac Enzymes: Recent Labs  Lab 04/09/22 1614 04/10/22 0722 04/11/22 0405  CKTOTAL 420* 274* 171   BNP (last 3 results) No results for input(s): PROBNP in the last 8760 hours. HbA1C: No results for input(s): HGBA1C in the last 72 hours. CBG: Recent Labs  Lab 04/08/22 0715  GLUCAP 153*   Lipid Profile: No results for input(s): CHOL, HDL, LDLCALC, TRIG, CHOLHDL, LDLDIRECT in the last 72 hours. Thyroid Function Tests: No results for input(s): TSH, T4TOTAL, FREET4, T3FREE, THYROIDAB in the last 72 hours. Anemia Panel: No results for input(s): VITAMINB12, FOLATE, FERRITIN, TIBC, IRON, RETICCTPCT in the last 72 hours. Sepsis Labs: No results for input(s): PROCALCITON, LATICACIDVEN in the last 168 hours.  Recent Results (from the past 240 hour(s))  Resp Panel by RT-PCR (Flu A&B, Covid) Anterior Nasal Swab     Status: None   Collection Time: 04/06/22  7:51 PM   Specimen: Anterior Nasal Swab  Result Value Ref Range Status   SARS Coronavirus 2 by RT PCR NEGATIVE NEGATIVE Final    Comment: (NOTE) SARS-CoV-2 target nucleic acids are NOT DETECTED.  The SARS-CoV-2 RNA is generally detectable in upper respiratory specimens during the acute phase of infection. The lowest concentration of SARS-CoV-2 viral copies this assay can detect is 138 copies/mL. A negative result does not preclude SARS-Cov-2 infection and should not be used as the sole basis for treatment or other patient management decisions. A negative result may occur with  improper specimen collection/handling, submission of specimen other than nasopharyngeal swab, presence of viral mutation(s) within the areas targeted by this assay, and inadequate number of viral copies(<138 copies/mL). A negative result must be combined with clinical observations, patient history, and epidemiological information. The expected result is Negative.  Fact Sheet for Patients:   04/08/22  Fact Sheet for Healthcare Providers:  BloggerCourse.com  This test is no t yet approved or cleared by the [place] FDA and  has been authorized for detection and/or diagnosis of SARS-CoV-2 by FDA under an Emergency Use Authorization (EUA). This EUA will remain  in effect (meaning this test can be used) for the duration of the COVID-19 declaration under Section 564(b)(1) of the Act, 21 U.S.C.section 360bbb-3(b)(1), unless the authorization is terminated  or revoked sooner.       Influenza A by PCR NEGATIVE NEGATIVE Final   Influenza B by PCR NEGATIVE NEGATIVE Final    Comment: (NOTE) The Xpert Xpress SARS-CoV-2/FLU/RSV plus assay is intended as an aid in the diagnosis of  influenza from Nasopharyngeal swab specimens and should not be used as a sole basis for treatment. Nasal washings and aspirates are unacceptable for Xpert Xpress SARS-CoV-2/FLU/RSV testing.  Fact Sheet for Patients: BloggerCourse.comhttps://www.fda.gov/media/152166/download  Fact Sheet for Healthcare Providers: SeriousBroker.ithttps://www.fda.gov/media/152162/download  This test is not yet approved or cleared by the [place] FDA and has been authorized for detection and/or diagnosis of SARS-CoV-2 by FDA under an Emergency Use Authorization (EUA). This EUA will remain in effect (meaning this test can be used) for the duration of the COVID-19 declaration under Section 564(b)(1) of the Act, 21 U.S.C. section 360bbb-3(b)(1), unless the authorization is terminated or revoked.  Performed at [facility], [address] [place], [place] [address]   SARS Coronavirus 2 by RT PCR (hospital order, performed in [facility] hospital lab) *cepheid single result test* Anterior Nasal Swab     Status: None   Collection Time: 04/08/22  7:45 AM   Specimen: Anterior Nasal Swab  Result Value Ref Range Status   SARS Coronavirus 2 by RT PCR NEGATIVE NEGATIVE Final     Comment: (NOTE) SARS-CoV-2 target nucleic acids are NOT DETECTED.  The SARS-CoV-2 RNA is generally detectable in upper and lower respiratory specimens during the acute phase of infection. The lowest concentration of SARS-CoV-2 viral copies this assay can detect is 250 copies / mL. A negative result does not preclude SARS-CoV-2 infection and should not be used as the sole basis for treatment or other patient management decisions.  A negative result may occur with improper specimen collection / handling, submission of specimen other than nasopharyngeal swab, presence of viral mutation(s) within the areas targeted by this assay, and inadequate number of viral copies (<250 copies / mL). A negative result must be combined with clinical observations, patient history, and epidemiological information.  Fact Sheet for Patients:   RoadLapTop.co.zahttps://www.fda.gov/media/158405/download  Fact Sheet for Healthcare Providers: http://kim-miller.com/https://www.fda.gov/media/158404/download  This test is not yet approved or  cleared by the [place] FDA and has been authorized for detection and/or diagnosis of SARS-CoV-2 by FDA under an Emergency Use Authorization (EUA).  This EUA will remain in effect (meaning this test can be used) for the duration of the COVID-19 declaration under Section 564(b)(1) of the Act, 21 U.S.C. section 360bbb-3(b)(1), unless the authorization is terminated or revoked sooner.  Performed at [facility], [address] [place], [place] [address]     Radiology Studies: No results found.  Scheduled Meds:  benztropine  0.5 mg Oral BID   docusate sodium  100 mg Oral BID   enoxaparin (LOVENOX) injection  40 mg Subcutaneous Q24H   haloperidol  10 mg Oral QHS   haloperidol  5 mg Oral QPC breakfast   lamoTRIgine  25 mg Oral QHS   lamoTRIgine  50 mg Oral Daily   liothyronine  25 mcg Oral Daily   propranolol  10 mg Oral BID   thiamine  100 mg Oral Daily   Continuous  Infusions:   LOS: 2 days   Marguerita Merlesmair Treshon Stannard, DO [facility] Available via Epic secure chat 7am-7pm After these hours, please refer to coverage provider listed on [email or web address] 04/12/2022, 12:35 PM

## 2022-04-13 NOTE — Progress Notes (Signed)
MD aware that patient no longer has IV access. No need to reinsert. Also may D/C from cardiac monitoring.

## 2022-04-13 NOTE — Progress Notes (Signed)
PROGRESS NOTE    Barbara Gordon  WUJ:[id number] DOB: 08-24-1991 DOA: 04/08/2022 PCP: Pcp, No   Brief Narrative:  The patient is a 31 year old Caucasian female with past medical history significant for but not limited to schizophrenia, polysubstance abuse, acquired hypothyroidism as well as other comorbidities who presented with altered mental status and acute encephalopathy after having worsening confusion in the [facility] emergency department while being on a psych hold via behavioral health.  Most of the history is provided by the EDP and chart reviewed.  Patient was reported on a psych hold [facility] via behavioral health and starting 04/06/2022 had some subcu superimposed on schizophrenia.  Current time psychiatry would modify her outpatient meds including holding her home Prozac and increasing her outpatient Lamictal and additionally outpatient Haldol which was scheduled on a nightly basis.  Earlier on 04/09/2022 she received a visit from her boyfriend and became more confused, agitated prompting behavioral health to request medical admission to the hospital service for further evaluation of her acute encephalopathy.   Reportedly her boyfriend bought her a "drug from the street" but did not elaborate as to what the specific drug class of medication was and patient was without any specific complaints but became extremely agitated and denies any discomfort.  Imaging in the ED showed EKG with sinus tachycardia and labs were done and showed that she is positive for cannabis as well as benzodiazepines.  From a medical standpoint she is much improved but she remains floridly psychotic.  Per psychiatry will need to assist with surges of visitors or even potentially restrict visitors given her reports of her boyfriends supplying schizophrenogenic illicit substances earlier this hospitalization.   Assessment and Plan: Acute Toxic Encephalopathy in the setting of unknown drug, improved -She had worsening confusion and  agitation earlier while in psych hold in the ED after she took unspecified street drug from her boyfriend in the ED but is improved is improving now. -Patient has a history of polysubstance abuse and includes cannabis, amphetamines, meant methamphetamines and cocaine -UDS was positive for benzodiazepines and THC -Normal elliptic malignant syndrome appears less likely given her low-dose of daily Haldol -Serotonin syndrome appears less likely clinically including her history of holding her Prozac by psych during a psych hold -Her differential includes withdrawal given her polysubstance abuse and she is no underlying metabolic contributions at this time with no overt evidence of infectious processes noted -Urinalysis was relatively unremarkable and showed rare bacteria but negative nitrites, negative leukocytes -Etyhl alcohol is less than 10 -WBC went from 4.3 is now 6.1 -Chest x-ray showed no active disease -Stop -She did have a mildly elevated CK level but this is trending down with fluid hydration and is now 274 -> 171 -Clinically she appears improved we will place her on delirium precautions -Patient is medically stable to discharge to inpatient psychiatric facility   Metabolic Acidosis,  improved  -Mild and improved. CO2 is now 24, AG is 9, Chloride Level is 108 on last check  -IVF now stopped -Monitor and trend and repeat CMP in a.m.   Mildly Elevated CK level, improved  -CK was noted to be 420 and repeat is trending down and went to 274 -> 171 -Continue IV fluid hydration as above and urinalysis was obtained to check for myoglobinuria and which was negative -She does have an increased risk for rhabdomyolysis given her documented history of polysubstance abuse and history of taking stimulants   Vaginal Pain -Patient states that she has pain but is nonspecific but  then states that she is having "pain in her vagina" -Check GC screen -Urinalysis was unremarkable   Acquired  Hypothyroidism -TSH was 0.074 and Free T4 was 1.00 with Triiodothyronine Free was 5.4 -C/w Liothyronine 25 mcg po Daily    Schizophrenia -Being managed by behavioral health -Psych to assess and they were unable to do a full psychiatric evaluation given her being combative and unable to give relevant information to her mental health -They are recommending a one-to-one sitter -Psychiatry is also recommending lorazepam 1 mg every hour as needed for agitation as well as continue Haldol 10 mg p.o. nightly for agitation -Psych recommends continuing benztropine 0.5 mg p.o. twice daily for EPS prevention as well as adding Saphris 5 mg sublingually twice daily for as needed agitation -Continue with lamotrigine 50 mg p.o. daily as well as propanolol 10 mg p.o. twice daily -Now psychiatry has added 5 mg of Haldol in the a.m. -Her SSRI with Prozac is being held due to possible over-stimulation -Medically stable to D/C and needs Inpatient Psychiatric Hospitalization but no bed available and difficult to place given lack of insurance  DVT prophylaxis: enoxaparin (LOVENOX) injection 40 mg Start: 04/10/22 0900    Code Status: Full Code Family Communication: No family present at bedside   Disposition Plan:  Level of care: Progressive Status is: Inpatient Remains inpatient appropriate because: Needs Psych Hospitalization   Consultants:  Psychiatry   Procedures:  None  Antimicrobials:  Anti-infectives (From admission, onward)    None       Subjective: Seen and examined at bedside and she still remains a little agitated.  Complaining of some mild pain but does not describe where.  No nausea or vomiting.  Denies any lightheadedness or dizziness.  States that she had no swelling in her legs.  No other concerns at this time.  Objective: Vitals:   04/12/22 1358 04/12/22 2144 04/12/22 2144 04/13/22 0633  BP: (!) 121/91 117/70 117/70 110/68  Pulse: 97 88 88 92  Resp: 18  16 16   Temp: 97.7 F  (36.5 C)  97.9 F (36.6 C) 97.7 F (36.5 C)  TempSrc: Oral  Oral Oral  SpO2: 99%  98% 99%  Weight:    53.9 kg    Intake/Output Summary (Last 24 hours) at 04/13/2022 1146 Last data filed at 04/12/2022 2150 Gross per 24 hour  Intake 1250 ml  Output 1 ml  Net 1249 ml   Filed Weights   04/11/22 0700 04/12/22 0500 04/13/22 06/13/22  Weight: 53.6 kg 53.7 kg 53.9 kg   Examination: Physical Exam:  Constitutional: Well-nourished, well-developed thin Caucasian female currently no acute distress  Respiratory: Diminished to auscultation bilaterally, no wheezing, rales, rhonchi or crackles. Normal respiratory effort and patient is not tachypenic. No accessory muscle use.  Unlabored breathing Cardiovascular: RRR, no murmurs / rubs / gallops. S1 and S2 auscultated. No extremity edema.  Abdomen: Soft, non-tender, nondistended.  Bowel sounds positive.  GU: Deferred. Musculoskeletal: No clubbing / cyanosis of digits/nails. No joint deformity upper and lower extremities.  Neurologic: CN 2-12 grossly intact with no focal deficits. Psychiatric: Appears anxious   Data Reviewed: I have personally reviewed following labs and imaging studies  CBC: Recent Labs  Lab 04/08/22 0948 04/11/22 0405 04/12/22 0729  WBC 8.6 4.3 6.1  NEUTROABS 5.2 1.7 4.0  HGB 16.1* 12.4 13.0  HCT 45.9 35.3* 36.1  MCV 92.0 91.9 90.5  PLT 343 260 263   Basic Metabolic Panel: Recent Labs  Lab 04/08/22 0948 04/09/22 1614 04/10/22  0706 04/10/22 0722 04/11/22 0405 06/05/231610 0729  NA 138 138  --  137 142 141  K 4.4 3.9  --  4.0 3.7 3.7  CL 107 104  --  108 109 108  CO2 21* 22  --  21* 26 24  GLUCOSE 82 104*  --  108* 114* 104*  BUN 13 18  --  9 <5* 7  CREATININE 0.68 0.71  --  0.58 0.61 0.53  CALCIUM 10.0 10.0  --  9.0 9.9 9.4  MG  --  2.2  --  1.9 2.1 2.0  PHOS  --   --  2.8  --  3.3 2.9   GFR: Estimated Creatinine Clearance: 78.3 mL/min (by C-G formula based on SCr of 0.53 mg/dL). Liver Function Tests: Recent  Labs  Lab 04/08/22 0948 04/10/22 0722 04/11/22 0405 04/12/22 0729  AST 32 35 29 27  ALT 39 31 29 30   ALKPHOS 47 32* 35* 40  BILITOT 1.0 1.0 0.7 0.7  PROT 9.2* 6.5 7.0 7.3  ALBUMIN 5.3* 4.0 4.2 4.3   No results for input(s): LIPASE, AMYLASE in the last 168 hours. Recent Labs  Lab 04/11/22 0405  AMMONIA 29   Coagulation Profile: Recent Labs  Lab 04/10/22 0722  INR 1.1   Cardiac Enzymes: Recent Labs  Lab 04/09/22 1614 04/10/22 0722 04/11/22 0405  CKTOTAL 420* 274* 171   BNP (last 3 results) No results for input(s): PROBNP in the last 8760 hours. HbA1C: No results for input(s): HGBA1C in the last 72 hours. CBG: Recent Labs  Lab 04/08/22 0715  GLUCAP 153*   Lipid Profile: No results for input(s): CHOL, HDL, LDLCALC, TRIG, CHOLHDL, LDLDIRECT in the last 72 hours. Thyroid Function Tests: No results for input(s): TSH, T4TOTAL, FREET4, T3FREE, THYROIDAB in the last 72 hours. Anemia Panel: No results for input(s): VITAMINB12, FOLATE, FERRITIN, TIBC, IRON, RETICCTPCT in the last 72 hours. Sepsis Labs: No results for input(s): PROCALCITON, LATICACIDVEN in the last 168 hours.  Recent Results (from the past 240 hour(s))  Resp Panel by RT-PCR (Flu A&B, Covid) Anterior Nasal Swab     Status: None   Collection Time: 04/06/22  7:51 PM   Specimen: Anterior Nasal Swab  Result Value Ref Range Status   SARS Coronavirus 2 by RT PCR NEGATIVE NEGATIVE Final    Comment: (NOTE) SARS-CoV-2 target nucleic acids are NOT DETECTED.  The SARS-CoV-2 RNA is generally detectable in upper respiratory specimens during the acute phase of infection. The lowest concentration of SARS-CoV-2 viral copies this assay can detect is 138 copies/mL. A negative result does not preclude SARS-Cov-2 infection and should not be used as the sole basis for treatment or other patient management decisions. A negative result may occur with  improper specimen collection/handling, submission of specimen  other than nasopharyngeal swab, presence of viral mutation(s) within the areas targeted by this assay, and inadequate number of viral copies(<138 copies/mL). A negative result must be combined with clinical observations, patient history, and epidemiological information. The expected result is Negative.  Fact Sheet for Patients:  BloggerCourse.comhttps://www.fda.gov/media/152166/download  Fact Sheet for Healthcare Providers:  SeriousBroker.ithttps://www.fda.gov/media/152162/download  This test is no t yet approved or cleared by the [place] FDA and  has been authorized for detection and/or diagnosis of SARS-CoV-2 by FDA under an Emergency Use Authorization (EUA). This EUA will remain  in effect (meaning this test can be used) for the duration of the COVID-19 declaration under Section 564(b)(1) of the Act, 21 U.S.C.section 360bbb-3(b)(1), unless the authorization is terminated  or revoked sooner.       Influenza A by PCR NEGATIVE NEGATIVE Final   Influenza B by PCR NEGATIVE NEGATIVE Final    Comment: (NOTE) The Xpert Xpress SARS-CoV-2/FLU/RSV plus assay is intended as an aid in the diagnosis of influenza from Nasopharyngeal swab specimens and should not be used as a sole basis for treatment. Nasal washings and aspirates are unacceptable for Xpert Xpress SARS-CoV-2/FLU/RSV testing.  Fact Sheet for Patients: BloggerCourse.com  Fact Sheet for Healthcare Providers: SeriousBroker.it  This test is not yet approved or cleared by the [place] FDA and has been authorized for detection and/or diagnosis of SARS-CoV-2 by FDA under an Emergency Use Authorization (EUA). This EUA will remain in effect (meaning this test can be used) for the duration of the COVID-19 declaration under Section 564(b)(1) of the Act, 21 U.S.C. section 360bbb-3(b)(1), unless the authorization is terminated or revoked.  Performed at [facility], [address], [place], [place] [address]   SARS Coronavirus 2 by RT PCR (hospital order, performed in [facility] hospital lab) *cepheid single result test* Anterior Nasal Swab     Status: None   Collection Time: 04/08/22  7:45 AM   Specimen: Anterior Nasal Swab  Result Value Ref Range Status   SARS Coronavirus 2 by RT PCR NEGATIVE NEGATIVE Final    Comment: (NOTE) SARS-CoV-2 target nucleic acids are NOT DETECTED.  The SARS-CoV-2 RNA is generally detectable in upper and lower respiratory specimens during the acute phase of infection. The lowest concentration of SARS-CoV-2 viral copies this assay can detect is 250 copies / mL. A negative result does not preclude SARS-CoV-2 infection and should not be used as the sole basis for treatment or other patient management decisions.  A negative result may occur with improper specimen collection / handling, submission of specimen other than nasopharyngeal swab, presence of viral mutation(s) within the areas targeted by this assay, and inadequate number of viral copies (<250 copies / mL). A negative result must be combined with clinical observations, patient history, and epidemiological information.  Fact Sheet for Patients:   RoadLapTop.co.za  Fact Sheet for Healthcare Providers: http://kim-miller.com/  This test is not yet approved or  cleared by the [place] FDA and has been authorized for detection and/or diagnosis of SARS-CoV-2 by FDA under an Emergency Use Authorization (EUA).  This EUA will remain in effect (meaning this test can be used) for the duration of the COVID-19 declaration under Section 564(b)(1) of the Act, 21 U.S.C. section 360bbb-3(b)(1), unless the authorization is terminated or revoked sooner.  Performed at [facility], [address], [place], [place] [address]     Radiology Studies: No results found.  Scheduled Meds:  benztropine  0.5 mg Oral BID    docusate sodium  100 mg Oral BID   enoxaparin (LOVENOX) injection  40 mg Subcutaneous Q24H   haloperidol  10 mg Oral QHS   haloperidol  5 mg Oral QPC breakfast   lamoTRIgine  25 mg Oral QHS   lamoTRIgine  50 mg Oral Daily   liothyronine  25 mcg Oral Daily   propranolol  10 mg Oral BID   thiamine  100 mg Oral Daily   Continuous Infusions:   LOS: 3 days   Merlene Laughter, DO [facility] Available via Epic secure chat 7am-7pm After these hours, please refer to coverage provider listed on [email or web address] 04/13/2022, 11:46 AM

## 2022-04-13 NOTE — TOC Progression Note (Signed)
Transition of Care [facility]) - Progression Note    Patient Details  Name: Quenesha Douglass MRN: [id number] Date of Birth: 1990/12/19  Transition of Care [facility]) CM/SW Contact  Geni Bers, RN Phone Number: 04/13/2022, 2:57 PM  Clinical Narrative:    There are no bed offers for IP Psych. OV called to declined.         Expected Discharge Plan and Services                                                 Social Determinants of Health (SDOH) Interventions    Readmission Risk Interventions     View : No data to display.

## 2022-04-13 NOTE — Progress Notes (Signed)
Notified on call provider due to patient taking cardiac monitor off and refusing to put the cardiac monitor back on.

## 2022-04-13 NOTE — Plan of Care (Signed)

## 2022-04-14 ENCOUNTER — Inpatient Hospital Stay: Payer: Medicaid Other | Admitting: Physician Assistant

## 2022-04-14 DIAGNOSIS — F12959 Cannabis use, unspecified with psychotic disorder, unspecified: Secondary | ICD-10-CM

## 2022-04-14 LAB — METHYLMALONIC ACID, SERUM: Methylmalonic Acid, Quantitative: 94 nmol/L (ref 0–378)

## 2022-04-14 MED ORDER — LAMOTRIGINE 25 MG PO TABS
50.0000 mg | ORAL_TABLET | Freq: Two times a day (BID) | ORAL | Status: DC
  Administered 2022-04-14 – 2022-04-17 (×7): 50 mg via ORAL
  Filled 2022-04-14 (×7): qty 2

## 2022-04-14 MED ORDER — CLONAZEPAM 1 MG PO TABS
1.0000 mg | ORAL_TABLET | Freq: Two times a day (BID) | ORAL | Status: DC | PRN
  Administered 2022-04-14 – 2022-04-16 (×4): 1 mg via ORAL
  Filled 2022-04-14 (×4): qty 1

## 2022-04-14 NOTE — Consult Note (Signed)
[facility]   Reason for Consult: ''? patient on psych hold; h/o schizophrenia.'' Referring Physician:  Merlene LaughterSheikh Omair Latif, DO Patient Identification: Barbara Gordon MRN:  [id number] Principal Diagnosis: Cannabis use with psychotic disorder (HCC) Diagnosis:  Principal Problem:   Cannabis use with psychotic disorder (HCC) Active Problems:   Elevated CPK   Brief psychotic disorder (HCC)   Acute encephalopathy   Acute prerenal azotemia   Acquired hypothyroidism   Total Time spent with patient: 1 hour  Subjective:   Barbara Gordon is a 31 y.o. female patient admitted with altered mental status.  HPI:  Patient is a 31 year old Caucasian female with past medical history significant for Schizophrenia, substance abuse, substance induced psychosis, acquired hypothyroidism as well as other comorbidities who presented with altered mental status and acute encephalopathy after having worsening confusion [facility] while being on a psych hold via behavioral health. Patient reportedly became more confused, agitated, after her boyfriend visited her. As a result, behavioral health requested admission to the hospital service for further evaluation and management of acute encephalopathy. Patient also reported that her boyfriend had brought her a "drug from the street" but did not elaborate as to the specific drug nor class of medication that was involved.  Today on psychiatric reassessment, patient continues to remain unstable.  Patient presents with increased emotional dysregulation, psychosis, echolalia.  Upon entering the room patient observed to be crying, however no tears were present.  Patient was very tremulous, and displaying restlessness.  Patient continues to answer questions with a question.  She also is unable to appropriately identify the current president, stating" bin Marveen ReeksLaden" is the current president.  Discussed with patient the goal is to help  achieve psychiatric stability, then discharge home.  Patient then displaying flight of ideas, tangentiality, ongoing psychosis, and is now focused on discharging home.  Patient continues to have poor judgment and very limited insight, as is very difficult to discharge in these conditions.  Patient  has not eaten breakfast or lunch, citing poor appetite as the reason.  She states she is not sleeping well at this time, noting her anxiety is the primary reason for difficulty sleeping.  She is not psychiatrically stable, nor does she appear to be at baseline to discharge home today.  Will continue to meet inpatient psychiatric criteria.     Past Psychiatric History: as above  Risk to Self:  yes Risk to Others:  denies Prior Inpatient Therapy:   Prior Outpatient Therapy:  non-compliant  Past Medical History:  Past Medical History:  Diagnosis Date   Hypothyroidism    Polysubstance abuse (HCC)    Schizophrenia (HCC)    History reviewed. No pertinent surgical history. Family History: History reviewed. No pertinent family history. Family Psychiatric  History:  Social History:  Social History   Substance and Sexual Activity  Alcohol Use Not Currently     Social History   Substance and Sexual Activity  Drug Use Not Currently   Types: Cocaine, Amphetamines, Methamphetamines, Marijuana    Social History   Socioeconomic History   Marital status: Single    Spouse name: Not on file   Number of children: Not on file   Years of education: Not on file   Highest education level: Not on file  Occupational History   Not on file  Tobacco Use   Smoking status: Some Days    Types: Cigarettes   Smokeless tobacco: Never  Substance and Sexual Activity   Alcohol use:  Not Currently   Drug use: Not Currently    Types: Cocaine, Amphetamines, Methamphetamines, Marijuana   Sexual activity: Never  Other Topics Concern   Not on file  Social History Narrative   Not on file   Social Determinants of  Health   Financial Resource Strain: Not on file  Food Insecurity: Not on file  Transportation Needs: Not on file  Physical Activity: Not on file  Stress: Not on file  Social Connections: Not on file   Additional Social History:    Allergies:   Allergies  Allergen Reactions   Latex Itching and Rash   Tramadol Itching and Other (See Comments)    Provider: Crist Fat CFM - Allergy Description: TraMADol HCl *ANALGESICS - OPIOID* CFM - Allergy Annotation: Pruritus.     Labs:  No results found for this or any previous visit (from the past 48 hour(s)).   Current Facility-Administered Medications  Medication Dose Route Frequency Provider Last Rate Last Admin   acetaminophen (TYLENOL) tablet 650 mg  650 mg Oral Q6H PRN Howerter, Justin B, DO   650 mg at 04/14/22 0550   Or   acetaminophen (TYLENOL) suppository 650 mg  650 mg Rectal Q6H PRN Howerter, Justin B, DO       alum & mag hydroxide-simeth (MAALOX/MYLANTA) 200-200-20 MG/5ML suspension 30 mL  30 mL Oral Q6H PRN Terrilee Files, MD   30 mL at 04/14/22 1443   Asenapine Maleate (SAPHRIS) sublingual tablet 5 mg  5 mg Sublingual BID PRN Akintayo, Mojeed, MD   5 mg at 04/13/22 1033   benztropine (COGENTIN) tablet 0.5 mg  0.5 mg Oral BID Leevy-Johnson, Brooke A, NP   0.5 mg at 04/14/22 0937   docusate sodium (COLACE) capsule 100 mg  100 mg Oral BID Howerter, Justin B, DO   100 mg at 04/14/22 0937   enoxaparin (LOVENOX) injection 40 mg  40 mg Subcutaneous Q24H Howerter, Justin B, DO   40 mg at 04/14/22 4097   haloperidol (HALDOL) tablet 10 mg  10 mg Oral QHS Terrilee Files, MD   10 mg at 04/13/22 2058   haloperidol (HALDOL) tablet 5 mg  5 mg Oral QPC breakfast Thedore Mins, MD   5 mg at 04/14/22 3532   lamoTRIgine (LAMICTAL) tablet 25 mg  25 mg Oral QHS Akintayo, Mojeed, MD   25 mg at 04/13/22 2058   lamoTRIgine (LAMICTAL) tablet 50 mg  50 mg Oral Daily Akintayo, Mojeed, MD   50 mg at 04/14/22 0936   liothyronine (CYTOMEL)  tablet 25 mcg  25 mcg Oral Daily Terrilee Files, MD   25 mcg at 04/14/22 9924   lip balm (CARMEX) ointment   Topical PRN Marguerita Merles Latif, DO       LORazepam (ATIVAN) tablet 1 mg  1 mg Oral Q1H PRN Marguerita Merles Latif, DO   1 mg at 04/14/22 1443   nicotine polacrilex (NICORETTE) gum 2 mg  2 mg Oral PRN Terrilee Files, MD   2 mg at 04/13/22 1034   ondansetron (ZOFRAN) tablet 4 mg  4 mg Oral Q8H PRN Terrilee Files, MD       polyethylene glycol (MIRALAX / GLYCOLAX) packet 17 g  17 g Oral Daily PRN Howerter, Justin B, DO       propranolol (INDERAL) tablet 10 mg  10 mg Oral BID Leevy-Johnson, Brooke A, NP   10 mg at 04/14/22 2683   thiamine tablet 100 mg  100 mg Oral Daily Meridee Score  C, MD   100 mg at 04/14/22 4315    Musculoskeletal: Strength & Muscle Tone: within normal limits Gait & Station: normal Patient leans: N/A    Psychiatric Specialty Exam:  Presentation  General Appearance: Appropriate for Environment; Bizarre  Eye Contact:Minimal  Speech:Clear and Coherent; Slow  Speech Volume:Normal  Handedness:Right   Mood and Affect  Mood:Labile  Affect:Tearful; Labile; Full Range   Thought Process  Thought Processes:Irrevelant  Descriptions of Associations:Tangential  Orientation:Partial  Thought Content:Logical; Tangential; Scattered  History of Schizophrenia/Schizoaffective disorder:Yes  Duration of Psychotic Symptoms:Greater than six months  Hallucinations:Hallucinations: None  Ideas of Reference:None  Suicidal Thoughts:Suicidal Thoughts: No  Homicidal Thoughts:Homicidal Thoughts: No   Sensorium  Memory:Immediate Fair; Recent Fair; Remote Fair  Judgment:Poor  Insight:Poor   Executive Functions  Concentration:Poor  Attention Span:Poor  Recall:Fair  Fund of Knowledge:Fair  Language:Fair   Psychomotor Activity  Psychomotor Activity:Psychomotor Activity: Increased; Restlessness AIMS Completed?: No   Assets  Assets:Physical  Health; Desire for Improvement; Financial Resources/Insurance   Sleep  Sleep:Sleep: Poor   Physical Exam: Physical Exam Vitals and nursing note reviewed.  Constitutional:      Appearance: Normal appearance. She is normal weight.  Skin:    General: Skin is warm.     Capillary Refill: Capillary refill takes less than 2 seconds.  Neurological:     General: No focal deficit present.     Mental Status: She is alert and oriented to person, place, and time. Mental status is at baseline.  Psychiatric:        Attention and Perception: Attention and perception normal.        Mood and Affect: Mood is anxious. Affect is inappropriate.        Speech: Speech is tangential.        Behavior: Behavior is hyperactive.        Thought Content: Thought content normal.        Cognition and Memory: Cognition and memory normal.        Judgment: Judgment is impulsive.   Review of Systems  Psychiatric/Behavioral: Negative.    All other systems reviewed and are negative. Blood pressure (!) 122/93, pulse 84, temperature 97.6 F (36.4 C), temperature source Oral, resp. rate 16, height 5' 1.5" (1.562 m), weight 52.4 kg, SpO2 96 %. Body mass index is 21.47 kg/m.  Treatment Plan Summary: 31 year old female with history of mental illness and substance abuse who was admitted to the hospital for altered mental status. She remains easily frustrated, restless, confused, agitated, psychosis and suicidal. She is unable to contract for safety. She will benefit from inpatient psychiatric admission when she is medically stabilized.  Recommendations: -Continue 1:1 sitter for safety -Will discontinue lorazepam 1 mg and, switch to oral Klonopin 1mg  po BID.  -Continue Haldol 5 mg every morning; Haldol 10 mg p.o. nightly for agitation and psychosis. -Continue Benztropine 0.5 mg BID for EPS prevention -Continue Saphris 5 mg S/L twice daily as needed for agitation -Increase Lamictal to 50 mg p.o. twice daily for mood  stabilization.  Patient remains high risk for adverse drug event  -Consider social worker consult to facilitate inpatient psychiatric admission after pt is medically stabilized  Disposition: Recommend psychiatric Inpatient admission when medically cleared. Psychiatric service will follow the patient  , FNP 04/14/2022 3:08 PM

## 2022-04-14 NOTE — Progress Notes (Signed)
PROGRESS NOTE  Lashaina Tomer HEN:[id number] DOB: [birth year] DOA: 04/08/2022 PCP: Pcp, No   LOS: 4 days   Brief Narrative / Interim history: 31 year old female with schizophrenia, polysubstance abuse, hypothyroidism comes into the hospital with acute encephalopathy.  She was waiting on psych hold in the ED, and apparently received a visit from her boyfriend on 6/2 and became more confused, agitated, prompting [facility] to request medical admission to the hospital.  Subjective / 24h Interval events: Doing better, ambulating in the hallway.  Complains of some nonspecific abdominal pain  Assesement and Plan: Principal Problem:   Cannabis use with psychotic disorder (HCC) Active Problems:   Elevated CPK   Brief psychotic disorder (HCC)   Acute encephalopathy   Acute prerenal azotemia   Acquired hypothyroidism  Principal problem Acute toxic encephalopathy, unknown drug use-she had worsening confusion agitation while in psych hold after she took unspecified street drug from her boyfriend.  Now improved.  She has a history of polysubstance abuse including cannabis, vitamins, cocaine.  UDS was positive for benzodiazepines and THC on admission.  Overall improving, she is medically stable to discharge to inpatient psych  Active problems Metabolic acidosis-improved Mildly elevated CK level-improved Vaginal pain-nonspecific, UA unremarkable.  GC and Chlamydia negative Acquired Hypothyroidism-C/w Liothyronine 25 mcg po Daily  Schizophrenia -Being managed by behavioral health   Scheduled Meds:  benztropine  0.5 mg Oral BID   docusate sodium  100 mg Oral BID   enoxaparin (LOVENOX) injection  40 mg Subcutaneous Q24H   haloperidol  10 mg Oral QHS   haloperidol  5 mg Oral QPC breakfast   lamoTRIgine  25 mg Oral QHS   lamoTRIgine  50 mg Oral Daily   liothyronine  25 mcg Oral Daily   propranolol  10 mg Oral BID   thiamine  100 mg Oral Daily   Continuous Infusions: PRN Meds:.acetaminophen **OR**  acetaminophen, alum & mag hydroxide-simeth, Asenapine Maleate, lip balm, LORazepam, nicotine polacrilex, ondansetron, polyethylene glycol  Diet Orders (From admission, onward)     Start     Ordered   04/09/22 2013  Diet regular Room service appropriate? Yes; Fluid consistency: Thin  Diet effective now       Question Answer Comment  Room service appropriate? Yes   Fluid consistency: Thin      04/09/22 2012            DVT prophylaxis: enoxaparin (LOVENOX) injection 40 mg Start: 04/10/22 0900   Lab Results  Component Value Date   PLT 263 04/12/2022      Code Status: Full Code  Family Communication: no family at bedside   Status is: Inpatient  Remains inpatient appropriate because: waiting [facility] bed  Level of care: Progressive  Consultants:  Psychiatry   Objective: Vitals:   04/13/22 0633 04/13/22 1718 04/13/22 2054 04/14/22 0530  BP: 110/68 112/80 137/89 113/75  Pulse: 92 77 87 86  Resp: 16 18 16 18   Temp: 97.7 F (36.5 C) 97.8 F (36.6 C) (!) 97.4 F (36.3 C) 97.8 F (36.6 C)  TempSrc: Oral Oral Oral Oral  SpO2: 99% 98% 98% 98%  Weight: 53.9 kg   52.4 kg    Intake/Output Summary (Last 24 hours) at 04/14/2022 1126 Last data filed at 04/14/2022 0530 Gross per 24 hour  Intake 840 ml  Output --  Net 840 ml   Wt Readings from Last 3 Encounters:  04/14/22 52.4 kg  03/01/22 54.4 kg  02/26/22 54.4 kg    Examination:  Constitutional: NAD Eyes: no  scleral icterus ENMT: Mucous membranes are moist.  Neck: normal, supple Respiratory: clear to auscultation bilaterally, no wheezing, no crackles. N Cardiovascular: Regular rate and rhythm, no murmurs / rubs / gallops.  Abdomen: non distended, no tenderness. Bowel sounds positive.  Musculoskeletal: no clubbing / cyanosis.  Skin: no rashes Neurologic: non focal    Data Reviewed: I have independently reviewed following labs and imaging studies   CBC Recent Labs  Lab 04/08/22 0948 04/11/22 0405  04/12/22 0729  WBC 8.6 4.3 6.1  HGB 16.1* 12.4 13.0  HCT 45.9 35.3* 36.1  PLT 343 260 263  MCV 92.0 91.9 90.5  MCH 32.3 32.3 32.6  MCHC 35.1 35.1 36.0  RDW 12.4 11.9 11.8  LYMPHSABS 2.4 1.9 1.5  MONOABS 0.5 0.4 0.3  EOSABS 0.3 0.3 0.2  BASOSABS 0.1 0.0 0.0    Recent Labs  Lab 04/08/22 0948 04/09/22 1004 04/09/22 1614 04/10/22 0722 04/11/22 0405 04/12/22 0729  NA 138  --  138 137 142 141  K 4.4  --  3.9 4.0 3.7 3.7  CL 107  --  104 108 109 108  CO2 21*  --  22 21* 26 24  GLUCOSE 82  --  104* 108* 114* 104*  BUN 13  --  18 9 <5* 7  CREATININE 0.68  --  0.71 0.58 0.61 0.53  CALCIUM 10.0  --  10.0 9.0 9.9 9.4  AST 32  --   --  35 29 27  ALT 39  --   --  31 29 30   ALKPHOS 47  --   --  32* 35* 40  BILITOT 1.0  --   --  1.0 0.7 0.7  ALBUMIN 5.3*  --   --  4.0 4.2 4.3  MG  --   --  2.2 1.9 2.1 2.0  INR  --   --   --  1.1  --   --   TSH  --  0.074*  --   --   --   --   AMMONIA  --   --   --   --  29  --     ------------------------------------------------------------------------------------------------------------------ No results for input(s): CHOL, HDL, LDLCALC, TRIG, CHOLHDL, LDLDIRECT in the last 72 hours.  Lab Results  Component Value Date   HGBA1C 5.0 01/15/2022   ------------------------------------------------------------------------------------------------------------------ No results for input(s): TSH, T4TOTAL, T3FREE, THYROIDAB in the last 72 hours.  Invalid input(s): FREET3  Cardiac Enzymes No results for input(s): CKMB, TROPONINI, MYOGLOBIN in the last 168 hours.  Invalid input(s): CK ------------------------------------------------------------------------------------------------------------------ No results found for: BNP  CBG: Recent Labs  Lab 04/08/22 0715  GLUCAP 153*    Recent Results (from the past 240 hour(s))  Resp Panel by RT-PCR (Flu A&B, Covid) Anterior Nasal Swab     Status: None   Collection Time: 04/06/22  7:51 PM   Specimen:  Anterior Nasal Swab  Result Value Ref Range Status   SARS Coronavirus 2 by RT PCR NEGATIVE NEGATIVE Final    Comment: (NOTE) SARS-CoV-2 target nucleic acids are NOT DETECTED.  The SARS-CoV-2 RNA is generally detectable in upper respiratory specimens during the acute phase of infection. The lowest concentration of SARS-CoV-2 viral copies this assay can detect is 138 copies/mL. A negative result does not preclude SARS-Cov-2 infection and should not be used as the sole basis for treatment or other patient management decisions. A negative result may occur with  improper specimen collection/handling, submission of specimen other than nasopharyngeal swab, presence of viral  mutation(s) within the areas targeted by this assay, and inadequate number of viral copies(<138 copies/mL). A negative result must be combined with clinical observations, patient history, and epidemiological information. The expected result is Negative.  Fact Sheet for Patients:  EntrepreneurPulse.com.au  Fact Sheet for Healthcare Providers:  IncredibleEmployment.be  This test is no t yet approved or cleared by the [place] FDA and  has been authorized for detection and/or diagnosis of SARS-CoV-2 by FDA under an Emergency Use Authorization (EUA). This EUA will remain  in effect (meaning this test can be used) for the duration of the COVID-19 declaration under Section 564(b)(1) of the Act, 21 U.S.C.section 360bbb-3(b)(1), unless the authorization is terminated  or revoked sooner.       Influenza A by PCR NEGATIVE NEGATIVE Final   Influenza B by PCR NEGATIVE NEGATIVE Final    Comment: (NOTE) The Xpert Xpress SARS-CoV-2/FLU/RSV plus assay is intended as an aid in the diagnosis of influenza from Nasopharyngeal swab specimens and should not be used as a sole basis for treatment. Nasal washings and aspirates are unacceptable for Xpert Xpress SARS-CoV-2/FLU/RSV testing.  Fact  Sheet for Patients: EntrepreneurPulse.com.au  Fact Sheet for Healthcare Providers: IncredibleEmployment.be  This test is not yet approved or cleared by the [place] FDA and has been authorized for detection and/or diagnosis of SARS-CoV-2 by FDA under an Emergency Use Authorization (EUA). This EUA will remain in effect (meaning this test can be used) for the duration of the COVID-19 declaration under Section 564(b)(1) of the Act, 21 U.S.C. section 360bbb-3(b)(1), unless the authorization is terminated or revoked.  Performed at [facility], [place] [address], [place], [place] [address]   SARS Coronavirus 2 by RT PCR (hospital order, performed in [facility] hospital lab) *cepheid single result test* Anterior Nasal Swab     Status: None   Collection Time: 04/08/22  7:45 AM   Specimen: Anterior Nasal Swab  Result Value Ref Range Status   SARS Coronavirus 2 by RT PCR NEGATIVE NEGATIVE Final    Comment: (NOTE) SARS-CoV-2 target nucleic acids are NOT DETECTED.  The SARS-CoV-2 RNA is generally detectable in upper and lower respiratory specimens during the acute phase of infection. The lowest concentration of SARS-CoV-2 viral copies this assay can detect is 250 copies / mL. A negative result does not preclude SARS-CoV-2 infection and should not be used as the sole basis for treatment or other patient management decisions.  A negative result may occur with improper specimen collection / handling, submission of specimen other than nasopharyngeal swab, presence of viral mutation(s) within the areas targeted by this assay, and inadequate number of viral copies (<250 copies / mL). A negative result must be combined with clinical observations, patient history, and epidemiological information.  Fact Sheet for Patients:   https://www.patel.info/  Fact Sheet for Healthcare  Providers: https://hall.com/  This test is not yet approved or  cleared by the [place] FDA and has been authorized for detection and/or diagnosis of SARS-CoV-2 by FDA under an Emergency Use Authorization (EUA).  This EUA will remain in effect (meaning this test can be used) for the duration of the COVID-19 declaration under Section 564(b)(1) of the Act, 21 U.S.C. section 360bbb-3(b)(1), unless the authorization is terminated or revoked sooner.  Performed at [facility], [place] [address], [place], [place] [address]      Radiology Studies: No results found.   Marzetta Board, MD, PhD [facility]  Between 7 am - 7 pm I am available,  please contact me via Amion (for emergencies) or Securechat (non urgent messages)  Between 7 pm - 7 am I am not available, please contact night coverage MD/APP via Amion

## 2022-04-14 NOTE — Plan of Care (Signed)
  Problem: Activity: Goal: Risk for activity intolerance will decrease Outcome: Progressing   Problem: Coping: Goal: Level of anxiety will decrease Outcome: Progressing   Problem: Pain Managment: Goal: General experience of comfort will improve Outcome: Progressing   Problem: Safety: Goal: Ability to remain free from injury will improve Outcome: Progressing   Problem: Skin Integrity: Goal: Risk for impaired skin integrity will decrease Outcome: Progressing   

## 2022-04-15 MED ORDER — DIPHENHYDRAMINE HCL 50 MG/ML IJ SOLN
50.0000 mg | Freq: Once | INTRAMUSCULAR | Status: DC
  Filled 2022-04-15: qty 1

## 2022-04-15 MED ORDER — HALOPERIDOL LACTATE 5 MG/ML IJ SOLN
5.0000 mg | Freq: Once | INTRAMUSCULAR | Status: DC
Start: 2022-04-15 — End: 2022-04-15
  Filled 2022-04-15: qty 1

## 2022-04-15 MED ORDER — DIPHENHYDRAMINE HCL 50 MG/ML IJ SOLN
50.0000 mg | Freq: Once | INTRAMUSCULAR | Status: AC
  Administered 2022-04-15: 50 mg via INTRAMUSCULAR

## 2022-04-15 MED ORDER — HALOPERIDOL LACTATE 5 MG/ML IJ SOLN
5.0000 mg | Freq: Once | INTRAMUSCULAR | Status: AC
  Administered 2022-04-15: 5 mg via INTRAMUSCULAR

## 2022-04-15 NOTE — Progress Notes (Incomplete)
Patient having increased anxiety, agitation, and hallucinations throughout the day.  Patient frequently has hallucinations about her son and how "evil people are trying to kill me", "they're killing him", "they're eating him". Frequently asking nursing staff "don't you see they are hurting him?".  Auditory hallucinations also present saying she can hear people calling her name and calling her "stupid b****". Paranoia stating that people are laughing at her "for being raped".   At 1715 patient evaded PSA and went into restroom, started banging her head against the doorframe and saying "I want to kill myself". Patient inconsolably crying.  Diversion techniques attempted, no change in behavior presentation.  PRN medications given throughout the day, psych NP and attending physician notified about worsening condition at 1330 and 1730.  Only 1 PRN medication remains for duration of day.

## 2022-04-15 NOTE — TOC Progression Note (Signed)
Transition of Care [facility]) - Progression Note    Patient Details  Name: Shaydee Cieslik MRN: [id number] Date of Birth: [birth year]  Transition of Care [facility]) CM/SW Contact  Leeroy Cha, RN Phone Number: 04/15/2022, 8:39 AM  Clinical Narrative:    No bed offers for psych placement, has no insurance.  Will keep looking for bed placement.        Expected Discharge Plan and Services                                                 Social Determinants of Health (SDOH) Interventions    Readmission Risk Interventions     No data to display

## 2022-04-15 NOTE — Progress Notes (Signed)
As reported by sitter, "Pt is hallucinating seeing a dead body and blood everywhere. Pt is claiming I (the sitter) am a child molester for allowing her son to cry and do nothing about it. When I inquired about her son she said he is at home with his grandmother and is complaining of pain and requesting pain medication"....   Later on sitter reports "She (the Pt) is alternating talking to her mother and me. telling me her mother is stuck in a car bloody, and then telling me to dig a grave for the coffin and has asked me multiple times "how do you get a dead bitch to stop calling you?" She isn't necessarily agitated, but is definitely more confused than when the shift started"  Rn assessed pt who said she felt like her heart was racing due to her anxiety. Rn and sitter at bedside talking to pt, attempted to do an EKG but was incomplete as pt stated repeatedly she just wanted to sleep and be left alone.  All external stimuli decreased in room. Sitter remains at bedside, pt appears to have calm down considerably  Will continue to monitor and address concerns PRN in this shift

## 2022-04-15 NOTE — Progress Notes (Signed)
PROGRESS NOTE  Barbara Gordon MWU:[id number] DOB: 1991-05-11 DOA: 04/08/2022 PCP: Pcp, No   LOS: 5 days   Brief Narrative / Interim history: 31 year old female with schizophrenia, polysubstance abuse, hypothyroidism comes into the hospital with acute encephalopathy.  She was waiting on psych hold in the ED, and apparently received a visit from her boyfriend on 6/2 and became more confused, agitated, prompting [facility] to request medical admission to the hospital.  Subjective / 24h Interval events: No complaints.  Slept most morning  Assesement and Plan: Principal Problem:   Cannabis use with psychotic disorder (HCC) Active Problems:   Elevated CPK   Brief psychotic disorder (HCC)   Acute encephalopathy   Acute prerenal azotemia   Acquired hypothyroidism  Principal problem Acute toxic encephalopathy, unknown drug use-she had worsening confusion agitation while in psych hold after she took unspecified street drug from her boyfriend.  Now improved.  She has a history of polysubstance abuse including cannabis, vitamins, cocaine.  UDS was positive for benzodiazepines and THC on admission.  Overall improving, remains medically stable to discharge to inpatient psych Active problems Metabolic acidosis-improved Mildly elevated CK level-improved Vaginal pain-nonspecific, UA unremarkable.  GC and Chlamydia negative Acquired Hypothyroidism-C/w Liothyronine 25 mcg po Daily  Schizophrenia -Being managed by behavioral health   Scheduled Meds:  benztropine  0.5 mg Oral BID   docusate sodium  100 mg Oral BID   enoxaparin (LOVENOX) injection  40 mg Subcutaneous Q24H   haloperidol  10 mg Oral QHS   haloperidol  5 mg Oral QPC breakfast   lamoTRIgine  50 mg Oral BID   liothyronine  25 mcg Oral Daily   propranolol  10 mg Oral BID   thiamine  100 mg Oral Daily   Continuous Infusions: PRN Meds:.acetaminophen **OR** acetaminophen, alum & mag hydroxide-simeth, Asenapine Maleate, clonazePAM, lip balm,  nicotine polacrilex, ondansetron, polyethylene glycol  Diet Orders (From admission, onward)     Start     Ordered   04/09/22 2013  Diet regular Room service appropriate? Yes; Fluid consistency: Thin  Diet effective now       Question Answer Comment  Room service appropriate? Yes   Fluid consistency: Thin      04/09/22 2012            DVT prophylaxis: enoxaparin (LOVENOX) injection 40 mg Start: 04/10/22 0900   Lab Results  Component Value Date   PLT 263 04/12/2022      Code Status: Full Code  Family Communication: no family at bedside   Status is: Inpatient  Remains inpatient appropriate because: waiting [facility] bed  Level of care: Progressive  Consultants:  Psychiatry   Objective: Vitals:   04/14/22 1200 04/14/22 1254 04/14/22 2100 04/14/22 2226  BP: (!) 127/99 (!) 122/93 106/67 (!) 126/92  Pulse: 86 84 75   Resp:  16    Temp:  97.6 F (36.4 C) 97.7 F (36.5 C)   TempSrc:  Oral Oral   SpO2:  96% 98%   Weight:      Height: 5' 1.5" (1.562 m)       Intake/Output Summary (Last 24 hours) at 04/15/2022 1047 Last data filed at 04/15/2022 0850 Gross per 24 hour  Intake 727 ml  Output --  Net 727 ml    Wt Readings from Last 3 Encounters:  04/14/22 52.4 kg  03/01/22 54.4 kg  02/26/22 54.4 kg    Examination:  Constitutional: No distress   Data Reviewed: I have independently reviewed following labs and imaging studies  CBC Recent Labs  Lab 04/11/22 0405 04/12/22 0729  WBC 4.3 6.1  HGB 12.4 13.0  HCT 35.3* 36.1  PLT 260 263  MCV 91.9 90.5  MCH 32.3 32.6  MCHC 35.1 36.0  RDW 11.9 11.8  LYMPHSABS 1.9 1.5  MONOABS 0.4 0.3  EOSABS 0.3 0.2  BASOSABS 0.0 0.0     Recent Labs  Lab 04/09/22 1004 04/09/22 1614 04/10/22 0722 04/11/22 0405 04/12/22 0729  NA  --  138 137 142 141  K  --  3.9 4.0 3.7 3.7  CL  --  104 108 109 108  CO2  --  22 21* 26 24  GLUCOSE  --  104* 108* 114* 104*  BUN  --  18 9 <5* 7  CREATININE  --  0.71 0.58 0.61 0.53   CALCIUM  --  10.0 9.0 9.9 9.4  AST  --   --  35 29 27  ALT  --   --  31 29 30   ALKPHOS  --   --  32* 35* 40  BILITOT  --   --  1.0 0.7 0.7  ALBUMIN  --   --  4.0 4.2 4.3  MG  --  2.2 1.9 2.1 2.0  INR  --   --  1.1  --   --   TSH 0.074*  --   --   --   --   AMMONIA  --   --   --  29  --      ------------------------------------------------------------------------------------------------------------------ No results for input(s): "CHOL", "HDL", "LDLCALC", "TRIG", "CHOLHDL", "LDLDIRECT" in the last 72 hours.  Lab Results  Component Value Date   HGBA1C 5.0 01/15/2022   ------------------------------------------------------------------------------------------------------------------ No results for input(s): "TSH", "T4TOTAL", "T3FREE", "THYROIDAB" in the last 72 hours.  Invalid input(s): "FREET3"  Cardiac Enzymes No results for input(s): "CKMB", "TROPONINI", "MYOGLOBIN" in the last 168 hours.  Invalid input(s): "CK" ------------------------------------------------------------------------------------------------------------------ No results found for: "BNP"  CBG: No results for input(s): "GLUCAP" in the last 168 hours.   Recent Results (from the past 240 hour(s))  Resp Panel by RT-PCR (Flu A&B, Covid) Anterior Nasal Swab     Status: None   Collection Time: 04/06/22  7:51 PM   Specimen: Anterior Nasal Swab  Result Value Ref Range Status   SARS Coronavirus 2 by RT PCR NEGATIVE NEGATIVE Final    Comment: (NOTE) SARS-CoV-2 target nucleic acids are NOT DETECTED.  The SARS-CoV-2 RNA is generally detectable in upper respiratory specimens during the acute phase of infection. The lowest concentration of SARS-CoV-2 viral copies this assay can detect is 138 copies/mL. A negative result does not preclude SARS-Cov-2 infection and should not be used as the sole basis for treatment or other patient management decisions. A negative result may occur with  improper specimen  collection/handling, submission of specimen other than nasopharyngeal swab, presence of viral mutation(s) within the areas targeted by this assay, and inadequate number of viral copies(<138 copies/mL). A negative result must be combined with clinical observations, patient history, and epidemiological information. The expected result is Negative.  Fact Sheet for Patients:  BloggerCourse.comhttps://www.fda.gov/media/152166/download  Fact Sheet for Healthcare Providers:  SeriousBroker.ithttps://www.fda.gov/media/152162/download  This test is no t yet approved or cleared by the [place] FDA and  has been authorized for detection and/or diagnosis of SARS-CoV-2 by FDA under an Emergency Use Authorization (EUA). This EUA will remain  in effect (meaning this test can be used) for the duration of the COVID-19 declaration under Section 564(b)(1) of the Act,  21 U.S.C.section 360bbb-3(b)(1), unless the authorization is terminated  or revoked sooner.       Influenza A by PCR NEGATIVE NEGATIVE Final   Influenza B by PCR NEGATIVE NEGATIVE Final    Comment: (NOTE) The Xpert Xpress SARS-CoV-2/FLU/RSV plus assay is intended as an aid in the diagnosis of influenza from Nasopharyngeal swab specimens and should not be used as a sole basis for treatment. Nasal washings and aspirates are unacceptable for Xpert Xpress SARS-CoV-2/FLU/RSV testing.  Fact Sheet for Patients: BloggerCourse.com  Fact Sheet for Healthcare Providers: SeriousBroker.it  This test is not yet approved or cleared by the [place] FDA and has been authorized for detection and/or diagnosis of SARS-CoV-2 by FDA under an Emergency Use Authorization (EUA). This EUA will remain in effect (meaning this test can be used) for the duration of the COVID-19 declaration under Section 564(b)(1) of the Act, 21 U.S.C. section 360bbb-3(b)(1), unless the authorization is terminated or revoked.  Performed at [facility], [address], [place], [place] [address]   SARS Coronavirus 2 by RT PCR (hospital order, performed in [facility] hospital lab) *cepheid single result test* Anterior Nasal Swab     Status: None   Collection Time: 04/08/22  7:45 AM   Specimen: Anterior Nasal Swab  Result Value Ref Range Status   SARS Coronavirus 2 by RT PCR NEGATIVE NEGATIVE Final    Comment: (NOTE) SARS-CoV-2 target nucleic acids are NOT DETECTED.  The SARS-CoV-2 RNA is generally detectable in upper and lower respiratory specimens during the acute phase of infection. The lowest concentration of SARS-CoV-2 viral copies this assay can detect is 250 copies / mL. A negative result does not preclude SARS-CoV-2 infection and should not be used as the sole basis for treatment or other patient management decisions.  A negative result may occur with improper specimen collection / handling, submission of specimen other than nasopharyngeal swab, presence of viral mutation(s) within the areas targeted by this assay, and inadequate number of viral copies (<250 copies / mL). A negative result must be combined with clinical observations, patient history, and epidemiological information.  Fact Sheet for Patients:   RoadLapTop.co.za  Fact Sheet for Healthcare Providers: http://kim-miller.com/  This test is not yet approved or  cleared by the [place] FDA and has been authorized for detection and/or diagnosis of SARS-CoV-2 by FDA under an Emergency Use Authorization (EUA).  This EUA will remain in effect (meaning this test can be used) for the duration of the COVID-19 declaration under Section 564(b)(1) of the Act, 21 U.S.C. section 360bbb-3(b)(1), unless the authorization is terminated or revoked sooner.  Performed at [facility], [address], [place], [place] [address]      Radiology Studies: No results found.   Pamella Pert, MD, PhD [facility]  Between 7 am - 7 pm I am available, please contact me via Amion (for emergencies) or Securechat (non urgent messages)  Between 7 pm - 7 am I am not available, please contact night coverage MD/APP via Amion

## 2022-04-15 NOTE — Consult Note (Signed)
[facility] Face-to-Face Psychiatry Consult   Reason for Consult: ''? patient on psych hold; h/o schizophrenia.'' Referring Physician:  Merlene Laughter, DO Patient Identification: Barbara Gordon MRN:  [id number] Principal Diagnosis: Cannabis use with psychotic disorder (HCC) Diagnosis:  Principal Problem:   Cannabis use with psychotic disorder (HCC) Active Problems:   Elevated CPK   Brief psychotic disorder (HCC)   Acute encephalopathy   Acute prerenal azotemia   Acquired hypothyroidism   Total Time spent with patient: 1 hour  Subjective:   Barbara Gordon is a 31 y.o. female patient admitted with altered mental status.  HPI:  Patient is a 31 year old Caucasian female with past medical history significant for Schizophrenia, substance abuse, substance induced psychosis, acquired hypothyroidism as well as other comorbidities who presented with altered mental status and acute encephalopathy after having worsening confusion in the [facility] emergency department while being on a psych hold via behavioral health. Patient reportedly became more confused, agitated, after her boyfriend visited her. As a result, behavioral health requested admission to the hospital service for further evaluation and management of acute encephalopathy. Patient also reported that her boyfriend had brought her a "drug from the street" but did not elaborate as to the specific drug nor class of medication that was involved.   Patient seen and reassessed, case discussed with treatment team.Patient's progress remains slow, however some improvement with recent medication adjustments.  Patient does continue to present with broad spectrum of cognitive dysfunction, attention impairment, easily frustration, and significant psychosis.  In addition to echolalia, patient continues to present with visual hallucinations, delusions.  Patient states she is in a significant amount of pain, when asking about her pain she reports her arm, elbow,  stomach, and then states is painful because people are eating me like cannibal.  Made all attempts to strategically avoid using trigger words for patient on this reassessment to include discharge, home, inpatient psychiatry unit.  Patient continues to endorse poor sleeping, remains very restless on exam.  Patient continues to report compliance with Haldol, denies any acute side effects at this time.  She continues to remain very tremulous, when sitting up.  This is also reported by safety sitter, who notes tremors when walking patient.  Safety sitter also endorses patient having tearful episodes, unconsolable crying, and ongoing delusions to include references of cannibal.  Patient continues to meet inpatient psychiatric criteria, and does not appear to be at baseline to discharge home today.   Past Psychiatric History: as above  Risk to Self:  yes Risk to Others:  denies Prior Inpatient Therapy:   Prior Outpatient Therapy:  non-compliant  Past Medical History:  Past Medical History:  Diagnosis Date   Hypothyroidism    Polysubstance abuse (HCC)    Schizophrenia (HCC)    History reviewed. No pertinent surgical history. Family History: History reviewed. No pertinent family history. Family Psychiatric  History:  Social History:  Social History   Substance and Sexual Activity  Alcohol Use Not Currently     Social History   Substance and Sexual Activity  Drug Use Not Currently   Types: Cocaine, Amphetamines, Methamphetamines, Marijuana    Social History   Socioeconomic History   Marital status: Single    Spouse name: Not on file   Number of children: Not on file   Years of education: Not on file   Highest education level: Not on file  Occupational History   Not on file  Tobacco Use   Smoking status: Some Days    Types:  Cigarettes   Smokeless tobacco: Never  Substance and Sexual Activity   Alcohol use: Not Currently   Drug use: Not Currently    Types: Cocaine, Amphetamines,  Methamphetamines, Marijuana   Sexual activity: Never  Other Topics Concern   Not on file  Social History Narrative   Not on file   Social Determinants of Health   Financial Resource Strain: Not on file  Food Insecurity: Not on file  Transportation Needs: Not on file  Physical Activity: Not on file  Stress: Not on file  Social Connections: Not on file   Additional Social History:    Allergies:   Allergies  Allergen Reactions   Latex Itching and Rash   Tramadol Itching and Other (See Comments)    Provider: Crist Fat CFM - Allergy Description: TraMADol HCl *ANALGESICS - OPIOID* CFM - Allergy Annotation: Pruritus.     Labs:  No results found for this or any previous visit (from the past 48 hour(s)).   Current Facility-Administered Medications  Medication Dose Route Frequency Provider Last Rate Last Admin   acetaminophen (TYLENOL) tablet 650 mg  650 mg Oral Q6H PRN Howerter, Justin B, DO   650 mg at 04/15/22 0801   Or   acetaminophen (TYLENOL) suppository 650 mg  650 mg Rectal Q6H PRN Howerter, Justin B, DO       alum & mag hydroxide-simeth (MAALOX/MYLANTA) 200-200-20 MG/5ML suspension 30 mL  30 mL Oral Q6H PRN Terrilee Files, MD   30 mL at 04/15/22 0802   asenapine (SAPHRIS) sublingual tablet 5 mg  5 mg Sublingual BID PRN Akintayo, Mojeed, MD   5 mg at 04/15/22 1327   benztropine (COGENTIN) tablet 0.5 mg  0.5 mg Oral BID Leevy-Johnson, Brooke A, NP   0.5 mg at 04/15/22 0801   clonazePAM (KLONOPIN) tablet 1 mg  1 mg Oral BID PRN Maryagnes Amos, FNP   1 mg at 04/15/22 1201   docusate sodium (COLACE) capsule 100 mg  100 mg Oral BID Howerter, Justin B, DO   100 mg at 04/15/22 0801   enoxaparin (LOVENOX) injection 40 mg  40 mg Subcutaneous Q24H Howerter, Justin B, DO   40 mg at 04/15/22 0802   haloperidol (HALDOL) tablet 10 mg  10 mg Oral QHS Terrilee Files, MD   10 mg at 04/14/22 2121   haloperidol (HALDOL) tablet 5 mg  5 mg Oral QPC breakfast Akintayo, Mojeed,  MD   5 mg at 04/15/22 0803   lamoTRIgine (LAMICTAL) tablet 50 mg  50 mg Oral BID Maryagnes Amos, FNP   50 mg at 04/15/22 0801   liothyronine (CYTOMEL) tablet 25 mcg  25 mcg Oral Daily Terrilee Files, MD   25 mcg at 04/15/22 1201   lip balm (CARMEX) ointment   Topical PRN Marguerita Merles Latif, DO       nicotine polacrilex (NICORETTE) gum 2 mg  2 mg Oral PRN Terrilee Files, MD   2 mg at 04/15/22 0804   ondansetron (ZOFRAN) tablet 4 mg  4 mg Oral Q8H PRN Terrilee Files, MD   4 mg at 04/14/22 1711   polyethylene glycol (MIRALAX / GLYCOLAX) packet 17 g  17 g Oral Daily PRN Howerter, Justin B, DO       propranolol (INDERAL) tablet 10 mg  10 mg Oral BID Leevy-Johnson, Brooke A, NP   10 mg at 04/15/22 0801   thiamine tablet 100 mg  100 mg Oral Daily Terrilee Files, MD  100 mg at 04/15/22 0801    Musculoskeletal: Strength & Muscle Tone: within normal limits Gait & Station: normal Patient leans: N/A    Psychiatric Specialty Exam:  Presentation  General Appearance: Bizarre; Disheveled  Eye Contact:Fair  Speech:Clear and Coherent; Slow  Speech Volume:Decreased  Handedness:Right   Mood and Affect  Mood:Labile  Affect:Tearful; Full Range; Inappropriate   Thought Process  Thought Processes:Irrevelant  Descriptions of Associations:Loose  Orientation:Partial  Thought Content:Logical; Tangential  History of Schizophrenia/Schizoaffective disorder:Yes  Duration of Psychotic Symptoms:Greater than six months  Hallucinations:Hallucinations: Visual Description of Visual Hallucinations: people are eating me like a cannibal  Ideas of Reference:Delusions  Suicidal Thoughts:Suicidal Thoughts: No  Homicidal Thoughts:Homicidal Thoughts: No   Sensorium  Memory:Recent Fair; Immediate Poor; Remote Fair  Judgment:Poor  Insight:Lacking   Executive Functions  Concentration:Poor  Attention Span:Poor  Recall:Poor  Fund of  Knowledge:Fair  Language:Fair   Psychomotor Activity  Psychomotor Activity:Psychomotor Activity: Restlessness AIMS Completed?: Yes   Assets  Assets:Desire for Improvement; Financial Resources/Insurance; Physical Health   Sleep  Sleep:Sleep: Poor   Physical Exam: Physical Exam Vitals and nursing note reviewed.  Constitutional:      Appearance: Normal appearance. She is normal weight.  Skin:    General: Skin is warm.     Capillary Refill: Capillary refill takes less than 2 seconds.  Neurological:     General: No focal deficit present.     Mental Status: She is alert and oriented to person, place, and time. Mental status is at baseline.  Psychiatric:        Attention and Perception: Attention and perception normal.        Mood and Affect: Mood is anxious. Affect is inappropriate.        Speech: Speech is tangential.        Behavior: Behavior is hyperactive.        Thought Content: Thought content normal.        Cognition and Memory: Cognition and memory normal.        Judgment: Judgment is impulsive.    Review of Systems  Psychiatric/Behavioral: Negative.    All other systems reviewed and are negative.  Blood pressure 129/85, pulse 83, temperature 97.6 F (36.4 C), temperature source Oral, resp. rate 18, height 5' 1.5" (1.562 m), weight 52.4 kg, SpO2 97 %. Body mass index is 21.47 kg/m.  Treatment Plan Summary: 31 year old female with history of mental illness and substance abuse who was admitted to the hospital for altered mental status.  Patient with slow and mild improvement to psychotropic medication.  She continues to require multiple as needed medications for stabilization, reduction and psychiatric symptoms, and treatment. Patient who is displaying symptoms consistent with hyperthyroidism and underlying psychiatric condition.  Suspect her slow response to traditional antipsychotics, may be delayed due to hyperthyroidism.  Will continue to recommend inpatient  psychiatric admission, as patient will benefit from daily assessments, closely monitoring side effects of medication, and psychiatric stabilization.  Recommendations: -Continue 1:1 sitter for safety -Will continue oral Klonopin  po BID.  -Continue Haldol 5 mg every morning; Haldol 10 mg p.o. nightly for agitation and psychosis. -Continue Benztropine 0.5 mg BID for EPS prevention -Continue Saphris 5 mg S/L twice daily as needed for agitation -Continue Lamictal 50 mg p.o. twice daily for mood stabilization.  Patient remains high risk for adverse drug event  -TSH consistent with hyperthyroidism  0.074 (last admission TSH was 0.204).Recommend appropriate management of hypothyroidism as it can also contribute to her mental slowness,  irritability, emotional lability, fatigue.  -Will order B12.   -Chart review shows low vitamin D, likely not treated due to patient inability to remain compliant.  Will defer vitamin D treatment to medical team.  which can also contribute to her worsening neuropsychiatric presentation (depression, psychosis, and delirium).  -Will obtain repeat EKG, last EKG obtained 1 week ago QTc of 462.  As previously noted do not recommend initiation of any additional antipsychotics at this time as patient has risk for worsening prolonged QTc.  -Continue to recommend safety sitter, and limit use of restraints in this patient.  -Patient remains under involuntary commitment at this time, current IVC expires on June 9.  We will recommend renewing IVC patient continues to be a danger to self at this time. -Consider social worker consult to facilitate inpatient psychiatric admission after pt is medically stabilized  Disposition: Recommend psychiatric Inpatient admission when medically cleared. Psychiatric service will follow the patient  Maryagnes Amosakia S Starkes-Perry, FNP 04/15/2022 2:15 PM

## 2022-04-16 LAB — CORTISOL: Cortisol, Plasma: 6.3 ug/dL

## 2022-04-16 MED ORDER — OLANZAPINE 5 MG PO TBDP
5.0000 mg | ORAL_TABLET | Freq: Every day | ORAL | Status: DC
  Administered 2022-04-16 – 2022-04-17 (×2): 5 mg via ORAL
  Filled 2022-04-16 (×2): qty 1

## 2022-04-16 MED ORDER — CLONAZEPAM 1 MG PO TABS
1.0000 mg | ORAL_TABLET | Freq: Two times a day (BID) | ORAL | Status: DC
  Administered 2022-04-16 – 2022-04-17 (×3): 1 mg via ORAL
  Filled 2022-04-16 (×3): qty 1

## 2022-04-16 MED ORDER — HALOPERIDOL 5 MG PO TABS
5.0000 mg | ORAL_TABLET | Freq: Two times a day (BID) | ORAL | Status: DC
  Administered 2022-04-16 – 2022-04-17 (×3): 5 mg via ORAL
  Filled 2022-04-16 (×3): qty 1

## 2022-04-16 MED ORDER — VITAMIN D (ERGOCALCIFEROL) 1.25 MG (50000 UNIT) PO CAPS
50000.0000 [IU] | ORAL_CAPSULE | ORAL | Status: DC
  Administered 2022-04-16: 50000 [IU] via ORAL
  Filled 2022-04-16 (×2): qty 1

## 2022-04-16 NOTE — Consult Note (Signed)
[facility] Face-to-Face Psychiatry Consult   Reason for Consult: ''? 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 female patient admitted with altered mental status.  HPI:  Patient is a 31 year old Caucasian female with past medical history significant for Schizophrenia, substance abuse, substance induced psychosis, acquired hypothyroidism as well as other comorbidities who presented with altered mental status and acute encephalopathy after having worsening confusion in the [facility] emergency department while being on a psych hold via behavioral health. Patient reportedly became more confused, agitated, after her boyfriend visited her. As a result, behavioral health requested admission to the hospital service for further evaluation and management of acute encephalopathy. Patient also reported that her boyfriend had brought her a "drug from the street" but did not elaborate as to the specific drug nor class of medication that was involved.   Patient seen and attempted to reassess, case discussed with Dr. Lovette Cliche, Dr. Dwyane Dee and primary team.  Patient is observed to be sleeping after receiving a as needed dose of Klonopin 1 hour before attempting to assess patient.  Patient clinically does not appear to be progressing at this time despite standing dose of Haldol, and as needed dose of Saphris.  Patient continues to remain delusional, responding to internal and external stimuli, psychosis, and paranoia.  According to nursing staff patient  continues to have vague complaints of pain, cannibalism, unconsolable crying, and insomnia.   After further discussion with all members of team, will place neuro consult to rule out any further causes of organic psychosis.  Please see note from Dr. Cruzita Lederer, well documented history of abnormal MRI, acquired hypothyroidism, and low prolactin.  All of which has been addressed at this time by primary team, suspect underlying psychiatric condition is primary cause for her current clinical presentation.  Patient will have complete neurology work-up to include lumbar puncture, prior to being accepted to inpatient psychiatric unit.  Past Psychiatric History: as above  Risk to Self:  yes Risk to Others:  denies Prior Inpatient Therapy:   Prior Outpatient Therapy:  non-compliant  Past Medical History:  Past Medical History:  Diagnosis Date   Hypothyroidism    Polysubstance abuse ([place])    Schizophrenia ([place])    History reviewed. No pertinent surgical history. Family History: History reviewed. No pertinent family history. Family Psychiatric  History:  Social History:  Social History   Substance and Sexual Activity  Alcohol Use Not Currently     Social History   Substance and Sexual Activity  Drug Use Not Currently   Types: Cocaine, Amphetamines, Methamphetamines, Marijuana    Social History   Socioeconomic History   Marital status: Single    Spouse name: Not on file   Number of children: Not on file   Years of education: Not on file   Highest education level: Not on file  Occupational History   Not on file  Tobacco Use   Smoking status: Some Days    Types: Cigarettes   Smokeless tobacco: Never  Substance and Sexual Activity   Alcohol use: Not Currently   Drug  use: Not Currently    Types: Cocaine, Amphetamines, Methamphetamines, Marijuana   Sexual activity: Never  Other Topics Concern   Not on file  Social History Narrative   Not on file   Social Determinants of Health    Financial Resource Strain: Not on file  Food Insecurity: Not on file  Transportation Needs: Not on file  Physical Activity: Not on file  Stress: Not on file  Social Connections: Not on file   Additional Social History:    Allergies:   Allergies  Allergen Reactions   Latex Itching and Rash   Tramadol Itching and Other (See Comments)    Provider: Marijean Bravo CFM - Allergy Description: TraMADol HCl *ANALGESICS - OPIOID* CFM - Allergy Annotation: Pruritus.     Labs:  Results for orders placed or performed during the hospital encounter of 04/08/22 (from the past 48 hour(s))  Cortisol     Status: None   Collection Time: 04/16/22  8:43 AM  Result Value Ref Range   Cortisol, Plasma 6.3 ug/dL    Comment: (NOTE) AM    6.7 - 22.6 ug/dL PM   <10.0       ug/dL Performed at [facility] [address] [place], [place] [address]      Current Facility-Administered Medications  Medication Dose Route Frequency Provider Last Rate Last Admin   acetaminophen (TYLENOL) tablet 650 mg  650 mg Oral Q6H PRN Howerter, Justin B, DO   650 mg at 04/16/22 [id number]   Or   acetaminophen (TYLENOL) suppository 650 mg  650 mg Rectal Q6H PRN Howerter, Justin B, DO       alum & mag hydroxide-simeth (MAALOX/MYLANTA) 200-200-20 MG/5ML suspension 30 mL  30 mL Oral Q6H PRN Hayden Rasmussen, MD   30 mL at 04/15/22 2247   benztropine (COGENTIN) tablet 0.5 mg  0.5 mg Oral BID Leevy-Johnson, Brooke A, NP   0.5 mg at 04/16/22 0921   clonazePAM (KLONOPIN) tablet 1 mg  1 mg Oral BID PRN Suella Broad, FNP   1 mg at 04/16/22 1151   docusate sodium (COLACE) capsule 100 mg  100 mg Oral BID Howerter, Justin B, DO   100 mg at 04/16/22 0919   enoxaparin (LOVENOX) injection 40 mg  40 mg Subcutaneous Q24H Howerter, Justin B, DO   40 mg at 04/16/22 0919   haloperidol (HALDOL) tablet 5 mg  5 mg Oral BID Suella Broad, FNP       lamoTRIgine (LAMICTAL) tablet 50 mg  50 mg Oral BID Suella Broad, FNP   50 mg at 04/16/22 0919   lip balm (CARMEX) ointment   Topical PRN Raiford Noble Latif, DO       nicotine polacrilex (NICORETTE) gum 2 mg  2 mg Oral PRN Hayden Rasmussen, MD   2 mg at 04/15/22 0804   OLANZapine zydis (ZYPREXA) disintegrating tablet 5 mg  5 mg Oral QHS Suella Broad, FNP       ondansetron (ZOFRAN) tablet 4 mg  4 mg Oral Q8H PRN Hayden Rasmussen, MD   4 mg at 04/14/22 1711   polyethylene glycol (MIRALAX / GLYCOLAX) packet 17 g  17 g Oral Daily PRN Howerter, Justin B, DO       propranolol (INDERAL) tablet 10 mg  10 mg Oral BID Leevy-Johnson, Brooke A, NP   10 mg at 04/16/22 [id number]   thiamine tablet 100 mg  100 mg Oral Daily Hayden Rasmussen, MD   100  mg at 04/16/22 0919    Musculoskeletal: Strength & Muscle Tone: within normal limits Gait & Station: normal Patient leans: N/A    Psychiatric Specialty Exam: Deferred due to patient sleeping .   Physical Exam: Physical Exam Vitals and nursing note reviewed.  Constitutional:      Appearance: Normal appearance. She is normal weight.  Skin:    General: Skin is warm.     Capillary Refill: Capillary refill takes less than 2 seconds.  Neurological:     General: No focal deficit present.     Mental Status: She is alert and oriented to person, place, and time. Mental status is at baseline.  Psychiatric:        Attention and Perception: Attention and perception normal.        Mood and Affect: Mood is anxious. Affect is inappropriate.        Speech: Speech is tangential.        Behavior: Behavior is hyperactive.        Thought Content: Thought content normal.        Cognition and Memory: Cognition and memory normal.        Judgment: Judgment is impulsive.    Review of Systems  Psychiatric/Behavioral: Negative.    All other systems reviewed and are negative.  Blood pressure (!) 148/98, pulse 96, temperature 97.6 F (36.4 C), temperature source Oral, resp. rate 17, height 5' 1.5" (1.562 m), weight 52.4  kg, SpO2 98 %. Body mass index is 21.47 kg/m.  Treatment Plan Summary: 31 year old female with history of mental illness and substance abuse who was admitted to the hospital for altered mental status.  Patient with slow and mild improvement to psychotropic medication.  She continues to require multiple as needed medications for stabilization, reduction and psychiatric symptoms, and treatment. Patient who is displaying symptoms consistent with hyperthyroidism and underlying psychiatric condition.  Suspect her slow response to traditional antipsychotics, may be delayed due to hyperthyroidism.  Will continue to recommend inpatient psychiatric admission, as patient will benefit from daily assessments, closely monitoring side effects of medication, and psychiatric stabilization.  Recommendations: -Continue 1:1 sitter for safety -Will continue oral Klonopin 1mg  po BID.  Patient Has not responded to Haldol 15 mg daily, will add olanzapine Zydis 5 mg p.o. nightly to further target psychosis, delusions, and paranoia.    And because patient has not been able to achieve symptom control on the antipsychotic monotherapy, she will be treated with 2 separate antipsychotic medications Haldol and olanzapine Zydis.  He will benefit patient to continue this combination therapy as recommended, until symptoms improve.  Once symptoms have improved and or been reduce, patient may be titrated to monotherapy at the discretion and proper judgment of her inpatient provider.  Previous antipsychotic medication trials have included Haldol, risperidone, olanzapine (with some response, however resulted in over sedation was discontinued); if additional work-up is determined to be negative patient may be a good candidate for clozapine.  -Decrease Haldol 5 mg twice daily; -Continue Benztropine 0.5 mg BID for EPS prevention -DC Saphris 5 mg S/L twice daily as needed for agitation -Continue Lamictal 50 mg p.o. twice daily for mood  stabilization.  Patient remains high risk for adverse drug event  -TSH consistent with hyperthyroidism  0.074 (last admission TSH was 0.204).Cytomel was discontinued. -Will order B12.   -Chart review shows low vitamin D, likely not treated due to patient inability to remain compliant.  Will defer vitamin D treatment to medical team.  which  can also contribute to her worsening neuropsychiatric presentation (depression, psychosis, and delirium).  -Repeat EKG obtained on June 8, QTc of 453. -Patient remains under involuntary commitment at this time, current IVC expires on June 9.  We will recommend renewing IVC patient continues to be a danger to self at this time. -Consider social worker consult to facilitate inpatient psychiatric admission after pt is medically stabilized.  Patient is under review at [facility], pending neuro work-up.  Disposition: Recommend psychiatric Inpatient admission when medically cleared. Psychiatric service will follow the patient  Suella Broad, FNP 04/16/2022 2:19 PM

## 2022-04-16 NOTE — Progress Notes (Signed)
PROGRESS NOTE  Barbara Gordon WUJ:[id number] DOB: 1991-03-09 DOA: 04/08/2022 PCP: Pcp, No   LOS: 6 days   Brief Narrative / Interim history: 31 year old female with schizophrenia, polysubstance abuse, hypothyroidism comes into the hospital with acute encephalopathy.  She was waiting on psych hold in the ED, and apparently received a visit from her boyfriend on 6/2 and became more confused, agitated, prompting [facility] to request medical admission to the hospital.  Subjective / 24h Interval events: No specific complaints.  Agitation worsened last night  Assesement and Plan: Principal Problem:   Cannabis use with psychotic disorder ([facility]) Active Problems:   Elevated CPK   Brief psychotic disorder ([facility])   Acute encephalopathy   Acute prerenal azotemia   Acquired hypothyroidism  Principal problem Acute toxic encephalopathy, unknown drug use-she had worsening confusion agitation while in psych hold after she took unspecified street drug from her boyfriend.  Still requiring medications and one-to-one sitter.  She has a history of polysubstance abuse including cannabis, vitamins, cocaine.  UDS was positive for benzodiazepines and THC on admission.  Remains medically ready for discharge to inpatient psych Active problems Metabolic acidosis-improved Mildly elevated CK level-improved Vaginal pain-nonspecific, UA unremarkable.  GC and Chlamydia negative Hypothyroidism, ruled out-chart review in terms related to this diagnosis reveals normal TSH in the past up until March 2023, when she was admitted at [facility], and her TSH was found to be boderline low at 0.2, with normal free T3 and T4 levels, suggesting sick euthyroid syndrome versus, less likely, secondary hypothyroidism, but no other work-up that I can see was pursued.  She was placed on thyroid supplementation with Cytomel, unclear to me as to why.  TSH looks a little bit more suppressed now, free T3 elevated, which is expected given Cytomel. Stop thyroid  supplementations as she is not established to be hypothyroid, primary or secondary.  Recheck TSH, free T3 and free T4 in 3 to 4 weeks Low prolactin-unclear significance, will screen for pituitary issues with ACTH, cortisol, repeat prolactin level.  If abnormal may benefit from outpatient endocrine referral but does not particularly need further inpatient work-up at this point nor this is related to her underlying psych problems Abnormal MRI March 2023-MRI done in March 2023 when she was admitted to [facility] was concerning for small areas of increased T2/flair signal in the right temporal and further context, nonspecific.  Images discussed this morning with Dr. Amada Jupiter, he feels like this is more likely related to TBI and not necessarily causing her psychiatric illness which has been preceding her MRI for more than a decade.  Psychiatry requests a formal neurology consult today prior to accepting her to [facility] given lack of response to standard psychiatric treatment.  Neurology will see this afternoon.  Appreciate input. Schizophrenia -Being managed by behavioral health   Scheduled Meds:  benztropine  0.5 mg Oral BID   docusate sodium  100 mg Oral BID   enoxaparin (LOVENOX) injection  40 mg Subcutaneous Q24H   haloperidol  10 mg Oral QHS   haloperidol  5 mg Oral QPC breakfast   lamoTRIgine  50 mg Oral BID   propranolol  10 mg Oral BID   thiamine  100 mg Oral Daily   Continuous Infusions: PRN Meds:.acetaminophen **OR** acetaminophen, alum & mag hydroxide-simeth, asenapine, clonazePAM, lip balm, nicotine polacrilex, ondansetron, polyethylene glycol  Diet Orders (From admission, onward)     Start     Ordered   04/09/22 2013  Diet regular Room service appropriate? Yes; Fluid consistency: Thin  Diet effective now       Question Answer Comment  Room service appropriate? Yes   Fluid consistency: Thin      04/09/22 2012            DVT prophylaxis: enoxaparin (LOVENOX) injection 40 mg Start:  04/10/22 0900   Lab Results  Component Value Date   PLT 263 04/12/2022      Code Status: Full Code  Family Communication: no family at bedside   Status is: Inpatient  Remains inpatient appropriate because: waiting [facility] bed  Level of care: Progressive  Consultants:  Psychiatry   Objective: Vitals:   04/15/22 2148 04/15/22 2149 04/16/22 0627 04/16/22 0921  BP: 109/76 109/76 120/81 (!) 148/98  Pulse: 80 79 85 96  Resp:  19 17   Temp:  97.9 F (36.6 C) 97.6 F (36.4 C)   TempSrc:  Oral Oral   SpO2:  98% 98%   Weight:      Height:        Intake/Output Summary (Last 24 hours) at 04/16/2022 1325 Last data filed at 04/16/2022 0849 Gross per 24 hour  Intake 120 ml  Output --  Net 120 ml    Wt Readings from Last 3 Encounters:  04/14/22 52.4 kg  03/01/22 54.4 kg  02/26/22 54.4 kg    Examination:  Constitutional: NAD Eyes: lids and conjunctivae normal, no scleral icterus ENMT: mmm Neck: normal, supple Respiratory: clear to auscultation bilaterally, no wheezing, no crackles.  Cardiovascular: Regular rate and rhythm, no murmurs / rubs / gallops.  Abdomen: soft, no distention, no tenderness. Bowel sounds positive.  Skin: no rashes Neurologic: no focal deficits   Data Reviewed: I have independently reviewed following labs and imaging studies   CBC Recent Labs  Lab 04/11/22 0405 04/12/22 0729  WBC 4.3 6.1  HGB 12.4 13.0  HCT 35.3* 36.1  PLT 260 263  MCV 91.9 90.5  MCH 32.3 32.6  MCHC 35.1 36.0  RDW 11.9 11.8  LYMPHSABS 1.9 1.5  MONOABS 0.4 0.3  EOSABS 0.3 0.2  BASOSABS 0.0 0.0     Recent Labs  Lab 04/09/22 1614 04/10/22 0722 04/11/22 0405 04/12/22 0729  NA 138 137 142 141  K 3.9 4.0 3.7 3.7  CL 104 108 109 108  CO2 22 21* 26 24  GLUCOSE 104* 108* 114* 104*  BUN 18 9 <5* 7  CREATININE 0.71 0.58 0.61 0.53  CALCIUM 10.0 9.0 9.9 9.4  AST  --  35 29 27  ALT  --  31 29 30   ALKPHOS  --  32* 35* 40  BILITOT  --  1.0 0.7 0.7  ALBUMIN  --  4.0  4.2 4.3  MG 2.2 1.9 2.1 2.0  INR  --  1.1  --   --   AMMONIA  --   --  29  --      ------------------------------------------------------------------------------------------------------------------ No results for input(s): "CHOL", "HDL", "LDLCALC", "TRIG", "CHOLHDL", "LDLDIRECT" in the last 72 hours.  Lab Results  Component Value Date   HGBA1C 5.0 01/15/2022   ------------------------------------------------------------------------------------------------------------------ No results for input(s): "TSH", "T4TOTAL", "T3FREE", "THYROIDAB" in the last 72 hours.  Invalid input(s): "FREET3"  Cardiac Enzymes No results for input(s): "CKMB", "TROPONINI", "MYOGLOBIN" in the last 168 hours.  Invalid input(s): "CK" ------------------------------------------------------------------------------------------------------------------ No results found for: "BNP"  CBG: No results for input(s): "GLUCAP" in the last 168 hours.   Recent Results (from the past 240 hour(s))  Resp Panel by RT-PCR (Flu A&B, Covid) Anterior Nasal Swab  Status: None   Collection Time: 04/06/22  7:51 PM   Specimen: Anterior Nasal Swab  Result Value Ref Range Status   SARS Coronavirus 2 by RT PCR NEGATIVE NEGATIVE Final    Comment: (NOTE) SARS-CoV-2 target nucleic acids are NOT DETECTED.  The SARS-CoV-2 RNA is generally detectable in upper respiratory specimens during the acute phase of infection. The lowest concentration of SARS-CoV-2 viral copies this assay can detect is 138 copies/mL. A negative result does not preclude SARS-Cov-2 infection and should not be used as the sole basis for treatment or other patient management decisions. A negative result may occur with  improper specimen collection/handling, submission of specimen other than nasopharyngeal swab, presence of viral mutation(s) within the areas targeted by this assay, and inadequate number of viral copies(<138 copies/mL). A negative result must be  combined with clinical observations, patient history, and epidemiological information. The expected result is Negative.  Fact Sheet for Patients:  BloggerCourse.comhttps://www.fda.gov/media/152166/download  Fact Sheet for Healthcare Providers:  SeriousBroker.ithttps://www.fda.gov/media/152162/download  This test is no t yet approved or cleared by the [place] FDA and  has been authorized for detection and/or diagnosis of SARS-CoV-2 by FDA under an Emergency Use Authorization (EUA). This EUA will remain  in effect (meaning this test can be used) for the duration of the COVID-19 declaration under Section 564(b)(1) of the Act, 21 U.S.C.section 360bbb-3(b)(1), unless the authorization is terminated  or revoked sooner.       Influenza A by PCR NEGATIVE NEGATIVE Final   Influenza B by PCR NEGATIVE NEGATIVE Final    Comment: (NOTE) The Xpert Xpress SARS-CoV-2/FLU/RSV plus assay is intended as an aid in the diagnosis of influenza from Nasopharyngeal swab specimens and should not be used as a sole basis for treatment. Nasal washings and aspirates are unacceptable for Xpert Xpress SARS-CoV-2/FLU/RSV testing.  Fact Sheet for Patients: BloggerCourse.comhttps://www.fda.gov/media/152166/download  Fact Sheet for Healthcare Providers: SeriousBroker.ithttps://www.fda.gov/media/152162/download  This test is not yet approved or cleared by the [place] FDA and has been authorized for detection and/or diagnosis of SARS-CoV-2 by FDA under an Emergency Use Authorization (EUA). This EUA will remain in effect (meaning this test can be used) for the duration of the COVID-19 declaration under Section 564(b)(1) of the Act, 21 U.S.C. section 360bbb-3(b)(1), unless the authorization is terminated or revoked.  Performed at [facility], [address] [place], [place] [address]   SARS Coronavirus 2 by RT PCR (hospital order, performed in [facility] hospital lab) *cepheid single result test* Anterior Nasal Swab     Status: None    Collection Time: 04/08/22  7:45 AM   Specimen: Anterior Nasal Swab  Result Value Ref Range Status   SARS Coronavirus 2 by RT PCR NEGATIVE NEGATIVE Final    Comment: (NOTE) SARS-CoV-2 target nucleic acids are NOT DETECTED.  The SARS-CoV-2 RNA is generally detectable in upper and lower respiratory specimens during the acute phase of infection. The lowest concentration of SARS-CoV-2 viral copies this assay can detect is 250 copies / mL. A negative result does not preclude SARS-CoV-2 infection and should not be used as the sole basis for treatment or other patient management decisions.  A negative result may occur with improper specimen collection / handling, submission of specimen other than nasopharyngeal swab, presence of viral mutation(s) within the areas targeted by this assay, and inadequate number of viral copies (<250 copies / mL). A negative result must be combined with clinical observations, patient history, and epidemiological information.  Fact Sheet for Patients:   RoadLapTop.co.zahttps://www.fda.gov/media/158405/download  Fact Sheet for Healthcare Providers: http://kim-miller.com/  This test is not yet approved or  cleared by the [place] FDA and has been authorized for detection and/or diagnosis of SARS-CoV-2 by FDA under an Emergency Use Authorization (EUA).  This EUA will remain in effect (meaning this test can be used) for the duration of the COVID-19 declaration under Section 564(b)(1) of the Act, 21 U.S.C. section 360bbb-3(b)(1), unless the authorization is terminated or revoked sooner.  Performed at [facility], [address] [place], [place] [address]      Radiology Studies: No results found.   Pamella Pert, MD, PhD [facility]  Between 7 am - 7 pm I am available, please contact me via Amion (for emergencies) or Securechat (non urgent messages)  Between 7 pm - 7 am I am not available, please contact night coverage  MD/APP via Amion

## 2022-04-16 NOTE — Consult Note (Addendum)
Neurology Consultation Reason for Consult: Abnormal MRI Referring Physician: Lafe Garin, C  CC: " Want to go home"  History is obtained from: Patient, chart review  HPI: Barbara Gordon is a 31 y.o. 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  She states that in 2021 she attempted to kill herself and as a result of this had a traumatic brain injury.  On review of care everywhere, she had a head CT which demonstrates an epidural hematoma over the left convexity.  Due to a seizure an MRI was performed and March of this year which revealed some small areas T2 change in the left temporal and parietal regions.  Of note, there is associated hemosiderin staining associated with one of these.  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   Past Medical History:  Diagnosis Date   Hypothyroidism    Polysubstance abuse (HCC)    Schizophrenia (HCC)      History reviewed. No pertinent family history.   Social History:  reports that she has been smoking cigarettes. She has never used smokeless tobacco. She reports that she does not currently use alcohol. She reports that she does not currently use drugs after having used the following drugs: Cocaine, Amphetamines, Methamphetamines, and Marijuana.   Exam: Current vital signs: BP (!) 148/98   Pulse 96   Temp 97.6 F (36.4 C) (Oral)   Resp 17   Ht 5' 1.5" (1.562 m)   Wt 52.4 kg   SpO2 98%   BMI 21.47 kg/m  Vital signs in last 24 hours: Temp:  [97.6 F (36.4 C)-97.9 F (36.6 C)] 97.6 F (36.4 C) (06/09 0627) Pulse Rate:  [79-96] 96 (06/09 0921) Resp:  [17-19] 17 (06/09 0627) BP: (109-148)/(76-98) 148/98 (06/09 0921) SpO2:  [98 %] 98 % (06/09 3716)   Physical Exam  Constitutional: Appears well-developed and well-nourished.  Psych: Affect  appropriate to situation Eyes: No scleral injection HENT: No OP obstruction MSK: no joint deformities.  Cardiovascular: Normal rate and regular rhythm.  Respiratory: Effort normal, non-labored breathing GI: Soft.  No distension. There is no tenderness.  Skin: WDI  Neuro: Mental Status: Patient is awake, alert, oriented to person, place, month, year, and situation. Patient is able to give a clear and coherent history. No signs of aphasia or neglect She has difficulty with number of quarters in $2.75 and with world backwards Cranial Nerves: II: Visual Fields are full. Pupils are equal, round, and reactive to light.   III,IV, VI: EOMI without ptosis or diploplia.  V: Facial sensation is symmetric to temperature VII: Facial movement is symmetric.  VIII: hearing is intact to voice X: Uvula elevates symmetrically XI: Shoulder shrug is symmetric. XII: tongue is midline without atrophy or fasciculations.  Motor: Tone is normal. Bulk is normal. 5/5 strength was present in all four extremities.  She has an enhanced physiologic tremor Sensory: Sensation is symmetric to light touch and temperature in the arms and legs. Cerebellar: FNF intact bilaterally   I have reviewed labs in epic and the results pertinent to this consultation are: CMP-unremarkable  I have reviewed the images obtained: MRI brain-small areas of T2 , predominantly on the left, and areas that are similar to what was previously described with her abnormal CT head from 2021.  I feel that these changes are more consistent with sequela of TBI.  With autoimmune  encephalitis, I would expect more symmetric changes.  Impression: 31 year old female with a longstanding history of psychiatric disease.  I agree with optimizing medical condition such as stopping unnecessary thyroid treatment as she does have an enhanced physiologic tremor which can go along with overtreatment.  I do not feel that autoimmune encephalitis is evidenced by  her MRI, but the sequela seen on it could be a source of seizure.  She has been started on Lamictal, and I do think that she would benefit from antiepileptic therapy given history of seizure with cortical lesion.  I would continue titrating Lamictal towards a goal of 100 mg twice daily.  Recommendations: 1) agree with Lamictal, I would continue titrating towards a goal of 100 mg twice daily.  Next step would be 75 mg twice daily, she increased her dose on 6/7, so would not increase before 6/14, then again waiting at least a week before increasing to 100 mg twice daily.  Hold if any rash develops. 2) I do not feel that LP would be at all likely to give further insight 3) agree with screening for other pituitary abnormalities. 4) no other acute neurodiagnostic testing is recommended at this time, please call with further question or concerns   Ritta Slot, MD [facility] [phone]  If 7pm- 7am, please page neurology on call as listed in AMION.

## 2022-04-16 NOTE — Plan of Care (Signed)
  Problem: Clinical Measurements: Goal: Diagnostic test results will improve Outcome: Progressing   Problem: Safety: Goal: Ability to remain free from injury will improve Outcome: Progressing   Problem: Coping: Goal: Level of anxiety will decrease Outcome: Not Progressing

## 2022-04-17 ENCOUNTER — Inpatient Hospital Stay (HOSPITAL_COMMUNITY)
Admission: AD | Admit: 2022-04-17 | Discharge: 2022-04-22 | DRG: 885 | Disposition: A | Discharge status: Home / Self Care | Payer: Federal, State, Local not specified - Other | Source: Intra-hospital | Attending: Psychiatry | Admitting: Psychiatry

## 2022-04-17 DIAGNOSIS — F200 Paranoid schizophrenia: Secondary | ICD-10-CM | POA: Diagnosis present

## 2022-04-17 DIAGNOSIS — F419 Anxiety disorder, unspecified: Secondary | ICD-10-CM | POA: Diagnosis present

## 2022-04-17 DIAGNOSIS — F17210 Nicotine dependence, cigarettes, uncomplicated: Secondary | ICD-10-CM | POA: Diagnosis present

## 2022-04-17 DIAGNOSIS — Z20822 Contact with and (suspected) exposure to covid-19: Secondary | ICD-10-CM | POA: Diagnosis present

## 2022-04-17 DIAGNOSIS — R410 Disorientation, unspecified: Secondary | ICD-10-CM | POA: Diagnosis present

## 2022-04-17 DIAGNOSIS — F209 Schizophrenia, unspecified: Principal | ICD-10-CM | POA: Diagnosis present

## 2022-04-17 DIAGNOSIS — Z87820 Personal history of traumatic brain injury: Secondary | ICD-10-CM

## 2022-04-17 LAB — SARS CORONAVIRUS 2 BY RT PCR: SARS Coronavirus 2 by RT PCR: NEGATIVE

## 2022-04-17 LAB — ACTH: C206 ACTH: 12.2 pg/mL (ref 7.2–63.3)

## 2022-04-17 LAB — PROLACTIN: Prolactin: 189 ng/mL — ABNORMAL HIGH (ref 4.8–23.3)

## 2022-04-17 NOTE — Progress Notes (Signed)
Pt was accepted to [facility] 04/17/22 PENDING Negative COVID-19  CSW is awaiting IVC paperwork via fax to [phone].   Pt meets inpatient criteria per Phineas Inches, MD  Attending Physician will be Phineas Inches, MD  Report can be called to: -Adult unit: [phone]  Care Team: [facility], RN, Phineas Inches, MD, Ardean Larsen, MD.   Kelton Pillar, LCSWA 04/17/2022 @ 12:07 PM

## 2022-04-17 NOTE — Progress Notes (Signed)
PROGRESS NOTE    Barbara Gordon  [id number] DOB: 02/13/1991 DOA: 04/08/2022 PCP: Pcp, No   Brief Narrative:  31 year old female with schizophrenia, polysubstance abuse, hypothyroidism comes into the hospital with acute encephalopathy.  She was waiting on psych hold in the ED, and apparently received a visit from her boyfriend on 6/2 and became more confused, agitated, prompting [place] to request medical admission to the hospital.  Assessment & Plan:  Acute toxic encephalopathy: -she had worsening confusion agitation while in psych hold after she took unspecified street drug from her boyfriend.  Still requiring medications and one-to-one sitter.   -UDS was positive for benzodiazepines and THC on admission.   -Remains medically ready for discharge to inpatient psych  Hypothyroidism, ruled out -chart review in terms related to this diagnosis reveals normal TSH in the past up until March 2023, when she was admitted at [facility], and her TSH was found to be boderline low at 0.2, with normal free T3 and T4 levels, suggesting sick euthyroid syndrome versus, less likely, secondary hypothyroidism, but no other work-up that I can see was pursued.  She was placed on thyroid supplementation with Cytomel, TSH looks a little bit more suppressed now, free T3 elevated, which is expected given Cytomel.  -Stop thyroid supplementations as she is not established to be hypothyroid, primary or secondary.  Recheck TSH, free T3 and free T4 in 3 to 4 weeks  Elevated Prolactin:  -Could be secondary to antipsychotic medications  Abnormal MRI March 2023 -MRI done in March 2023 when she was admitted to [facility] was concerning for small areas of increased T2/flair signal in the right temporal and further context, nonspecific.   -Neurology consulted thinks that MRI changes consistent with sequela of TBI.  Recommend optimize medical conditions such as stopping unnecessary thyroid treatment.  Continue Lamictal and titrating towards  a goal of 100 mg twice daily.  Neurology do not feel LP necessary at this time.  No other acute neurodiagnostic testing is recommended -Appreciate neurology consult  Metabolic acidosis-Resolved  Mildly elevated CK level-resolved  Vaginal pain-nonspecific, UA unremarkable.  GC and Chlamydia negative  Schizophrenia -Being managed by behavioral health Patient needs inpatient psych admission and will be transferred to [facility] once bed is available.   DVT prophylaxis: Lovenox Code Status: Full code Family Communication:  None present at bedside.  Plan of care discussed with patient in length and she verbalized understanding and agreed with it. Disposition Plan: [facility]  Consultants:  Neurology Psychiatry  Procedures:  None  Antimicrobials:  None  Status is: Inpatient    Subjective: Patient seen and examined.  Sitting comfortably on the bed and eating breakfast.  Sitter at the bedside.  Denies any complaints.  No acute events overnight.  Objective: Vitals:   04/16/22 0921 04/16/22 2247 04/17/22 0450 04/17/22 0938  BP: (!) 148/98 118/71 118/81 (!) 136/95  Pulse: 96 72 75 91  Resp:  18 20   Temp:  98.3 F (36.8 C) 98.1 F (36.7 C)   TempSrc:  Oral Oral   SpO2:  98% 99%   Weight:      Height:        Intake/Output Summary (Last 24 hours) at 04/17/2022 1202 Last data filed at 04/17/2022 [id number] Gross per 24 hour  Intake 100 ml  Output --  Net 100 ml   Filed Weights   04/12/22 0500 04/13/22 0633 04/14/22 0530  Weight: 53.7 kg 53.9 kg 52.4 kg    Examination:  General exam: Appears calm and comfortable,  eating breakfast Respiratory system: Clear to auscultation. Respiratory effort normal. Cardiovascular system: S1 & S2 heard, RRR. No JVD, murmurs, rubs, gallops or clicks. No pedal edema. Gastrointestinal system: Abdomen is nondistended, soft and nontender. No organomegaly or masses felt. Normal bowel sounds heard. Central nervous system: Alert and oriented. No focal  neurological deficits. Extremities: Symmetric 5 x 5 power. Skin: No rashes, lesions or ulcers Psychiatry: Flat affect   Data Reviewed: I have personally reviewed following labs and imaging studies  CBC: Recent Labs  Lab 04/11/22 0405 04/12/22 0729  WBC 4.3 6.1  NEUTROABS 1.7 4.0  HGB 12.4 13.0  HCT 35.3* 36.1  MCV 91.9 90.5  PLT 260 [id number]   Basic Metabolic Panel: Recent Labs  Lab 04/11/22 0405 04/12/22 0729  NA 142 141  K 3.7 3.7  CL 109 108  CO2 26 24  GLUCOSE 114* 104*  BUN <5* 7  CREATININE 0.61 0.53  CALCIUM 9.9 9.4  MG 2.1 2.0  PHOS 3.3 2.9   GFR: Estimated Creatinine Clearance: 80.3 mL/min (by C-G formula based on SCr of 0.53 mg/dL). Liver Function Tests: Recent Labs  Lab 04/11/22 0405 04/12/22 0729  AST 29 27  ALT 29 30  ALKPHOS 35* 40  BILITOT 0.7 0.7  PROT 7.0 7.3  ALBUMIN 4.2 4.3   No results for input(s): "LIPASE", "AMYLASE" in the last 168 hours. Recent Labs  Lab 04/11/22 0405  AMMONIA 29   Coagulation Profile: No results for input(s): "INR", "PROTIME" in the last 168 hours. Cardiac Enzymes: Recent Labs  Lab 04/11/22 0405  CKTOTAL 171   BNP (last 3 results) No results for input(s): "PROBNP" in the last 8760 hours. HbA1C: No results for input(s): "HGBA1C" in the last 72 hours. CBG: No results for input(s): "GLUCAP" in the last 168 hours. Lipid Profile: No results for input(s): "CHOL", "HDL", "LDLCALC", "TRIG", "CHOLHDL", "LDLDIRECT" in the last 72 hours. Thyroid Function Tests: No results for input(s): "TSH", "T4TOTAL", "FREET4", "T3FREE", "THYROIDAB" in the last 72 hours. Anemia Panel: No results for input(s): "VITAMINB12", "FOLATE", "FERRITIN", "TIBC", "IRON", "RETICCTPCT" in the last 72 hours. Sepsis Labs: No results for input(s): "PROCALCITON", "LATICACIDVEN" in the last 168 hours.  Recent Results (from the past 240 hour(s))  SARS Coronavirus 2 by RT PCR (hospital order, performed in [facility] hospital lab) *cepheid single  result test* Anterior Nasal Swab     Status: None   Collection Time: 04/08/22  7:45 AM   Specimen: Anterior Nasal Swab  Result Value Ref Range Status   SARS Coronavirus 2 by RT PCR NEGATIVE NEGATIVE Final    Comment: (NOTE) SARS-CoV-2 target nucleic acids are NOT DETECTED.  The SARS-CoV-2 RNA is generally detectable in upper and lower respiratory specimens during the acute phase of infection. The lowest concentration of SARS-CoV-2 viral copies this assay can detect is 250 copies / mL. A negative result does not preclude SARS-CoV-2 infection and should not be used as the sole basis for treatment or other patient management decisions.  A negative result may occur with improper specimen collection / handling, submission of specimen other than nasopharyngeal swab, presence of viral mutation(s) within the areas targeted by this assay, and inadequate number of viral copies (<250 copies / mL). A negative result must be combined with clinical observations, patient history, and epidemiological information.  Fact Sheet for Patients:   https://www.patel.info/  Fact Sheet for Healthcare Providers: https://hall.com/  This test is not yet approved or  cleared by the [place] FDA and has been authorized for  detection and/or diagnosis of SARS-CoV-2 by FDA under an Emergency Use Authorization (EUA).  This EUA will remain in effect (meaning this test can be used) for the duration of the COVID-19 declaration under Section 564(b)(1) of the Act, 21 U.S.C. section 360bbb-3(b)(1), unless the authorization is terminated or revoked sooner.  Performed at [facility], [place] [address] [place], [place] [address]       Radiology Studies: No results found.  Scheduled Meds:  benztropine  0.5 mg Oral BID   clonazePAM  1 mg Oral BID   docusate sodium  100 mg Oral BID   enoxaparin (LOVENOX) injection  40 mg Subcutaneous Q24H    haloperidol  5 mg Oral BID   lamoTRIgine  50 mg Oral BID   OLANZapine zydis  5 mg Oral QHS   propranolol  10 mg Oral BID   thiamine  100 mg Oral Daily   Vitamin D (Ergocalciferol)  50,000 Units Oral Q7 days   Continuous Infusions:   LOS: 7 days   Time spent: 35 minutes   Ervine Witucki Loann Quill, MD [facility]  If 7PM-7AM, please contact night-coverage [email or web address] 04/17/2022, 12:02 PM

## 2022-04-17 NOTE — TOC Progression Note (Signed)
Transition of Care [facility]) - Progression Note    Patient Details  Name: Barbara Gordon MRN: [id number] Date of Birth: [birth year]  Transition of [facility] CM/SW Contact  Golda Acre, RN Phone Number: 04/17/2022, 8:16 AM  Clinical Narrative:    IVC papers on chart for md to complete and sign at 0817-message sent to md alerting of this.        Expected Discharge Plan and Services                                                 Social Determinants of Health (SDOH) Interventions    Readmission Risk Interventions     No data to display

## 2022-04-17 NOTE — Consult Note (Addendum)
[facility] Face-to-Face Psychiatry Consult   Reason for Consult: ''? 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 female patient admitted with altered mental status.  HPI:  Patient is a 31 year old Caucasian female with past medical history significant for Schizophrenia, substance abuse, substance induced psychosis, acquired hypothyroidism as well as other comorbidities who presented with altered mental status and acute encephalopathy after having worsening confusion in the [facility] emergency department while being on a psych hold via behavioral health. Patient reportedly became more confused, agitated, after her boyfriend visited her. As a result, behavioral health requested admission to the hospital service for further evaluation and management of acute encephalopathy. Patient also reported that her boyfriend had brought her a "drug from the street" but did not elaborate as to the specific drug nor class of medication that was involved.   Per nursing, overnight no issues with aggression or behavioral outburst. Mood is labile, crying.   On my exam, pt continues to be confused about why she is in the hospital and her current symptoms. She states that she is "not really" having any AH or VH. And states that "I'm not sure" in regards to questioning about paranoia. She reports that she was feelin gmore suspicious and paranoid within the last few days but cannot ascertain whether these thoughts have changed.  Reports that mood is sad. She  is crying. Reports sleep is better. Appetite is okay. Denies SI and HI. She does not appear to be RTIS.   Appreciated neuro recs.  Past Psychiatric History: as above  Risk to Self:  yes Risk to Others:  denies Prior Inpatient Therapy:   Prior Outpatient Therapy:  non-compliant  Past Medical History:  Past Medical History:  Diagnosis Date   Hypothyroidism    Polysubstance abuse ([place])    Schizophrenia ([place])    History reviewed. No pertinent surgical history. Family History: History reviewed. No pertinent family history. Family Psychiatric  History:  Social History:  Social History   Substance and Sexual Activity  Alcohol Use Not Currently     Social History   Substance and Sexual Activity  Drug Use Not Currently   Types: Cocaine, Amphetamines, Methamphetamines, Marijuana    Social History   Socioeconomic History   Marital status: Single    Spouse name: Not on file   Number of children: Not on file   Years of education: Not on file   Highest education level: Not on file  Occupational History   Not on file  Tobacco Use   Smoking status: Some Days    Types: Cigarettes   Smokeless tobacco: Never  Substance and Sexual Activity   Alcohol use: Not Currently   Drug use: Not Currently    Types: Cocaine, Amphetamines, Methamphetamines, Marijuana   Sexual activity: Never  Other Topics Concern   Not on file  Social History Narrative   Not on file   Social Determinants of Health   Financial Resource Strain: Not on file  Food Insecurity: Not on file  Transportation Needs: Not on file  Physical Activity: Not on  file  Stress: Not on file  Social Connections: Not on file   Additional Social History:    Allergies:   Allergies  Allergen Reactions   Latex Itching and Rash   Tramadol Itching and Other (See Comments)    Provider: Marijean Bravo CFM - Allergy Description: TraMADol HCl *ANALGESICS - OPIOID* CFM - Allergy Annotation: Pruritus.     Labs:  Results  for orders placed or performed during the hospital encounter of 04/08/22 (from the past 48 hour(s))  Prolactin     Status: Abnormal   Collection Time: 04/16/22  8:43 AM  Result Value Ref Range   Prolactin 189.0 (H) 4.8 - 23.3 ng/mL    Comment: (NOTE) Performed At: [facility] [place], [place] [id number] Rush Farmer MD [id number]   Cortisol     Status: None   Collection Time: 04/16/22  8:43 AM  Result Value Ref Range   Cortisol, Plasma 6.3 ug/dL    Comment: (NOTE) AM    6.7 - 22.6 ug/dL PM   <10.0       ug/dL Performed at [facility] [address], [place], [place] [address]      Current Facility-Administered Medications  Medication Dose Route Frequency Provider Last Rate Last Admin   acetaminophen (TYLENOL) tablet 650 mg  650 mg Oral Q6H PRN Howerter, Justin B, DO   650 mg at 04/17/22 0947   Or   acetaminophen (TYLENOL) suppository 650 mg  650 mg Rectal Q6H PRN Howerter, Justin B, DO       alum & mag hydroxide-simeth (MAALOX/MYLANTA) 200-200-20 MG/5ML suspension 30 mL  30 mL Oral Q6H PRN Hayden Rasmussen, MD   30 mL at 04/15/22 2247   benztropine (COGENTIN) tablet 0.5 mg  0.5 mg Oral BID Leevy-Johnson, Brooke A, NP   0.5 mg at 04/17/22 0940   clonazePAM (KLONOPIN) tablet 1 mg  1 mg Oral BID Suella Broad, FNP   1 mg at 04/17/22 0939   docusate sodium (COLACE) capsule 100 mg  100 mg Oral BID Howerter, Justin B, DO   100 mg at 04/17/22 0939   enoxaparin (LOVENOX) injection 40 mg  40 mg Subcutaneous Q24H Howerter, Justin B, DO   40 mg at 04/17/22 0940   haloperidol (HALDOL) tablet 5 mg  5 mg Oral BID Suella Broad, FNP   5 mg at 04/17/22 0940   lamoTRIgine (LAMICTAL) tablet 50 mg  50 mg Oral BID Suella Broad, FNP   50 mg at 04/17/22 [id number]   lip balm (CARMEX) ointment   Topical PRN Raiford Noble Latif, DO       nicotine polacrilex (NICORETTE) gum 2 mg  2 mg Oral PRN Hayden Rasmussen, MD   2 mg at 04/15/22 0804    OLANZapine zydis (ZYPREXA) disintegrating tablet 5 mg  5 mg Oral QHS Suella Broad, FNP   5 mg at 04/16/22 2251   ondansetron (ZOFRAN) tablet 4 mg  4 mg Oral Q8H PRN Hayden Rasmussen, MD   4 mg at 04/14/22 1711   polyethylene glycol (MIRALAX / GLYCOLAX) packet 17 g  17 g Oral Daily PRN Howerter, Justin B, DO       propranolol (INDERAL) tablet 10 mg  10 mg Oral BID Leevy-Johnson, Brooke A, NP   10 mg at 04/17/22 0940   thiamine tablet 100 mg  100 mg Oral Daily Hayden Rasmussen, MD   100 mg at 04/17/22 [id number]   Vitamin D (Ergocalciferol) (  DRISDOL) capsule 50,000 Units  50,000 Units Oral Q7 days Caren Griffins, MD   50,000 Units at 04/16/22 2257    Musculoskeletal: Strength & Muscle Tone: Laying in bed   Gait & Station: Laying in bed  Patient leans: Laying in bed       Psychiatric Specialty Exam:  Presentation  General Appearance: -- (laying in bed, tearful)  Eye Contact:Poor  Speech:Slow  Speech Volume:Decreased  Handedness:Right   Mood and Affect  Mood:Anxious; Dysphoric  Affect:Tearful; Constricted   Thought Process  Thought Processes:Goal Directed  Descriptions of Associations:Intact  Orientation:Full (Time, Place and Person)  Thought Content:Logical  History of Schizophrenia/Schizoaffective disorder:Yes  Duration of Psychotic Symptoms:Greater than six months  Hallucinations:Hallucinations: -- (Ah and VH - "not really")  Ideas of Reference:-- (denies but vague about this)  Suicidal Thoughts:Suicidal Thoughts: No  Homicidal Thoughts:Homicidal Thoughts: No   Sensorium  Memory:Immediate Poor; Recent Poor; Remote Poor  Judgment:Poor  Insight:Poor   Executive Functions  Concentration:Poor  Attention Span:Poor  [place]   Psychomotor Activity  Psychomotor Activity:Psychomotor Activity: Decreased   Assets  Assets:Desire for Improvement; Financial Resources/Insurance; Physical  Health   Sleep  Sleep:Sleep: Fair   Physical Exam: Physical Exam Vitals and nursing note reviewed.  Constitutional:      Appearance: Normal appearance. She is normal weight.  Skin:    General: Skin is warm.     Capillary Refill: Capillary refill takes less than 2 seconds.  Neurological:     General: No focal deficit present.     Mental Status: She is alert and oriented to person, place, and time. Mental status is at baseline.  Psychiatric:        Attention and Perception: Attention and perception normal.        Mood and Affect: Mood is anxious. Affect is inappropriate.        Speech: Speech is tangential.        Behavior: Behavior is hyperactive.        Thought Content: Thought content normal.        Cognition and Memory: Cognition and memory normal.        Judgment: Judgment is impulsive.    Review of Systems  Psychiatric/Behavioral: Negative.    All other systems reviewed and are negative.  Blood pressure (!) 136/95, pulse 91, temperature 98.1 F (36.7 C), temperature source Oral, resp. rate 20, height 5' 1.5" (1.562 m), weight 52.4 kg, SpO2 99 %. Body mass index is 21.47 kg/m.  Treatment Plan Summary: 31 year old female with history of mental illness and substance abuse who was admitted to the hospital for altered mental status.  Patient with slow improvement to psychotropic medication.  She does not seem to be as psychotic, delusional or responding to internal stimuli with recent psychiatric med changes.   Will continue to recommend inpatient psychiatric admission, as patient will benefit from daily assessments, closely monitoring side effects of medication, and psychiatric stabilization.  Appreciated neurology recommendations regarding Mri and LP. Thank you.   Recommendations: -Continue 1:1 sitter for safety -continue oral Klonopin 1mg  po BID  -Continue haldol 5 mg bid with cogentin 0.5 mg bid -continue zyprexa 5 mg qhs   Because patient has not been able to  achieve symptom control on the antipsychotic monotherapy, she will be treated with 2 separate antipsychotic medications Haldol and olanzapine Zydis.  He will benefit patient to continue this combination therapy as recommended, until symptoms improve.  Once symptoms have improved and  or been reduce, patient may be titrated to monotherapy at the discretion and proper judgment of her inpatient provider.  Previous antipsychotic medication trials have included Haldol, risperidone, olanzapine (with some response, however resulted in over sedation was discontinued); if additional work-up is determined to be negative patient may be a good candidate for clozapine.  -Continue Lamictal 50 mg p.o. twice daily for mood stabilization.  Patient remains high risk for adverse drug event   -Repeat EKG obtained on June 8, QTc of 453. -Patient remains under involuntary commitment at this time, current IVC expires on June 9.  We will recommend renewing IVC patient continues to be a danger to self at this time. -Consider social worker consult to facilitate inpatient psychiatric admission after pt is medically stabilized.  Patient is under review at [facility], pending neuro work-up.  Disposition:  Recommend psychiatric Inpatient admission when medically cleared. Psychiatric service will follow the patient  Christoper Allegra, MD 04/17/2022 10:06 AM  Total Time Spent in Direct Patient Care:  I personally spent 35 minutes on the unit in direct patient care. The direct patient care time included face-to-face time with the patient, reviewing the patient's chart, communicating with other professionals, and coordinating care. Greater than 50% of this time was spent in counseling or coordinating care with the patient regarding goals of hospitalization, psycho-education, and discharge planning needs.   Janine Limbo, MD Psychiatrist

## 2022-04-17 NOTE — Plan of Care (Signed)
?  Problem: Coping: ?Goal: Level of anxiety will decrease ?Outcome: Progressing ?  ?Problem: Safety: ?Goal: Ability to remain free from injury will improve ?Outcome: Progressing ?  ?

## 2022-04-17 NOTE — Plan of Care (Signed)
  Problem: Education: Goal: Knowledge of General Education information will improve Description: Including pain rating scale, medication(s)/side effects and non-pharmacologic comfort measures Outcome: Progressing   Problem: Health Behavior/Discharge Planning: Goal: Ability to manage health-related needs will improve Outcome: Progressing   Problem: Clinical Measurements: Goal: Ability to maintain clinical measurements within normal limits will improve Outcome: Progressing Goal: Will remain free from infection Outcome: Progressing Goal: Diagnostic test results will improve Outcome: Progressing Goal: Respiratory complications will improve Outcome: Progressing Goal: Cardiovascular complication will be avoided Outcome: Progressing   Problem: Activity: Goal: Risk for activity intolerance will decrease Outcome: Progressing   Problem: Nutrition: Goal: Adequate nutrition will be maintained Outcome: Progressing   Problem: Coping: Goal: Level of anxiety will decrease Outcome: Progressing   Problem: Elimination: Goal: Will not experience complications related to bowel motility Outcome: Progressing Goal: Will not experience complications related to urinary retention Outcome: Progressing   Problem: Pain Managment: Goal: General experience of comfort will improve Outcome: Progressing   Problem: Safety: Goal: Ability to remain free from injury will improve Outcome: Progressing   Problem: Skin Integrity: Goal: Risk for impaired skin integrity will decrease Outcome: Progressing   Problem: Education: Goal: Knowledge of warning signs, risks, and behaviors that relate to suicide ideation and self-harm behaviors will improve Outcome: Progressing   Problem: Health Behavior/Discharge (Transition) Planning: Goal: Ability to manage health-related needs will improve Outcome: Progressing   Problem: Clinical Measurements: Goal: Remain free from any harm during hospitalization Outcome:  Progressing   Problem: Nutrition: Goal: Adequate fluids and nutrition will be maintained Outcome: Progressing   Problem: Coping: Goal: Ability to disclose and discuss thoughts of suicide and self-harm will improve Outcome: Progressing   Problem: Medication Management: Goal: Adhere to prescribed medication regimen Outcome: Progressing   Problem: Sleep Hygiene: Goal: Ability to obtain adequate restful sleep will improve Outcome: Progressing   Problem: Self Esteem: Goal: Ability to verbalize positive feeling about self will improve Outcome: Progressing   

## 2022-04-17 NOTE — Progress Notes (Signed)
Patient picked up by [facility] PD.

## 2022-04-17 NOTE — Progress Notes (Signed)
Transport set up with [employer]. Report called to Liv at [facility].

## 2022-04-18 ENCOUNTER — Encounter (HOSPITAL_COMMUNITY): Payer: Self-pay | Admitting: Psychiatry

## 2022-04-18 ENCOUNTER — Other Ambulatory Visit: Payer: Self-pay

## 2022-04-18 DIAGNOSIS — F209 Schizophrenia, unspecified: Principal | ICD-10-CM | POA: Diagnosis present

## 2022-04-18 DIAGNOSIS — F200 Paranoid schizophrenia: Secondary | ICD-10-CM | POA: Diagnosis not present

## 2022-04-18 LAB — PREGNANCY, URINE: Preg Test, Ur: NEGATIVE

## 2022-04-18 MED ORDER — OLANZAPINE 5 MG PO TBDP
ORAL_TABLET | ORAL | Status: AC
  Filled 2022-04-18: qty 1

## 2022-04-18 MED ORDER — MAGNESIUM HYDROXIDE 400 MG/5ML PO SUSP
30.0000 mL | Freq: Every day | ORAL | Status: DC | PRN
  Administered 2022-04-19: 30 mL via ORAL
  Filled 2022-04-18: qty 30

## 2022-04-18 MED ORDER — NICOTINE POLACRILEX 2 MG MT GUM: 2.0000 mg | CHEWING_GUM | OROMUCOSAL | Status: DC | PRN

## 2022-04-18 MED ORDER — OLANZAPINE 5 MG PO TBDP
5.0000 mg | ORAL_TABLET | Freq: Every day | ORAL | Status: DC
  Administered 2022-04-18: 5 mg via ORAL
  Filled 2022-04-18 (×2): qty 1

## 2022-04-18 MED ORDER — LORAZEPAM 1 MG PO TABS
1.0000 mg | ORAL_TABLET | Freq: Once | ORAL | Status: AC
Start: 2022-04-18 — End: 2022-04-18
  Administered 2022-04-18: 1 mg via ORAL
  Filled 2022-04-18: qty 1

## 2022-04-18 MED ORDER — ACETAMINOPHEN 325 MG PO TABS
650.0000 mg | ORAL_TABLET | Freq: Four times a day (QID) | ORAL | Status: DC | PRN
  Administered 2022-04-18 (×2): 650 mg via ORAL
  Filled 2022-04-18 (×2): qty 2

## 2022-04-18 MED ORDER — HYDROXYZINE HCL 25 MG PO TABS
25.0000 mg | ORAL_TABLET | Freq: Three times a day (TID) | ORAL | Status: DC | PRN
Start: 2022-04-18 — End: 2022-04-20
  Administered 2022-04-18 – 2022-04-20 (×5): 25 mg via ORAL
  Filled 2022-04-18 (×5): qty 1

## 2022-04-18 MED ORDER — OLANZAPINE 5 MG PO TBDP
2.5000 mg | ORAL_TABLET | Freq: Every day | ORAL | Status: DC
  Administered 2022-04-18 – 2022-04-19 (×2): 2.5 mg via ORAL
  Filled 2022-04-18 (×3): qty 0.5

## 2022-04-18 MED ORDER — ALUM & MAG HYDROXIDE-SIMETH 200-200-20 MG/5ML PO SUSP: 30.0000 mL | ORAL | Status: DC | PRN

## 2022-04-18 MED ORDER — NICOTINE 14 MG/24HR TD PT24
14.0000 mg | MEDICATED_PATCH | Freq: Every day | TRANSDERMAL | Status: DC
  Administered 2022-04-19 – 2022-04-22 (×4): 14 mg via TRANSDERMAL
  Filled 2022-04-18 (×8): qty 1

## 2022-04-18 MED ORDER — TRAZODONE HCL 50 MG PO TABS
50.0000 mg | ORAL_TABLET | Freq: Every evening | ORAL | Status: DC | PRN
  Administered 2022-04-18 – 2022-04-21 (×4): 50 mg via ORAL
  Filled 2022-04-18 (×4): qty 1
  Filled 2022-04-18: qty 14
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
## 2022-04-18 NOTE — Group Note (Signed)
LCSW Group Therapy Note   Group was not held due to acuity on the unit and low staffing for CSW team.  Masen Salvas J Grossman-Orr, LCSWA 04/18/2022  12:40 PM   

## 2022-04-18 NOTE — Progress Notes (Signed)
   04/18/22 1100  Psych Admission Type (Psych Patients Only)  Admission Status Involuntary  Psychosocial Assessment  Patient Complaints Anxiety;Irritability;Suspiciousness  Eye Contact Brief  Facial Expression Flat  Affect Anxious;Sad  Speech Soft  Interaction Guarded  Motor Activity Slow  Appearance/Hygiene Disheveled  Behavior Characteristics Anxious;Guarded;Irritable  Mood Anxious;Labile  Thought Pension scheme manager thinking  Content Paranoia  Delusions Paranoid  Perception Hallucinations  Hallucination Visual  Judgment Poor  Confusion Mild  Danger to Self  Current suicidal ideation? Denies  Agreement Not to Harm Self Yes  Description of Agreement Verbal  Danger to Others  Danger to Others None reported or observed

## 2022-04-18 NOTE — Progress Notes (Signed)
[facility] Group Notes:  (Nursing/MHT/Case Management/Adjunct)  Date:  04/18/2022  Time:  2000  Type of Therapy:   wrap up group  Participation Level:  None  Participation Quality:  Resistant  Affect:  Angry and Resistant  Cognitive:  Delusional  Insight:  Lacking  Engagement in Group:  None  Modes of Intervention:  Clarification, Education, and Support  Summary of Progress/Problems: Positive thinking and positive change were discussed. Pt joined group after it began but was pacing back and forth towards the door. Pt had two verbal outburst while another patient was sharing. Pt stated "this is not funny" "this is bullshit".  Pt was invited to group but came on her own volition. Pt was asked to exit group and return to her room. Pt left group and nurse notified of behavior.   Marcille Buffy 04/18/2022, 9:12 PM

## 2022-04-18 NOTE — [facility] Group Notes (Signed)
Adult Psychoeducational Group Not Date:  04/18/2022 Time:  0900-1045 Group Topic/Focus: PROGRESSIVE RELAXATION. A group where deep breathing is taught and tensing and relaxation muscle groups is used. Imagery is used as well.  Pts are asked to imagine 3 pillars that hold them up when they are not able to hold themselves up and to share that with the group.  Participation Level:  did not attend  Barbara Gordon

## 2022-04-18 NOTE — Progress Notes (Signed)
   04/18/22 0155  Psych Admission Type (Psych Patients Only)  Admission Status Involuntary  Psychosocial Assessment  Patient Complaints Anxiety  Eye Contact Brief  Facial Expression Flat  Speech Soft  Interaction Guarded  Motor Activity Slow  Appearance/Hygiene Disheveled  Behavior Characteristics Anxious;Guarded  Mood Anxious;Depressed  Thought Process  Coherency Concrete thinking  Content Paranoia  Delusions Paranoid  Perception UTA  Hallucination UTA  Judgment Poor  Confusion None  Danger to Self  Current suicidal ideation? Denies  Agreement Not to Harm Self Yes  Description of Agreement verbal  Danger to Others  Danger to Others None reported or observed
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
## 2022-04-19 ENCOUNTER — Encounter (HOSPITAL_COMMUNITY): Payer: Self-pay

## 2022-04-19 DIAGNOSIS — F200 Paranoid schizophrenia: Secondary | ICD-10-CM | POA: Diagnosis not present

## 2022-04-19 LAB — HEMOGLOBIN A1C
Hgb A1c MFr Bld: 4.7 % — ABNORMAL LOW (ref 4.8–5.6)
Mean Plasma Glucose: 88.19 mg/dL

## 2022-04-19 MED ORDER — OLANZAPINE 10 MG PO TBDP
10.0000 mg | ORAL_TABLET | Freq: Every day | ORAL | Status: DC
  Administered 2022-04-19 – 2022-04-21 (×3): 10 mg via ORAL
  Filled 2022-04-19 (×4): qty 1

## 2022-04-19 MED ORDER — OLANZAPINE 2.5 MG PO TABS
2.5000 mg | ORAL_TABLET | Freq: Every day | ORAL | Status: DC
  Filled 2022-04-19: qty 1

## 2022-04-19 NOTE — [facility] Group Notes (Signed)
Adult Psychoeducational Group Note  Date:  04/19/2022 Time:  11:19 AM  Group Topic/Focus:  Goals Group:   The focus of this group is to help patients establish daily goals to achieve during treatment and discuss how the patient can incorporate goal setting into their daily lives to aide in recovery.  Participation Level:  Active  Participation Quality:  Attentive  Affect:  Appropriate  Cognitive:  Appropriate  Insight: Appropriate  Engagement in Group:  Engaged  Modes of Intervention:  Discussion  Additional Comments:  Patient attended goals group. Patient's goal was to get a discharge plan.   Emi Lymon T Rica Heather 04/19/2022, 11:19 AM

## 2022-04-19 NOTE — Progress Notes (Signed)
   04/19/22 2100  Psych Admission Type (Psych Patients Only)  Admission Status Involuntary  Psychosocial Assessment  Patient Complaints Anxiety;Suspiciousness  Eye Contact Fair  Facial Expression Anxious  Affect Anxious  Speech Soft;Slow;Tangential  Interaction Cautious;Minimal  Motor Activity Restless  Appearance/Hygiene Unremarkable  Behavior Characteristics Cooperative  Mood Anxious;Preoccupied  Aggressive Behavior  Effect No apparent injury  Thought Process  Coherency Tangential  Content Preoccupation  Delusions None reported or observed  Perception WDL  Hallucination None reported or observed  Judgment Impaired  Confusion Mild  Danger to Self  Current suicidal ideation? Denies  Danger to Others  Danger to Others None reported or observed

## 2022-04-19 NOTE — Progress Notes (Signed)
Recreation Therapy Notes  INPATIENT RECREATION THERAPY ASSESSMENT  Patient Details Name: Tamryn Popko MRN: [id number] DOB: 03-Jun-1991 Today's Date: 04/19/2022       Information Obtained From: Patient  Able to Participate in Assessment/Interview: Yes  Patient Presentation: Labile, Withdrawn  Reason for Admission (Per Patient): Patient Unable to Identify  Patient Stressors:  ("not really")  Coping Skills:   Isolation, Music, Talk, Substance Abuse, Avoidance, Hot Bath/Shower  Leisure Interests (2+):  Individual - Other (Comment) ("hangout with [name]"; sleep)  Frequency of Recreation/Participation: Other (Comment) (Hang with [name]- Every now and then; Sleep- A lot)  Awareness of Community Resources:  No  Expressed Interest in [employer] Information: No  [place]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

## 2022-04-19 NOTE — Progress Notes (Addendum)
Psychiatric inpatient progress note  Patient Identification: Barbara Gordon MRN:  [id number] Date of Evaluation:  04/19/2022  Chief Complaint:  Schizophrenia (HCC) [F20.9]  History of Present Illness:  Barbara Gordon is a 31 y.o., female with a past psychiatric history significant for schizophrenia, substance use disorder and substance-induced psychosis who presents to the [facility] from [facility] emergency department for evaluation and management of worsening confusion and agitation.  According to outside records, the patient was admitted primarily on 6/2 for altered mental status to medical floor, psych consult on 6/5 indicates psychosis and paranoia, confusion after exposure to illicit substances and was recommended for admission after medically cleared.  During interview today 04/18/2022 on the inpatient unit, the patient presents disorganized guarded psychotic and paranoid unable to provide linear detailed history information responding to a lot of questions "I am not sure I do not know" she tells me that she has diagnoses of borderline disorder but unable to give any further details she is unsure if she had previous psychiatric hospitalizations but denies any previous suicide attempts she tells me she tried psychiatric medications before but unable to name them and unable to confirm if she was on them at home.  She does report good sleep denies paranoia but tells me that she is afraid that somebody is about to harm her child and he earlier told staff that she needed to go to her room because somebody is molesting her children and here, she denies to me any SI HI or AVH, presents with thought blocking paranoid does not appear responding to stimuli, no self-injurious or aggressive behavior noted.  She denies feeling depressed but presents emotional and crying she denies symptoms consistent with mania or hypomania or PTSD.  04/19/2022 Staff report patient was easily distracted  yesterday paranoid and required a lot of redirection received her medications.  Upon evaluation this morning patient is lying down in bed reports good sleep last night but feeling tired this morning probably secondary to medications, trazodone was given last night for sleep in addition to Zyprexa night dose.  She continues to be vague in her responses unable to tell me why she came to the hospital were reviewed with her reason for admission per Chart she responds "I guess" she denies to me SI HI or AVH she does present regarding with questionable paranoia seems anxious and fearful, no self-injurious or aggressive behavior noted.  Denies side effects to current medication regimen except for feeling tired this morning. We will continue current medications the same except titrate Zyprexa to 10 mg at bedtime and monitor efficacy and safety.  Addendum: came to reevaluate patient this afternoon, she is lying down in bed, notes feeling tired, abel to answer some questions in linear manner, tells me she has her 63 yo son lives with his paternal grandmother but she doesn't see him, notes no paranoia from staff or peers in here, d/w pt elevated prolactin level, she denies galactorrhea currently or in the past. Preg test negative. D/w pt goals of treatment before dc home, she agrees at this time to cont compliance with meds after dc but vague regard obstacles to compliance previously.  Chart review: Consult note during evaluation on 6/4 indicates diagnosis of brief psychotic disorder, cannabis use with psychosis, medications included Haldol, Cogentin, Saphris, Lamictal, Ativan, urine drug screen was positive for benzos and marijuana.  Previous notes from emergency room April 2023 indicates history of dystonic reaction secondary to Haldol during previous treatment.  ED visit in March  2023 indicates diagnosis was brief psychotic disorder at that time, emergency room visit in February 2023 indicates altered mental  status. Admission MD see note from medical floor prior to this admission as well as psychiatric consult notes were reviewed, patient was noted to have elevated prolactin level which is new compared to previous level obtained earlier this year. MRI brain was completed while patient was admitted for stroke and neurology was consulted and noted abnormalities probably related to TBI with no further work-up recommended. Further chart review indicates admission in March 2023 For substance-induced psychosis and was discharged on Cogentin Prozac Haldol and Lamictal.  History of TBI with 1 incident of seizure noted per chart review  Psych meds prior to admission: Haldol, Ativan, Cogentin and Saphris     Collateral information: Spoke to patient's boyfriend Ladona Ridgel who she lives with, admits to patient using marijuana prior to admission, notes on and off compliance with outpatient medication secondary to financial stressors, he does also admit to patient having trouble making your outpatient follow-up appointment secondary to lack of transportation.  Past Psychiatric History:  Prior Psychiatric diagnoses: Schizophrenia, substance use disorder and substance-induced psychosis Past Psychiatric Hospitalizations: Multiple ED visits for altered mental status and psychosis  History of self mutilation: None noted Past suicide attempts: Patient denies Past history of HI, violent or aggressive behavior: Denies  Past Psychiatric medications trials: Per chart review Haldol with history of dystonic reaction, history of treatment with Saphris Ativan and Cogentin History of ECT/TMS: None noted  Outpatient psychiatric Follow up: Unknown Prior Outpatient Therapy: Unknown    Is the patient at risk to self? Yes.    Has the patient been a risk to self in the past 6 months? No.  Has the patient been a risk to self within the distant past? No.  Is the patient a risk to others? No.  Has the patient been a risk to  others in the past 6 months? No.  Has the patient been a risk to others within the distant past? No.    Substance Use History: Alcohol: Denied Tobacoo: 1 pack daily Marijuana: Admits to marijuana use daily Cocaine: Denies Stimulants: Denies IV drug use: Denies Opiates: Denies Prescribed Meds abuse: Denies H/O withdrawals, blackouts, DTs: Denies History of Detox / Rehab: Denies DUI: Denies  Alcohol Screening: 1. How often do you have a drink containing alcohol?: Never 2. How many drinks containing alcohol do you have on a typical day when you are drinking?: 1 or 2 3. How often do you have six or more drinks on one occasion?: Never AUDIT-C Score: 0 4. How often during the last year have you found that you were not able to stop drinking once you had started?: Never 5. How often during the last year have you failed to do what was normally expected from you because of drinking?: Never 6. How often during the last year have you needed a first drink in the morning to get yourself going after a heavy drinking session?: Never 7. How often during the last year have you had a feeling of guilt of remorse after drinking?: Never 8. How often during the last year have you been unable to remember what happened the night before because you had been drinking?: Never 9. Have you or someone else been injured as a result of your drinking?: No 10. Has a relative or friend or a doctor or another health worker been concerned about your drinking or suggested you cut down?: No Alcohol Use Disorder  Identification Test Final Score (AUDIT): 0 Substance Abuse History in the last 12 months:  Yes.     Tobacco Screening:     Past Medical/Surgical History:  Past Medical History:  Diagnosis Date   Hypothyroidism    Polysubstance abuse (HCC)    Schizophrenia (HCC)    History reviewed. No pertinent surgical history.  Family History: History reviewed. No pertinent family history.  Family Psychiatric History:   Psychiatric illness: Denies Suicide: Denies Substance Abuse: Denies  Social History:  Social History   Substance and Sexual Activity  Alcohol Use Not Currently     Social History   Substance and Sexual Activity  Drug Use Not Currently   Types: Cocaine, Amphetamines, Methamphetamines, Marijuana    Living situation: Lives with her boyfriend of 6 months and his mother Social support: Limited Marital Status: Never married Children: 65 years old son.  Patient lives with his grandma Education: Unknown Employment: Immunologist: None Legal history: Denies pending charges or court dates Trauma: Patient denies Access to guns: Denies   Allergies:   Allergies  Allergen Reactions   Latex Itching and Rash   Tramadol Itching and Other (See Comments)    Provider: Crist Fat CFM - Allergy Description: TraMADol HCl *ANALGESICS - OPIOID* CFM - Allergy Annotation: Pruritus.     Lab Results:  Results for orders placed or performed during the hospital encounter of 04/17/22 (from the past 48 hour(s))  Pregnancy, urine     Status: None   Collection Time: 04/18/22  3:56 PM  Result Value Ref Range   Preg Test, Ur NEGATIVE NEGATIVE    Comment:        THE SENSITIVITY OF THIS METHODOLOGY IS >20 mIU/mL. Performed at [facility], [address] [place], [place] [address]   Hemoglobin A1c     Status: Abnormal   Collection Time: 04/18/22  6:29 PM  Result Value Ref Range   Hgb A1c MFr Bld 4.7 (L) 4.8 - 5.6 %    Comment: (NOTE) Pre diabetes:          5.7%-6.4%  Diabetes:              >6.4%  Glycemic control for   <7.0% adults with diabetes    Mean Plasma Glucose 88.19 mg/dL    Comment: Performed at [facility], [address], [place], [place] [address]    Blood Alcohol level:  Lab Results  Component Value Date   [facility] <10 04/10/2022   ETH <10 04/08/2022    Metabolic Disorder Labs:  Lab Results  Component Value Date   HGBA1C 4.7  (L) 04/18/2022   MPG 88.19 04/18/2022   MPG 97 01/15/2022   Lab Results  Component Value Date   PROLACTIN 189.0 (H) 04/16/2022   PROLACTIN 2.6 (L) 01/15/2022   Lab Results  Component Value Date   CHOL 171 01/15/2022   TRIG 10 01/15/2022   HDL 36 (L) 01/15/2022   CHOLHDL 4.8 01/15/2022   VLDL 2 01/15/2022   LDLCALC 133 (H) 01/15/2022   LDLCALC 131 (H) 10/09/2021    Current Medications: Current Facility-Administered Medications  Medication Dose Route Frequency Provider Last Rate Last Admin   acetaminophen (TYLENOL) tablet 650 mg  650 mg Oral Q6H PRN Sindy Guadeloupe, NP   650 mg at 04/18/22 1840   alum & mag hydroxide-simeth (MAALOX/MYLANTA) 200-200-20 MG/5ML suspension 30 mL  30 mL Oral Q4H PRN Sindy Guadeloupe, NP       hydrOXYzine (ATARAX) tablet 25  mg  25 mg Oral TID PRN Sarita Bottom, MD   25 mg at 04/19/22 0842   magnesium hydroxide (MILK OF MAGNESIA) suspension 30 mL  30 mL Oral Daily PRN Sindy Guadeloupe, NP   30 mL at 04/19/22 0842   nicotine (NICODERM CQ - dosed in mg/24 hours) patch 14 mg  14 mg Transdermal Daily Aarika Moon, MD   14 mg at 04/19/22 7253   nicotine polacrilex (NICORETTE) gum 2 mg  2 mg Oral PRN Sindy Guadeloupe, NP       Melene Muller ON 04/20/2022] OLANZapine (ZYPREXA) tablet 2.5 mg  2.5 mg Oral Daily Massengill, Nathan, MD       OLANZapine zydis (ZYPREXA) disintegrating tablet 5 mg  5 mg Oral QHS Lindia Garms, MD   5 mg at 04/18/22 2040   traZODone (DESYREL) tablet 50 mg  50 mg Oral QHS PRN Sarita Bottom, MD   50 mg at 04/18/22 2039    PTA Medications: Medications Prior to Admission  Medication Sig Dispense Refill Last Dose   benztropine (COGENTIN) 0.5 MG tablet Take 1 tablet (0.5 mg total) by mouth 2 (two) times daily as needed for up to 30 doses for tremors (EPS). (Patient not taking: Reported on 04/08/2022) 30 tablet 0    FLUoxetine (PROZAC) 10 MG capsule Take 1 capsule (10 mg total) by mouth daily. 30 capsule 0    haloperidol (HALDOL) 10 MG tablet Take 1 tablet (10  mg total) by mouth at bedtime. 30 tablet 0    lamoTRIgine (LAMICTAL) 25 MG tablet Take 1 tablet (25 mg total) by mouth daily for 4 days. (Patient not taking: Reported on 03/02/2022) 4 tablet 0    lamoTRIgine (LAMICTAL) 25 MG tablet Take 2 tablets (50 mg total) by mouth daily for 14 days. 28 tablet 0    liothyronine (CYTOMEL) 25 MCG tablet Take 1 tablet (25 mcg total) by mouth daily. 30 tablet 0    nicotine polacrilex (NICORETTE) 2 MG gum Take 1 each (2 mg total) by mouth as needed for smoking cessation. (Patient not taking: Reported on 04/08/2022) 50 tablet 0    thiamine 100 MG tablet Take 1 tablet (100 mg total) by mouth daily. (Patient not taking: Reported on 04/08/2022)       Musculoskeletal: Strength & Muscle Tone: within normal limits Gait & Station: normal Patient leans: N/A    Psychiatric Specialty Exam:  General Appearance: appears older than stated age, fairly dressed and groomed Behavior: Guarded and paranoid, irritable and restless at times  Psychomotor Activity: Some psychomotor retardation noted  Eye Contact: Limited Speech: Decreased Speech Volume:decreased   Mood: Labile mood and affect noted, easily crying Affect: Labile  Thought Process: Appears less disorganized today more linear responses. Descriptions of Associations: Concrete Thought Content: Hallucinations: Denies Delusions: Paranoid, less Suicidal Thoughts: Denies Homicidal Thoughts: Denies  Alertness: Alert Orientation: Confused, limited orientation   Insight: Poor Judgment: Poor  Memory: Poor  Executive Functions  Concentration: poor  Attention Span: poor Recall: poor Fund of Knowledge:poor   Physical Exam: Physical Exam Pulmonary:     Effort: Pulmonary effort is normal.  Neurological:     General: No focal deficit present.     Mental Status: She is alert.    Review of Systems  Psychiatric/Behavioral:  Positive for depression and substance abuse. The patient is nervous/anxious.     Blood pressure 127/83, pulse (!) 108, temperature 97.9 F (36.6 C), temperature source Oral, resp. rate 16, height  (1.549 m), weight 53.1 kg, last menstrual  period 04/17/2022, SpO2 96 %. Body mass index is 22.11 kg/m.   Assets  Assets:Desire for Improvement; Financial Resources/Insurance; Physical Health    Treatment Plan Summary:   ASSESSMENT:  Principal Diagnosis: Schizophrenia (HCC) Diagnosis:  Principal Problem:   Schizophrenia (HCC)   PLAN: Safety and Monitoring:  -- Involuntary admission to inpatient psychiatric unit for safety, stabilization and treatment  -- Daily contact with patient to assess and evaluate symptoms and progress in treatment  -- Patient's case to be discussed in multi-disciplinary team meeting  -- Observation Level : q15 minute checks  -- Vital signs:  q12 hours  -- Precautions: suicide, elopement, and assault  2. Medications:   Titrate Zyprexa Zydis to 10 mg at bedtime for mood and psychosis  Continue trazodone at bedtime as needed for sleep  Continue Vistaril as needed for anxiety  -- The risks/benefits/side-effects/alternatives to this medication were discussed in detail with the patient and time was given for questions. The patient consents to medication trial.       3. Pertinent labs: EKG 6/8 QTc 453, CMP within normal limit, CBC within normal limits, prolactin elevated at 189, lipid profile in March 2023 indicated elevated LDL 133 and low HDL of 36 otherwise within normal limits,Urine drug screen prior to admission positive for benzos and marijuana  Internal medicine consult note indicates elevated prolactin probably related to antipsychotic medication treatment, will follow and recommend outpatient follow-up after discharge.     Lab ordered: Hemoglobin A1c 4.7   4. Tobacco Use Disorder  -- Nicotine patch 14 mg/24 hours ordered  -- Smoking cessation encouraged  5. Group and Therapy: -- Encouraged patient to participate in unit  milieu and in scheduled group therapies     Patient was counseled to abstain from illicit drug use including marijuana after discharge.  -- Short Term Goals: Ability to identify changes in lifestyle to reduce recurrence of condition will improve, Ability to verbalize feelings will improve, Ability to disclose and discuss suicidal ideas, Ability to demonstrate self-control will improve, Ability to identify and develop effective coping behaviors will improve, and Ability to maintain clinical measurements within normal limits will improve  -- Long Term Goals: Improvement in symptoms so as ready for discharge  6. Discharge Planning:   -- Social work and case management to assist with discharge planning and identification of hospital follow-up needs prior to discharge  -- Estimated LOS: 5-7 days  -- Discharge Concerns: Need to establish a safety plan; Medication compliance and effectiveness  -- Discharge Goals: Return home with outpatient referrals for mental health follow-up including medication management/psychotherapy  Patient would benefit from an emergency room at time of discharge to improve compliance with outpatient follow-up appointments and medications and decrease risk of decompensation given multiple ED visits and hospitalizations past few months.   The patient is agreeable with the medication plan, as above. We will monitor the patient's response to pharmacologic treatment, and adjust medications as necessary. Patient is encouraged to participate in group therapy while admitted to the psychiatric unit. We will address other chronic and acute stressors, which contributed to the patient's worsening paranoia and psychosis, in order to reduce the risk of self-harm at discharge.   Physician Treatment Plan for Primary Diagnosis: Schizophrenia (HCC) Long Term Goal(s): Improvement in symptoms so as ready for discharge  Short Term Goals: Ability to identify changes in lifestyle to reduce  recurrence of condition will improve, Ability to verbalize feelings will improve, Ability to disclose and discuss suicidal ideas, Ability to demonstrate self-control  will improve, and Ability to identify and develop effective coping behaviors will improve   I certify that inpatient services furnished can reasonably be expected to improve the patient's condition.    Total Time Spent in Direct Patient Care:  I personally spent 45 minutes on the unit in direct patient care. The direct patient care time included face-to-face time with the patient, reviewing the patient's chart, communicating with other professionals, and coordinating care. Greater than 50% of this time was spent in counseling or coordinating care with the patient regarding goals of hospitalization, psycho-education, and discharge planning needs.   Maleigha Colvard Abbott Pao, MD 6/12/202311:51 AM

## 2022-04-19 NOTE — Progress Notes (Signed)
Adult Psychoeducational Group Note  Date:  04/19/2022 Time:  10:28 PM  Group Topic/Focus:  Wrap-Up Group:   The focus of this group is to help patients review their daily goal of treatment and discuss progress on daily workbooks.  Participation Level:  Did Not Attend  Participation Quality:   Did Not Attend  Affect:   Did Not Attend  Cognitive:   Did Not Attend  Insight: None  Engagement in Group:   Did Not Attend  Modes of Intervention:   Did Not Attend  Additional Comments:  Pt was encouraged to attend wrap up group but did not attend.  Felipa Furnace 04/19/2022, 10:28 PM

## 2022-04-19 NOTE — [facility] Group Notes (Signed)
Adult Psychoeducational Group Note  Date:  04/19/2022 Time:  5:34 PM  Group Topic/Focus:  Wellness Toolbox:   The focus of this group is to discuss various aspects of wellness, balancing those aspects and exploring ways to increase the ability to experience wellness.  Patients will create a wellness toolbox for use upon discharge.  Participation Level:  Active  Participation Quality:  Appropriate  Affect:  Appropriate  Cognitive:  Appropriate  Insight: Appropriate  Engagement in Group:  Engaged  Modes of Intervention:  Activity  Additional Comments:  Patient attended and participated in the relaxation group activity.  Annie Sable 04/19/2022, 5:34 PM

## 2022-04-19 NOTE — Progress Notes (Signed)
Pt has been delusional, talking to her self. Was asked to go to her room during wrap up group due to being disruptive. Pt walked out shouting on the hallway asking when is the group ending so she could get snacks. Pt started pacing on the hallway talking to herself and required redirections. Pt was Vistaril, and trazodone for sleep, will continue to monitor.

## 2022-04-19 NOTE — Progress Notes (Signed)
Pt visible in milieu at brief intervals during shift. Rates her depression, anxiety and hopelessness all 0/10 Observed to be concrete with flat affect and anxious / fidgety on interactions. Denies SI, HI, AVH, pain and paranoia when assessed. Goal this shift is "Getting out of here". Received PRN Vistaril 25 mg and MOM 30 ml (No BM X 2 days) both at 0842. Awake and calm when reassessed at 0940. Report no result thus far. Pt is medication compliant, denies adverse drug reactions when assessed. Safety checks maintained at Q 15 minutes intervals without outburst. Verbal education provided on ordered medications and effects monitored. Pt tolerated fluids and meals well.  Safety maintained on unit.

## 2022-04-19 NOTE — Group Note (Signed)
Type of Therapy and Topic: Group Therapy: Control  Participation Level: Did Not Attend  Description of Group: In this group patients will discuss what is out of their control, what is somewhat in their control, and what is within their control.  They will be encouraged to explore what issues they can control and what issues are out of their control within their daily lives. They will be guided to discuss their thoughts, feelings, and behaviors related to these issues. The group will process together ways to better control things that are well within our own control and how to notice and accept the things that are not within our control. This group will be process-oriented, with patients participating in exploration of their own experiences as well as giving and receiving support and challenge from other group members.  During this group 2 worksheets will be provided to each patient to follow along and fill out.   Therapeutic Goals: 1. Patient will identify what is within their control and what is not within their control. 2. Patient will identify their thoughts and feelings about having control over their own lives. 3. Patient will identify their thoughts and feelings about not having control over everything in their lives.. 4. Patient will identify ways that they can have more control over their own lives. 5. Patient will identify areas were they can allow others to help them or provide assistance.  Summary of Patient Progress: Patient did not attend group.     Ernesto Zukowski, LCSW, LCAS Clincal Social Worker  [facility]

## 2022-04-19 NOTE — Group Note (Signed)
Recreation Therapy Group Note   Group Topic:Personal Development  Group Date: 04/19/2022 Start Time: 1000 End Time: 1025 Facilitators: Caroll Rancher, LRT,CTRS Location: 500 Hall Dayroom   Goal Area(s) Addresses:  Patient will define peace. Patient will identify the benefits of peace. Patient will identify ways peace in used in day to day situations.   Group Description: Finding Peace.  LRT discussed with patients the meaning of peace and what brings them peace.  LRT and patients discussed how peace can be anywhere you make it and is used to clear your mind of any worries, concerns or any distractions all together.  Patients were given a worksheet the broke down peace into 5 questions.  The first question addressed positive change, the second addressed positive relationships, the third addressed future goals, the fourth question was about being grateful for non-material things and the last question addressed setting limits.    Affect/Mood: Flat and Sad   Participation Level: Minimal   Participation Quality: Independent   Behavior: Calm and Withdrawn   Speech/Thought Process: Focused   Insight: Moderate   Judgement: Moderate   Modes of Intervention: Worksheet   Patient Response to Interventions:  Receptive   Education Outcome:  Acknowledges education and In group clarification offered    Clinical Observations/Individualized Feedback: Pt was quiet for the time she was in group.  Pt expressed peace for her was taking a nap.  Pt filled out worksheet, turned it in and went back to her room.  According to worksheet, pt positive change was having her child.  In positive relationships, pt looks for "my friends".  Pt is working towards getting better.  Pt is grateful for her family and needs to set limits on "everything".    Plan: Continue to engage patient in RT group sessions 2-3x/week.   Caroll Rancher, Antonietta Jewel 04/19/2022 12:13 PM

## 2022-04-19 NOTE — BH IP Treatment Plan (Signed)
Interdisciplinary Treatment and Diagnostic Plan Update  04/19/2022 Time of Session: [place] MRN: [id number]  Principal Diagnosis: Schizophrenia ([place])  Secondary Diagnoses: Principal Problem:   Schizophrenia ([place])   Current Medications:  Current Facility-Administered Medications  Medication Dose Route Frequency Provider Last Rate Last Admin   acetaminophen (TYLENOL) tablet 650 mg  650 mg Oral Q6H PRN Evette Georges, NP   650 mg at 04/18/22 1840   alum & mag hydroxide-simeth (MAALOX/MYLANTA) 200-200-20 MG/5ML suspension 30 mL  30 mL Oral Q4H PRN Evette Georges, NP       hydrOXYzine (ATARAX) tablet 25 mg  25 mg Oral TID PRN Winfred Leeds, Nadir, MD   25 mg at 04/19/22 0842   magnesium hydroxide (MILK OF MAGNESIA) suspension 30 mL  30 mL Oral Daily PRN Evette Georges, NP   30 mL at 04/19/22 0842   nicotine (NICODERM CQ - dosed in mg/24 hours) patch 14 mg  14 mg Transdermal Daily Attiah, Nadir, MD   14 mg at 04/19/22 2993   nicotine polacrilex (NICORETTE) gum 2 mg  2 mg Oral PRN Evette Georges, NP       Derrill Memo ON 04/20/2022] OLANZapine (ZYPREXA) tablet 2.5 mg  2.5 mg Oral Daily Massengill, Nathan, MD       OLANZapine zydis (ZYPREXA) disintegrating tablet 5 mg  5 mg Oral QHS Attiah, Nadir, MD   5 mg at 04/18/22 2040   traZODone (DESYREL) tablet 50 mg  50 mg Oral QHS PRN Dian Situ, MD   50 mg at 04/18/22 2039   PTA Medications: Medications Prior to Admission  Medication Sig Dispense Refill Last Dose   benztropine (COGENTIN) 0.5 MG tablet Take 1 tablet (0.5 mg total) by mouth 2 (two) times daily as needed for up to 30 doses for tremors (EPS). (Patient not taking: Reported on 04/08/2022) 30 tablet 0    FLUoxetine (PROZAC) 10 MG capsule Take 1 capsule (10 mg total) by mouth daily. 30 capsule 0    haloperidol (HALDOL) 10 MG tablet Take 1 tablet (10 mg total) by mouth at bedtime. 30 tablet 0    lamoTRIgine (LAMICTAL) 25 MG tablet Take 1 tablet (25 mg total) by mouth daily for 4 days. (Patient not  taking: Reported on 03/02/2022) 4 tablet 0    lamoTRIgine (LAMICTAL) 25 MG tablet Take 2 tablets (50 mg total) by mouth daily for 14 days. 28 tablet 0    liothyronine (CYTOMEL) 25 MCG tablet Take 1 tablet (25 mcg total) by mouth daily. 30 tablet 0    nicotine polacrilex (NICORETTE) 2 MG gum Take 1 each (2 mg total) by mouth as needed for smoking cessation. (Patient not taking: Reported on 04/08/2022) 50 tablet 0    thiamine 100 MG tablet Take 1 tablet (100 mg total) by mouth daily. (Patient not taking: Reported on 04/08/2022)       Patient Stressors:    Patient Strengths:    Treatment Modalities: Medication Management, Group therapy, Case management,  1 to 1 session with clinician, Psychoeducation, Recreational therapy.   Physician Treatment Plan for Primary Diagnosis: Schizophrenia ([place]) Long Term Goal(s): Improvement in symptoms so as ready for discharge   Short Term Goals: Ability to identify changes in lifestyle to reduce recurrence of condition will improve Ability to verbalize feelings will improve Ability to disclose and discuss suicidal ideas Ability to demonstrate self-control will improve Ability to identify and develop effective coping behaviors will improve Ability to maintain clinical measurements within normal limits will improve  Medication Management: Evaluate patient's response,  side effects, and tolerance of medication regimen.  Therapeutic Interventions: 1 to 1 sessions, Unit Group sessions and Medication administration.  Evaluation of Outcomes: Not Met  Physician Treatment Plan for Secondary Diagnosis: Principal Problem:   Schizophrenia ([place])  Long Term Goal(s): Improvement in symptoms so as ready for discharge   Short Term Goals: Ability to identify changes in lifestyle to reduce recurrence of condition will improve Ability to verbalize feelings will improve Ability to disclose and discuss suicidal ideas Ability to demonstrate self-control will improve Ability  to identify and develop effective coping behaviors will improve Ability to maintain clinical measurements within normal limits will improve     Medication Management: Evaluate patient's response, side effects, and tolerance of medication regimen.  Therapeutic Interventions: 1 to 1 sessions, Unit Group sessions and Medication administration.  Evaluation of Outcomes: Not Met   RN Treatment Plan for Primary Diagnosis: Schizophrenia ([place]) Long Term Goal(s): Knowledge of disease and therapeutic regimen to maintain health will improve  Short Term Goals: Ability to remain free from injury will improve, Ability to verbalize frustration and anger appropriately will improve, Ability to demonstrate self-control, Ability to participate in decision making will improve, Ability to verbalize feelings will improve, Ability to disclose and discuss suicidal ideas, Ability to identify and develop effective coping behaviors will improve, and Compliance with prescribed medications will improve  Medication Management: RN will administer medications as ordered by provider, will assess and evaluate patient's response and provide education to patient for prescribed medication. RN will report any adverse and/or side effects to prescribing provider.  Therapeutic Interventions: 1 on 1 counseling sessions, Psychoeducation, Medication administration, Evaluate responses to treatment, Monitor vital signs and CBGs as ordered, Perform/monitor CIWA, COWS, AIMS and Fall Risk screenings as ordered, Perform wound care treatments as ordered.  Evaluation of Outcomes: Not Met   LCSW Treatment Plan for Primary Diagnosis: Schizophrenia ([place]) Long Term Goal(s): Safe transition to appropriate next level of care at discharge, Engage patient in therapeutic group addressing interpersonal concerns.  Short Term Goals: Engage patient in aftercare planning with referrals and resources, Increase social support, Increase ability to  appropriately verbalize feelings, Increase emotional regulation, Facilitate acceptance of mental health diagnosis and concerns, Facilitate patient progression through stages of change regarding substance use diagnoses and concerns, Identify triggers associated with mental health/substance abuse issues, and Increase skills for wellness and recovery  Therapeutic Interventions: Assess for all discharge needs, 1 to 1 time with Social worker, Explore available resources and support systems, Assess for adequacy in community support network, Educate family and significant other(s) on suicide prevention, Complete Psychosocial Assessment, Interpersonal group therapy.  Evaluation of Outcomes: Not Met   Progress in Treatment: Attending groups: Yes. Participating in groups: Yes. Taking medication as prescribed: Yes. Toleration medication: Yes. Family/Significant other contact made: No, will contact:  CSW will obtain consent to reach family/friend.  Patient understands diagnosis: No. Discussing patient identified problems/goals with staff: No. Medical problems stabilized or resolved: Yes. Denies suicidal/homicidal ideation: No. Issues/concerns per patient self-inventory: Yes. Other: none   New problem(s) identified: No, Describe:  none   New Short Term/Long Term Goal(s): Patient to work towards detox, elimination of symptoms of psychosis, medication management for mood stabilization; elimination of SI thoughts; development of comprehensive mental wellness/sobriety plan.  Patient Goals:  Simply states, "I just want to go home"   Discharge Plan or Barriers: No psychosocial barriers identified at this time, patient to return to place of residence when appropriate for discharge.   Reason for Continuation of Hospitalization:  Other; describe Psychosis   Estimated Length of Stay: 1-7 days   Scribe for Treatment Team: Larose Kells 04/19/2022 11:48 AM

## 2022-04-20 DIAGNOSIS — F200 Paranoid schizophrenia: Secondary | ICD-10-CM | POA: Diagnosis not present

## 2022-04-20 MED ORDER — LACTULOSE 10 GM/15ML PO SOLN
20.0000 g | Freq: Once | ORAL | Status: AC
Start: 2022-04-20 — End: 2022-04-20
  Administered 2022-04-20: 20 g via ORAL
  Filled 2022-04-20: qty 30

## 2022-04-20 MED ORDER — PROPRANOLOL HCL 10 MG PO TABS
10.0000 mg | ORAL_TABLET | Freq: Two times a day (BID) | ORAL | Status: DC
  Administered 2022-04-20 – 2022-04-21 (×2): 10 mg via ORAL
  Filled 2022-04-20 (×4): qty 1

## 2022-04-20 MED ORDER — PROPRANOLOL HCL 10 MG PO TABS
10.0000 mg | ORAL_TABLET | Freq: Once | ORAL | Status: AC
Start: 2022-04-20 — End: 2022-04-20
  Administered 2022-04-20: 10 mg via ORAL
  Filled 2022-04-20 (×2): qty 1

## 2022-04-20 NOTE — Progress Notes (Signed)
Psychiatric inpatient progress note  Patient Identification: Barbara Gordon MRN:  [id number] Date of Evaluation:  04/20/2022  Chief Complaint:  Schizophrenia (HCC) [F20.9]  History of Present Illness:  Barbara Gordon is a 31 y.o., female with a past psychiatric history significant for schizophrenia, substance use disorder and substance-induced psychosis who presents to the [facility] from [facility] emergency department for evaluation and management of worsening confusion and agitation.  According to outside records, the patient was admitted primarily on 6/2 for altered mental status to medical floor, psych consult on 6/5 indicates psychosis and paranoia, confusion after exposure to illicit substances and was recommended for admission after medically cleared.  During interview today 04/18/2022 on the inpatient unit, the patient presents disorganized guarded psychotic and paranoid unable to provide linear detailed history information responding to a lot of questions "I am not sure I do not know" she tells me that she has diagnoses of borderline disorder but unable to give any further details she is unsure if she had previous psychiatric hospitalizations but denies any previous suicide attempts she tells me she tried psychiatric medications before but unable to name them and unable to confirm if she was on them at home.  She does report good sleep denies paranoia but tells me that she is afraid that somebody is about to harm her child and he earlier told staff that she needed to go to her room because somebody is molesting her children and here, she denies to me any SI HI or AVH, presents with thought blocking paranoid does not appear responding to stimuli, no self-injurious or aggressive behavior noted.  She denies feeling depressed but presents emotional and crying she denies symptoms consistent with mania or hypomania or PTSD.  04/19/2022 Staff report patient was easily distracted  yesterday paranoid and required a lot of redirection received her medications.  Upon evaluation this morning patient is lying down in bed reports good sleep last night but feeling tired this morning probably secondary to medications, trazodone was given last night for sleep in addition to Zyprexa night dose.  She continues to be vague in her responses unable to tell me why she came to the hospital were reviewed with her reason for admission per Chart she responds "I guess" she denies to me SI HI or AVH she does present regarding with questionable paranoia seems anxious and fearful, no self-injurious or aggressive behavior noted.  Denies side effects to current medication regimen except for feeling tired this morning. We will continue current medications the same except titrate Zyprexa to 10 mg at bedtime and monitor efficacy and safety.  Addendum: came to reevaluate patient this afternoon, she is lying down in bed, notes feeling tired, abel to answer some questions in linear manner, tells me she has her 73 yo son lives with his paternal grandmother but she doesn't see him, notes no paranoia from staff or peers in here, d/w pt elevated prolactin level, she denies galactorrhea currently or in the past. Preg test negative. D/w pt goals of treatment before dc home, she agrees at this time to cont compliance with meds after dc but vague regard obstacles to compliance previously.  04/20/2022 Patient was evaluated and chart was reviewed no as needed medication needed and given for agitation or aggression.  Zyprexa dose was titrated up last night patient reports good sleep and reports feeling tired this morning she continues to have limited interaction with staff but upon evaluation this morning she is apparently much more linear in her  responses no further paranoia noted does not scared or fearful as upon admission.  No self injures or aggressive behavior noted does not be responding to stimuli, denies SI HI or AVH  asking for discharge home, discussed with patient goals of participation in groups as well as compliance with medication and ongoing interaction with the staff as goals prior to discharge, she agrees.  She denies and does not display any signs of side effects to current medication regimen.  She was counseled again today regarding need to abstain from illicit drug use including marijuana after discharge.  She continues to present with restricted abdomen which seems to be her baseline, appears calm does not appear anxious any longer.   Chart review: Consult note during evaluation on 6/4 indicates diagnosis of brief psychotic disorder, cannabis use with psychosis, medications included Haldol, Cogentin, Saphris, Lamictal, Ativan, urine drug screen was positive for benzos and marijuana.  Previous notes from emergency room April 2023 indicates history of dystonic reaction secondary to Haldol during previous treatment.  ED visit in March 2023 indicates diagnosis was brief psychotic disorder at that time, emergency room visit in February 2023 indicates altered mental status. Admission MD see note from medical floor prior to this admission as well as psychiatric consult notes were reviewed, patient was noted to have elevated prolactin level which is new compared to previous level obtained earlier this year. MRI brain was completed while patient was admitted for stroke and neurology was consulted and noted abnormalities probably related to TBI with no further work-up recommended. Further chart review indicates admission in March 2023 For substance-induced psychosis and was discharged on Cogentin Prozac Haldol and Lamictal.  History of TBI with 1 incident of seizure noted per chart review  Psych meds prior to admission: Haldol, Ativan, Cogentin and Saphris     Collateral information: Spoke to patient's boyfriend Ladona Ridgelaylor who she lives with, admits to patient using marijuana prior to admission, notes on and off  compliance with outpatient medication secondary to financial stressors, he does also admit to patient having trouble making your outpatient follow-up appointment secondary to lack of transportation.  Past Psychiatric History:  Prior Psychiatric diagnoses: Schizophrenia, substance use disorder and substance-induced psychosis Past Psychiatric Hospitalizations: Multiple ED visits for altered mental status and psychosis  History of self mutilation: None noted Past suicide attempts: Patient denies Past history of HI, violent or aggressive behavior: Denies  Past Psychiatric medications trials: Per chart review Haldol with history of dystonic reaction, history of treatment with Saphris Ativan and Cogentin History of ECT/TMS: None noted  Outpatient psychiatric Follow up: Unknown Prior Outpatient Therapy: Unknown    Is the patient at risk to self? Yes.    Has the patient been a risk to self in the past 6 months? No.  Has the patient been a risk to self within the distant past? No.  Is the patient a risk to others? No.  Has the patient been a risk to others in the past 6 months? No.  Has the patient been a risk to others within the distant past? No.    Substance Use History: Alcohol: Denied Tobacoo: 1 pack daily Marijuana: Admits to marijuana use daily Cocaine: Denies Stimulants: Denies IV drug use: Denies Opiates: Denies Prescribed Meds abuse: Denies H/O withdrawals, blackouts, DTs: Denies History of Detox / Rehab: Denies DUI: Denies  Alcohol Screening: 1. How often do you have a drink containing alcohol?: Never 2. How many drinks containing alcohol do you have on a typical day when  you are drinking?: 1 or 2 3. How often do you have six or more drinks on one occasion?: Never AUDIT-C Score: 0 4. How often during the last year have you found that you were not able to stop drinking once you had started?: Never 5. How often during the last year have you failed to do what was normally  expected from you because of drinking?: Never 6. How often during the last year have you needed a first drink in the morning to get yourself going after a heavy drinking session?: Never 7. How often during the last year have you had a feeling of guilt of remorse after drinking?: Never 8. How often during the last year have you been unable to remember what happened the night before because you had been drinking?: Never 9. Have you or someone else been injured as a result of your drinking?: No 10. Has a relative or friend or a doctor or another health worker been concerned about your drinking or suggested you cut down?: No Alcohol Use Disorder Identification Test Final Score (AUDIT): 0 Substance Abuse History in the last 12 months:  Yes.     Tobacco Screening:     Past Medical/Surgical History:  Past Medical History:  Diagnosis Date   Hypothyroidism    Polysubstance abuse (HCC)    Schizophrenia (HCC)    History reviewed. No pertinent surgical history.  Family History: History reviewed. No pertinent family history.  Family Psychiatric History:  Psychiatric illness: Denies Suicide: Denies Substance Abuse: Denies  Social History:  Social History   Substance and Sexual Activity  Alcohol Use Not Currently     Social History   Substance and Sexual Activity  Drug Use Not Currently   Types: Cocaine, Amphetamines, Methamphetamines, Marijuana    Living situation: Lives with her boyfriend of 6 months and his mother Social support: Limited Marital Status: Never married Children: 41 years old son.  Patient lives with his grandma Education: Unknown Employment: Immunologist: None Legal history: Denies pending charges or court dates Trauma: Patient denies Access to guns: Denies   Allergies:   Allergies  Allergen Reactions   Latex Itching and Rash   Tramadol Itching and Other (See Comments)    Provider: Crist Fat CFM - Allergy Description: TraMADol HCl  *ANALGESICS - OPIOID* CFM - Allergy Annotation: Pruritus.     Lab Results:  Results for orders placed or performed during the hospital encounter of 04/17/22 (from the past 48 hour(s))  Pregnancy, urine     Status: None   Collection Time: 04/18/22  3:56 PM  Result Value Ref Range   Preg Test, Ur NEGATIVE NEGATIVE    Comment:        THE SENSITIVITY OF THIS METHODOLOGY IS >20 mIU/mL. Performed at [facility], [address], [place], [place] [address]   Hemoglobin A1c     Status: Abnormal   Collection Time: 04/18/22  6:29 PM  Result Value Ref Range   Hgb A1c MFr Bld 4.7 (L) 4.8 - 5.6 %    Comment: (NOTE) Pre diabetes:          5.7%-6.4%  Diabetes:              >6.4%  Glycemic control for   <7.0% adults with diabetes    Mean Plasma Glucose 88.19 mg/dL    Comment: Performed at [facility], [address], [place], [place] [address]    Blood Alcohol level:  Lab Results  Component  Value Date   ETH <10 04/10/2022   ETH <10 04/08/2022    Metabolic Disorder Labs:  Lab Results  Component Value Date   HGBA1C 4.7 (L) 04/18/2022   MPG 88.19 04/18/2022   MPG 97 01/15/2022   Lab Results  Component Value Date   PROLACTIN 189.0 (H) 04/16/2022   PROLACTIN 2.6 (L) 01/15/2022   Lab Results  Component Value Date   CHOL 171 01/15/2022   TRIG 10 01/15/2022   HDL 36 (L) 01/15/2022   CHOLHDL 4.8 01/15/2022   VLDL 2 01/15/2022   LDLCALC 133 (H) 01/15/2022   LDLCALC 131 (H) 10/09/2021    Current Medications: Current Facility-Administered Medications  Medication Dose Route Frequency Provider Last Rate Last Admin   acetaminophen (TYLENOL) tablet 650 mg  650 mg Oral Q6H PRN Sindy Guadeloupe, NP   650 mg at 04/18/22 1840   alum & mag hydroxide-simeth (MAALOX/MYLANTA) 200-200-20 MG/5ML suspension 30 mL  30 mL Oral Q4H PRN Sindy Guadeloupe, NP       hydrOXYzine (ATARAX) tablet 25 mg  25 mg Oral TID PRN Abbott Pao, Lanelle Lindo, MD   25 mg at 04/20/22 0837   magnesium  hydroxide (MILK OF MAGNESIA) suspension 30 mL  30 mL Oral Daily PRN Sindy Guadeloupe, NP   30 mL at 04/19/22 0842   nicotine (NICODERM CQ - dosed in mg/24 hours) patch 14 mg  14 mg Transdermal Daily Dericka Ostenson, MD   14 mg at 04/20/22 1610   nicotine polacrilex (NICORETTE) gum 2 mg  2 mg Oral PRN Sindy Guadeloupe, NP       OLANZapine zydis (ZYPREXA) disintegrating tablet 10 mg  10 mg Oral QHS Mert Dietrick, MD   10 mg at 04/19/22 2044   traZODone (DESYREL) tablet 50 mg  50 mg Oral QHS PRN Sarita Bottom, MD   50 mg at 04/19/22 2044    PTA Medications: Medications Prior to Admission  Medication Sig Dispense Refill Last Dose   benztropine (COGENTIN) 0.5 MG tablet Take 1 tablet (0.5 mg total) by mouth 2 (two) times daily as needed for up to 30 doses for tremors (EPS). (Patient not taking: Reported on 04/08/2022) 30 tablet 0    FLUoxetine (PROZAC) 10 MG capsule Take 1 capsule (10 mg total) by mouth daily. 30 capsule 0    haloperidol (HALDOL) 10 MG tablet Take 1 tablet (10 mg total) by mouth at bedtime. 30 tablet 0    lamoTRIgine (LAMICTAL) 25 MG tablet Take 1 tablet (25 mg total) by mouth daily for 4 days. (Patient not taking: Reported on 03/02/2022) 4 tablet 0    lamoTRIgine (LAMICTAL) 25 MG tablet Take 2 tablets (50 mg total) by mouth daily for 14 days. 28 tablet 0    liothyronine (CYTOMEL) 25 MCG tablet Take 1 tablet (25 mcg total) by mouth daily. 30 tablet 0    nicotine polacrilex (NICORETTE) 2 MG gum Take 1 each (2 mg total) by mouth as needed for smoking cessation. (Patient not taking: Reported on 04/08/2022) 50 tablet 0    thiamine 100 MG tablet Take 1 tablet (100 mg total) by mouth daily. (Patient not taking: Reported on 04/08/2022)       Musculoskeletal: Strength & Muscle Tone: within normal limits Gait & Station: normal Patient leans: N/A    Psychiatric Specialty Exam:  General Appearance: appears older than stated age, fairly dressed and groomed Behavior: Guarded and paranoid, irritable and  restless at times  Psychomotor Activity: Some psychomotor retardation noted  Eye Contact: Limited Speech: Decreased  Speech Volume:decreased   Mood: Labile mood and affect noted, easily crying Affect: Labile  Thought Process: Appears less disorganized today more linear responses. Descriptions of Associations: Concrete Thought Content: Hallucinations: Denies Delusions: Paranoid, less Suicidal Thoughts: Denies Homicidal Thoughts: Denies  Alertness: Alert Orientation: Confused, limited orientation   Insight: Poor Judgment: Poor  Memory: Poor  Executive Functions  Concentration: poor  Attention Span: poor Recall: poor Fund of Knowledge:poor   Physical Exam: Physical Exam Pulmonary:     Effort: Pulmonary effort is normal.  Neurological:     General: No focal deficit present.     Mental Status: She is alert.    Review of Systems  Gastrointestinal:  Positive for constipation.  Psychiatric/Behavioral:  Positive for depression and substance abuse.    Blood pressure 127/83, pulse (!) 108, temperature 97.9 F (36.6 C), temperature source Oral, resp. rate 16, height  (1.549 m), weight 53.1 kg, last menstrual period 04/17/2022, SpO2 96 %. Body mass index is 22.11 kg/m.   Assets  Assets:Desire for Improvement; Financial Resources/Insurance; Physical Health    Treatment Plan Summary:   ASSESSMENT:  Principal Diagnosis: Schizophrenia (HCC) Diagnosis:  Principal Problem:   Schizophrenia (HCC)   PLAN: Safety and Monitoring:  -- Involuntary admission to inpatient psychiatric unit for safety, stabilization and treatment  -- Daily contact with patient to assess and evaluate symptoms and progress in treatment  -- Patient's case to be discussed in multi-disciplinary team meeting  -- Observation Level : q15 minute checks  -- Vital signs:  q12 hours  -- Precautions: suicide, elopement, and assault  2. Medications:   Continue Zyprexa Zydis 10 mg at bedtime for  mood and psychosis  Continue trazodone at bedtime as needed for sleep  Continue Vistaril as needed for anxiety  Provide lactulose once for constipation, monitor efficacy  -- The risks/benefits/side-effects/alternatives to this medication were discussed in detail with the patient and time was given for questions. The patient consents to medication trial.       3. Pertinent labs: EKG 6/8 QTc 453, CMP within normal limit, CBC within normal limits, prolactin elevated at 189, lipid profile in March 2023 indicated elevated LDL 133 and low HDL of 36 otherwise within normal limits,Urine drug screen prior to admission positive for benzos and marijuana  Internal medicine consult note indicates elevated prolactin probably related to antipsychotic medication treatment, will follow and recommend outpatient follow-up after discharge.     Lab ordered: Hemoglobin A1c 4.7   4. Tobacco Use Disorder  -- Nicotine patch 14 mg/24 hours ordered  -- Smoking cessation encouraged  5. Group and Therapy: -- Encouraged patient to participate in unit milieu and in scheduled group therapies     Patient was counseled to abstain from illicit drug use including marijuana after discharge.  -- Short Term Goals: Ability to identify changes in lifestyle to reduce recurrence of condition will improve, Ability to verbalize feelings will improve, Ability to disclose and discuss suicidal ideas, Ability to demonstrate self-control will improve, Ability to identify and develop effective coping behaviors will improve, and Ability to maintain clinical measurements within normal limits will improve  -- Long Term Goals: Improvement in symptoms so as ready for discharge  6. Discharge Planning:   -- Social work and case management to assist with discharge planning and identification of hospital follow-up needs prior to discharge  -- Estimated LOS: 5-7 days  -- Discharge Concerns: Need to establish a safety plan; Medication compliance  and effectiveness  -- Discharge Goals: Return home with  outpatient referrals for mental health follow-up including medication management/psychotherapy  Patient would benefit from ACT team referral at time of discharge to improve compliance with outpatient follow-up appointments and medications and decrease risk of decompensation given multiple ED visits and hospitalizations past few months.   The patient is agreeable with the medication plan, as above. We will monitor the patient's response to pharmacologic treatment, and adjust medications as necessary. Patient is encouraged to participate in group therapy while admitted to the psychiatric unit. We will address other chronic and acute stressors, which contributed to the patient's worsening paranoia and psychosis, in order to reduce the risk of self-harm at discharge.   Physician Treatment Plan for Primary Diagnosis: Schizophrenia (HCC) Long Term Goal(s): Improvement in symptoms so as ready for discharge  Short Term Goals: Ability to identify changes in lifestyle to reduce recurrence of condition will improve, Ability to verbalize feelings will improve, Ability to disclose and discuss suicidal ideas, Ability to demonstrate self-control will improve, and Ability to identify and develop effective coping behaviors will improve   I certify that inpatient services furnished can reasonably be expected to improve the patient's condition.    Total Time Spent in Direct Patient Care:  I personally spent 45 minutes on the unit in direct patient care. The direct patient care time included face-to-face time with the patient, reviewing the patient's chart, communicating with other professionals, and coordinating care. Greater than 50% of this time was spent in counseling or coordinating care with the patient regarding goals of hospitalization, psycho-education, and discharge planning needs.   Reah Justo Abbott Pao, MD 6/13/202312:33 PM

## 2022-04-20 NOTE — Group Note (Signed)
Recreation Therapy Group Note   Group Topic:Health and Wellness  Group Date: 04/20/2022 Start Time: 0955 End Time: 1025 Facilitators: Caroll Rancher, LRT,CTRS Location: [address] Dayroom   Goal Area(s) Addresses:  Patient will define components of whole wellness. Patient will verbalize benefit of whole wellness.  Group Description:  Exercise.  LRT and patients discussed the importance of wellness and the main elements that make it up (mental, physical and spiritual).  LRT then explained to group they would be focusing on the physical aspect with some chair exercises.  LRT led group in a series of stretches before allowing each patient to lead group in an exercise of their choosing.  The group was going for at least 30 minutes of movement.  Patients were encouraged to get water or take breaks if needed.   Affect/Mood: Sad   Participation Level: Minimal   Participation Quality: Independent   Behavior: Distracted and Tearful   Speech/Thought Process: Distracted   Insight: Poor   Judgement: Poor   Modes of Intervention: Music and Exercise   Patient Response to Interventions:  Receptive   Education Outcome:  Acknowledges education and In group clarification offered    Clinical Observations/Individualized Feedback: Pt came for the last 5 minutes of group.  Pt engaged in the exercises with prompting.  Pt kept looking out into the hallway as if looking for something/someone.  Pt couldn't completely focus due to being focused on the people in the hallway.  Pt even began to cry at one point for no reason.      Plan: Continue to engage patient in RT group sessions 2-3x/week.   Caroll Rancher, Antonietta Jewel 04/20/2022 12:31 PM

## 2022-04-20 NOTE — [facility] Counselor (Signed)
[facility] caseworker, Paris Lore, [phone], came to visit patient.  CSW provided historical information about patient and caseworker did a well being check on patient.   Caseworker also provided information that support person that patient is staying with, [name], was prohibited from visiting patient while she was at [facility] due to him giving her a pill while in the hospital.  CSW notified providers of information.    Margie Brink, LCSW, LCAS Clincal Social Worker  [facility]
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
## 2022-04-20 NOTE — [facility] Counselor (Signed)
CSW attempted to complete an assessment with patient.  Patient complained of stomach pain and wanting to go back to sleep.  CSW notified nurse and nurse said that doctor needs to give order for Miralax.  CSW notified Dr. Abbott Pao about nurse recommendations.    Tafari Humiston, LCSW, LCAS Clincal Social Worker  [facility]

## 2022-04-20 NOTE — Progress Notes (Signed)
Pt presents very anxious, restless, paranoid /suspicious this shift which increased as shift progressed. Denies SI, HI and AVH when assessed. Complain of abdominal pain related to no BM X3 days today "The medicine from yesterday didn't work". Received PRN Vistaril 25 mg PO at 0837 for anxiety 7/10 without change when reassessed. Pt's abdomen soft when palpated without distention. Assigned provider made aware. New order received for Lactulose, given and pt reports no result thus far. Inderal 10 BID initiated this shift for anxiety as ordered. Safety checks maintained at Q 15 minutes intervals. All medications given with verbal education and effects monitored. Support and encouragement offered. Pt tolerates all meals, fluids and medications well. Remains safe in milieu.

## 2022-04-20 NOTE — Progress Notes (Signed)
   04/20/22 0500  Sleep  Number of Hours 8

## 2022-04-20 NOTE — Progress Notes (Signed)
Adult Psychoeducational Group Note  Date:  04/20/2022 Time:  11:15 PM  Group Topic/Focus:  Wrap-Up Group:   The focus of this group is to help patients review their daily goal of treatment and discuss progress on daily workbooks.  Participation Level:  Did Not Attend  Participation Quality:   Did Not Attend  Affect:   Did Not Attend  Cognitive:   Did Not Attend  Insight: None  Engagement in Group:   Did Not Attend  Modes of Intervention:   Did Not Attend  Additional Comments:  Pt was encouraged to attend wrap up group but did not attend.   Felipa Furnace 04/20/2022, 11:15 PM

## 2022-04-21 DIAGNOSIS — F200 Paranoid schizophrenia: Secondary | ICD-10-CM | POA: Diagnosis not present

## 2022-04-21 MED ORDER — LACTULOSE 10 GM/15ML PO SOLN
30.0000 g | Freq: Once | ORAL | Status: AC
  Administered 2022-04-21: 30 g via ORAL
  Filled 2022-04-21: qty 45

## 2022-04-21 MED ORDER — PROPRANOLOL HCL 10 MG PO TABS
10.0000 mg | ORAL_TABLET | Freq: Three times a day (TID) | ORAL | Status: DC
  Administered 2022-04-21 – 2022-04-22 (×4): 10 mg via ORAL
  Filled 2022-04-21 (×3): qty 42
  Filled 2022-04-21 (×6): qty 1

## 2022-04-21 NOTE — [facility] Suicide Risk Assessment (Signed)
[facility] INPATIENT:  Family/Significant Other Suicide Prevention Education  Suicide Prevention Education:  Contact Attempts: Dianna Rossetti,   [phone]  (name of family member/significant other) has been identified by the patient as the family member/significant other with whom the patient will be residing, and identified as the person(s) who will aid the patient in the event of a mental health crisis.  With written consent from the patient, two attempts were made to provide suicide prevention education, prior to and/or following the patient's discharge.  We were unsuccessful in providing suicide prevention education.  A suicide education pamphlet was given to the patient to share with family/significant other.  Date and time of first attempt:6/13 @ 2pm Date and time of second attempt:6/14 @ 10:30pm  CSW attempted to call and they said that the number was not in service.  CSW let provider know that CSW unable to contact.   Gabby Rackers E Philisha Weinel 04/21/2022, 10:25 AM

## 2022-04-21 NOTE — Progress Notes (Signed)
   04/21/22 0554  Note  Observations 8

## 2022-04-21 NOTE — Progress Notes (Signed)
Pt A & O to self, place and events. Presents less anxious, more cooperative with care and unit routines this shift. Denies SI, HI, AVH and pain. Reports 1 BM this shift. Stated she slept well with good appetite. Visible in dayroom at brief intervals but not for scheduled groups. Expressed her excitement about possible d/c tomorrow.   Safety checks maintained at Q 15 minutes intervals. Emotional support, reassurance and encouragement provided to pt. All medications administered with verbal education and effects monitored. Pt tolerates all meals, fluids and medications well. Remains verbally redirectable at this time.

## 2022-04-21 NOTE — Group Note (Signed)
Recreation Therapy Group Note   Group Topic:Communication  Group Date: 04/21/2022 Start Time: 1000 End Time: 1040 Facilitators: Caroll Rancher, LRT,CTRS Location: [address] Dayroom   Goal Area(s) Addresses:  Patient will effectively listen to complete activity.  Patient will identify communication skills used to make activity successful.  Patient will identify how skills used during activity can be used to reach post d/c goals.    Group Description:  Geometric Drawings.  Three volunteers from the peer group will be shown an abstract picture with a particular arrangement of geometrical shapes.  Each round, one 'speaker' will describe the pattern, as accurately as possible without revealing the image to the group.  The remaining group members will listen and draw the picture to reflect how it is described to them. Patients with the role of 'listener' cannot ask clarifying questions but, may request that the speaker repeat a direction. Once the drawings are complete, the presenter will show the rest of the group the picture and compare how close each person came to drawing the picture. LRT will facilitate a post-activity discussion regarding effective communication and the importance of planning, listening, and asking for clarification in daily interactions with others.   Affect/Mood: N/A   Participation Level: Did not attend    Clinical Observations/Individualized Feedback:     Plan: Continue to engage patient in RT group sessions 2-3x/week.   Caroll Rancher, LRT,CTRS 04/21/2022 2:01 PM

## 2022-04-21 NOTE — Progress Notes (Signed)
   04/21/22 0200  Psych Admission Type (Psych Patients Only)  Admission Status Involuntary  Psychosocial Assessment  Patient Complaints Anxiety  Eye Contact Fair  Facial Expression Anxious  Affect Anxious  Speech Soft;Slow;Tangential  Interaction Cautious;Minimal  Motor Activity Restless  Appearance/Hygiene Unremarkable  Behavior Characteristics Cooperative  Mood Anxious  Aggressive Behavior  Effect No apparent injury  Thought Process  Coherency Tangential  Content Preoccupation  Delusions None reported or observed  Perception WDL  Hallucination None reported or observed  Judgment Impaired  Confusion Mild  Danger to Self  Current suicidal ideation? Denies  Danger to Others  Danger to Others None reported or observed

## 2022-04-21 NOTE — Group Note (Signed)
LCSW Group Therapy Note   Group Date: 04/21/2022 Start Time: 1300 End Time: 1400   Type of Therapy and Topic:  Group Therapy: Problem Solving   Participation Level:  Did not attend  Description of Group:  Patients identified different skills needed to problem solve.  Patients were able to identify a problem they were having and identify steps into solving that problem including 1. Identify the problem, 2. Generating possible solutions, 3. Evaluating alternatives, 4. Decide on a solution, 5 Implement the solution and 6 Evaluate the outcome.  Patients demonstrated understanding by participating in discussion and assisting peers with solving problems.    Therapeutic Goals:  1. Identify problem solving skills 2. Demonstrate understanding by identifying problem and going through steps of problem solving.     Summary of Patient Progress:   Did not attend  Therapeutic Modalities:   Zachery Conch, LCSW 04/21/2022  1:48 PM

## 2022-04-21 NOTE — Progress Notes (Signed)
   04/21/22 2000  Psych Admission Type (Psych Patients Only)  Admission Status Involuntary  Psychosocial Assessment  Patient Complaints Anxiety  Eye Contact Fair  Facial Expression Anxious  Affect Anxious  Speech Soft;Slow;Tangential  Interaction Cautious;Minimal  Motor Activity Restless  Appearance/Hygiene Unremarkable  Behavior Characteristics Cooperative  Mood Anxious  Aggressive Behavior  Effect No apparent injury  Thought Process  Coherency Tangential  Content Preoccupation  Delusions None reported or observed  Perception WDL  Hallucination None reported or observed  Judgment Impaired  Confusion Mild  Danger to Self  Current suicidal ideation? Denies  Danger to Others  Danger to Others None reported or observed

## 2022-04-21 NOTE — Progress Notes (Signed)
Psychiatric inpatient progress note  Patient Identification: Barbara Gordon MRN:  [id number] Date of Evaluation:  04/21/2022  Chief Complaint:  Schizophrenia (HCC) [F20.9]  History of Present Illness:  Barbara Formmie Recendiz is a 31 y.o., female with a past psychiatric history significant for schizophrenia, substance use disorder and substance-induced psychosis who presents to the [facility] from [facility] emergency department for evaluation and management of worsening confusion and agitation.  According to outside records, the patient was admitted primarily on 6/2 for altered mental status to medical floor, psych consult on 6/5 indicates psychosis and paranoia, confusion after exposure to illicit substances and was recommended for admission after medically cleared.  During interview today 04/18/2022 on the inpatient unit, the patient presents disorganized guarded psychotic and paranoid unable to provide linear detailed history information responding to a lot of questions "I am not sure I do not know" she tells me that she has diagnoses of borderline disorder but unable to give any further details she is unsure if she had previous psychiatric hospitalizations but denies any previous suicide attempts she tells me she tried psychiatric medications before but unable to name them and unable to confirm if she was on them at home.  She does report good sleep denies paranoia but tells me that she is afraid that somebody is about to harm her child and he earlier told staff that she needed to go to her room because somebody is molesting her children and here, she denies to me any SI HI or AVH, presents with thought blocking paranoid does not appear responding to stimuli, no self-injurious or aggressive behavior noted.  She denies feeling depressed but presents emotional and crying she denies symptoms consistent with mania or hypomania or PTSD.  04/19/2022 Staff report patient was easily distracted  yesterday paranoid and required a lot of redirection received her medications.  Upon evaluation this morning patient is lying down in bed reports good sleep last night but feeling tired this morning probably secondary to medications, trazodone was given last night for sleep in addition to Zyprexa night dose.  She continues to be vague in her responses unable to tell me why she came to the hospital were reviewed with her reason for admission per Chart she responds "I guess" she denies to me SI HI or AVH she does present regarding with questionable paranoia seems anxious and fearful, no self-injurious or aggressive behavior noted.  Denies side effects to current medication regimen except for feeling tired this morning. We will continue current medications the same except titrate Zyprexa to 10 mg at bedtime and monitor efficacy and safety.  Addendum: came to reevaluate patient this afternoon, she is lying down in bed, notes feeling tired, abel to answer some questions in linear manner, tells me she has her 410 yo son lives with his paternal grandmother but she doesn't see him, notes no paranoia from staff or peers in here, d/w pt elevated prolactin level, she denies galactorrhea currently or in the past. Preg test negative. D/w pt goals of treatment before dc home, she agrees at this time to cont compliance with meds after dc but vague regard obstacles to compliance previously.  04/20/2022 Patient was evaluated and chart was reviewed no as needed medication needed and given for agitation or aggression.  Zyprexa dose was titrated up last night patient reports good sleep and reports feeling tired this morning she continues to have limited interaction with staff but upon evaluation this morning she is apparently much more linear in her  responses no further paranoia noted does not scared or fearful as upon admission.  No self injures or aggressive behavior noted does not be responding to stimuli, denies SI HI or AVH  asking for discharge home, discussed with patient goals of participation in groups as well as compliance with medication and ongoing interaction with the staff as goals prior to discharge, she agrees.  She denies and does not display any signs of side effects to current medication regimen.  She was counseled again today regarding need to abstain from illicit drug use including marijuana after discharge.  She continues to present with restricted abdomen which seems to be her baseline, appears calm does not appear anxious any longer.  04/21/2022 Patient was evaluated and chart was reviewed, yesterday Vistaril was switched to propranolol to address patient's anxiety was getting worse through the day.  Upon evaluation today patient reports good sleep and good appetite, continues to deny SI HI or AVH denies paranoia or other delusions, does not present she does admit to feeling anxious yesterday later of the day and notes it is improved with medication given/propranolol vital signs seems stable and I discussed with her I will titrate the dose to 3 times daily for better efficacy, she agrees.  Patient was counseled repeatedly regarding need to abstain from illicit drug use including marijuana after discharge and she agrees, she is able to identify risk of getting worse and "coming back to the hospital" triggered by using street drugs and noncompliance with medications.  She denies side effect to medications while in the hospital and agrees to comply after discharge.  If continues to do well today plan to discharge home tomorrow with outpatient follow-up and referral to ACT team to improve compliance.  Upon evaluation today she presents calm, no anxiety noted remains with restricted affect, able to walk in the room with a stable gait, no anxiety noted.   Chart review: Consult note during evaluation on 6/4 indicates diagnosis of brief psychotic disorder, cannabis use with psychosis, medications included Haldol,  Cogentin, Saphris, Lamictal, Ativan, urine drug screen was positive for benzos and marijuana.  Previous notes from emergency room April 2023 indicates history of dystonic reaction secondary to Haldol during previous treatment.  ED visit in March 2023 indicates diagnosis was brief psychotic disorder at that time, emergency room visit in February 2023 indicates altered mental status. Admission MD see note from medical floor prior to this admission as well as psychiatric consult notes were reviewed, patient was noted to have elevated prolactin level which is new compared to previous level obtained earlier this year. MRI brain was completed while patient was admitted for stroke and neurology was consulted and noted abnormalities probably related to TBI with no further work-up recommended. Further chart review indicates admission in March 2023 For substance-induced psychosis and was discharged on Cogentin Prozac Haldol and Lamictal.  History of TBI with 1 incident of seizure noted per chart review  Psych meds prior to admission: Haldol, Ativan, Cogentin and Saphris     Collateral information: Spoke to patient's boyfriend Ladona Ridgel who she lives with, admits to patient using marijuana prior to admission, notes on and off compliance with outpatient medication secondary to financial stressors, he does also admit to patient having trouble making your outpatient follow-up appointment secondary to lack of transportation.  Past Psychiatric History:  Prior Psychiatric diagnoses: Schizophrenia, substance use disorder and substance-induced psychosis Past Psychiatric Hospitalizations: Multiple ED visits for altered mental status and psychosis  History of self mutilation: None noted  Past suicide attempts: Patient denies Past history of HI, violent or aggressive behavior: Denies  Past Psychiatric medications trials: Per chart review Haldol with history of dystonic reaction, history of treatment with Saphris Ativan  and Cogentin History of ECT/TMS: None noted  Outpatient psychiatric Follow up: Unknown Prior Outpatient Therapy: Unknown    Is the patient at risk to self? Yes.    Has the patient been a risk to self in the past 6 months? No.  Has the patient been a risk to self within the distant past? No.  Is the patient a risk to others? No.  Has the patient been a risk to others in the past 6 months? No.  Has the patient been a risk to others within the distant past? No.    Substance Use History: Alcohol: Denied Tobacoo: 1 pack daily Marijuana: Admits to marijuana use daily Cocaine: Denies Stimulants: Denies IV drug use: Denies Opiates: Denies Prescribed Meds abuse: Denies H/O withdrawals, blackouts, DTs: Denies History of Detox / Rehab: Denies DUI: Denies  Alcohol Screening: 1. How often do you have a drink containing alcohol?: Never 2. How many drinks containing alcohol do you have on a typical day when you are drinking?: 1 or 2 3. How often do you have six or more drinks on one occasion?: Never AUDIT-C Score: 0 4. How often during the last year have you found that you were not able to stop drinking once you had started?: Never 5. How often during the last year have you failed to do what was normally expected from you because of drinking?: Never 6. How often during the last year have you needed a first drink in the morning to get yourself going after a heavy drinking session?: Never 7. How often during the last year have you had a feeling of guilt of remorse after drinking?: Never 8. How often during the last year have you been unable to remember what happened the night before because you had been drinking?: Never 9. Have you or someone else been injured as a result of your drinking?: No 10. Has a relative or friend or a doctor or another health worker been concerned about your drinking or suggested you cut down?: No Alcohol Use Disorder Identification Test Final Score (AUDIT):  0 Substance Abuse History in the last 12 months:  Yes.      Past Medical/Surgical History:  Past Medical History:  Diagnosis Date   Hypothyroidism    Polysubstance abuse (HCC)    Schizophrenia (HCC)    History reviewed. No pertinent surgical history.  Family History: History reviewed. No pertinent family history.  Family Psychiatric History:  Psychiatric illness: Denies Suicide: Denies Substance Abuse: Denies  Social History:  Social History   Substance and Sexual Activity  Alcohol Use Not Currently     Social History   Substance and Sexual Activity  Drug Use Not Currently   Types: Cocaine, Amphetamines, Methamphetamines, Marijuana    Living situation: Lives with her boyfriend of 6 months and his mother Social support: Limited Marital Status: Never married Children: 29 years old son.  Patient lives with his grandma Education: Unknown Employment: Immunologist: None Legal history: Denies pending charges or court dates Trauma: Patient denies Access to guns: Denies   Allergies:   Allergies  Allergen Reactions   Latex Itching and Rash   Tramadol Itching and Other (See Comments)    Provider: Crist Fat CFM - Allergy Description: TraMADol HCl *ANALGESICS - OPIOID* CFM - Allergy Annotation: Pruritus.  Lab Results:  No results found for this or any previous visit (from the past 48 hour(s)).   Blood Alcohol level:  Lab Results  Component Value Date   ETH <10 04/10/2022   ETH <10 04/08/2022    Metabolic Disorder Labs:  Lab Results  Component Value Date   HGBA1C 4.7 (L) 04/18/2022   MPG 88.19 04/18/2022   MPG 97 01/15/2022   Lab Results  Component Value Date   PROLACTIN 189.0 (H) 04/16/2022   PROLACTIN 2.6 (L) 01/15/2022   Lab Results  Component Value Date   CHOL 171 01/15/2022   TRIG 10 01/15/2022   HDL 36 (L) 01/15/2022   CHOLHDL 4.8 01/15/2022   VLDL 2 01/15/2022   LDLCALC 133 (H) 01/15/2022   LDLCALC 131 (H) 10/09/2021     Current Medications: Current Facility-Administered Medications  Medication Dose Route Frequency Provider Last Rate Last Admin   acetaminophen (TYLENOL) tablet 650 mg  650 mg Oral Q6H PRN Sindy Guadeloupe, NP   650 mg at 04/18/22 1840   alum & mag hydroxide-simeth (MAALOX/MYLANTA) 200-200-20 MG/5ML suspension 30 mL  30 mL Oral Q4H PRN Sindy Guadeloupe, NP       magnesium hydroxide (MILK OF MAGNESIA) suspension 30 mL  30 mL Oral Daily PRN Sindy Guadeloupe, NP   30 mL at 04/19/22 0842   nicotine (NICODERM CQ - dosed in mg/24 hours) patch 14 mg  14 mg Transdermal Daily Abbott Pao, Denny Lave, MD   14 mg at 04/20/22 2836   nicotine polacrilex (NICORETTE) gum 2 mg  2 mg Oral PRN Sindy Guadeloupe, NP       OLANZapine zydis (ZYPREXA) disintegrating tablet 10 mg  10 mg Oral QHS Abbott Pao, Jafeth Mustin, MD   10 mg at 04/20/22 2056   propranolol (INDERAL) tablet 10 mg  10 mg Oral TID Sarita Bottom, MD       traZODone (DESYREL) tablet 50 mg  50 mg Oral QHS PRN Sarita Bottom, MD   50 mg at 04/20/22 2056    PTA Medications: Medications Prior to Admission  Medication Sig Dispense Refill Last Dose   benztropine (COGENTIN) 0.5 MG tablet Take 1 tablet (0.5 mg total) by mouth 2 (two) times daily as needed for up to 30 doses for tremors (EPS). (Patient not taking: Reported on 04/08/2022) 30 tablet 0    FLUoxetine (PROZAC) 10 MG capsule Take 1 capsule (10 mg total) by mouth daily. 30 capsule 0    haloperidol (HALDOL) 10 MG tablet Take 1 tablet (10 mg total) by mouth at bedtime. 30 tablet 0    lamoTRIgine (LAMICTAL) 25 MG tablet Take 1 tablet (25 mg total) by mouth daily for 4 days. (Patient not taking: Reported on 03/02/2022) 4 tablet 0    lamoTRIgine (LAMICTAL) 25 MG tablet Take 2 tablets (50 mg total) by mouth daily for 14 days. 28 tablet 0    liothyronine (CYTOMEL) 25 MCG tablet Take 1 tablet (25 mcg total) by mouth daily. 30 tablet 0    nicotine polacrilex (NICORETTE) 2 MG gum Take 1 each (2 mg total) by mouth as needed for smoking  cessation. (Patient not taking: Reported on 04/08/2022) 50 tablet 0    thiamine 100 MG tablet Take 1 tablet (100 mg total) by mouth daily. (Patient not taking: Reported on 04/08/2022)       Musculoskeletal: Strength & Muscle Tone: within normal limits Gait & Station: normal Patient leans: N/A    Psychiatric Specialty Exam:  General Appearance: appears older than stated age, fairly dressed and  groomed Behavior: Calm, cooperative yes concrete  Psychomotor Activity: Some psychomotor retardation noted  Eye Contact: Limited Speech: Decreased Speech Volume:decreased   Mood: Euthymic Affect: Restricted  Thought Process: More organized with linear responses, no disorganized thought process noted Descriptions of Associations: Concrete Thought Content: Hallucinations: Denies Delusions: No paranoia noted, improved Suicidal Thoughts: Denies Homicidal Thoughts: Denies  Alertness: Alert Orientation: Oriented x3   Insight: Improved yet limited Judgment: Improved yet limited  Memory: Poor  Executive Functions  Concentration: poor  Attention Span: poor Recall: poor Fund of Knowledge:poor   Physical Exam: Physical Exam Pulmonary:     Effort: Pulmonary effort is normal.  Neurological:     General: No focal deficit present.     Mental Status: She is alert.    Review of Systems  Psychiatric/Behavioral:  Positive for depression and substance abuse.    Blood pressure (!) 120/91, pulse 98, temperature 98 F (36.7 C), temperature source Oral, resp. rate 16, height  (1.549 m), weight 53.1 kg, last menstrual period 04/17/2022, SpO2 99 %. Body mass index is 22.11 kg/m.   Assets  Assets:Desire for Improvement; Financial Resources/Insurance; Physical Health    Treatment Plan Summary:   ASSESSMENT:  Principal Diagnosis: Schizophrenia (HCC) Diagnosis:  Principal Problem:   Schizophrenia (HCC)   PLAN: Safety and Monitoring:  -- Involuntary admission to inpatient  psychiatric unit for safety, stabilization and treatment  -- Daily contact with patient to assess and evaluate symptoms and progress in treatment  -- Patient's case to be discussed in multi-disciplinary team meeting  -- Observation Level : q15 minute checks  -- Vital signs:  q12 hours  -- Precautions: suicide, elopement, and assault  2. Medications:   Continue Zyprexa Zydis 10 mg at bedtime for mood and psychosis  Continue trazodone at bedtime as needed for sleep  Vistaril was discontinued for likely frequency  Continue propranolol and titrate to 10 mg 3 times daily to address anxiety, monitor efficacy and Safety  Provide lactulose once for constipation, monitor efficacy  -- The risks/benefits/side-effects/alternatives to this medication were discussed in detail with the patient and time was given for questions. The patient consents to medication trial.       3. Pertinent labs: EKG 6/8 QTc 453, CMP within normal limit, CBC within normal limits, prolactin elevated at 189, lipid profile in March 2023 indicated elevated LDL 133 and low HDL of 36 otherwise within normal limits,Urine drug screen prior to admission positive for benzos and marijuana  Internal medicine consult note indicates elevated prolactin probably related to antipsychotic medication treatment, will follow and recommend outpatient follow-up after discharge.     Lab ordered: Hemoglobin A1c 4.7   4. Tobacco Use Disorder  -- Nicotine patch 14 mg/24 hours ordered  -- Smoking cessation encouraged  5. Group and Therapy: -- Encouraged patient to participate in unit milieu and in scheduled group therapies     Patient was counseled to abstain from illicit drug use including marijuana after discharge.  -- Short Term Goals: Ability to identify changes in lifestyle to reduce recurrence of condition will improve, Ability to verbalize feelings will improve, Ability to disclose and discuss suicidal ideas, Ability to demonstrate  self-control will improve, Ability to identify and develop effective coping behaviors will improve, and Ability to maintain clinical measurements within normal limits will improve  -- Long Term Goals: Improvement in symptoms so as ready for discharge  6. Discharge Planning:   -- Social work and case management to assist with discharge planning and identification of  hospital follow-up needs prior to discharge  -- Estimated LOS: 5-7 days  -- Discharge Concerns: Need to establish a safety plan; Medication compliance and effectiveness  -- Discharge Goals: Return home with outpatient referrals for mental health follow-up including medication management/psychotherapy  Patient would benefit from ACT team referral at time of discharge to improve compliance with outpatient follow-up appointments and medications and decrease risk of decompensation given multiple ED visits and hospitalizations past few months.   The patient is agreeable with the medication plan, as above. We will monitor the patient's response to pharmacologic treatment, and adjust medications as necessary. Patient is encouraged to participate in group therapy while admitted to the psychiatric unit. We will address other chronic and acute stressors, which contributed to the patient's worsening paranoia and psychosis, in order to reduce the risk of self-harm at discharge.   Physician Treatment Plan for Primary Diagnosis: Schizophrenia (HCC) Long Term Goal(s): Improvement in symptoms so as ready for discharge  Short Term Goals: Ability to identify changes in lifestyle to reduce recurrence of condition will improve, Ability to verbalize feelings will improve, Ability to disclose and discuss suicidal ideas, Ability to demonstrate self-control will improve, and Ability to identify and develop effective coping behaviors will improve   I certify that inpatient services furnished can reasonably be expected to improve the patient's condition.     Total Time Spent in Direct Patient Care:  I personally spent 45 minutes on the unit in direct patient care. The direct patient care time included face-to-face time with the patient, reviewing the patient's chart, communicating with other professionals, and coordinating care. Greater than 50% of this time was spent in counseling or coordinating care with the patient regarding goals of hospitalization, psycho-education, and discharge planning needs.   Stepheny Canal Abbott Pao, MD 6/14/202311:30 AM

## 2022-04-22 DIAGNOSIS — F200 Paranoid schizophrenia: Secondary | ICD-10-CM | POA: Diagnosis not present

## 2022-04-22 MED ORDER — NICOTINE POLACRILEX 2 MG MT GUM: 2.0000 mg | CHEWING_GUM | OROMUCOSAL | 0 refills | Status: DC | PRN

## 2022-04-22 MED ORDER — PROPRANOLOL HCL 10 MG PO TABS: 10.0000 mg | ORAL_TABLET | Freq: Three times a day (TID) | ORAL | 0 refills | Status: DC

## 2022-04-22 MED ORDER — OLANZAPINE 10 MG PO TABS: 10.0000 mg | ORAL_TABLET | Freq: Every day | ORAL | Status: DC

## 2022-04-22 MED ORDER — OLANZAPINE 10 MG PO TABS: 10.0000 mg | ORAL_TABLET | Freq: Every day | ORAL | 0 refills | Status: DC

## 2022-04-22 MED ORDER — OLANZAPINE 10 MG PO TABS
10.0000 mg | ORAL_TABLET | Freq: Every day | ORAL | Status: DC
Start: 2022-04-22 — End: 2022-04-22
  Filled 2022-04-22 (×2): qty 14

## 2022-04-22 MED ORDER — TRAZODONE HCL 50 MG PO TABS: 50.0000 mg | ORAL_TABLET | Freq: Every evening | ORAL | 0 refills | Status: DC | PRN

## 2022-04-22 NOTE — Progress Notes (Signed)
Pt discharged to lobby. Pt was stable and appreciative at that time. All papers, samples and prescriptions were given and valuables returned. Verbal understanding expressed. Denies SI/HI and A/VH. Pt given opportunity to express concerns and ask questions.  

## 2022-04-22 NOTE — Group Note (Signed)
Recreation Therapy Group Note   Group Topic:Team Building  Group Date: 04/22/2022 Start Time: 1000 End Time: 1020 Facilitators: Caroll Rancher, LRT,CTRS Location: [address] Dayroom   Goal Area(s) Addresses:  Patient will effectively work with peer towards shared goal.  Patient will identify skills used to make activity successful.  Patient will identify how skills used during activity can be used to reach post d/c goals.   Group Description:  Straw Bridge. In teams of 3-5, patients were given 15 plastic drinking straws and an equal length of masking tape. Using the materials provided, patients were instructed to build a free standing bridge-like structure to suspend an everyday item (ex: puzzle box) off of the floor or table surface. All materials were required to be used by the team in their design. LRT facilitated post-activity discussion reviewing team process. Patients were encouraged to reflect how the skills used in this activity can be generalized to daily life post discharge.   Affect/Mood: N/A   Participation Level: Did not attend    Clinical Observations/Individualized Feedback:     Plan: Continue to engage patient in RT group sessions 2-3x/week.   Caroll Rancher, LRT,CTRS 04/22/2022 1:00 PM

## 2022-04-22 NOTE — [facility] Suicide Risk Assessment (Signed)
[facility] INPATIENT:  Family/Significant Other Suicide Prevention Education  Suicide Prevention Education:  Education Completed; Dianna Rossetti, friend, [phone] (name of family member/significant other) has been identified by the patient as the family member/significant other with whom the patient will be residing, and identified as the person(s) who will aid the patient in the event of a mental health crisis (suicidal ideations/suicide attempt).  With written consent from the patient, the family member/significant other has been provided the following suicide prevention education, prior to the and/or following the discharge of the patient.  [name] agreed that patient is at baseline and no additional safety concerns.   The suicide prevention education provided includes the following: Suicide risk factors Suicide prevention and interventions National Suicide Hotline telephone number [facility] assessment telephone number [facility] Emergency Assistance 911 [facility] and/or Residential Mobile Crisis Unit telephone number  Request made of family/significant other to: Remove weapons (e.g., guns, rifles, knives), all items previously/currently identified as safety concern.   Remove drugs/medications (over-the-counter, prescriptions, illicit drugs), all items previously/currently identified as a safety concern.  The family member/significant other verbalizes understanding of the suicide prevention education information provided.  The family member/significant other agrees to remove the items of safety concern listed above.  Jenetta Wease E Elycia Woodside 04/22/2022, 9:50 AM

## 2022-04-22 NOTE — Plan of Care (Signed)
Patient attended 3 recreational therapy group sessions but was reserved, withdrawn and tearful during sessions.  Patient had no pro-social interaction with peers or staff.   Caroll Rancher, LRT,CTRS

## 2022-04-22 NOTE — Progress Notes (Signed)
  [facility] Adult Case Management Discharge Plan :  Will you be returning to the same living situation after discharge:  Yes,  Back to friend's house [place].  At discharge, do you have transportation home?: Yes,  [name] confirmed pick up at [employer].  Do you have the ability to pay for your medications: Yes,  CSW provided resources to receive services at [facility]  for continued [place] and a Good RX card was provided to save money on medication cost.   Release of information consent forms completed and in the chart;  Patient's signature needed at discharge.  Patient to Follow up at:  Follow-up Information     [place] Follow up.   Specialty: Professional Counselor Why: You may go to this provider for therapy and medication management services during walk in hours for new patients:  Monday through Friday, from 9:00 am to 1:00 pm. Contact information: [address] [place] [address] [phone]         [facility] Call.   Why: A referral for services for their ACTT has been done on your behalf.  They currently have a 3 week wating period to get linked with services.  Please follow up with this provider to get connected. Contact information: [address] [place] [address] [phone]                 Next level of care provider has access to [facility] Link:No  Safety Planning and Suicide Prevention discussed: Yes,  [name]     Has patient been referred to the Quitline?: Yes, faxed on 04/22/2022.   Patient has been referred for addiction treatment: Yes with [facility] [place] and ACTT referral.   Kilyn Maragh E Crandall Harvel, LCSW 04/22/2022, 9:45 AM

## 2022-04-22 NOTE — Progress Notes (Signed)
   04/22/22 0530  Sleep  Number of Hours 9.25    

## 2022-04-22 NOTE — Discharge Summary (Addendum)
Physician Discharge Summary Note  Patient:  Barbara Gordon is an 31 y.o., female MRN:  [id number] DOB:  1991-10-02 Patient phone:  [phone] (home)  Patient address:   [address] [place] [address],  Total Time spent with patient: 45 minutes  Date of Admission:  04/17/2022 Date of Discharge: 04/22/2022  Reason for Admission:  Barbara Gordon is a 34 y.o., female with a past psychiatric history significant for schizophrenia, substance use disorder and substance-induced psychosis who presents to the [facility] from [facility] emergency department for evaluation and management of worsening confusion and agitation.  According to outside records, the patient was admitted primarily on 6/2 for altered mental status to medical floor, psych consult on 6/5 indicates psychosis and paranoia, confusion after exposure to illicit substances and was recommended for admission after medically cleared.  Principal Problem: Schizophrenia, paranoid, chronic (HCC) Discharge Diagnoses: Principal Problem:   Schizophrenia, paranoid, chronic (HCC)   Past Psychiatric History: Prior Psychiatric diagnoses: Schizophrenia, substance use disorder and substance-induced psychosis Past Psychiatric Hospitalizations: Multiple ED visits for altered mental status and psychosis   History of self mutilation: None noted Past suicide attempts: Patient denies Past history of HI, violent or aggressive behavior: Denies   Past Psychiatric medications trials: Per chart review Haldol with history of dystonic reaction, history of treatment with Saphris Ativan and Cogentin History of ECT/TMS: None noted   Outpatient psychiatric Follow up: Unknown Prior Outpatient Therapy: Unknown  Past Medical History:  Past Medical History:  Diagnosis Date   Hypothyroidism    Polysubstance abuse (HCC)    Schizophrenia (HCC)    History reviewed. No pertinent surgical history. Family History: History reviewed.  No pertinent family history. Family Psychiatric  History: Psychiatric illness: Denies Suicide: Denies Substance Abuse: Denies Social History:  Social History   Substance and Sexual Activity  Alcohol Use Not Currently     Social History   Substance and Sexual Activity  Drug Use Not Currently   Types: Cocaine, Amphetamines, Methamphetamines, Marijuana    Social History   Socioeconomic History   Marital status: Single    Spouse name: Not on file   Number of children: Not on file   Years of education: Not on file   Highest education level: Not on file  Occupational History   Not on file  Tobacco Use   Smoking status: Some Days    Types: Cigarettes   Smokeless tobacco: Never  Vaping Use   Vaping Use: Never used  Substance and Sexual Activity   Alcohol use: Not Currently   Drug use: Not Currently    Types: Cocaine, Amphetamines, Methamphetamines, Marijuana   Sexual activity: Never  Other Topics Concern   Not on file  Social History Narrative   Not on file   Social Determinants of Health   Financial Resource Strain: Not on file  Food Insecurity: Not on file  Transportation Needs: Not on file  Physical Activity: Not on file  Stress: Not on file  Social Connections: Not on file    Hospital Course:  During the patient's hospitalization, patient had extensive initial psychiatric evaluation, and follow-up psychiatric evaluations every day.   Psychiatric diagnoses provided upon initial assessment: chronic schizophrenia, Marijuana use disorder   Patient's psychiatric medications were adjusted on admission: Start Zyprexa 2.5 mg in the morning and 5 mg at bedtime for mood and psychosis             Start trazodone at bedtime as needed for sleep  Start Vistaril as needed for anxiety   During the hospitalization, other adjustments were made to the patient's psychiatric medication regimen: zyprexa was titrated to 10 mg at bedtime, trazodone was used nightly and  helped for sleep, vistaril was switched to propranolol for anxiety, was helpful and well tolerated.   Patient's care was discussed during the interdisciplinary team meeting every day during the hospitalization.   The patient denied having side effects to prescribed psychiatric medication.   Gradually, patient started adjusting to milieu. The patient was evaluated each day by a clinical provider to ascertain response to treatment. Improvement was noted by the patient's report of decreasing symptoms, improved sleep and appetite, affect, medication tolerance, behavior, and participation in unit programming.  Patient was asked each day to complete a self inventory noting mood, mental status, pain, new symptoms, anxiety and concerns.     Symptoms were reported as significantly decreased or resolved completely by discharge.    On day of discharge, the patient reports that their mood is stable. The patient denied having suicidal thoughts for more than 48 hours prior to discharge.  Patient denies having homicidal thoughts.  Patient denies having auditory hallucinations.  Patient denies any visual hallucinations or other symptoms of psychosis. The patient was motivated to continue taking medication with a goal of continued improvement in mental health.    The patient reports their target psychiatric symptoms of paranoia, psyhcosis and anxiety responded well to the psychiatric medications, and the patient reports overall benefit other psychiatric hospitalization. Supportive psychotherapy was provided to the patient. The patient also participated in regular group therapy while hospitalized. Coping skills, problem solving as well as relaxation therapies were also part of the unit programming.   Labs were reviewed with the patient, and abnormal results were discussed with the patient.   The patient is able to verbalize their individual safety plan to this provider.   # It is recommended to the patient to  continue psychiatric medications as prescribed, after discharge from the hospital.     # It is recommended to the patient to follow up with your outpatient psychiatric provider and PCP.   # It was discussed with the patient, the impact of alcohol, drugs, tobacco have been there overall psychiatric and medical wellbeing, and total abstinence from substance use was recommended the patient.ed.   # Prescriptions provided or sent directly to preferred pharmacy at discharge. Patient agreeable to plan. Given opportunity to ask questions. Appears to feel comfortable with discharge.    # In the event of worsening symptoms, the patient is instructed to call the crisis hotline, 911 and or go to the nearest ED for appropriate evaluation and treatment of symptoms. To follow-up with primary care provider for other medical issues, concerns and or health care needs   # Patient was discharged home with friend with a plan to follow up as noted below.    Behavioral Events: none Restraints: none   Groups:attended and participated   Medications Changes: as above   On day of d/c patient presented pleasant and cooperative, calm, denied sihi or avh, motivated for dc, able to discuss triggers for increased psychosis and anxiety related top using marijuana and non compliance with psych meds, agrees to abstain from marijuana after dc, also agrees to comply with FU appts and meds after dc.   D/C Medications: as above Patient was provided with 30 days script as well as 14 days free meds to improve compliance  Physical Findings: AIMS: Facial and Oral Movements  Muscles of Facial Expression: None, normal Lips and Perioral Area: None, normal Jaw: None, normal Tongue: None, normal,Extremity Movements Upper (arms, wrists, hands, fingers): None, normal Lower (legs, knees, ankles, toes): None, normal, Trunk Movements Neck, shoulders, hips: None, normal, Overall Severity Severity of abnormal movements (highest score from  questions above): None, normal Incapacitation due to abnormal movements: None, normal Patient's awareness of abnormal movements (rate only patient's report): No Awareness, Dental Status Current problems with teeth and/or dentures?: No Does patient usually wear dentures?: No  CIWA:    COWS:     Musculoskeletal: Strength & Muscle Tone: within normal limits Gait & Station: normal Patient leans: N/A   Psychiatric Specialty Exam:  General Appearance: appears at stated age, fairly dressed and groomed,    Behavior: pleasant and cooperative   Psychomotor Activity:No psychomotor agitation or retardation noted    Eye Contact: fair Speech: normal Speech Volume: normal     Mood: euthymic Affect: restricted   Thought Process: linear, goal directed  Descriptions of Associations: intact Thought Content: Hallucinations: denies AH, VH  Delusions: denies Paranoia  Suicidal Thoughts: denies SI, intention, plan  Homicidal Thoughts: denies HI, intention, plan    Alertness: alert Orientation: Fully oriented     Insight: improved, fair Judgment: improved, fair   Memory: intact   Executive Functions  Concentration: intact Attention Span: Fair Recall: intact Fund of Knowledge: fair  Assets  Assets:Desire for Improvement; Financial Resources/Insurance; Physical Health   Sleep  Sleep:No data recorded   Physical Exam: Physical Exam Constitutional:      Appearance: Normal appearance.  HENT:     Head: Normocephalic and atraumatic.  Pulmonary:     Effort: Pulmonary effort is normal.  Skin:    General: Skin is warm and dry.  Neurological:     General: No focal deficit present.     Mental Status: She is alert and oriented to person, place, and time.  Psychiatric:        Mood and Affect: Mood normal.        Behavior: Behavior normal.        Thought Content: Thought content normal.        Judgment: Judgment normal.    ROS Blood pressure 131/90, pulse 89, temperature  97.7 F (36.5 C), temperature source Oral, resp. rate 16, height 5\' 1"  (1.549 m), weight 53.1 kg, last menstrual period 04/17/2022, SpO2 95 %. Body mass index is 22.11 kg/m.   Social History   Tobacco Use  Smoking Status Some Days   Types: Cigarettes  Smokeless Tobacco Never   Tobacco Cessation:  Prescription not provided because: Was discharged on Nicorette gum OTC   Blood Alcohol level:  Lab Results  Component Value Date   ETH <10 04/10/2022   ETH <10 04/08/2022    Metabolic Disorder Labs:  Lab Results  Component Value Date   HGBA1C 4.7 (L) 04/18/2022   MPG 88.19 04/18/2022   MPG 97 01/15/2022   Lab Results  Component Value Date   PROLACTIN 189.0 (H) 04/16/2022   PROLACTIN 2.6 (L) 01/15/2022   Lab Results  Component Value Date   CHOL 171 01/15/2022   TRIG 10 01/15/2022   HDL 36 (L) 01/15/2022   CHOLHDL 4.8 01/15/2022   VLDL 2 01/15/2022   LDLCALC 133 (H) 01/15/2022   LDLCALC 131 (H) 10/09/2021    See Psychiatric Specialty Exam and Suicide Risk Assessment completed by Attending Physician prior to discharge.  Discharge destination:  Home  Is patient on multiple antipsychotic therapies at  discharge:  No   Has Patient had three or more failed trials of antipsychotic monotherapy by history:  No  Recommended Plan for Multiple Antipsychotic Therapies: NA  Discharge Instructions     Diet - low sodium heart healthy   Complete by: As directed    Increase activity slowly   Complete by: As directed       Allergies as of 04/22/2022       Reactions   Latex Itching, Rash   Tramadol Itching, Other (See Comments)   Provider: Crist Fat CFM - Allergy Description: TraMADol HCl *ANALGESICS - OPIOID* CFM - Allergy Annotation: Pruritus.        Medication List     STOP taking these medications    benztropine 0.5 MG tablet Commonly known as: COGENTIN   FLUoxetine 10 MG capsule Commonly known as: PROZAC   haloperidol 10 MG tablet Commonly known as:  HALDOL   lamoTRIgine 25 MG tablet Commonly known as: LAMICTAL   liothyronine 25 MCG tablet Commonly known as: CYTOMEL   thiamine 100 MG tablet       TAKE these medications      Indication  nicotine polacrilex 2 MG gum Commonly known as: NICORETTE Take 1 each (2 mg total) by mouth as needed for smoking cessation.  Indication: Nicotine Addiction   OLANZapine 10 MG tablet Commonly known as: ZYPREXA Take 1 tablet (10 mg total) by mouth at bedtime.  Indication: Schizophrenia   propranolol 10 MG tablet Commonly known as: INDERAL Take 1 tablet (10 mg total) by mouth 3 (three) times daily.  Indication: Feeling Anxious   traZODone 50 MG tablet Commonly known as: DESYREL Take 1 tablet (50 mg total) by mouth at bedtime as needed for sleep.  Indication: Trouble Sleeping        Follow-up Information     [place] Follow up.   Specialty: Professional Counselor Why: You may go to this provider for therapy and medication management services during walk in hours for new patients:  Monday through Friday, from 9:00 am to 1:00 pm. Contact information: [address] [place] [address] [phone]         [facility] Call.   Why: A referral for services for their ACTT has been done on your behalf.  They currently have a 3 week wating period to get linked with services.  Please follow up with this provider to get connected. Contact information: [address] [place] [address] [phone]         [facility]. Go to.   Why: You can go here for affordable medications.  Please go to this location for medication refills. Contact information: [address] [place], [place] [address]                Follow-up recommendations:    Labs were reviewed with the patient, and abnormal results were discussed with the patient.   The patient is able to verbalize their individual safety  plan to this provider.   # It is recommended to the patient to continue psychiatric medications as prescribed, after discharge from the hospital.     # It is recommended to the patient to follow up with your outpatient psychiatric provider and PCP.   # It was discussed with the patient, the impact of alcohol, drugs, tobacco have been there overall psychiatric and medical wellbeing, and total abstinence from substance use was recommended the patient.ed.   # Prescriptions provided or  sent directly to preferred pharmacy at discharge. Patient agreeable to plan. Given opportunity to ask questions. Appears to feel comfortable with discharge.    # In the event of worsening symptoms, the patient is instructed to call the crisis hotline, 911 and or go to the nearest ED for appropriate evaluation and treatment of symptoms. To follow-up with primary care provider for other medical issues, concerns and or health care needs   # Patient was discharged home with a plan to follow up as noted above.   Patient agrees with D/C instructions and plan.   The patient received suicide prevention pamphlet:  Yes Belongings returned:  Valuables  Total Time Spent in Direct Patient Care:  I personally spent 45 minutes on the unit in direct patient care. The direct patient care time included face-to-face time with the patient, reviewing the patient's chart, communicating with other professionals, and coordinating care. Greater than 50% of this time was spent in counseling or coordinating care with the patient regarding goals of hospitalization, psycho-education, and discharge planning needs.    SignedSarita Bottom, MD 04/23/2022, 12:27 PM

## 2022-04-22 NOTE — BHH Suicide Risk Assessment (Signed)
[facility] Discharge Suicide Risk Assessment   Principal Problem: Schizophrenia [facility]) Discharge Diagnoses: Principal Problem:   Schizophrenia (HCC)   Total Time spent with patient: 45 minutes  Reason for admission: Barbara Gordon is a 31 y.o., female with a past psychiatric history significant for schizophrenia, substance use disorder and substance-induced psychosis who presents to the [facility] from [facility] emergency department for evaluation and management of worsening confusion and agitation.  According to outside records, the patient was admitted primarily on 6/2 for altered mental status to medical floor, psych consult on 6/5 indicates psychosis and paranoia, confusion after exposure to illicit substances and was recommended for admission after medically cleared.  PTA Medications:  Medications Prior to Admission  Medication Sig Dispense Refill Last Dose   benztropine (COGENTIN) 0.5 MG tablet Take 1 tablet (0.5 mg total) by mouth 2 (two) times daily as needed for up to 30 doses for tremors (EPS). (Patient not taking: Reported on 04/08/2022) 30 tablet 0     FLUoxetine (PROZAC) 10 MG capsule Take 1 capsule (10 mg total) by mouth daily. 30 capsule 0     haloperidol (HALDOL) 10 MG tablet Take 1 tablet (10 mg total) by mouth at bedtime. 30 tablet 0     lamoTRIgine (LAMICTAL) 25 MG tablet Take 1 tablet (25 mg total) by mouth daily for 4 days. (Patient not taking: Reported on 03/02/2022) 4 tablet 0     lamoTRIgine (LAMICTAL) 25 MG tablet Take 2 tablets (50 mg total) by mouth daily for 14 days. 28 tablet 0     liothyronine (CYTOMEL) 25 MCG tablet Take 1 tablet (25 mcg total) by mouth daily. 30 tablet 0     nicotine polacrilex (NICORETTE) 2 MG gum Take 1 each (2 mg total) by mouth as needed for smoking cessation. (Patient not taking: Reported on 04/08/2022) 50 tablet 0     thiamine 100 MG tablet Take 1 tablet (100 mg total) by mouth daily. (Patient not taking: Reported on 04/08/2022)            Hospital Course:   During the patient's hospitalization, patient had extensive initial psychiatric evaluation, and follow-up psychiatric evaluations every day.  Psychiatric diagnoses provided upon initial assessment: chronic schizophrenia, Marijuana use disorder  Patient's psychiatric medications were adjusted on admission: Start Zyprexa 2.5 mg in the morning and 5 mg at bedtime for mood and psychosis             Start trazodone at bedtime as needed for sleep             Start Vistaril as needed for anxiety  During the hospitalization, other adjustments were made to the patient's psychiatric medication regimen: zyprexa was titrated to 10 mg at bedtime, trazodone was used nightly and helped for sleep, vistaril was switched to propranolol for anxiety, was helpful and well tolerated.  Patient's care was discussed during the interdisciplinary team meeting every day during the hospitalization.  The patient denied having side effects to prescribed psychiatric medication.  Gradually, patient started adjusting to milieu. The patient was evaluated each day by a clinical provider to ascertain response to treatment. Improvement was noted by the patient's report of decreasing symptoms, improved sleep and appetite, affect, medication tolerance, behavior, and participation in unit programming.  Patient was asked each day to complete a self inventory noting mood, mental status, pain, new symptoms, anxiety and concerns.    Symptoms were reported as significantly decreased or resolved completely by discharge.   On day of discharge,  the patient reports that their mood is stable. The patient denied having suicidal thoughts for more than 48 hours prior to discharge.  Patient denies having homicidal thoughts.  Patient denies having auditory hallucinations.  Patient denies any visual hallucinations or other symptoms of psychosis. The patient was motivated to continue taking medication with a goal of continued  improvement in mental health.   The patient reports their target psychiatric symptoms of paranoia, psyhcosis and anxiety responded well to the psychiatric medications, and the patient reports overall benefit other psychiatric hospitalization. Supportive psychotherapy was provided to the patient. The patient also participated in regular group therapy while hospitalized. Coping skills, problem solving as well as relaxation therapies were also part of the unit programming.  Labs were reviewed with the patient, and abnormal results were discussed with the patient.  The patient is able to verbalize their individual safety plan to this provider.  # It is recommended to the patient to continue psychiatric medications as prescribed, after discharge from the hospital.    # It is recommended to the patient to follow up with your outpatient psychiatric provider and PCP.  # It was discussed with the patient, the impact of alcohol, drugs, tobacco have been there overall psychiatric and medical wellbeing, and total abstinence from substance use was recommended the patient.ed.  # Prescriptions provided or sent directly to preferred pharmacy at discharge. Patient agreeable to plan. Given opportunity to ask questions. Appears to feel comfortable with discharge.    # In the event of worsening symptoms, the patient is instructed to call the crisis hotline, 911 and or go to the nearest ED for appropriate evaluation and treatment of symptoms. To follow-up with primary care provider for other medical issues, concerns and or health care needs  # Patient was discharged home with friend with a plan to follow up as noted below.   Behavioral Events: none Restraints: none  Groups:attended and participated  Medications Changes: as above  On day of d/c patient presented pleasant and cooperative, calm, denied sihi or avh, motivated for dc, able to discuss triggers for increased psychosis and anxiety related top using  marijuana and non compliance with psych meds, agrees to abstain from marijuana after dc, also agrees to comply with FU appts and meds after dc.  D/C Medications: as above Patient was provided with 30 days script as well as 14 days free meds to improve compliance.  Musculoskeletal: Strength & Muscle Tone: within normal limits Gait & Station: normal Patient leans: N/A  Psychiatric Specialty Exam  General Appearance: appears at stated age, fairly dressed and groomed,   Behavior: pleasant and cooperative  Psychomotor Activity:No psychomotor agitation or retardation noted   Eye Contact: fair Speech: normal Speech Volume: normal   Mood: euthymic Affect: restricted  Thought Process: linear, goal directed  Descriptions of Associations: intact Thought Content: Hallucinations: denies AH, VH  Delusions: denies Paranoia  Suicidal Thoughts: denies SI, intention, plan  Homicidal Thoughts: denies HI, intention, plan   Alertness: alert Orientation: Fully oriented   Insight: improved, fair Judgment: improved, fair  Memory: intact  Executive Functions  Concentration: intact Attention Span: Fair Recall: intact Fund of Knowledge: fair   Assets  Assets:Desire for Improvement; Financial Resources/Insurance; Physical Health   Sleep  Sleep:No data recorded  Physical Exam: Physical Exam ROS Blood pressure 101/67, pulse 90, temperature 98.2 F (36.8 C), temperature source Oral, resp. rate 16, height 5\' 1"  (1.549 m), weight 53.1 kg, last menstrual period 04/17/2022, SpO2 97 %. Body mass index  is 22.11 kg/m.  Mental Status Per Nursing Assessment::   On Admission:  Suicidal ideation indicated by others, Self-harm behaviors  Demographic Factors:  NA  Loss Factors: NA  Historical Factors: NA  Risk Reduction Factors:   Living with another person, especially a relative  Continued Clinical Symptoms:  Depression:   Hopelessness Insomnia improved  Cognitive Features  That Contribute To Risk:  Closed-mindedness    Suicide Risk:  Minimal: No identifiable suicidal ideation.  Patients presenting with no risk factors but with morbid ruminations; may be classified as minimal risk based on the severity of the depressive symptoms   Follow-up Information     [facility] Follow up.   Specialty: Professional Counselor Why: You may go to this provider for therapy and medication management services during walk in hours for new patients:  Monday through Friday, from 9:00 am to 1:00 pm. Contact information: [address] [place] [address] [phone]         [facility] Call.   Why: A referral for services for their ACTT has been done on your behalf.  They currently have a 3 week wating period to get linked with services.  Please follow up with this provider to get connected. Contact information: [address] [place] [address] [phone]         [facility]. Go to.   Why: You can go here for affordable medications.  Please go to this location for medication refills. Contact information: [address] [place], [place] [address]                Plan Of Care/Follow-up recommendations:    Follow-up recommendations:    Patient was recommended to comply with Follow up appoitment and medications after discharge. D/w potential Se of medications. D/W patient safety plan, recommendations to call crisis line or 911 or return to ER if recurring SI/HI Patient agrees with D/C instructions and plan.  The patient received suicide prevention pamphlet:  yes Belongings returned:  Valuables  Total Time Spent in Direct Patient Care:  I personally spent 45 minutes on the unit in direct patient care. The direct patient care time included face-to-face time with the patient, reviewing the patient's chart, communicating with other professionals, and coordinating care.  Greater than 50% of this time was spent in counseling or coordinating care with the patient regarding goals of hospitalization, psycho-education, and discharge planning needs.   Barbara Gordon 04/22/2022, 10:08 AM   Barbara Stcharles Abbott Pao, MD 04/22/2022, 10:08 AM

## 2022-04-22 NOTE — Progress Notes (Signed)
Recreation Therapy Notes  INPATIENT RECREATION TR PLAN  Patient Details Name: Barbara Gordon MRN: [id number] DOB: 21-Oct-1991 Today's Date: 04/22/2022  Rec Therapy Plan Is patient appropriate for Therapeutic Recreation?: Yes Treatment times per week: about 3 days Estimated Length of Stay: 5-7 days TR Treatment/Interventions: Group participation (Comment)  Discharge Criteria Pt will be discharged from therapy if:: Discharged Treatment plan/goals/alternatives discussed and agreed upon by:: Patient/family  Discharge Summary Short term goals set: See patinet care plan Short term goals met: Not met Progress toward goals comments: Groups attended Which groups?: Wellness, Communication, Other (Comment) Producer, television/film/video) Reason goals not met: Pt didn't interact peers or staff during group sessions Therapeutic equipment acquired: N/A Reason patient discharged from therapy: Discharge from hospital Pt/family agrees with progress & goals achieved: Yes Date patient discharged from therapy: 04/22/22   Victorino Sparrow, Vickki Muff, [place] [facility] 04/22/2022, 1:22 PM

## 2022-04-23 NOTE — Progress Notes (Signed)
Chart was reviewed, problem list and dc summary were updated to reflect pt's dx chronic paranoid schizophrenia.

## 2022-04-24 ENCOUNTER — Emergency Department (HOSPITAL_COMMUNITY)
Admission: EM | Admit: 2022-04-24 | Discharge: 2022-04-25 | Disposition: A | Discharge status: Home / Self Care | Payer: Medicaid Other | Attending: Emergency Medicine | Admitting: Emergency Medicine

## 2022-04-24 ENCOUNTER — Encounter (HOSPITAL_COMMUNITY): Payer: Self-pay

## 2022-04-24 ENCOUNTER — Ambulatory Visit (HOSPITAL_COMMUNITY)
Admission: AD | Admit: 2022-04-24 | Discharge: 2022-04-24 | Disposition: A | Discharge status: Home / Self Care | Payer: No Payment, Other | Attending: Psychiatry | Admitting: Psychiatry

## 2022-04-24 ENCOUNTER — Other Ambulatory Visit: Payer: Self-pay

## 2022-04-24 ENCOUNTER — Emergency Department (HOSPITAL_COMMUNITY): Payer: Medicaid Other

## 2022-04-24 DIAGNOSIS — F1999 Other psychoactive substance use, unspecified with unspecified psychoactive substance-induced disorder: Secondary | ICD-10-CM | POA: Diagnosis present

## 2022-04-24 DIAGNOSIS — F1914 Other psychoactive substance abuse with psychoactive substance-induced mood disorder: Secondary | ICD-10-CM | POA: Insufficient documentation

## 2022-04-24 DIAGNOSIS — F1910 Other psychoactive substance abuse, uncomplicated: Secondary | ICD-10-CM | POA: Diagnosis present

## 2022-04-24 DIAGNOSIS — F1290 Cannabis use, unspecified, uncomplicated: Secondary | ICD-10-CM | POA: Insufficient documentation

## 2022-04-24 DIAGNOSIS — F12959 Cannabis use, unspecified with psychotic disorder, unspecified: Secondary | ICD-10-CM | POA: Diagnosis present

## 2022-04-24 DIAGNOSIS — F29 Unspecified psychosis not due to a substance or known physiological condition: Secondary | ICD-10-CM | POA: Insufficient documentation

## 2022-04-24 DIAGNOSIS — Z91040 Latex allergy status: Secondary | ICD-10-CM | POA: Insufficient documentation

## 2022-04-24 LAB — COMPREHENSIVE METABOLIC PANEL
ALT: 34 U/L (ref 0–44)
AST: 25 U/L (ref 15–41)
Albumin: 4.8 g/dL (ref 3.5–5.0)
Alkaline Phosphatase: 48 U/L (ref 38–126)
Anion gap: 10 (ref 5–15)
BUN: 10 mg/dL (ref 6–20)
CO2: 27 mmol/L (ref 22–32)
Calcium: 10.5 mg/dL — ABNORMAL HIGH (ref 8.9–10.3)
Chloride: 105 mmol/L (ref 98–111)
Creatinine, Ser: 0.74 mg/dL (ref 0.44–1.00)
GFR, Estimated: >60 mL/min (ref >60)
Glucose, Bld: 95 mg/dL (ref 70–99)
Potassium: 3.8 mmol/L (ref 3.5–5.1)
Sodium: 142 mmol/L (ref 135–145)
Total Bilirubin: 0.9 mg/dL (ref 0.3–1.2)
Total Protein: 8.2 g/dL — ABNORMAL HIGH (ref 6.5–8.1)

## 2022-04-24 LAB — CBC WITH DIFFERENTIAL/PLATELET
Abs Immature Granulocytes: 0.02 10*3/uL (ref 0.00–0.07)
Basophils Absolute: 0.1 10*3/uL (ref 0.0–0.1)
Basophils Relative: 1 %
Eosinophils Absolute: 0.4 10*3/uL (ref 0.0–0.5)
Eosinophils Relative: 4 %
HCT: 39.1 % (ref 36.0–46.0)
Hemoglobin: 14.4 g/dL (ref 12.0–15.0)
Immature Granulocytes: 0 %
Lymphocytes Relative: 43 %
Lymphs Abs: 4.2 10*3/uL — ABNORMAL HIGH (ref 0.7–4.0)
MCH: 32.5 pg (ref 26.0–34.0)
MCHC: 36.8 g/dL — ABNORMAL HIGH (ref 30.0–36.0)
MCV: 88.3 fL (ref 80.0–100.0)
Monocytes Absolute: 0.5 10*3/uL (ref 0.1–1.0)
Monocytes Relative: 5 %
Neutro Abs: 4.5 10*3/uL (ref 1.7–7.7)
Neutrophils Relative %: 47 %
Platelets: 329 10*3/uL (ref 150–400)
RBC: 4.43 MIL/uL (ref 3.87–5.11)
RDW: 11.2 % — ABNORMAL LOW (ref 11.5–15.5)
WBC: 9.6 10*3/uL (ref 4.0–10.5)
nRBC: 0 % (ref 0.0–0.2)

## 2022-04-24 LAB — ETHANOL: Alcohol, Ethyl (B): <10 mg/dL (ref <10)

## 2022-04-24 LAB — SALICYLATE LEVEL: Salicylate Lvl: <7 mg/dL — ABNORMAL LOW (ref 7.0–30.0)

## 2022-04-24 LAB — I-STAT BETA HCG BLOOD, ED (MC, WL, AP ONLY): I-stat hCG, quantitative: <5 m[IU]/mL (ref <5)

## 2022-04-24 LAB — RAPID URINE DRUG SCREEN, HOSP PERFORMED
Amphetamines: NOT DETECTED
Barbiturates: NOT DETECTED
Benzodiazepines: NOT DETECTED
Cocaine: NOT DETECTED
Opiates: NOT DETECTED
Tetrahydrocannabinol: POSITIVE — AB

## 2022-04-24 LAB — ACETAMINOPHEN LEVEL: Acetaminophen (Tylenol), Serum: <10 ug/mL — ABNORMAL LOW (ref 10–30)

## 2022-04-24 MED ORDER — OLANZAPINE 10 MG PO TABS
10.0000 mg | ORAL_TABLET | Freq: Every day | ORAL | Status: DC
  Administered 2022-04-24: 10 mg via ORAL
  Filled 2022-04-24: qty 1

## 2022-04-24 MED ORDER — LORAZEPAM 1 MG PO TABS
1.0000 mg | ORAL_TABLET | ORAL | Status: DC | PRN
  Administered 2022-04-25 (×2): 1 mg via ORAL
  Filled 2022-04-24 (×2): qty 1

## 2022-04-24 MED ORDER — SODIUM CHLORIDE 0.9 % IV BOLUS
1000.0000 mL | Freq: Once | INTRAVENOUS | Status: AC
  Administered 2022-04-24: 1000 mL via INTRAVENOUS

## 2022-04-24 MED ORDER — NICOTINE POLACRILEX 2 MG MT GUM: 2.0000 mg | CHEWING_GUM | OROMUCOSAL | Status: DC | PRN

## 2022-04-24 MED ORDER — PROPRANOLOL HCL 20 MG PO TABS
10.0000 mg | ORAL_TABLET | Freq: Three times a day (TID) | ORAL | Status: DC
  Administered 2022-04-24 – 2022-04-25 (×3): 10 mg via ORAL
  Filled 2022-04-24 (×3): qty 1

## 2022-04-24 MED ORDER — LORAZEPAM 2 MG/ML IJ SOLN
1.0000 mg | Freq: Once | INTRAMUSCULAR | Status: AC
  Administered 2022-04-24: 1 mg via INTRAVENOUS
  Filled 2022-04-24: qty 1

## 2022-04-24 MED ORDER — TRAZODONE HCL 50 MG PO TABS
50.0000 mg | ORAL_TABLET | Freq: Every evening | ORAL | Status: DC | PRN
  Administered 2022-04-24: 50 mg via ORAL
  Filled 2022-04-24: qty 1

## 2022-04-24 NOTE — ED Provider Notes (Signed)
[facility]-EMERGENCY DEPT Provider Note   CSN: [id number] Arrival date & time: 04/24/22  1815     History  Chief Complaint  Patient presents with   Psychiatric Evaluation    Barbara Gordon is a 31 y.o. female history of schizophrenia, polysubstance abuse here presenting with altered mental status, psychosis.  Patient was recently admitted to behavioral health for substance-induced psychosis and discharged from the hospital yesterday.  Patient returned back and was dropped off by " a friend" for altered mental status and psychosis.  Patient went to behavioral health initially.  She was thought to have recurrent substance-induced psychosis and sent here for medical clearance.  There was also concern for possible akathisia.  When patient got to the ER, she told the nurse " do they have a child molesters and here?"  She also was agitated but denies any drug use.  The history is provided by the patient.       Home Medications Prior to Admission medications   Medication Sig Start Date End Date Taking? Authorizing Provider  nicotine polacrilex (NICORETTE) 2 MG gum Take 1 each (2 mg total) by mouth as needed for smoking cessation. 04/22/22   Sarita Bottom, MD  OLANZapine (ZYPREXA) 10 MG tablet Take 1 tablet (10 mg total) by mouth at bedtime. 04/22/22   Sarita Bottom, MD  propranolol (INDERAL) 10 MG tablet Take 1 tablet (10 mg total) by mouth 3 (three) times daily. 04/22/22   Sarita Bottom, MD  traZODone (DESYREL) 50 MG tablet Take 1 tablet (50 mg total) by mouth at bedtime as needed for sleep. 04/22/22   Sarita Bottom, MD      Allergies    Latex and Tramadol    Review of Systems   Review of Systems  Psychiatric/Behavioral:  Positive for agitation. The patient is nervous/anxious.   All other systems reviewed and are negative.   Physical Exam Updated Vital Signs BP (!) 142/105 (BP Location: Right Arm)   Pulse 71   Temp 97.7 F (36.5 C) (Oral)   Resp 18   Ht 5'  1.5" (1.562 m)   Wt 52.2 kg   LMP 04/17/2022 (Exact Date)   SpO2 99%   BMI 21.38 kg/m  Physical Exam Vitals and nursing note reviewed.  Constitutional:      Comments: Altered, agitated   HENT:     Head: Normocephalic.     Nose: Nose normal.     Mouth/Throat:     Mouth: Mucous membranes are dry.  Eyes:     Extraocular Movements: Extraocular movements intact.     Pupils: Pupils are equal, round, and reactive to light.  Cardiovascular:     Rate and Rhythm: Normal rate and regular rhythm.     Pulses: Normal pulses.     Heart sounds: Normal heart sounds.  Pulmonary:     Effort: Pulmonary effort is normal.     Breath sounds: Normal breath sounds.  Abdominal:     General: Abdomen is flat.     Palpations: Abdomen is soft.  Musculoskeletal:        General: Normal range of motion.     Cervical back: Normal range of motion and neck supple.  Skin:    General: Skin is warm.     Capillary Refill: Capillary refill takes less than 2 seconds.  Neurological:     General: No focal deficit present.     Mental Status: She is oriented to person, place, and time.  Psychiatric:  Comments: Agitated, tremors     ED Results / Procedures / Treatments   Labs (all labs ordered are listed, but only abnormal results are displayed) Labs Reviewed  CBC WITH DIFFERENTIAL/PLATELET - Abnormal; Notable for the following components:      Result Value   MCHC 36.8 (*)    RDW 11.2 (*)    Lymphs Abs 4.2 (*)    All other components within normal limits  COMPREHENSIVE METABOLIC PANEL - Abnormal; Notable for the following components:   Calcium 10.5 (*)    Total Protein 8.2 (*)    All other components within normal limits  SALICYLATE LEVEL - Abnormal; Notable for the following components:   Salicylate Lvl <7.0 (*)    All other components within normal limits  ACETAMINOPHEN LEVEL - Abnormal; Notable for the following components:   Acetaminophen (Tylenol), Serum <10 (*)    All other components within  normal limits  RAPID URINE DRUG SCREEN, HOSP PERFORMED - Abnormal; Notable for the following components:   Tetrahydrocannabinol POSITIVE (*)    All other components within normal limits  ETHANOL  I-STAT BETA HCG BLOOD, ED ([facility], [facility], [facility] ONLY)    EKG None  Radiology CT HEAD WO CONTRAST ( )  Result Date: 04/24/2022 CLINICAL DATA:  Mental status change. EXAM: CT HEAD WITHOUT CONTRAST TECHNIQUE: Contiguous axial images were obtained from the base of the skull through the vertex without intravenous contrast. RADIATION DOSE REDUCTION: This exam was performed according to the departmental dose-optimization program which includes automated exposure control, adjustment of the mA and/or kV according to patient size and/or use of iterative reconstruction technique. COMPARISON:  None Available. FINDINGS: Brain: No acute infarct, hemorrhage, or mass lesion is present. No significant white matter lesions are present. The ventricles are of normal size. No significant extraaxial fluid collection is present. The brainstem and cerebellum are within normal limits. Vascular: No hyperdense vessel or unexpected calcification. Skull: Calvarium is intact. No focal lytic or blastic lesions are present. No significant extracranial soft tissue lesion is present. Sinuses/Orbits: The paranasal sinuses and mastoid air cells are clear. The globes and orbits are within normal limits. IMPRESSION: Negative CT of the head. Electronically Signed   By: Marin Roberts M.D.   On: 04/24/2022 19:43    Procedures Procedures    Medications Ordered in ED Medications  sodium chloride 0.9 % bolus 1,000 mL (1,000 mLs Intravenous New Bag/Given 04/24/22 1905)  LORazepam (ATIVAN) injection 1 mg (1 mg Intravenous Given 04/24/22 1906)    ED Course/ Medical Decision Making/ A&P                           Medical Decision Making Barbara Gordon is a 31 y.o. female here with psychosis.  Patient felt that there trial molesters here.   Patient appears to be responding to internal stimuli.  Per behavioral health note from earlier today, there is concern for possible akathisia.  However I think likely substance induced psychosis versus primary schizophrenia with hallucinations.  Plan to get CBC and CMP and tox and CT head.  Patient is agitated and attempting to leave so I IVC patient  8:02 PM I reviewed patient's labs and independently reviewed her CT scan.  Patient's serum tox is negative.  UDS is positive for marijuana which is a chronic finding.  Patient is more calm after Ativan.  Medically clear for psych eval   Problems Addressed: Psychosis, unspecified psychosis type (HCC): acute illness or injury  Amount  and/or Complexity of Data Reviewed Labs: ordered. Decision-making details documented in ED Course. Radiology: ordered and independent interpretation performed. Decision-making details documented in ED Course.  Risk Prescription drug management.    Final Clinical Impression(s) / ED Diagnoses Final diagnoses:  None    Rx / DC Orders ED Discharge Orders     None         Charlynne Pander, MD 04/24/22 2003

## 2022-04-24 NOTE — ED Triage Notes (Signed)
Pt presents to ED from [facility]. Per MD, pt was taken to be evaluated by MD d/t "feeling funny." Pt unable to express why she was brought in, just keeps repeating, "Do they let child molesters in here?" MD at bedside during triage.

## 2022-04-24 NOTE — BH Assessment (Signed)
Pt unable to participate in tele-assessment at this time due to limited rooms available.   Pamalee Leyden, [facility], [facility] Triage Specialist [phone]
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
## 2022-04-24 NOTE — ED Notes (Signed)
Patient provided with sandwich and coke per request

## 2022-04-25 NOTE — BH Assessment (Signed)
TTS clinician attempted assessment. Patient refused. Huntley Dec, RN notified.

## 2022-04-25 NOTE — ED Provider Notes (Signed)
Emergency Medicine Observation Re-evaluation Note  Barbara Gordon is a 31 y.o. female, seen on rounds today.  Pt initially presented to the ED for complaints of Psychiatric Evaluation Currently, the patient is resting comfortably.  Physical Exam  BP 123/79 (BP Location: Right Arm)   Pulse 81   Temp 97.8 F (36.6 C) (Oral)   Resp 18   Ht 5' 1.5" (1.562 m)   Wt 52.2 kg   LMP 04/17/2022 (Exact Date)   SpO2 97%   BMI 21.38 kg/m  Physical Exam General: Nontoxic appearance Cardiac: Normal heart rate Lungs: Normal respiratory Psych: No internal responsiveness  ED Course / MDM  EKG:   I have reviewed the labs performed to date as well as medications administered while in observation.  Recent changes in the last 24 hours include she has remained cooperative.  Cleared by psychiatry  Plan  Current plan is for outpatient psychiatric follow-up, resources given.  Barbara Gordon is not under involuntary commitment.     Mancel Bale, MD 04/25/22 [phone]

## 2022-04-25 NOTE — ED Notes (Signed)
Patient DC d off unit to home per provider. Patient alert, cooperative, calm, no s/s of distress. DC information and belongings given to patient. Patient ambulatory off unit, escorted by RN. Patient transported by friend.

## 2022-04-25 NOTE — Consult Note (Deleted)
[facility] Psych ED Discharge  04/25/2022 11:48 AM Barbara Gordon  MRN:  [id number]  Method of visit?: Face to Face   Principal Problem: <principal problem not specified> Discharge Diagnoses: Active Problems:   * No active hospital problems. *   Subjective: On assessment patient is alert and oriented to person, place, time and situation. She is calm and cooperative. Bright affect. Responds to questions appropriately. States she and her boyfriend had been smoking THC-A bought from local smoke store when she began to experience mental changes. Verbalizes insight that she after smoking the THC-A she became paranoid thinking someone was after her and her children to molest them. States she has a son and is from [facility], came to the area with friend "Thayer Ohm" who still lives here. Patient remained engaged throughout assessment, tracks during conversation with appropriate emotional affect. She reports chronic THC-A use "because it's the legal stuff". Provider spent extensive time providing education on substance use and side effects; pt verbalized an understanding and denies any plan to use again in the future. She denies any paranoia or delusional thoughts, suicidal or homicidal ideations, auditory or visual hallucinations. States she feels safe in her current environment, denies any abuse or safety concerns and would like to discharge. Provided verbal permission to speak with "Thayer Ohm" whom she lives with.   Collateral/Safety Planning: [name] [phone] [id number] States she learned last night patient indulged in [place]. States plan to follow up with APS tomorrow. Will be to pick up pt between 3:30-4:00.   Total Time spent with patient: 20 minutes  Past Psychiatric History: polysubstance use, substance induced disorder, schizophrenia  Past Medical History:  Past Medical History:  Diagnosis Date   Hypothyroidism    Polysubstance abuse (HCC)    Schizophrenia (HCC)    No past surgical history on  file. Family History: No family history on file. Family Psychiatric  History: not noted Social History:  Social History   Substance and Sexual Activity  Alcohol Use Not Currently     Social History   Substance and Sexual Activity  Drug Use Not Currently   Types: Cocaine, Amphetamines, Methamphetamines, Marijuana    Social History   Socioeconomic History   Marital status: Single    Spouse name: Not on file   Number of children: Not on file   Years of education: Not on file   Highest education level: Not on file  Occupational History   Not on file  Tobacco Use   Smoking status: Some Days    Types: Cigarettes   Smokeless tobacco: Never  Vaping Use   Vaping Use: Never used  Substance and Sexual Activity   Alcohol use: Not Currently   Drug use: Not Currently    Types: Cocaine, Amphetamines, Methamphetamines, Marijuana   Sexual activity: Never  Other Topics Concern   Not on file  Social History Narrative   Not on file   Social Determinants of Health   Financial Resource Strain: Not on file  Food Insecurity: Not on file  Transportation Needs: Not on file  Physical Activity: Not on file  Stress: Not on file  Social Connections: Not on file    Tobacco Cessation:  Prescription not provided because: pt declined  Current Medications: No current facility-administered medications for this encounter.   Current Outpatient Medications  Medication Sig Dispense Refill   nicotine polacrilex (NICORETTE) 2 MG gum Take 1 each (2 mg total) by mouth as needed for smoking cessation. 50 tablet 0   OLANZapine (ZYPREXA) 10 MG  tablet Take 1 tablet (10 mg total) by mouth at bedtime. 30 tablet 0   propranolol (INDERAL) 10 MG tablet Take 1 tablet (10 mg total) by mouth 3 (three) times daily. 90 tablet 0   traZODone (DESYREL) 50 MG tablet Take 1 tablet (50 mg total) by mouth at bedtime as needed for sleep. 30 tablet 0   Facility-Administered Medications Ordered in Other Encounters   Medication Dose Route Frequency Provider Last Rate Last Admin   LORazepam (ATIVAN) tablet 1 mg  1 mg Oral Q4H PRN Charlynne Pander, MD   1 mg at 04/25/22 0754   nicotine polacrilex (NICORETTE) gum 2 mg  2 mg Oral PRN Charlynne Pander, MD       OLANZapine [facility]) tablet 10 mg  10 mg Oral QHS Charlynne Pander, MD   10 mg at 04/24/22 2201   propranolol (INDERAL) tablet 10 mg  10 mg Oral TID Charlynne Pander, MD   10 mg at 04/25/22 0932   traZODone (DESYREL) tablet 50 mg  50 mg Oral QHS PRN Charlynne Pander, MD   50 mg at 04/24/22 2202   PTA Medications: (Not in a hospital admission)   Musculoskeletal: Strength & Muscle Tone: within normal limits Gait & Station: normal Patient leans: N/A  Psychiatric Specialty Exam:  Presentation  General Appearance: -- (laying in bed, tearful)  Eye Contact:Poor  Speech:Slow  Speech Volume:Decreased  Handedness:Right   Mood and Affect  Mood:Anxious; Dysphoric  Affect:Tearful; Constricted   Thought Process  Thought Processes:Goal Directed  Descriptions of Associations:Intact  Orientation:Full (Time, Place and Person)  Thought Content:Logical  History of Schizophrenia/Schizoaffective disorder:Yes  Duration of Psychotic Symptoms:Greater than six months  Hallucinations:No data recorded Ideas of Reference:-- (denies but vague about this)  Suicidal Thoughts:No data recorded Homicidal Thoughts:No data recorded  Sensorium  Memory:Immediate Poor; Recent Poor; Remote Poor  Judgment:Poor  Insight:Poor   Executive Functions  Concentration:Poor  Attention Span:Poor  Recall:Poor  Fund of Knowledge:Fair  Language:Fair   Psychomotor Activity  Psychomotor Activity:No data recorded  Assets  Assets:Desire for Improvement; Financial Resources/Insurance; Physical Health   Sleep  Sleep:No data recorded   Physical Exam: Physical Exam Vitals and nursing note reviewed.  Constitutional:      Appearance: She is  normal weight. She is not ill-appearing or toxic-appearing.  HENT:     Head: Normocephalic.     Nose: Nose normal.     Mouth/Throat:     Mouth: Mucous membranes are moist.     Pharynx: Oropharynx is clear.  Eyes:     Pupils: Pupils are equal, round, and reactive to light.  Cardiovascular:     Rate and Rhythm: Normal rate.     Pulses: Normal pulses.  Pulmonary:     Effort: Pulmonary effort is normal.  Abdominal:     General: Abdomen is flat.  Musculoskeletal:        General: Normal range of motion.     Cervical back: Normal range of motion.  Skin:    General: Skin is warm and dry.  Neurological:     Mental Status: She is alert and oriented to person, place, and time.  Psychiatric:        Attention and Perception: Attention and perception normal.        Mood and Affect: Mood normal.        Speech: Speech normal.        Behavior: Behavior normal. Behavior is cooperative.        Thought Content:  Thought content is not paranoid or delusional. Thought content does not include homicidal or suicidal ideation. Thought content does not include homicidal or suicidal plan.        Judgment: Judgment normal.    Review of Systems  Psychiatric/Behavioral:  Positive for substance abuse.   All other systems reviewed and are negative.  Blood pressure 135/87, pulse 87, temperature 98 F (36.7 C), resp. rate 16, last menstrual period 04/17/2022, SpO2 99 %. There is no height or weight on file to calculate BMI.   Demographic Factors:  Adolescent or young adult and Low socioeconomic status  Loss Factors: NA  Historical Factors: Family history of mental illness or substance abuse and history of substance abuse  Risk Reduction Factors:   Sense of responsibility to family and Living with another person, especially a relative  Continued Clinical Symptoms:  Alcohol/Substance Abuse/Dependencies  Cognitive Features That Contribute To Risk:  Closed-mindedness  ; history of TBI,  encephalopathy  Suicide Risk:  Mild:  Suicidal ideation of limited frequency, intensity, duration, and specificity.  There are no identifiable plans, no associated intent, mild dysphoria and related symptoms, good self-control (both objective and subjective assessment), few other risk factors, and identifiable protective factors, including available and accessible social support.   Plan Of Care/Follow-up recommendations:  Other:  Refrain from any substance use, Take medications as prescribed,  Keep follow up appointments.  [place] Follow up.   Specialty: Professional Counselor Why: You may go to this provider for therapy and medication management services during walk in hours for new patients:  Monday through Friday, from 9:00 am to 1:00 pm. Contact information: [address] [place] [address] [phone]  [facility] Call.   Why: A referral for services for their ACTT has been done on your behalf.  They currently have a 3 week wating period to get linked with services.  Please follow up with this provider to get connected. Contact information: [address] [place] [address] [phone]  [facility]. Go to.   Why: You can go here for affordable medications.  Please go to this location for medication refills. Contact information: [address] [place], [place] [address]    Disposition: Patient is being cleared for discharge. Plan discussed with both patient and responsible adult [name] who states plan to follow up with APS and outpatient services for continued care. States plan to pick up patient between 3:30-4:00 pm today.  Loletta Parish, NP 04/25/2022, 11:48 AM

## 2022-04-25 NOTE — Social Work (Signed)
CSW spoke with [name] via phone @ [phone] per consult from [facility] NP.  Per Ms. Magdich, her family brought Pt to live in their home after encountering Pt living on street while Pt was experiencing homelessness.  Pt has been staying with [name] for several months but the family is now concerned that they may not be able to provide the level of care that Pt needs.  Ms. Isidore Moos states that she has the number for [facility]PS and plans to contact them tomorrow.  CSW will add resources for [facility] to AVS and [name] will also contact [facility] tomorrow on Pt's behalf in an effort to connect Pt with further resources in an effort to support Pt's long-term needs.

## 2022-04-25 NOTE — Consult Note (Signed)
[facility] Psych ED Discharge  04/25/2022 12:32 PM Lalia Loudon  MRN:  [id number]  Method of visit?: Face to Face   Principal Problem: Substance-induced disorder [facility]) Discharge Diagnoses: Principal Problem:   Substance-induced disorder (HCC) Active Problems:   Polysubstance abuse (HCC)   Cannabis use with psychotic disorder (HCC)   Subjective: On assessment patient is alert and oriented to person, place, time and situation. She is calm and cooperative. Bright affect. Responds to questions appropriately. States she and her boyfriend had been smoking THC-A bought from local smoke store when she began to experience mental changes. Verbalizes insight that she after smoking the THC-A she became paranoid thinking someone was after her and her children to molest them. States she has a son and is from [facility] came to the area with friend "Thayer Ohm" who still lives here.  Patient remained engaged throughout assessment, tracks during conversation with appropriate emotional affect. She reports chronic THC-A use "because it's the legal stuff". Provider spent extensive time providing education on substance use and side effects; pt verbalized an understanding and denies any plan to use again in the future. She denies any paranoia or delusional thoughts, suicidal or homicidal ideations, auditory or visual hallucinations. States she feels safe in her current environment, denies any abuse or safety concerns and would like to discharge. Provided verbal permission to speak with "Thayer Ohm" whom she lives with.   Collateral/Safety Planning: [name] [phone] [id number] States patient has been living with her since December; learned last night patient indulged in [place]. Says she spoke with someone late last night, possible Child psychotherapist and requested assistance in finding more long-term solution for patient. States plan to follow up with APS tomorrow for more resources; this provider gave number on file. States plan  to pick up pt between 3:30-4:00. Provider spoke with Thayer Ohm at length who states plan to continue caring for patient until more permanent option is in place. She denies any safety concern with patient returning to her home at this time and states if anything changes she will bring her back to closest ED.   Total Time spent with patient: 20 minutes  Past Psychiatric History: polysubstance use, substance induced disorder, schizophrenia  Past Medical History:  Past Medical History:  Diagnosis Date   Hypothyroidism    Polysubstance abuse (HCC)    Schizophrenia (HCC)    History reviewed. No pertinent surgical history. Family History: No family history on file. Family Psychiatric  History: not noted Social History:  Social History   Substance and Sexual Activity  Alcohol Use Not Currently     Social History   Substance and Sexual Activity  Drug Use Not Currently   Types: Cocaine, Amphetamines, Methamphetamines, Marijuana    Social History   Socioeconomic History   Marital status: Single    Spouse name: Not on file   Number of children: Not on file   Years of education: Not on file   Highest education level: Not on file  Occupational History   Not on file  Tobacco Use   Smoking status: Some Days    Types: Cigarettes   Smokeless tobacco: Never  Vaping Use   Vaping Use: Never used  Substance and Sexual Activity   Alcohol use: Not Currently   Drug use: Not Currently    Types: Cocaine, Amphetamines, Methamphetamines, Marijuana   Sexual activity: Never  Other Topics Concern   Not on file  Social History Narrative   Not on file   Social Determinants of Health  Financial Resource Strain: Not on file  Food Insecurity: Not on file  Transportation Needs: Not on file  Physical Activity: Not on file  Stress: Not on file  Social Connections: Not on file    Tobacco Cessation:  Prescription not provided because: pt declined  Current Medications: Current  Facility-Administered Medications  Medication Dose Route Frequency Provider Last Rate Last Admin   LORazepam (ATIVAN) tablet 1 mg  1 mg Oral Q4H PRN Charlynne Pander, MD   1 mg at 04/25/22 0754   nicotine polacrilex (NICORETTE) gum 2 mg  2 mg Oral PRN Charlynne Pander, MD       OLANZapine [facility]) tablet 10 mg  10 mg Oral QHS Charlynne Pander, MD   10 mg at 04/24/22 2201   propranolol (INDERAL) tablet 10 mg  10 mg Oral TID Charlynne Pander, MD   10 mg at 04/25/22 0942   traZODone (DESYREL) tablet 50 mg  50 mg Oral QHS PRN Charlynne Pander, MD   50 mg at 04/24/22 2202   Current Outpatient Medications  Medication Sig Dispense Refill   nicotine polacrilex (NICORETTE) 2 MG gum Take 1 each (2 mg total) by mouth as needed for smoking cessation. 50 tablet 0   OLANZapine (ZYPREXA) 10 MG tablet Take 1 tablet (10 mg total) by mouth at bedtime. 30 tablet 0   propranolol (INDERAL) 10 MG tablet Take 1 tablet (10 mg total) by mouth 3 (three) times daily. 90 tablet 0   traZODone (DESYREL) 50 MG tablet Take 1 tablet (50 mg total) by mouth at bedtime as needed for sleep. 30 tablet 0   PTA Medications: (Not in a hospital admission)   Musculoskeletal: Strength & Muscle Tone: within normal limits Gait & Station: normal Patient leans: N/A  Psychiatric Specialty Exam:  Presentation  General Appearance: Casual  Eye Contact:Good  Speech:Clear and Coherent  Speech Volume:Normal  Handedness:Right   Mood and Affect  Mood:Euthymic  Affect:Appropriate   Thought Process  Thought Processes:Goal Directed  Descriptions of Associations:Intact  Orientation:Full (Time, Place and Person)  Thought Content:Logical  History of Schizophrenia/Schizoaffective disorder:Yes  Duration of Psychotic Symptoms:Greater than six months  Hallucinations:Hallucinations: None  Ideas of Reference:None  Suicidal Thoughts:Suicidal Thoughts: No  Homicidal Thoughts:Homicidal Thoughts: No   Sensorium   Memory:Immediate Good; Recent Fair; Remote Fair  Judgment:Fair  Insight:Present; Fair   Art therapist  Concentration:Fair  Attention Span:Fair  Recall:Fair  [employer] of Knowledge:Poor  Language:Fair   Psychomotor Activity  Psychomotor Activity:Psychomotor Activity: Normal   Assets  Assets:Desire for Improvement; Housing; Intimacy; Physical Health; Resilience; Social Support   Sleep  Sleep:Sleep: Good    Physical Exam: Physical Exam Vitals and nursing note reviewed.  Constitutional:      Appearance: She is normal weight. She is not ill-appearing or toxic-appearing.  HENT:     Head: Normocephalic.     Nose: Nose normal.     Mouth/Throat:     Mouth: Mucous membranes are moist.     Pharynx: Oropharynx is clear.  Eyes:     Pupils: Pupils are equal, round, and reactive to light.  Cardiovascular:     Rate and Rhythm: Normal rate.     Pulses: Normal pulses.  Pulmonary:     Effort: Pulmonary effort is normal.  Abdominal:     General: Abdomen is flat.  Musculoskeletal:        General: Normal range of motion.     Cervical back: Normal range of motion.  Skin:    General:  Skin is warm and dry.  Neurological:     Mental Status: She is alert and oriented to person, place, and time.  Psychiatric:        Attention and Perception: Attention and perception normal.        Mood and Affect: Mood normal.        Speech: Speech normal.        Behavior: Behavior normal. Behavior is cooperative.        Thought Content: Thought content is not paranoid or delusional. Thought content does not include homicidal or suicidal ideation. Thought content does not include homicidal or suicidal plan.        Judgment: Judgment normal.    Review of Systems  Psychiatric/Behavioral:  Positive for substance abuse.   All other systems reviewed and are negative.  Blood pressure 118/75, pulse 79, temperature 99 F (37.2 C), temperature source Oral, resp. rate 16, height 5' 1.5" (1.562  m), weight 52.2 kg, last menstrual period 04/17/2022, SpO2 100 %. Body mass index is 21.38 kg/m.   Demographic Factors:  Adolescent or young adult and Low socioeconomic status  Loss Factors: NA  Historical Factors: Family history of mental illness or substance abuse and history of substance abuse  Risk Reduction Factors:   Sense of responsibility to family and Living with another person, especially a relative  Continued Clinical Symptoms:  Alcohol/Substance Abuse/Dependencies  Cognitive Features That Contribute To Risk:  Closed-mindedness  ; history of TBI, encephalopathy  Suicide Risk:  Mild:  Suicidal ideation of limited frequency, intensity, duration, and specificity.  There are no identifiable plans, no associated intent, mild dysphoria and related symptoms, good self-control (both objective and subjective assessment), few other risk factors, and identifiable protective factors, including available and accessible social support.   Plan Of Care/Follow-up recommendations:  Other:  Refrain from any substance use, Take medications as prescribed,  Keep follow up appointments.  [place] Follow up.   Specialty: Professional Counselor Why: You may go to this provider for therapy and medication management services during walk in hours for new patients:  Monday through Friday, from 9:00 am to 1:00 pm. Contact information: [address] [place] [address] [phone]  [facility] Call.   Why: A referral for services for their ACTT has been done on your behalf.  They currently have a 3 week wating period to get linked with services.  Please follow up with this provider to get connected. Contact information: [address] [place] [address] [phone]  [facility]. Go to.   Why: You can go here for affordable medications.  Please go to this location for medication refills. Contact information: [address] [place], [place] [address]   Disposition: Patient is being cleared for discharge. Plan discussed with both patient and responsible adult [name] who states plan to follow up with APS and outpatient services for continued care. States plan to pick up patient between 3:30-4:00 pm today. TOC consult placed to assist with addressing psychosocial needs.  Loletta Parish, NP 04/25/2022, 12:32 PM

## 2022-04-25 NOTE — Discharge Instructions (Signed)
[facility] Mental Health Developmental Disabilities & Substance Abuse Service [address] [place], [place] [address]  [phone] [email or web address] 24-Hour Call Center: [phone] [facility] Crisis Line: [phone]

## 2022-04-27 ENCOUNTER — Emergency Department (HOSPITAL_COMMUNITY)
Admission: EM | Admit: 2022-04-27 | Discharge: 2022-04-27 | Disposition: A | Discharge status: Home / Self Care | Payer: Medicaid Other | Attending: Emergency Medicine | Admitting: Emergency Medicine

## 2022-04-27 ENCOUNTER — Encounter (HOSPITAL_COMMUNITY): Payer: Self-pay

## 2022-04-27 ENCOUNTER — Other Ambulatory Visit: Payer: Self-pay

## 2022-04-27 DIAGNOSIS — Z20822 Contact with and (suspected) exposure to covid-19: Secondary | ICD-10-CM | POA: Insufficient documentation

## 2022-04-27 DIAGNOSIS — F419 Anxiety disorder, unspecified: Secondary | ICD-10-CM | POA: Insufficient documentation

## 2022-04-27 DIAGNOSIS — F1290 Cannabis use, unspecified, uncomplicated: Secondary | ICD-10-CM | POA: Insufficient documentation

## 2022-04-27 DIAGNOSIS — F430 Acute stress reaction: Secondary | ICD-10-CM | POA: Insufficient documentation

## 2022-04-27 DIAGNOSIS — R456 Violent behavior: Secondary | ICD-10-CM | POA: Insufficient documentation

## 2022-04-27 DIAGNOSIS — Z91040 Latex allergy status: Secondary | ICD-10-CM | POA: Insufficient documentation

## 2022-04-27 DIAGNOSIS — Z79899 Other long term (current) drug therapy: Secondary | ICD-10-CM | POA: Insufficient documentation

## 2022-04-27 LAB — ETHANOL: Alcohol, Ethyl (B): <10 mg/dL (ref <10)

## 2022-04-27 LAB — RESP PANEL BY RT-PCR (FLU A&B, COVID) ARPGX2
Influenza A by PCR: NEGATIVE
Influenza B by PCR: NEGATIVE
SARS Coronavirus 2 by RT PCR: NEGATIVE

## 2022-04-27 LAB — CBC WITH DIFFERENTIAL/PLATELET
Abs Immature Granulocytes: 0.03 10*3/uL (ref 0.00–0.07)
Basophils Absolute: 0.1 10*3/uL (ref 0.0–0.1)
Basophils Relative: 1 %
Eosinophils Absolute: 0.3 10*3/uL (ref 0.0–0.5)
Eosinophils Relative: 3 %
HCT: 42.6 % (ref 36.0–46.0)
Hemoglobin: 15.6 g/dL — ABNORMAL HIGH (ref 12.0–15.0)
Immature Granulocytes: 0 %
Lymphocytes Relative: 26 %
Lymphs Abs: 2.8 10*3/uL (ref 0.7–4.0)
MCH: 32.6 pg (ref 26.0–34.0)
MCHC: 36.6 g/dL — ABNORMAL HIGH (ref 30.0–36.0)
MCV: 88.9 fL (ref 80.0–100.0)
Monocytes Absolute: 0.5 10*3/uL (ref 0.1–1.0)
Monocytes Relative: 5 %
Neutro Abs: 7.1 10*3/uL (ref 1.7–7.7)
Neutrophils Relative %: 65 %
Platelets: 382 10*3/uL (ref 150–400)
RBC: 4.79 MIL/uL (ref 3.87–5.11)
RDW: 11.6 % (ref 11.5–15.5)
WBC: 10.7 10*3/uL — ABNORMAL HIGH (ref 4.0–10.5)
nRBC: 0 % (ref 0.0–0.2)

## 2022-04-27 LAB — COMPREHENSIVE METABOLIC PANEL
ALT: 43 U/L (ref 0–44)
AST: 31 U/L (ref 15–41)
Albumin: 5 g/dL (ref 3.5–5.0)
Alkaline Phosphatase: 50 U/L (ref 38–126)
Anion gap: 10 (ref 5–15)
BUN: 13 mg/dL (ref 6–20)
CO2: 24 mmol/L (ref 22–32)
Calcium: 10.6 mg/dL — ABNORMAL HIGH (ref 8.9–10.3)
Chloride: 107 mmol/L (ref 98–111)
Creatinine, Ser: 0.62 mg/dL (ref 0.44–1.00)
GFR, Estimated: >60 mL/min (ref >60)
Glucose, Bld: 98 mg/dL (ref 70–99)
Potassium: 4.3 mmol/L (ref 3.5–5.1)
Sodium: 141 mmol/L (ref 135–145)
Total Bilirubin: 0.8 mg/dL (ref 0.3–1.2)
Total Protein: 8.7 g/dL — ABNORMAL HIGH (ref 6.5–8.1)

## 2022-04-27 LAB — RAPID URINE DRUG SCREEN, HOSP PERFORMED
Amphetamines: NOT DETECTED
Barbiturates: NOT DETECTED
Benzodiazepines: NOT DETECTED
Cocaine: NOT DETECTED
Opiates: NOT DETECTED
Tetrahydrocannabinol: POSITIVE — AB

## 2022-04-27 LAB — I-STAT BETA HCG BLOOD, ED (MC, WL, AP ONLY): I-stat hCG, quantitative: <5 m[IU]/mL (ref <5)

## 2022-04-27 NOTE — ED Notes (Signed)
Patient DC d off unit to home per provider. Patient alert, cooperative, no s/s of distress. DC information given to patient.  Belongings given to patient..  Patient ambulatory off unit, escorted by NT. Patient transported by friend.

## 2022-04-27 NOTE — ED Triage Notes (Signed)
Pt presents to ED and states that she is not feeling well. Pt denies pain and N/V. Pt reports having thoughts of hurting herself at this time. Pt also reports AH/VH. Pt reports taking her psychiatric medications as prescribed.

## 2022-04-27 NOTE — Discharge Instructions (Addendum)
It was our pleasure to provide your ER care today - we hope that you feel better.  Continue your medication as prescribed by your doctors.   Avoid any drug use.   For mental health issues and/or crisis, you may go directly to the [facility]al [facility] (see attached information) - it is open 24/7 and walk-ins are welcome.   Return to ER if worse, new symptoms, fevers, new/severe pain, trouble breathing, or other emergency concern.

## 2022-04-27 NOTE — ED Provider Triage Note (Signed)
Emergency Medicine Provider Triage Evaluation Note  Barbara Gordon , a 31 y.o. female  was evaluated in triage.  Pt complains of suicidal thoughts. Hx of same, recently seen here by psychiatry. Presents today stating she is not feeling well and having thoughts of harming herself, but reports no specific plan. Also reports auditory and visual hallucinations. Has been taking her meds as prescribed.   Review of Systems  Positive: SI, AVH Negative: HI  Physical Exam  BP 120/80 (BP Location: Left Arm)   Pulse 96   Temp 98.4 F (36.9 C) (Oral)   Resp 18   LMP 04/17/2022 (Exact Date)   SpO2 96%  Gen:   Awake, no distress   Resp:  Normal effort  MSK:   Moves extremities without difficulty  Other:    Medical Decision Making  Medically screening exam initiated at 1:07 PM.  Appropriate orders placed.  Barbara Gordon was informed that the remainder of the evaluation will be completed by another provider, this initial triage assessment does not replace that evaluation, and the importance of remaining in the ED until their evaluation is complete.     Barbara Gordon T, PA-C 04/27/22 1308

## 2022-04-27 NOTE — BH Assessment (Signed)
BHH Assessment Progress Note   At 12:10 this Clinical research associate received an unsolicited call from [employer] [phone]) in the community.  She reports that this pt is en route to the hospital with callers' son transporting her.  She reports that over the weekend, when pt was at the hospital, she received a call from [place] (possibly Maxie Barb, NP) informing her that pt was to be discharged.  Pt has reportedly been living with the caller since caller took her in from living on the street.  She has no relationship to the pt.  Caller offers minimal detail as to how the patient ended up living in her home.  Caller reports that pt did very well for about a day after returning to the household from the hospital, but then stopped responding to outside stimuli.  Caller reports that the son who was to transport pt is doing little better.  Caller then states that pt cannot return to her household.  Call was then concluded.  Doylene Canning, [place] Behavioral Health Coordinator [phone]

## 2022-04-27 NOTE — ED Provider Notes (Signed)
[facility]-EMERGENCY DEPT Provider Note   CSN: [id number] Arrival date & time: 04/27/22  1223     History  Chief Complaint  Patient presents with   behavioral symptoms    Barbara Gordon is a 31 y.o. female.  Patient presents indicates she felt poorly early today - pt poor/limited historian. When asked what she means by poorly, pt indicates feeling anxious/stressed. Pt unsure why she was feeling bad earlier - denies specific stressor or inciting event. Pt is noted to have recently behavioral health admission - indicates compliant w meds. Denies acute physical symptoms or pain. No headache. No chest pain or sob. No abd pain or nvd.  Normal appetite. Some trouble sleeping at night. Denies current substance use or alcohol use. No fever or chills.   The history is provided by the patient and medical records. The history is limited by the condition of the patient.       Home Medications Prior to Admission medications   Medication Sig Start Date End Date Taking? Authorizing Provider  nicotine polacrilex (NICORETTE) 2 MG gum Take 1 each (2 mg total) by mouth as needed for smoking cessation. 04/22/22   Sarita Bottom, MD  OLANZapine (ZYPREXA) 10 MG tablet Take 1 tablet (10 mg total) by mouth at bedtime. 04/22/22   Sarita Bottom, MD  propranolol (INDERAL) 10 MG tablet Take 1 tablet (10 mg total) by mouth 3 (three) times daily. 04/22/22   Sarita Bottom, MD  traZODone (DESYREL) 50 MG tablet Take 1 tablet (50 mg total) by mouth at bedtime as needed for sleep. 04/22/22   Sarita Bottom, MD      Allergies    Latex and Tramadol    Review of Systems   Review of Systems  Constitutional:  Negative for fever.  HENT:  Negative for sore throat.   Eyes:  Negative for visual disturbance.  Respiratory:  Negative for cough and shortness of breath.   Cardiovascular:  Negative for chest pain.  Gastrointestinal:  Negative for abdominal pain, diarrhea and vomiting.  Genitourinary:   Negative for dysuria and flank pain.  Musculoskeletal:  Negative for back pain and neck pain.  Skin:  Negative for rash.  Neurological:  Negative for headaches.  Hematological:  Does not bruise/bleed easily.  Psychiatric/Behavioral:  Negative for suicidal ideas. The patient is nervous/anxious.     Physical Exam Updated Vital Signs BP 120/80 (BP Location: Left Arm)   Pulse 96   Temp 98.4 F (36.9 C) (Oral)   Resp 18   LMP 04/17/2022 (Exact Date)   SpO2 96%  Physical Exam Vitals and nursing note reviewed.  Constitutional:      Appearance: Normal appearance. She is well-developed.  HENT:     Head: Atraumatic.     Nose: Nose normal.     Mouth/Throat:     Mouth: Mucous membranes are moist.  Eyes:     General: No scleral icterus.    Conjunctiva/sclera: Conjunctivae normal.     Pupils: Pupils are equal, round, and reactive to light.  Neck:     Trachea: No tracheal deviation.     Comments: No stiffness or rigidity.  Cardiovascular:     Rate and Rhythm: Normal rate and regular rhythm.     Pulses: Normal pulses.     Heart sounds: Normal heart sounds. No murmur heard.    No friction rub. No gallop.  Pulmonary:     Effort: Pulmonary effort is normal. No respiratory distress.     Breath sounds: Normal  breath sounds.  Abdominal:     General: Bowel sounds are normal. There is no distension.     Palpations: Abdomen is soft.     Tenderness: There is no abdominal tenderness. There is no guarding.  Genitourinary:    Comments: No cva tenderness.  Musculoskeletal:        General: No swelling or tenderness.     Cervical back: Normal range of motion and neck supple. No rigidity. No muscular tenderness.  Skin:    General: Skin is warm and dry.     Findings: No rash.  Neurological:     Mental Status: She is alert.     Comments: Alert, speech normal. Motor/sens grossly intact bil. Ambulates w steady gait.   Psychiatric:     Comments: Mildly anxious appearing. Does not appear acutely  depressed or despondent. Is conversant although generally poor historian with limited insight. Acknowledges intermittently feeling anxious and stressed. Denies thoughts of harm to self or others. Pt does not currently appear to be responding to internal stimuli.      ED Results / Procedures / Treatments   Labs (all labs ordered are listed, but only abnormal results are displayed) Results for orders placed or performed during the hospital encounter of 04/27/22  Comprehensive metabolic panel  Result Value Ref Range   Sodium 141 135 - 145 mmol/L   Potassium 4.3 3.5 - 5.1 mmol/L   Chloride 107 98 - 111 mmol/L   CO2 24 22 - 32 mmol/L   Glucose, Bld 98 70 - 99 mg/dL   BUN 13 6 - 20 mg/dL   Creatinine, Ser 7.41 0.44 - 1.00 mg/dL   Calcium 28.7 (H) 8.9 - 10.3 mg/dL   Total Protein 8.7 (H) 6.5 - 8.1 g/dL   Albumin 5.0 3.5 - 5.0 g/dL   AST 31 15 - 41 U/L   ALT 43 0 - 44 U/L   Alkaline Phosphatase 50 38 - 126 U/L   Total Bilirubin 0.8 0.3 - 1.2 mg/dL   GFR, Estimated >86 >76 mL/min   Anion gap 10 5 - 15  Ethanol  Result Value Ref Range   Alcohol, Ethyl (B) <10 <10 mg/dL  Urine rapid drug screen (hosp performed)  Result Value Ref Range   Opiates NONE DETECTED NONE DETECTED   Cocaine NONE DETECTED NONE DETECTED   Benzodiazepines NONE DETECTED NONE DETECTED   Amphetamines NONE DETECTED NONE DETECTED   Tetrahydrocannabinol POSITIVE (A) NONE DETECTED   Barbiturates NONE DETECTED NONE DETECTED  CBC with Diff  Result Value Ref Range   WBC 10.7 (H) 4.0 - 10.5 K/uL   RBC 4.79 3.87 - 5.11 MIL/uL   Hemoglobin 15.6 (H) 12.0 - 15.0 g/dL   HCT 72.0 94.7 - 09.6 %   MCV 88.9 80.0 - 100.0 fL   MCH 32.6 26.0 - 34.0 pg   MCHC 36.6 (H) 30.0 - 36.0 g/dL   RDW 28.3 66.2 - 94.7 %   Platelets 382 150 - 400 K/uL   nRBC 0.0 0.0 - 0.2 %   Neutrophils Relative % 65 %   Neutro Abs 7.1 1.7 - 7.7 K/uL   Lymphocytes Relative 26 %   Lymphs Abs 2.8 0.7 - 4.0 K/uL   Monocytes Relative 5 %   Monocytes  Absolute 0.5 0.1 - 1.0 K/uL   Eosinophils Relative 3 %   Eosinophils Absolute 0.3 0.0 - 0.5 K/uL   Basophils Relative 1 %   Basophils Absolute 0.1 0.0 - 0.1 K/uL   Immature Granulocytes  0 %   Abs Immature Granulocytes 0.03 0.00 - 0.07 K/uL  I-Stat beta hCG blood, ED  Result Value Ref Range   I-stat hCG, quantitative <5.0 <5 mIU/mL   Comment 3           CT HEAD WO CONTRAST ( )  Result Date: 04/24/2022 CLINICAL DATA:  Mental status change. EXAM: CT HEAD WITHOUT CONTRAST TECHNIQUE: Contiguous axial images were obtained from the base of the skull through the vertex without intravenous contrast. RADIATION DOSE REDUCTION: This exam was performed according to the departmental dose-optimization program which includes automated exposure control, adjustment of the mA and/or kV according to patient size and/or use of iterative reconstruction technique. COMPARISON:  None Available. FINDINGS: Brain: No acute infarct, hemorrhage, or mass lesion is present. No significant white matter lesions are present. The ventricles are of normal size. No significant extraaxial fluid collection is present. The brainstem and cerebellum are within normal limits. Vascular: No hyperdense vessel or unexpected calcification. Skull: Calvarium is intact. No focal lytic or blastic lesions are present. No significant extracranial soft tissue lesion is present. Sinuses/Orbits: The paranasal sinuses and mastoid air cells are clear. The globes and orbits are within normal limits. IMPRESSION: Negative CT of the head. Electronically Signed   By: Marin Roberts M.D.   On: 04/24/2022 19:43   DG Chest Port 1 View  Result Date: 04/10/2022 CLINICAL DATA:  Acute encephalopathy. EXAM: PORTABLE CHEST 1 VIEW COMPARISON:  None Available. FINDINGS: The heart size and mediastinal contours are within normal limits. Both lungs are clear. The visualized skeletal structures are unremarkable. IMPRESSION: No active disease. Electronically Signed   By:  Danae Orleans M.D.   On: 04/10/2022 08:39     EKG None  Radiology No results found.  Procedures Procedures    Medications Ordered in ED Medications - No data to display  ED Course/ Medical Decision Making/ A&P                           Medical Decision Making Problems Addressed: Anxiety: acute illness or injury with systemic symptoms    Details: acute/chronic, rec bh f/u Marijuana use: chronic illness or injury with exacerbation, progression, or side effects of treatment Stress reaction, acute, predominant emotional: acute illness or injury with systemic symptoms  Amount and/or Complexity of Data Reviewed External Data Reviewed: notes. Labs: ordered. Decision-making details documented in ED Course.   Iv ns. Continuous pulse ox and cardiac monitoring. Labs ordered/sent.   Reviewed nursing notes and prior charts for additional history. External reports reviewed.   Cardiac monitor: sinus rhythm, rate 90.  Labs reviewed/interpreted by me - chem normal.   Recent CT reviewed/interpreted by me - no hem.   Recheck pt, content, no acute distress. Vitals normal. Pt appears stable for d/c.   Rec close pcp and behavioral health follow up.          Final Clinical Impression(s) / ED Diagnoses Final diagnoses:  None    Rx / DC Orders ED Discharge Orders     None         [name], MD 04/27/22 [phone]

## 2022-04-28 ENCOUNTER — Ambulatory Visit (HOSPITAL_COMMUNITY)
Admission: AD | Admit: 2022-04-28 | Discharge: 2022-04-28 | Disposition: A | Discharge status: Home / Self Care | Payer: No Payment, Other | Attending: Psychiatry | Admitting: Psychiatry

## 2022-04-28 ENCOUNTER — Ambulatory Visit (HOSPITAL_COMMUNITY)
Admission: EM | Admit: 2022-04-28 | Discharge: 2022-04-28 | Disposition: A | Discharge status: Home / Self Care | Payer: No Payment, Other | Attending: Registered Nurse | Admitting: Registered Nurse

## 2022-04-28 ENCOUNTER — Encounter (HOSPITAL_COMMUNITY): Payer: Self-pay | Admitting: Registered Nurse

## 2022-04-28 NOTE — Discharge Instructions (Addendum)
Substance Abuse Treatment Programs ° °Intensive Outpatient Programs °[facility]     °[address]      °[place], [place]                   °[phone]      ° °[facility] °[address] °[place], [place] °[phone] ° °[facility]     °(Inpatient and outpatient)     °[address]           °[phone]   ° °[facility] °[phone] (Suboxone and Methadone) ° °[address]      °[place], [place] [address]      °[phone]      ° °[address] °[place], [place] °852-3033 ° °[facility] (Outpatient/Inpatient, [facility])    °(insurance only) [phone]      °       °[facility] (Groups & Residential) °[place], [place] °[phone] ° °   °[facility]     °[address]     °[place], [place]      °[phone]      ° °[facility] (for caregivers and family) °[address] °[place], [place] °336-299-4655 ° ° ° ° ° °Residential Treatment Programs °[facility]      °[address], [place], [place] [address]  °[phone]      ° °[facility] °[address] [place], [place] [address] °[phone] ° °[facility]        °[phone]      ° °[facility] °[phone] ° °[facility]             °[address]                                         °[place], [place]                                                °[phone] or [phone]                              ° °[facility] [place] °[address] °[place], [address] °[phone] ° °[facility]    °[address]      °[place], [place]     °[phone]      ° °[facility] °[address] °[place], [place] °336-285-9073 ° °[facility]   °[address]     °[place], [place] [address]     °[phone]      °Admissions: 8am-3pm M-F ° °[facility] °[address] °[place],  [place] °336-227-7417 ° °[facility]: Residential Program (90 Days)   °[place], [place]      °336-725-8389 or [phone]    ° °[facility] °[place], [place] °(Walk in Hours over the weekend or by referral) ° °[facility] °[address], [place],  [address] °([phone] ° °Crisis Mobile: [facility]:  1-877-626-1772 (for crisis response 24 hours a day) °[facility] Hotline:      1-800-256-2452 °Outpatient Psychiatry and Counseling ° °[facility]: Mobile Crisis   Management 24 hours:  1-877-626-1772 ° °[facility] sliding scale fee and walk in schedule: M-F 8am-12pm/1pm-3pm °[address]  °[place], [place] [address] °[phone] ° °[facility] °[address] °[place], [place] [address] °336-703-9650 ° °[facility] ([facility] known [facility])- new patient walk-in appointments available Monday - Friday 8am -3pm.          °[address] °[place], [place] [address] °336-676-6840 or crisis line- 336-676-6905 ° °[facility]/ Intensive Outpatient Therapy Program °[address] °[place], [place] [address] °336-832-9804 ° °[facility]                  °Crisis Services      °[phone]      °[address]     °[place], [place] [address]                ° °[facility]   °[facility] °[phone] °[address] °[place], [place] [address] ° ° °[facility]          °[address]  °[place], [place] [address]       °[phone] ° °[facility] °[address] °[place], [place] [address] °336-292-1510 ° °[facility]    °[address]    °[place], [place] [address]     °[phone]      ° °Parish McKinney, MD     °[address]     °[place] [address]     °336-282-1251     °  °[facility] °[address] °[place] [address] ° °[facility]     °[address]     °[place], [place]      °336-542-2076      ° °[facility] °Shamsher Ahluwalia, MD °[address] °[place], [place] [address] °336-420-9558 ° °[facility]     °[address]     °[place], [place] [address]     °336-274-1237      ° °[facility] °[address] °[place], [place] [address] °336-854-4450 °Resources for Temporary Residential Assistance/Crisis Centers ° °DAY CENTERS °[facility] °M-F 8am-3pm   °[address] [place], [place] [address]   336-332-0824 °Services include: laundry, barbering, support groups, case management, phone  & computer access, showers, AA/NA mtgs, mental health/substance abuse nurse, job skills class, disability information, VA assistance, spiritual classes, etc.  ° °HOMELESS SHELTERS ° °[facility]     °[facility]   °[address], [place]     °[phone]       °       °[facility] (women and children)       °[address] °[place], [place] [address] °336-275-0820 °[email or web address] for application and process °Application Required ° °[facility]   °[address]    °[place] [address]     °[phone]       °             °[facility] °[address] °,  [address] °336.273.5572 °336-235-0363(schedule application appt.) °Application Required ° °[facility] (women only)    °[address]     °[place],  [address]     °336-884-1039      °  Intake starts 6pm daily Need valid ID, SSC, & Police report [employer] [address] [place], [place] [phone] Application Required  [facility] (men only)     [address]      [place], [place]     [phone]       [facility] (Pregnant women only) [address] [place], [place] [phone]  [facility]      [address]      [place], [place] [address]     [phone]             [facility] [address] [place], [place] [phone] 90 day commitment/SA/Application process  [facility](men only)     [address]     [place], [place]     [phone]       Check-in at [facility] [address] [place], [place] [address] [phone] Men/Women/Women and Children must be there by 7 pm  [facility] [place], [place] [phone]                    Outpatient psychiatric Services  Walk in hours for medication management Monday, Wednesday, Thursday, and Friday from 8:00 AM to 11:00 AM Recommend arriving by by 7:30 AM.  It is first come first serve.    Walk in hours for therapy intake Monday and Wednesday only 8:00 AM to 11:00 AM Encouraged to arrive by 7:30 AM.  It is first come first serve   Inpatient patient psychiatric services [facility] offers comprehensive behavioral heath care services for mental health and substance abuse treatment.  Social work can also assist with referral to or getting you into a rehabilitation program short or long term

## 2022-04-28 NOTE — ED Provider Notes (Signed)
  Patient initially presented to [facility] as a walk in and was seen by Alan Mulder, NP.  Patient was psychiatrically cleared and told to follow up with resources that was given to her at discharge on 04/25/2022.  Patient was discharged for [facility] 04/22/2022 and was seen in ED 04/24/2022 and discharged on 04/25/2022.  Patient seen in ED 04/27/2022 and discharged.    Patient presentation similar with complaints of she doesn't know what is going on.  States "I just need a place to stay.  Patient informed that she was just seen and psychiatrically cleared and was informed to follow up with resources for outpatient psychiatric services.  Will print a copy of AVS with discharge instructions for patient.      Spoke to Alan Mulder, NP and reports patient was psychiatrically cleared to follow up with resources given.  States that she did not send patient to [facility].    Tinea Nobile B. Joshlynn Alfonzo, NP

## 2022-04-28 NOTE — H&P (Signed)
BH Observation Unit Provider Admission PAA/H&P  Patient Identification: Barbara Gordon MRN:  [id number] Date of Evaluation:  04/28/2022 Chief Complaint:  Urgent Emergent Eval Principal Diagnosis: <principal problem not specified> Diagnosis:  Active Problems:   * No active hospital problems. *  History of Present Illness: Barbara Gordon is a 31 y.o., female with a past psychiatric history significant for schizophrenia, substance use disorder and substance-induced psychosis who presents to the [facility] as a walk-in accompanied by a female friend for a complaint of "wanting to get out of psychosis."  Patient was recently admitted at [facility] treated and discharged from from June 10 through April 22, 2022.  On April 24, 2022, patient presented to [facility] long ED for psychosis.  And on April 28, 2022 patient presented to [facility] long ED for anxiety. Chart review indicates patient also admitted primarily on April 09, 2022 for altered mental status to medical floor at [facility] long, psych consult on June 2023 indicates psychosis and paranoia, confusion after exposure to illicit substances and was recommended for admission after medically cleared.  On assessment today patient was examined sitting in the screen room and appeared agitated.  Chart was reviewed and findings shared with the treatment team and discussed with Dr. Lucianne Muss.  Alert and oriented x3.  Speech clear and answers provided to questions were mainly "I do not know and I am not sure."  However patient endorsed suicidal ideation, denies HI and AVH, paranoia or delusions. Denies self-injurious behavior, rated anxiety as 10/10 on a scale of 0-10 and 10 being the highest. When asked about how many hours of sleep per night reported "not sure." Denied being followed by a therapist or a psychiatrist Endorse family history of mental illness.  Mom diagnosed with schizophrenia Denied taking medications stated "not sure", denies alcohol use or drug use.    Endorsed smoking 1 pack of cigarette daily. When asked about history of trauma or abuse stated "a little bit." Denies access to firearms and when asked about symptoms of depression stated "not really."  Disposition: As per my evaluation, patient was recently discharged with instructions noted below.  She was asked to return home and followed the plan of care encouraged by the psychiatrist in AVS.  Medication List       STOP taking these medications     benztropine 0.5 MG tablet Commonly known as: COGENTIN    FLUoxetine 10 MG capsule Commonly known as: PROZAC    haloperidol 10 MG tablet Commonly known as: HALDOL    lamoTRIgine 25 MG tablet Commonly known as: LAMICTAL    liothyronine 25 MCG tablet Commonly known as: CYTOMEL    thiamine 100 MG tablet           TAKE these medications       Indication  nicotine polacrilex 2 MG gum Commonly known as: NICORETTE Take 1 each (2 mg total) by mouth as needed for smoking cessation.   Indication: Nicotine Addiction    OLANZapine 10 MG tablet Commonly known as: ZYPREXA Take 1 tablet (10 mg total) by mouth at bedtime.   Indication: Schizophrenia    propranolol 10 MG tablet Commonly known as: INDERAL Take 1 tablet (10 mg total) by mouth 3 (three) times daily.   Indication: Feeling Anxious    traZODone 50 MG tablet Commonly known as: DESYREL Take 1 tablet (50 mg total) by mouth at bedtime as needed for sleep.   Indication: Trouble Sleeping  Follow-up Information       [facility] Follow up.   Specialty: Professional Counselor Why: You may go to this provider for therapy and medication management services during walk in hours for new patients:  Monday through Friday, from 9:00 am to 1:00 pm. Contact information: [address] [place] [address] [phone]              [facility] Call.   Why: A referral for services for their ACTT has been  done on your behalf.  They currently have a 3 week wating period to get linked with services.  Please follow up with this provider to get connected. Contact information: [address] [place] [address] [phone]              [facility]. Go to.   Why: You can go here for affordable medications.  Please go to this location for medication refills. Contact information: [address] [place], [place] [address]                        Follow-up recommendations:     Labs were reviewed with the patient, and abnormal results were discussed with the patient. The patient is able to verbalize their individual safety plan to this provider.  # It is recommended to the patient to continue psychiatric medications as prescribed, after discharge from the hospital.   # It is recommended to the patient to follow up with your outpatient psychiatric provider and PCP. # It was discussed with the patient, the impact of alcohol, drugs, tobacco have been there overall psychiatric and medical wellbeing, and total abstinence from substance use was recommended the patient.ed. # Prescriptions provided or sent directly to preferred pharmacy at discharge. Patient agreeable to plan. Given opportunity to ask questions. Appears to feel comfortable with discharge.   # In the event of worsening symptoms, the patient is instructed to call the crisis hotline, 911 and or go to the nearest ED for appropriate evaluation and treatment of symptoms. To follow-up with primary care provider for other medical issues, concerns and or health care needs # Patient was discharged home with a plan to follow up as noted above. Patient agrees with D/C instructions and plan. The patient received suicide prevention pamphlet:  Yes   Associated Signs/Symptoms:  Depression Symptoms:  fatigue, feelings of worthlessness/guilt, difficulty concentrating, hopelessness, anxiety,  (Hypo) Manic Symptoms:   Impulsivity,  Anxiety Symptoms:  Excessive Worry,  Psychotic Symptoms:   n/a  PTSD Symptoms: NA Total Time spent with patient: 1 hour  Past Psychiatric History: Prior Psychiatric diagnoses: Schizophrenia, substance use disorder and substance-induced psychosis Past Psychiatric Hospitalizations: Multiple ED visits for altered mental status and psychosis  Is the patient at risk to self? No.  Has the patient been a risk to self in the past 6 months? Yes.    Has the patient been a risk to self within the distant past? Yes.    Is the patient a risk to others? No.  Has the patient been a risk to others in the past 6 months? No.  Has the patient been a risk to others within the distant past? No.   Prior Inpatient Therapy:   Prior Outpatient Therapy:    Alcohol Screening:   Substance Abuse History in the last 12 months:  Yes.   Consequences of Substance Abuse: Health Hx Previous Psychotropic Medications: Yes  Psychological Evaluations: Yes  Past Medical History:  Past Medical History:  Diagnosis Date   Hypothyroidism    Polysubstance abuse (HCC)    Schizophrenia (HCC)    History reviewed. No pertinent surgical history. Family History: History reviewed. No pertinent family history. Family Psychiatric History: Psychiatric illness: Denies Suicide: Denies Substance Abuse: Denies   Tobacco Screening:   Social History:  Social History   Substance and Sexual Activity  Alcohol Use Not Currently     Social History   Substance and Sexual Activity  Drug Use Not Currently   Types: Cocaine, Amphetamines, Methamphetamines, Marijuana    Additional Social History:   Allergies:   Allergies  Allergen Reactions   Latex Itching and Rash   Tramadol Itching and Other (See Comments)    Provider: Crist Fat CFM - Allergy Description: TraMADol HCl *ANALGESICS - OPIOID* CFM - Allergy Annotation: Pruritus.    Lab Results:  Results for orders placed or performed during the hospital  encounter of 04/27/22 (from the past 48 hour(s))  Comprehensive metabolic panel     Status: Abnormal   Collection Time: 04/27/22  1:07 PM  Result Value Ref Range   Sodium 141 135 - 145 mmol/L   Potassium 4.3 3.5 - 5.1 mmol/L   Chloride 107 98 - 111 mmol/L   CO2 24 22 - 32 mmol/L   Glucose, Bld 98 70 - 99 mg/dL    Comment: Glucose reference range applies only to samples taken after fasting for at least 8 hours.   BUN 13 6 - 20 mg/dL   Creatinine, Ser 1.61 0.44 - 1.00 mg/dL   Calcium 09.6 (H) 8.9 - 10.3 mg/dL   Total Protein 8.7 (H) 6.5 - 8.1 g/dL   Albumin 5.0 3.5 - 5.0 g/dL   AST 31 15 - 41 U/L   ALT 43 0 - 44 U/L   Alkaline Phosphatase 50 38 - 126 U/L   Total Bilirubin 0.8 0.3 - 1.2 mg/dL   GFR, Estimated >04 >54 mL/min    Comment: (NOTE) Calculated using the CKD-EPI Creatinine Equation (2021)    Anion gap 10 5 - 15    Comment: Performed at [facility], [address], [place], [place] [address]  CBC with Diff     Status: Abnormal   Collection Time: 04/27/22  1:07 PM  Result Value Ref Range   WBC 10.7 (H) 4.0 - 10.5 K/uL   RBC 4.79 3.87 - 5.11 MIL/uL   Hemoglobin 15.6 (H) 12.0 - 15.0 g/dL   HCT 91.4 78.2 - 95.6 %   MCV 88.9 80.0 - 100.0 fL   MCH 32.6 26.0 - 34.0 pg   MCHC 36.6 (H) 30.0 - 36.0 g/dL   RDW 21.3 08.6 - 57.8 %   Platelets 382 150 - 400 K/uL   nRBC 0.0 0.0 - 0.2 %   Neutrophils Relative % 65 %   Neutro Abs 7.1 1.7 - 7.7 K/uL   Lymphocytes Relative 26 %   Lymphs Abs 2.8 0.7 - 4.0 K/uL   Monocytes Relative 5 %   Monocytes Absolute 0.5 0.1 - 1.0 K/uL   Eosinophils Relative 3 %   Eosinophils Absolute 0.3 0.0 - 0.5 K/uL   Basophils Relative 1 %   Basophils Absolute 0.1 0.0 - 0.1 K/uL   Immature Granulocytes 0 %   Abs Immature Granulocytes 0.03 0.00 - 0.07 K/uL    Comment: Performed at [facility], [address] [place], [place] [address]  Urine rapid drug screen (hosp performed)  Status: Abnormal   Collection  Time: 04/27/22  1:14 PM  Result Value Ref Range   Opiates NONE DETECTED NONE DETECTED   Cocaine NONE DETECTED NONE DETECTED   Benzodiazepines NONE DETECTED NONE DETECTED   Amphetamines NONE DETECTED NONE DETECTED   Tetrahydrocannabinol POSITIVE (A) NONE DETECTED   Barbiturates NONE DETECTED NONE DETECTED    Comment: (NOTE) DRUG SCREEN FOR MEDICAL PURPOSES ONLY.  IF CONFIRMATION IS NEEDED FOR ANY PURPOSE, NOTIFY LAB WITHIN 5 DAYS.  LOWEST DETECTABLE LIMITS FOR URINE DRUG SCREEN Drug Class                     Cutoff (ng/mL) Amphetamine and metabolites    1000 Barbiturate and metabolites    200 Benzodiazepine                 200 Tricyclics and metabolites     300 Opiates and metabolites        300 Cocaine and metabolites        300 THC                            50 Performed at [facility], [address], [place], [place] [address]   Ethanol     Status: None   Collection Time: 04/27/22  1:30 PM  Result Value Ref Range   Alcohol, Ethyl (B) <10 <10 mg/dL    Comment: (NOTE) Lowest detectable limit for serum alcohol is 10 mg/dL.  For medical purposes only. Performed at [facility], [address] [place], [place] [address]   Resp Panel by RT-PCR (Flu A&B, Covid) Anterior Nasal Swab     Status: None   Collection Time: 04/27/22  1:51 PM   Specimen: Anterior Nasal Swab  Result Value Ref Range   SARS Coronavirus 2 by RT PCR NEGATIVE NEGATIVE    Comment: (NOTE) SARS-CoV-2 target nucleic acids are NOT DETECTED.  The SARS-CoV-2 RNA is generally detectable in upper respiratory specimens during the acute phase of infection. The lowest concentration of SARS-CoV-2 viral copies this assay can detect is 138 copies/mL. A negative result does not preclude SARS-Cov-2 infection and should not be used as the sole basis for treatment or other patient management decisions. A negative result may occur with  improper specimen collection/handling,  submission of specimen other than nasopharyngeal swab, presence of viral mutation(s) within the areas targeted by this assay, and inadequate number of viral copies(<138 copies/mL). A negative result must be combined with clinical observations, patient history, and epidemiological information. The expected result is Negative.  Fact Sheet for Patients:  BloggerCourse.com  Fact Sheet for Healthcare Providers:  SeriousBroker.it  This test is no t yet approved or cleared by the [place] FDA and  has been authorized for detection and/or diagnosis of SARS-CoV-2 by FDA under an Emergency Use Authorization (EUA). This EUA will remain  in effect (meaning this test can be used) for the duration of the COVID-19 declaration under Section 564(b)(1) of the Act, 21 U.S.C.section 360bbb-3(b)(1), unless the authorization is terminated  or revoked sooner.       Influenza A by PCR NEGATIVE NEGATIVE   Influenza B by PCR NEGATIVE NEGATIVE    Comment: (NOTE) The Xpert Xpress SARS-CoV-2/FLU/RSV plus assay is intended as an aid in the diagnosis of influenza from Nasopharyngeal swab specimens and should not be used as a sole basis for treatment. Nasal washings and aspirates are unacceptable for Xpert  Xpress SARS-CoV-2/FLU/RSV testing.  Fact Sheet for Patients: BloggerCourse.com  Fact Sheet for Healthcare Providers: SeriousBroker.it  This test is not yet approved or cleared by the [place] FDA and has been authorized for detection and/or diagnosis of SARS-CoV-2 by FDA under an Emergency Use Authorization (EUA). This EUA will remain in effect (meaning this test can be used) for the duration of the COVID-19 declaration under Section 564(b)(1) of the Act, 21 U.S.C. section 360bbb-3(b)(1), unless the authorization is terminated or revoked.  Performed at [facility], [address], [place], [place] [address]   I-Stat beta hCG blood, ED     Status: None   Collection Time: 04/27/22  1:56 PM  Result Value Ref Range   I-stat hCG, quantitative <5.0 <5 mIU/mL   Comment 3            Comment:   GEST. AGE      CONC.  (mIU/mL)   <=1 WEEK        5 - 50     2 WEEKS       50 - 500     3 WEEKS       100 - 10,000     4 WEEKS     1,000 - 30,000        FEMALE AND NON-PREGNANT FEMALE:     LESS THAN 5 mIU/mL     Blood Alcohol level:  Lab Results  Component Value Date   ETH <10 04/27/2022   ETH <10 04/24/2022    Metabolic Disorder Labs:  Lab Results  Component Value Date   HGBA1C 4.7 (L) 04/18/2022   MPG 88.19 04/18/2022   MPG 97 01/15/2022   Lab Results  Component Value Date   PROLACTIN 189.0 (H) 04/16/2022   PROLACTIN 2.6 (L) 01/15/2022   Lab Results  Component Value Date   CHOL 171 01/15/2022   TRIG 10 01/15/2022   HDL 36 (L) 01/15/2022   CHOLHDL 4.8 01/15/2022   VLDL 2 01/15/2022   LDLCALC 133 (H) 01/15/2022   LDLCALC 131 (H) 10/09/2021    Current Medications: No current facility-administered medications for this encounter.   Current Outpatient Medications  Medication Sig Dispense Refill   nicotine polacrilex (NICORETTE) 2 MG gum Take 1 each (2 mg total) by mouth as needed for smoking cessation. 50 tablet 0   OLANZapine (ZYPREXA) 10 MG tablet Take 1 tablet (10 mg total) by mouth at bedtime. 30 tablet 0   propranolol (INDERAL) 10 MG tablet Take 1 tablet (10 mg total) by mouth 3 (three) times daily. 90 tablet 0   traZODone (DESYREL) 50 MG tablet Take 1 tablet (50 mg total) by mouth at bedtime as needed for sleep. 30 tablet 0   PTA Medications: (Not in a hospital admission)  Musculoskeletal: Strength & Muscle Tone: within normal limits Gait & Station: normal Patient leans: N/A  Psychiatric Specialty Exam:  Presentation  General Appearance: Appropriate for Environment; Casual; Fairly Groomed  Eye Contact:Good  Speech:Other (comment)  (States "I do not know and not sure to most questions.")  Speech Volume:Normal  Handedness:Right  Mood and Affect  Mood:Anxious; Depressed; Dysphoric  Affect:Depressed; Flat  Thought Process  Thought Processes:Linear  Descriptions of Associations:Intact  Orientation:Full (Time, Place and Person)  Thought Content:Scattered; Rumination  History of Schizophrenia/Schizoaffective disorder:Yes  Duration of Psychotic Symptoms:Greater than six months  Hallucinations:Hallucinations: None Description of Visual Hallucinations: Denied  Ideas of Reference:None  Suicidal Thoughts:Suicidal Thoughts: Yes, Passive SI Passive Intent and/or Plan: Without Intent;  Without Plan; Without Means to Carry Out; Without Access to Means  Homicidal Thoughts:Homicidal Thoughts: No  Sensorium  Memory:Immediate Poor; Remote Poor; Recent Poor  Judgment:Impaired  Insight:Lacking  Executive Functions  Concentration:Fair  Attention Span:Fair  Recall:Poor  Fund of Knowledge:Poor  Language:Fair  Psychomotor Activity  Psychomotor Activity:Psychomotor Activity: Normal Extrapyramidal Side Effects (EPS): Other (comment) AIMS Completed?: Yes  Assets  Assets:Communication Skills; Physical Health; Social Support  Sleep  Sleep:Sleep: Fair Number of Hours of Sleep: 0 (States, Not sure)  Physical Exam: Physical Exam Vitals and nursing note reviewed.  Constitutional:      Appearance: Normal appearance.  HENT:     Head: Normocephalic and atraumatic.     Right Ear: External ear normal.     Left Ear: External ear normal.     Mouth/Throat:     Mouth: Mucous membranes are moist.     Pharynx: Oropharynx is clear.  Eyes:     Extraocular Movements: Extraocular movements intact.     Conjunctiva/sclera: Conjunctivae normal.     Pupils: Pupils are equal, round, and reactive to light.  Cardiovascular:     Rate and Rhythm: Normal rate.     Pulses: Normal pulses.     Comments: BP  138/95 Pulmonary:     Effort: Pulmonary effort is normal.  Abdominal:     Palpations: Abdomen is soft.  Genitourinary:    Comments: Deferred Musculoskeletal:        General: Normal range of motion.     Cervical back: Normal range of motion and neck supple.  Skin:    General: Skin is warm.  Neurological:     General: No focal deficit present.     Mental Status: She is alert and oriented to person, place, and time.  Psychiatric:     Comments: Restless     Review of Systems  Constitutional: Negative.  Negative for chills and fever.  HENT: Negative.  Negative for hearing loss and tinnitus.   Eyes: Negative.  Negative for blurred vision and double vision.  Respiratory: Negative.  Negative for cough and hemoptysis.   Cardiovascular: Negative.  Negative for chest pain and palpitations.  Gastrointestinal:  Negative for heartburn and nausea.  Genitourinary: Negative.  Negative for dysuria and urgency.  Musculoskeletal: Negative.  Negative for myalgias and neck pain.  Skin: Negative.  Negative for itching and rash.  Neurological:  Positive for seizures (Hx of seizures). Negative for dizziness, tingling, tremors, sensory change, speech change, focal weakness, weakness and headaches.  Endo/Heme/Allergies:  Negative for environmental allergies and polydipsia. Does not bruise/bleed easily.       Latex Latex  Itching, Rash High Allergy 08/13/2020 Deletion Reason:  Tramadol Tramadol  Itching, Other (See Comments) High Allergy 08/29/2012    Psychiatric/Behavioral:  Positive for suicidal ideas. The patient is nervous/anxious.    Blood pressure (!) 138/95, pulse 90, temperature 98.3 F (36.8 C), temperature source Oral, resp. rate 18, last menstrual period 04/17/2022, SpO2 95 %. There is no height or weight on file to calculate BMI.   Treatment Plan Summary: Daily contact with patient to assess and evaluate symptoms and progress in treatment and Medication management  Observation  Level/Precautions:  15 minute checks Laboratory:  CBC Chemistry Profile GGT UDS UA Psychotherapy:   Medications:   Consultations:   Discharge Concerns:   Estimated LOS: Other:      Cecilie Lowersina C Stark Aguinaga, FNP 6/21/20237:02 PM

## 2022-04-29 ENCOUNTER — Emergency Department (HOSPITAL_COMMUNITY)
Admission: EM | Admit: 2022-04-29 | Discharge: 2022-04-30 | Disposition: A | Discharge status: Home / Self Care | Payer: Medicaid Other | Attending: Emergency Medicine | Admitting: Emergency Medicine

## 2022-04-29 ENCOUNTER — Encounter (HOSPITAL_COMMUNITY): Payer: Self-pay | Admitting: Emergency Medicine

## 2022-04-29 DIAGNOSIS — Z79899 Other long term (current) drug therapy: Secondary | ICD-10-CM | POA: Insufficient documentation

## 2022-04-29 DIAGNOSIS — Z20822 Contact with and (suspected) exposure to covid-19: Secondary | ICD-10-CM | POA: Insufficient documentation

## 2022-04-29 DIAGNOSIS — F29 Unspecified psychosis not due to a substance or known physiological condition: Secondary | ICD-10-CM | POA: Insufficient documentation

## 2022-04-29 DIAGNOSIS — Z91040 Latex allergy status: Secondary | ICD-10-CM | POA: Insufficient documentation

## 2022-04-29 DIAGNOSIS — F411 Generalized anxiety disorder: Secondary | ICD-10-CM | POA: Insufficient documentation

## 2022-04-29 DIAGNOSIS — E039 Hypothyroidism, unspecified: Secondary | ICD-10-CM | POA: Insufficient documentation

## 2022-04-29 DIAGNOSIS — F419 Anxiety disorder, unspecified: Secondary | ICD-10-CM

## 2022-04-29 LAB — COMPREHENSIVE METABOLIC PANEL
ALT: 35 U/L (ref 0–44)
AST: 27 U/L (ref 15–41)
Albumin: 4.8 g/dL (ref 3.5–5.0)
Alkaline Phosphatase: 42 U/L (ref 38–126)
Anion gap: 6 (ref 5–15)
BUN: 14 mg/dL (ref 6–20)
CO2: 24 mmol/L (ref 22–32)
Calcium: 9.9 mg/dL (ref 8.9–10.3)
Chloride: 110 mmol/L (ref 98–111)
Creatinine, Ser: 0.66 mg/dL (ref 0.44–1.00)
GFR, Estimated: >60 mL/min (ref >60)
Glucose, Bld: 127 mg/dL — ABNORMAL HIGH (ref 70–99)
Potassium: 4 mmol/L (ref 3.5–5.1)
Sodium: 140 mmol/L (ref 135–145)
Total Bilirubin: 0.8 mg/dL (ref 0.3–1.2)
Total Protein: 8 g/dL (ref 6.5–8.1)

## 2022-04-29 LAB — CBC WITH DIFFERENTIAL/PLATELET
Abs Immature Granulocytes: 0.04 10*3/uL (ref 0.00–0.07)
Basophils Absolute: 0 10*3/uL (ref 0.0–0.1)
Basophils Relative: 0 %
Eosinophils Absolute: 0.3 10*3/uL (ref 0.0–0.5)
Eosinophils Relative: 3 %
HCT: 39 % (ref 36.0–46.0)
Hemoglobin: 14 g/dL (ref 12.0–15.0)
Immature Granulocytes: 1 %
Lymphocytes Relative: 26 %
Lymphs Abs: 2.1 10*3/uL (ref 0.7–4.0)
MCH: 32.2 pg (ref 26.0–34.0)
MCHC: 35.9 g/dL (ref 30.0–36.0)
MCV: 89.7 fL (ref 80.0–100.0)
Monocytes Absolute: 0.4 10*3/uL (ref 0.1–1.0)
Monocytes Relative: 4 %
Neutro Abs: 5.5 10*3/uL (ref 1.7–7.7)
Neutrophils Relative %: 66 %
Platelets: 299 10*3/uL (ref 150–400)
RBC: 4.35 MIL/uL (ref 3.87–5.11)
RDW: 11.7 % (ref 11.5–15.5)
WBC: 8.3 10*3/uL (ref 4.0–10.5)
nRBC: 0 % (ref 0.0–0.2)

## 2022-04-29 LAB — I-STAT BETA HCG BLOOD, ED (MC, WL, AP ONLY): I-stat hCG, quantitative: <5 m[IU]/mL (ref <5)

## 2022-04-29 LAB — TSH: TSH: 0.526 u[IU]/mL (ref 0.350–4.500)

## 2022-04-29 LAB — URINALYSIS, ROUTINE W REFLEX MICROSCOPIC
Bilirubin Urine: NEGATIVE
Glucose, UA: NEGATIVE mg/dL
Hgb urine dipstick: NEGATIVE
Ketones, ur: NEGATIVE mg/dL
Leukocytes,Ua: NEGATIVE
Nitrite: NEGATIVE
Protein, ur: NEGATIVE mg/dL
Specific Gravity, Urine: 1.018 (ref 1.005–1.030)
pH: 6 (ref 5.0–8.0)

## 2022-04-29 LAB — ETHANOL: Alcohol, Ethyl (B): <10 mg/dL (ref <10)

## 2022-04-29 LAB — RESP PANEL BY RT-PCR (FLU A&B, COVID) ARPGX2
Influenza A by PCR: NEGATIVE
Influenza B by PCR: NEGATIVE
SARS Coronavirus 2 by RT PCR: NEGATIVE

## 2022-04-29 LAB — RAPID URINE DRUG SCREEN, HOSP PERFORMED
Amphetamines: NOT DETECTED
Barbiturates: NOT DETECTED
Benzodiazepines: NOT DETECTED
Cocaine: NOT DETECTED
Opiates: NOT DETECTED
Tetrahydrocannabinol: POSITIVE — AB

## 2022-04-29 MED ORDER — TRAZODONE HCL 100 MG PO TABS
50.0000 mg | ORAL_TABLET | Freq: Every evening | ORAL | Status: DC | PRN
  Administered 2022-04-29: 50 mg via ORAL
  Filled 2022-04-29: qty 1

## 2022-04-29 MED ORDER — NICOTINE POLACRILEX 2 MG MT GUM: 2.0000 mg | CHEWING_GUM | OROMUCOSAL | Status: DC | PRN

## 2022-04-29 MED ORDER — PROPRANOLOL HCL 20 MG PO TABS
10.0000 mg | ORAL_TABLET | Freq: Three times a day (TID) | ORAL | Status: DC
  Administered 2022-04-29: 10 mg via ORAL
  Filled 2022-04-29: qty 1

## 2022-04-29 MED ORDER — OLANZAPINE 10 MG PO TABS
10.0000 mg | ORAL_TABLET | Freq: Every day | ORAL | Status: DC
  Administered 2022-04-29: 10 mg via ORAL
  Filled 2022-04-29: qty 1

## 2022-04-29 MED ORDER — LORAZEPAM 1 MG PO TABS
1.0000 mg | ORAL_TABLET | Freq: Once | ORAL | Status: AC
Start: 2022-04-29 — End: 2022-04-29
  Administered 2022-04-29: 1 mg via ORAL
  Filled 2022-04-29: qty 1

## 2022-04-29 NOTE — ED Provider Notes (Signed)
[facility]-EMERGENCY DEPT Provider Note   CSN: [id number] Arrival date & time: 04/29/22  1121     History  Chief Complaint  Patient presents with   Panic Attack    Umaiza Kawasaki is a 31 y.o. female.  Pt is a 31 yo female with a pmhx significant for polysubstance abuse, schizophrenia, and hypothyroidism.  She presents to the ED today because she felt scared.  She is unable to tell me what has made her scared.  I asked if she has been taking her meds, and she responds with the answer "somewhat."  She said she is tired of hurting, but can't tell me where she's hurting.  No si/hi.       Home Medications Prior to Admission medications   Medication Sig Start Date End Date Taking? Authorizing Provider  nicotine polacrilex (NICORETTE) 2 MG gum Take 1 each (2 mg total) by mouth as needed for smoking cessation. Patient not taking: Reported on 04/30/2022 04/22/22   Sarita Bottom, MD  OLANZapine (ZYPREXA) 10 MG tablet Take 1 tablet (10 mg total) by mouth at bedtime. Patient not taking: Reported on 04/30/2022 04/22/22   Sarita Bottom, MD  OLANZapine (ZYPREXA) 10 MG tablet take 1 tab by mouth at bedtime 04/22/22   Sarita Bottom, MD  propranolol (INDERAL) 10 MG tablet Take 1 tablet (10 mg total) by mouth 3 (three) times daily. Patient not taking: Reported on 04/30/2022 04/22/22   Sarita Bottom, MD  propranolol (INDERAL) 10 MG tablet Take 1 tab by mouth three times daily 04/22/22   Sarita Bottom, MD  traZODone (DESYREL) 50 MG tablet Take 1 tablet (50 mg total) by mouth at bedtime as needed for sleep. Patient not taking: Reported on 04/30/2022 04/22/22   Sarita Bottom, MD  traZODone (DESYREL) 50 MG tablet Take one tablet by mouth at bedtime as needed for sleep 04/22/22   Sarita Bottom, MD      Allergies    Latex and Tramadol    Review of Systems   Review of Systems  Psychiatric/Behavioral:  The patient is nervous/anxious.   All other systems reviewed and are  negative.   Physical Exam Updated Vital Signs BP 102/64 (BP Location: Right Arm)   Pulse 85   Temp 98 F (36.7 C) (Oral)   Resp 16   LMP 04/17/2022 (Exact Date)   SpO2 99%  Physical Exam Vitals and nursing note reviewed.  Constitutional:      Appearance: Normal appearance.  HENT:     Head: Normocephalic and atraumatic.     Right Ear: External ear normal.     Left Ear: External ear normal.     Nose: Nose normal.     Mouth/Throat:     Mouth: Mucous membranes are moist.     Pharynx: Oropharynx is clear.  Eyes:     Extraocular Movements: Extraocular movements intact.     Conjunctiva/sclera: Conjunctivae normal.     Pupils: Pupils are equal, round, and reactive to light.  Cardiovascular:     Rate and Rhythm: Normal rate and regular rhythm.     Pulses: Normal pulses.     Heart sounds: Normal heart sounds.  Pulmonary:     Effort: Pulmonary effort is normal.     Breath sounds: Normal breath sounds.  Abdominal:     General: Abdomen is flat. Bowel sounds are normal.     Palpations: Abdomen is soft.  Musculoskeletal:        General: Normal range of motion.  Cervical back: Normal range of motion and neck supple.  Skin:    General: Skin is warm.     Capillary Refill: Capillary refill takes less than 2 seconds.  Neurological:     General: No focal deficit present.     Mental Status: She is alert and oriented to person, place, and time.  Psychiatric:        Mood and Affect: Mood normal.        Behavior: Behavior normal.     ED Results / Procedures / Treatments   Labs (all labs ordered are listed, but only abnormal results are displayed) Labs Reviewed  COMPREHENSIVE METABOLIC PANEL - Abnormal; Notable for the following components:      Result Value   Glucose, Bld 127 (*)    All other components within normal limits  RAPID URINE DRUG SCREEN, HOSP PERFORMED - Abnormal; Notable for the following components:   Tetrahydrocannabinol POSITIVE (*)    All other components  within normal limits  URINALYSIS, ROUTINE W REFLEX MICROSCOPIC - Abnormal; Notable for the following components:   APPearance CLOUDY (*)    Bacteria, UA RARE (*)    All other components within normal limits  RESP PANEL BY RT-PCR (FLU A&B, COVID) ARPGX2  ETHANOL  CBC WITH DIFFERENTIAL/PLATELET  TSH  I-STAT BETA HCG BLOOD, ED ([facility], [facility], [facility] ONLY)    EKG None  Radiology No results found.  Procedures Procedures    Medications Ordered in ED Medications  LORazepam (ATIVAN) tablet 1 mg (1 mg Oral Given 04/29/22 2058)    ED Course/ Medical Decision Making/ A&P                           Medical Decision Making Amount and/or Complexity of Data Reviewed Labs: ordered.   This patient presents to the ED for concern of anxiety, this involves an extensive number of treatment options, and is a complaint that carries with it a high risk of complications and morbidity.  The differential diagnosis includes psychiatric d/o, electrolyte d/o, preg   Co morbidities that complicate the patient evaluation  polysubstance abuse, schizophrenia, and hypothyroidism   Additional history obtained:  Additional history obtained from epic chart review   Lab Tests:  I Ordered, and personally interpreted labs.  The pertinent results include:  uds+ mj, ua neg, covid/flu neg, cmp nl, cbc nl, etoh neg  Consultations Obtained:  I requested consultation with the tts,  and discussed lab and imaging findings as well as pertinent plan -consult pending  Problem List / ED Course:  Bizarre behavior:  medically clear.  Awaiting TTS consult   Dispostion:  Per psych        Final Clinical Impression(s) / ED Diagnoses Final diagnoses:  Anxiety    Rx / DC Orders ED Discharge Orders     None         Jacalyn Lefevre, MD 05/08/22 1500

## 2022-04-29 NOTE — ED Triage Notes (Signed)
[facility] here [facility] reporting panic attack that started when she went in. Lethargic when picked up. Unsure of meds - if she took meds this morning. "I just dont feel right".

## 2022-04-29 NOTE — ED Provider Triage Note (Cosign Needed)
Emergency Medicine Provider Triage Evaluation Note  Barbara Gordon , a 31 y.o. female  was evaluated in triage.  Pt complains of "feeling scared" and anxiety.  Patient states that she is unsure of what she is scared of.  She is unable to tell me why she thinks she is anxious.  She typically answers in 1 or 2 word answers.  She denies other complaints right now continuing to state she is scared  Review of Systems  Positive: Anxiety Negative: Chest pain  Physical Exam  BP (!) 141/91 (BP Location: Right Arm)   Pulse 90   Temp 98.1 F (36.7 C) (Oral)   Resp 14   LMP 04/17/2022 (Exact Date)   SpO2 95%  Gen:   Awake, intermittently appears comfortable and then tearful and scared Resp:  Normal effort  MSK:   Moves extremities without difficulty  Other:    Medical Decision Making  Medically screening exam initiated at 11:50 AM.  Appropriate orders placed.  Suella Whitelock was informed that the remainder of the evaluation will be completed by another provider, this initial triage assessment does not replace that evaluation, and the importance of remaining in the ED until their evaluation is complete.     Darrick Grinder, PA-C 04/29/22 1151

## 2022-04-29 NOTE — ED Notes (Signed)
Pt has been changed into purple scrubs.  Belongings have been placed in the nurses station cabinet on the 23-25 side.

## 2022-04-30 NOTE — ED Provider Notes (Signed)
Emergency Medicine Observation Re-evaluation Note  Barbara Gordon is a 31 y.o. female, seen on rounds today.  Pt initially presented to the ED for complaints of Panic Attack Currently, the patient is resting comfortably.  Physical Exam  BP 95/67 (BP Location: Left Arm)   Pulse 87   Temp 98.1 F (36.7 C) (Oral)   Resp 17   LMP 04/17/2022 (Exact Date)   SpO2 98%  Physical Exam General: Well-appearing  Psych: No SI or HI  ED Course / MDM  EKG:   I have reviewed the labs performed to date as well as medications administered while in observation.  Recent changes in the last 24 hours include patient seen by psychiatry and cleared..  Plan  Current plan is for discharge.  Barbara Gordon is not under involuntary commitment.     Lorre Nick, MD 04/30/22 [phone]

## 2022-05-01 NOTE — Discharge Summary (Addendum)
Physician Discharge Summary  Barbara Gordon QQV:[id number] DOB: [birth year] DOA: 04/17/2022  PCP: Oneita Hurt, No  Admit date: 04/09/2022 Discharge date: 04/17/2022  Admitted From: Home Disposition:  [facility]   Brief/Interim Summary: 31 year old female with schizophrenia, polysubstance abuse, hypothyroidism comes into the hospital with acute encephalopathy.  She was waiting on psych hold in the ED, and apparently received a visit from her boyfriend on 6/2 and became more confused, agitated, prompting [facility] to request medical admission to the hospital.  Acute toxic encephalopathy: -she had worsening confusion agitation while in psych hold after she took unspecified street drug from her boyfriend.  Still requiring medications and one-to-one sitter.   -UDS was positive for benzodiazepines and THC on admission.   -Patient medically ready for discharge to inpatient psych   Hypothyroidism, ruled out -chart review in terms related to this diagnosis reveals normal TSH in the past up until March 2023, when she was admitted at [facility], and her TSH was found to be boderline low at 0.2, with normal free T3 and T4 levels, suggesting sick euthyroid syndrome versus, less likely, secondary hypothyroidism, but no other work-up that I can see was pursued.  She was placed on thyroid supplementation with Cytomel, TSH looks a little bit more suppressed now, free T3 elevated, which is expected given Cytomel.  -Stop thyroid supplementations as she is not established to be hypothyroid, primary or secondary.  Recheck TSH, free T3 and free T4 in 3 to 4 weeks   Elevated Prolactin:  -Could be secondary to antipsychotic medications   Abnormal MRI March 2023 -MRI done in March 2023 when she was admitted to [facility] was concerning for small areas of increased T2/flair signal in the right temporal and further context, nonspecific.   -Neurology consulted thinks that MRI changes consistent with sequela of TBI.  Recommend optimize medical conditions  such as stopping unnecessary thyroid treatment.  Continue Lamictal and titrating towards a goal of 100 mg twice daily.  Neurology do not feel LP necessary at this time.  No other acute neurodiagnostic testing is recommended -Appreciate neurology consult   Metabolic acidosis-Resolved   Mildly elevated CK level-resolved   Vaginal pain-nonspecific, UA unremarkable.  GC and Chlamydia negative   Schizophrenia -Being managed by behavioral health  Patient transferred to [facility] in stable condition.  Discharge Diagnoses:  Acute toxic encephalopathy Low TSH Elevated prolactin Abnormal MRI brain Metabolic acidosis Mild elevation of CK level Vaginal pain Schizophrenia, paranoid, chronic  Discharge Instructions  Discharge Instructions     Diet - low sodium heart healthy   Complete by: As directed    Increase activity slowly   Complete by: As directed       Allergies as of 04/22/2022       Reactions   Latex Itching, Rash   Tramadol Itching, Other (See Comments)   Provider: Crist Fat CFM - Allergy Description: TraMADol HCl *ANALGESICS - OPIOID* CFM - Allergy Annotation: Pruritus.        Medication List     STOP taking these medications    benztropine 0.5 MG tablet Commonly known as: COGENTIN   FLUoxetine 10 MG capsule Commonly known as: PROZAC   haloperidol 10 MG tablet Commonly known as: HALDOL   lamoTRIgine 25 MG tablet Commonly known as: LAMICTAL   liothyronine 25 MCG tablet Commonly known as: CYTOMEL   thiamine 100 MG tablet       TAKE these medications    nicotine polacrilex 2 MG gum Commonly known as: NICORETTE Take 1 each (2 mg  total) by mouth as needed for smoking cessation.   OLANZapine 10 MG tablet Commonly known as: ZYPREXA Take 1 tablet (10 mg total) by mouth at bedtime.   propranolol 10 MG tablet Commonly known as: INDERAL Take 1 tablet (10 mg total) by mouth 3 (three) times daily.   traZODone 50 MG tablet Commonly known as:  DESYREL Take 1 tablet (50 mg total) by mouth at bedtime as needed for sleep.        Follow-up Information     [facility] Follow up.   Specialty: Professional Counselor Why: You may go to this provider for therapy and medication management services during walk in hours for new patients:  Monday through Friday, from 9:00 am to 1:00 pm. Contact information: [address] [place] [address] [phone]         [facility] Call.   Why: A referral for services for their ACTT has been done on your behalf.  They currently have a 3 week wating period to get linked with services.  Please follow up with this provider to get connected. Contact information: [address] [place] [address] [phone]         [facility]. Go to.   Why: You can go here for affordable medications.  Please go to this location for medication refills. Contact information: [address] [place], [place] [address]               Allergies  Allergen Reactions   Latex Itching and Rash   Tramadol Itching and Other (See Comments)    Provider: Crist Fat CFM - Allergy Description: TraMADol HCl *ANALGESICS - OPIOID* CFM - Allergy Annotation: Pruritus.     Consultations: Psych neurology   Procedures/Studies: CT HEAD WO CONTRAST ( )  Result Date: 04/24/2022 CLINICAL DATA:  Mental status change. EXAM: CT HEAD WITHOUT CONTRAST TECHNIQUE: Contiguous axial images were obtained from the base of the skull through the vertex without intravenous contrast. RADIATION DOSE REDUCTION: This exam was performed according to the departmental dose-optimization program which includes automated exposure control, adjustment of the mA and/or kV according to patient size and/or use of iterative reconstruction technique. COMPARISON:  None Available. FINDINGS: Brain: No acute infarct, hemorrhage, or mass lesion is present. No  significant white matter lesions are present. The ventricles are of normal size. No significant extraaxial fluid collection is present. The brainstem and cerebellum are within normal limits. Vascular: No hyperdense vessel or unexpected calcification. Skull: Calvarium is intact. No focal lytic or blastic lesions are present. No significant extracranial soft tissue lesion is present. Sinuses/Orbits: The paranasal sinuses and mastoid air cells are clear. The globes and orbits are within normal limits. IMPRESSION: Negative CT of the head. Electronically Signed   By: Marin Roberts M.D.   On: 04/24/2022 19:43   DG Chest Port 1 View  Result Date: 04/10/2022 CLINICAL DATA:  Acute encephalopathy. EXAM: PORTABLE CHEST 1 VIEW COMPARISON:  None Available. FINDINGS: The heart size and mediastinal contours are within normal limits. Both lungs are clear. The visualized skeletal structures are unremarkable. IMPRESSION: No active disease. Electronically Signed   By: Danae Orleans M.D.   On: 04/10/2022 08:39      Subjective: Patient seen and examined on the day of discharge.  Sitting comfortably on the bed and eating breakfast.  Sitter was at bedside.  Patient denies any complaints.  Discharge Exam: Vitals:   04/22/22 0615 04/22/22 1100  BP: 101/67 131/90  Pulse: 90 89  Resp:    Temp:  97.7 F (36.5 C)  SpO2: 97% 95%   Vitals:   04/21/22 1705 04/22/22 0612 04/22/22 0615 04/22/22 1100  BP: 114/66 113/83 101/67 131/90  Pulse: 80 88 90 89  Resp: 16     Temp:  98.2 F (36.8 C)  97.7 F (36.5 C)  TempSrc:  Oral  Oral  SpO2: 96% 99% 97% 95%  Weight:      Height:        General: Pt is alert, awake, not in acute distress, flat affect Cardiovascular: RRR, S1/S2 +, no rubs, no gallops Respiratory: CTA bilaterally, no wheezing, no rhonchi Abdominal: Soft, NT, ND, bowel sounds + Extremities: no edema, no cyanosis    The results of significant diagnostics from this hospitalization (including  imaging, microbiology, ancillary and laboratory) are listed below for reference.     Microbiology: Recent Results (from the past 240 hour(s))  Resp Panel by RT-PCR (Flu A&B, Covid) Anterior Nasal Swab     Status: None   Collection Time: 04/27/22  1:51 PM   Specimen: Anterior Nasal Swab  Result Value Ref Range Status   SARS Coronavirus 2 by RT PCR NEGATIVE NEGATIVE Final    Comment: (NOTE) SARS-CoV-2 target nucleic acids are NOT DETECTED.  The SARS-CoV-2 RNA is generally detectable in upper respiratory specimens during the acute phase of infection. The lowest concentration of SARS-CoV-2 viral copies this assay can detect is 138 copies/mL. A negative result does not preclude SARS-Cov-2 infection and should not be used as the sole basis for treatment or other patient management decisions. A negative result may occur with  improper specimen collection/handling, submission of specimen other than nasopharyngeal swab, presence of viral mutation(s) within the areas targeted by this assay, and inadequate number of viral copies(<138 copies/mL). A negative result must be combined with clinical observations, patient history, and epidemiological information. The expected result is Negative.  Fact Sheet for Patients:  BloggerCourse.com  Fact Sheet for Healthcare Providers:  SeriousBroker.it  This test is no t yet approved or cleared by the [place] FDA and  has been authorized for detection and/or diagnosis of SARS-CoV-2 by FDA under an Emergency Use Authorization (EUA). This EUA will remain  in effect (meaning this test can be used) for the duration of the COVID-19 declaration under Section 564(b)(1) of the Act, 21 U.S.C.section 360bbb-3(b)(1), unless the authorization is terminated  or revoked sooner.       Influenza A by PCR NEGATIVE NEGATIVE Final   Influenza B by PCR NEGATIVE NEGATIVE Final    Comment: (NOTE) The Xpert Xpress  SARS-CoV-2/FLU/RSV plus assay is intended as an aid in the diagnosis of influenza from Nasopharyngeal swab specimens and should not be used as a sole basis for treatment. Nasal washings and aspirates are unacceptable for Xpert Xpress SARS-CoV-2/FLU/RSV testing.  Fact Sheet for Patients: BloggerCourse.com  Fact Sheet for Healthcare Providers: SeriousBroker.it  This test is not yet approved or cleared by the [place] FDA and has been authorized for detection and/or diagnosis of SARS-CoV-2 by FDA under an Emergency Use Authorization (EUA). This EUA will remain in effect (meaning this test can be used) for the duration of the COVID-19 declaration under Section 564(b)(1) of the Act, 21 U.S.C. section 360bbb-3(b)(1), unless the authorization is terminated or revoked.  Performed at [facility], [address], [place], [place] [address]   Resp Panel by RT-PCR (Flu A&B, Covid) Anterior Nasal Swab     Status: None  Collection Time: 04/29/22  8:11 PM   Specimen: Anterior Nasal Swab  Result Value Ref Range Status   SARS Coronavirus 2 by RT PCR NEGATIVE NEGATIVE Final    Comment: (NOTE) SARS-CoV-2 target nucleic acids are NOT DETECTED.  The SARS-CoV-2 RNA is generally detectable in upper respiratory specimens during the acute phase of infection. The lowest concentration of SARS-CoV-2 viral copies this assay can detect is 138 copies/mL. A negative result does not preclude SARS-Cov-2 infection and should not be used as the sole basis for treatment or other patient management decisions. A negative result may occur with  improper specimen collection/handling, submission of specimen other than nasopharyngeal swab, presence of viral mutation(s) within the areas targeted by this assay, and inadequate number of viral copies(<138 copies/mL). A negative result must be combined with clinical observations, patient history,  and epidemiological information. The expected result is Negative.  Fact Sheet for Patients:  BloggerCourse.com  Fact Sheet for Healthcare Providers:  SeriousBroker.it  This test is no t yet approved or cleared by the [place] FDA and  has been authorized for detection and/or diagnosis of SARS-CoV-2 by FDA under an Emergency Use Authorization (EUA). This EUA will remain  in effect (meaning this test can be used) for the duration of the COVID-19 declaration under Section 564(b)(1) of the Act, 21 U.S.C.section 360bbb-3(b)(1), unless the authorization is terminated  or revoked sooner.       Influenza A by PCR NEGATIVE NEGATIVE Final   Influenza B by PCR NEGATIVE NEGATIVE Final    Comment: (NOTE) The Xpert Xpress SARS-CoV-2/FLU/RSV plus assay is intended as an aid in the diagnosis of influenza from Nasopharyngeal swab specimens and should not be used as a sole basis for treatment. Nasal washings and aspirates are unacceptable for Xpert Xpress SARS-CoV-2/FLU/RSV testing.  Fact Sheet for Patients: BloggerCourse.com  Fact Sheet for Healthcare Providers: SeriousBroker.it  This test is not yet approved or cleared by the [place] FDA and has been authorized for detection and/or diagnosis of SARS-CoV-2 by FDA under an Emergency Use Authorization (EUA). This EUA will remain in effect (meaning this test can be used) for the duration of the COVID-19 declaration under Section 564(b)(1) of the Act, 21 U.S.C. section 360bbb-3(b)(1), unless the authorization is terminated or revoked.  Performed at [facility], [address], [place], [place] [address]      Labs: BNP (last 3 results) No results for input(s): "BNP" in the last 8760 hours. Basic Metabolic Panel: Recent Labs  Lab 04/24/22 1903 04/27/22 1307 04/29/22 1217  NA 142 141 140  K 3.8 4.3 4.0  CL  105 107 110  CO2 27 24 24   GLUCOSE 95 98 127*  BUN 10 13 14   CREATININE 0.74 0.62 0.66  CALCIUM 10.5* 10.6* 9.9   Liver Function Tests: Recent Labs  Lab 04/24/22 1903 04/27/22 1307 04/29/22 1217  AST 25 31 27   ALT 34 43 35  ALKPHOS 48 50 42  BILITOT 0.9 0.8 0.8  PROT 8.2* 8.7* 8.0  ALBUMIN 4.8 5.0 4.8   No results for input(s): "LIPASE", "AMYLASE" in the last 168 hours. No results for input(s): "AMMONIA" in the last 168 hours. CBC: Recent Labs  Lab 04/24/22 1903 04/27/22 1307 04/29/22 1217  WBC 9.6 10.7* 8.3  NEUTROABS 4.5 7.1 5.5  HGB 14.4 15.6* 14.0  HCT 39.1 42.6 39.0  MCV 88.3 88.9 89.7  PLT 329 382 299   Cardiac Enzymes: No results for input(s): "CKTOTAL", "CKMB", "CKMBINDEX", "TROPONINI" in the last  168 hours. BNP: Invalid input(s): "POCBNP" CBG: No results for input(s): "GLUCAP" in the last 168 hours. D-Dimer No results for input(s): "DDIMER" in the last 72 hours. Hgb A1c No results for input(s): "HGBA1C" in the last 72 hours. Lipid Profile No results for input(s): "CHOL", "HDL", "LDLCALC", "TRIG", "CHOLHDL", "LDLDIRECT" in the last 72 hours. Thyroid function studies Recent Labs    04/29/22 1217  TSH 0.526   Anemia work up No results for input(s): "VITAMINB12", "FOLATE", "FERRITIN", "TIBC", "IRON", "RETICCTPCT" in the last 72 hours. Urinalysis    Component Value Date/Time   COLORURINE YELLOW 04/29/2022 2103   APPEARANCEUR CLOUDY (A) 04/29/2022 2103   LABSPEC 1.018 04/29/2022 2103   PHURINE 6.0 04/29/2022 2103   GLUCOSEU NEGATIVE 04/29/2022 2103   HGBUR NEGATIVE 04/29/2022 2103   BILIRUBINUR NEGATIVE 04/29/2022 2103   KETONESUR NEGATIVE 04/29/2022 2103   PROTEINUR NEGATIVE 04/29/2022 2103   UROBILINOGEN 1.0 06/23/2009 1520   NITRITE NEGATIVE 04/29/2022 2103   LEUKOCYTESUR NEGATIVE 04/29/2022 2103   Sepsis Labs Recent Labs  Lab 04/24/22 1903 04/27/22 1307 04/29/22 1217  WBC 9.6 10.7* 8.3   Microbiology Recent Results (from the past  240 hour(s))  Resp Panel by RT-PCR (Flu A&B, Covid) Anterior Nasal Swab     Status: None   Collection Time: 04/27/22  1:51 PM   Specimen: Anterior Nasal Swab  Result Value Ref Range Status   SARS Coronavirus 2 by RT PCR NEGATIVE NEGATIVE Final    Comment: (NOTE) SARS-CoV-2 target nucleic acids are NOT DETECTED.  The SARS-CoV-2 RNA is generally detectable in upper respiratory specimens during the acute phase of infection. The lowest concentration of SARS-CoV-2 viral copies this assay can detect is 138 copies/mL. A negative result does not preclude SARS-Cov-2 infection and should not be used as the sole basis for treatment or other patient management decisions. A negative result may occur with  improper specimen collection/handling, submission of specimen other than nasopharyngeal swab, presence of viral mutation(s) within the areas targeted by this assay, and inadequate number of viral copies(<138 copies/mL). A negative result must be combined with clinical observations, patient history, and epidemiological information. The expected result is Negative.  Fact Sheet for Patients:  BloggerCourse.com  Fact Sheet for Healthcare Providers:  SeriousBroker.it  This test is no t yet approved or cleared by the [place] FDA and  has been authorized for detection and/or diagnosis of SARS-CoV-2 by FDA under an Emergency Use Authorization (EUA). This EUA will remain  in effect (meaning this test can be used) for the duration of the COVID-19 declaration under Section 564(b)(1) of the Act, 21 U.S.C.section 360bbb-3(b)(1), unless the authorization is terminated  or revoked sooner.       Influenza A by PCR NEGATIVE NEGATIVE Final   Influenza B by PCR NEGATIVE NEGATIVE Final    Comment: (NOTE) The Xpert Xpress SARS-CoV-2/FLU/RSV plus assay is intended as an aid in the diagnosis of influenza from Nasopharyngeal swab specimens and should not  be used as a sole basis for treatment. Nasal washings and aspirates are unacceptable for Xpert Xpress SARS-CoV-2/FLU/RSV testing.  Fact Sheet for Patients: BloggerCourse.com  Fact Sheet for Healthcare Providers: SeriousBroker.it  This test is not yet approved or cleared by the [place] FDA and has been authorized for detection and/or diagnosis of SARS-CoV-2 by FDA under an Emergency Use Authorization (EUA). This EUA will remain in effect (meaning this test can be used) for the duration of the COVID-19 declaration under Section 564(b)(1) of the Act, 21 U.S.C. section 360bbb-3(b)(1),  unless the authorization is terminated or revoked.  Performed at [facility], [address], [place], [place] [address]   Resp Panel by RT-PCR (Flu A&B, Covid) Anterior Nasal Swab     Status: None   Collection Time: 04/29/22  8:11 PM   Specimen: Anterior Nasal Swab  Result Value Ref Range Status   SARS Coronavirus 2 by RT PCR NEGATIVE NEGATIVE Final    Comment: (NOTE) SARS-CoV-2 target nucleic acids are NOT DETECTED.  The SARS-CoV-2 RNA is generally detectable in upper respiratory specimens during the acute phase of infection. The lowest concentration of SARS-CoV-2 viral copies this assay can detect is 138 copies/mL. A negative result does not preclude SARS-Cov-2 infection and should not be used as the sole basis for treatment or other patient management decisions. A negative result may occur with  improper specimen collection/handling, submission of specimen other than nasopharyngeal swab, presence of viral mutation(s) within the areas targeted by this assay, and inadequate number of viral copies(<138 copies/mL). A negative result must be combined with clinical observations, patient history, and epidemiological information. The expected result is Negative.  Fact Sheet for Patients:   BloggerCourse.com  Fact Sheet for Healthcare Providers:  SeriousBroker.it  This test is no t yet approved or cleared by the [place] FDA and  has been authorized for detection and/or diagnosis of SARS-CoV-2 by FDA under an Emergency Use Authorization (EUA). This EUA will remain  in effect (meaning this test can be used) for the duration of the COVID-19 declaration under Section 564(b)(1) of the Act, 21 U.S.C.section 360bbb-3(b)(1), unless the authorization is terminated  or revoked sooner.       Influenza A by PCR NEGATIVE NEGATIVE Final   Influenza B by PCR NEGATIVE NEGATIVE Final    Comment: (NOTE) The Xpert Xpress SARS-CoV-2/FLU/RSV plus assay is intended as an aid in the diagnosis of influenza from Nasopharyngeal swab specimens and should not be used as a sole basis for treatment. Nasal washings and aspirates are unacceptable for Xpert Xpress SARS-CoV-2/FLU/RSV testing.  Fact Sheet for Patients: BloggerCourse.com  Fact Sheet for Healthcare Providers: SeriousBroker.it  This test is not yet approved or cleared by the [place] FDA and has been authorized for detection and/or diagnosis of SARS-CoV-2 by FDA under an Emergency Use Authorization (EUA). This EUA will remain in effect (meaning this test can be used) for the duration of the COVID-19 declaration under Section 564(b)(1) of the Act, 21 U.S.C. section 360bbb-3(b)(1), unless the authorization is terminated or revoked.  Performed at [facility], [address] [place], [place] [address]      Time coordinating discharge: Over 30 minutes  SIGNED:   Ollen Bowl, MD  [facility] 04/17/2022, 2:55 PM Pager   If 7PM-7AM, please contact night-coverage [email or web address]

## 2022-05-04 ENCOUNTER — Other Ambulatory Visit: Payer: Self-pay

## 2022-05-04 MED ORDER — PROPRANOLOL HCL 10 MG PO TABS
ORAL_TABLET | ORAL | 0 refills | Status: DC
  Filled 2022-05-04: qty 90, 30d supply, fill #0

## 2022-05-04 MED ORDER — OLANZAPINE 10 MG PO TABS
ORAL_TABLET | ORAL | 0 refills | Status: DC
  Filled 2022-05-04: qty 30, 30d supply, fill #0

## 2022-05-04 MED ORDER — TRAZODONE HCL 50 MG PO TABS
ORAL_TABLET | ORAL | 0 refills | Status: DC
  Filled 2022-05-04: qty 30, 30d supply, fill #0

## 2022-07-01 ENCOUNTER — Encounter: Payer: Self-pay | Admitting: Physician Assistant

## 2022-07-01 ENCOUNTER — Ambulatory Visit: Payer: Medicaid Other | Attending: Physician Assistant | Admitting: Physician Assistant

## 2022-07-01 VITALS — BP 92/59 | HR 78 | Temp 98.5°F | Resp 23 | Ht 61.5 in | Wt 130.0 lb

## 2022-07-01 DIAGNOSIS — Z09 Encounter for follow-up examination after completed treatment for conditions other than malignant neoplasm: Secondary | ICD-10-CM

## 2022-07-01 DIAGNOSIS — B182 Chronic viral hepatitis C: Secondary | ICD-10-CM

## 2022-07-01 DIAGNOSIS — F1910 Other psychoactive substance abuse, uncomplicated: Secondary | ICD-10-CM

## 2022-07-01 NOTE — Progress Notes (Signed)
Patient ID: Barbara Gordon, female   DOB: 1991/03/25, 31 y.o.   MRN: [id number]   Rebacca Votaw, is a 31 y.o. female  SAY:[id number]  ATF:[id number]  DOB - 1991-09-29  Chief Complaint  Patient presents with   Hospitalization Follow-up    Patient states no concerns       Subjective:   Barbara Gordon is a 31 y.o. female here today for a follow up visit and to establish care.  She as had multiple ED visits and hospitalizations mostly related to polysubstance abuse and mental health issues and the complications thereof.  She is not taking any medications she has been previously been prescribed.  She is not amenable to restarting any meds prescribed.  She is not open to having the social worker reach out.  She says she feels good.  Denies any light-headedness, dizziness, SOB.  Denies any substances today including alcohol.  I asked this several times throughout the visit.   She wants referral for ID for +hep c titer about 5 months ago.     From ED visit 04/29/2022: Pt is a 31 yo female with a pmhx significant for polysubstance abuse, schizophrenia, and hypothyroidism.  She presents to the ED today because she felt scared.  She is unable to tell me what has made her scared.  I asked if she has been taking her meds, and she responds with the answer "somewhat."  She said she is tired of hurting, but can't tell me where she's hurting.  No si/hi. No problems updated.  ALLERGIES: Allergies  Allergen Reactions   Latex Itching and Rash   Tramadol Itching and Other (See Comments)    Provider: Crist Fat CFM - Allergy Description: TraMADol HCl *ANALGESICS - OPIOID* CFM - Allergy Annotation: Pruritus.     PAST MEDICAL HISTORY: Past Medical History:  Diagnosis Date   Hypothyroidism    Polysubstance abuse (HCC)    Schizophrenia (HCC)     MEDICATIONS AT HOME: Prior to Admission medications   Medication Sig Start Date End Date Taking? Authorizing Provider  nicotine polacrilex (NICORETTE) 2  MG gum Take 1 each (2 mg total) by mouth as needed for smoking cessation. Patient not taking: Reported on 04/30/2022 04/22/22   Sarita Bottom, MD  OLANZapine (ZYPREXA) 10 MG tablet Take 1 tablet (10 mg total) by mouth at bedtime. Patient not taking: Reported on 04/30/2022 04/22/22   Sarita Bottom, MD  OLANZapine (ZYPREXA) 10 MG tablet take 1 tab by mouth at bedtime Patient not taking: Reported on 07/01/2022 04/22/22   Sarita Bottom, MD  propranolol (INDERAL) 10 MG tablet Take 1 tablet (10 mg total) by mouth 3 (three) times daily. Patient not taking: Reported on 04/30/2022 04/22/22   Sarita Bottom, MD  propranolol (INDERAL) 10 MG tablet Take 1 tab by mouth three times daily Patient not taking: Reported on 07/01/2022 04/22/22   Sarita Bottom, MD  traZODone (DESYREL) 50 MG tablet Take 1 tablet (50 mg total) by mouth at bedtime as needed for sleep. Patient not taking: Reported on 04/30/2022 04/22/22   Sarita Bottom, MD  traZODone (DESYREL) 50 MG tablet Take one tablet by mouth at bedtime as needed for sleep Patient not taking: Reported on 07/01/2022 04/22/22   Sarita Bottom, MD    ROS: Neg HEENT Neg resp Neg cardiac Neg GI Neg GU Neg MS Neg psych Neg neuro  Objective:   Vitals:   07/01/22 1543  BP: (!) 92/59  Pulse: 78  Resp: (!) 23  Temp: 98.5 F (36.9 C)  SpO2: 99%  Weight: 130 lb (59 kg)  Height: 5' 1.5" (1.562 m)   Exam General appearance : Awake, alert, not in any distress. Speech Clear. Not toxic looking HEENT: Atraumatic and Normocephalic, pupils equally reactive to light and accomodation Neck: Supple, no JVD. No cervical lymphadenopathy.  Chest: Good air entry bilaterally, CTAB.  No rales/rhonchi/wheezing CVS: S1 S2 regular, no murmurs. Rate at 92bpm on auscultation Extremities: B/L Lower Ext shows no edema, both legs are warm to touch Neurology: Awake alert, and oriented X 3, CN II-XII intact, Non focal Skin: No Rash  Data Review Lab Results  Component Value Date   HGBA1C  4.7 (L) 04/18/2022   HGBA1C 5.0 01/15/2022   HGBA1C 5.0 10/09/2021    Assessment & Plan   1. Chronic hepatitis C without hepatic coma (HCC) - Ambulatory referral to Infectious Disease  2. Polysubstance abuse (HCC) [employer]org is the website for narcotics anonymous [employer].cy (website) or 313-819-0882 is the information for alcoholics anonymous Both are free and immediately available for help with alcohol and drug use I have counseled the patient at length about substance abuse and addiction.  12 step meetings/recovery recommended.  Local 12 step meeting lists were given and attendance was encouraged.  Patient expresses understanding.     3. Encounter for examination following treatment at hospital    Return in about 2 months (around 08/31/2022) for assign PCP .  The patient was given clear instructions to go to ER or return to medical center if symptoms don't improve, worsen or new problems develop. The patient verbalized understanding. The patient was told to call to get lab results if they haven't heard anything in the next week.      Georgian Co, PA-C [facility] and [facility] [place], [place] [phone]   07/01/2022, 5:09 PM

## 2022-07-05 ENCOUNTER — Other Ambulatory Visit (HOSPITAL_COMMUNITY): Payer: Self-pay

## 2022-07-05 ENCOUNTER — Telehealth: Payer: Self-pay

## 2022-07-05 NOTE — Telephone Encounter (Signed)
[facility] Patient Advocate Encounter ? ?Insurance verification completed.   ? ?The patient is uninsured and will need patient assistance for medication. ? ?We can complete the application and will need to meet with the patient for signatures and income documentation. ? ?Darcy Barbara, CPhT ?Specialty Pharmacy Patient Advocate ?[facility] ?Phone: [phone] ?Fax:  [phone]  ?

## 2022-07-07 ENCOUNTER — Encounter: Payer: Medicaid Other | Admitting: Infectious Diseases

## 2022-07-13 ENCOUNTER — Encounter: Payer: Medicaid Other | Admitting: Family

## 2022-08-31 ENCOUNTER — Ambulatory Visit: Payer: Medicaid Other | Admitting: Internal Medicine

## 2022-09-03 ENCOUNTER — Ambulatory Visit: Payer: Medicaid Other | Admitting: Nurse Practitioner

## 2023-02-25 IMAGING — CT CT HEAD W/O CM
3 series · 16 of 47 positions shown, 19 images · non-contrast
Comparison: None Available.

CLINICAL DATA: Mental status change.



[Series 2: head wo · axial · 0.43mm/px · z∈[+1442,+1582]mm · 10 of 34 slices shown, 13 images]
[im 3/34  brain]
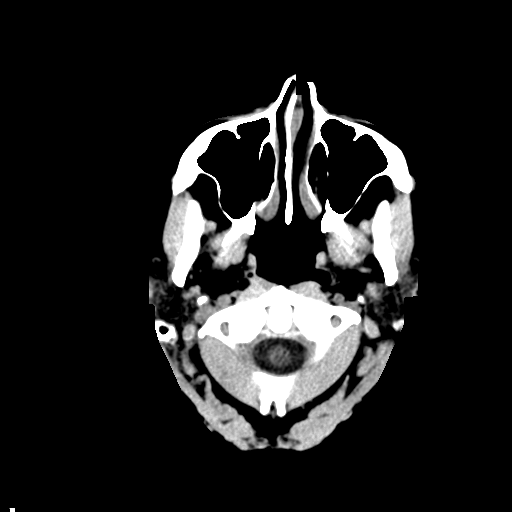
[im 3/34  bone]
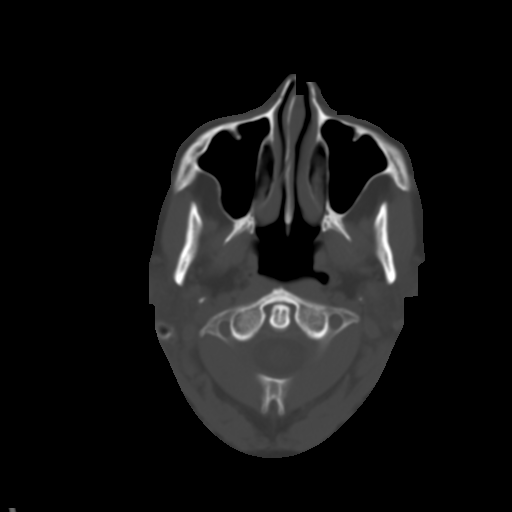
[im 6/34  brain]
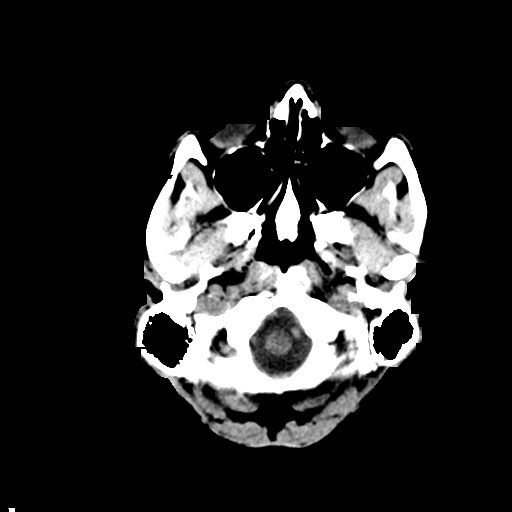
[im 10/34  brain]
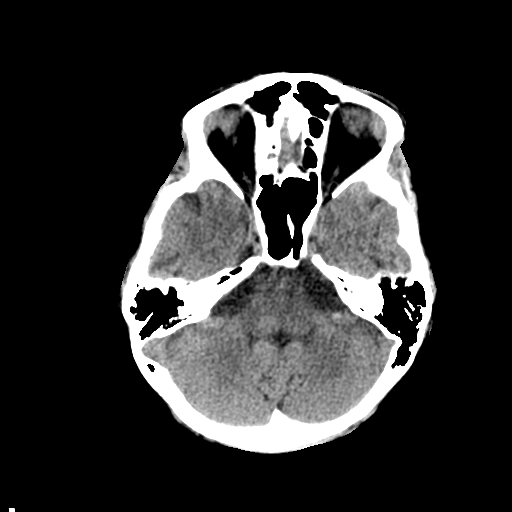
[im 12/34  brain]
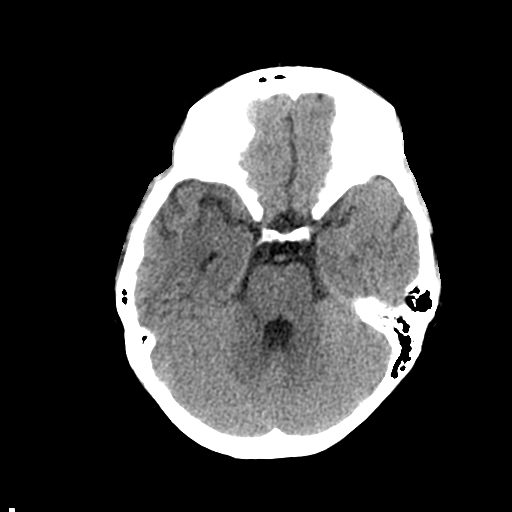
[im 15/34  brain]
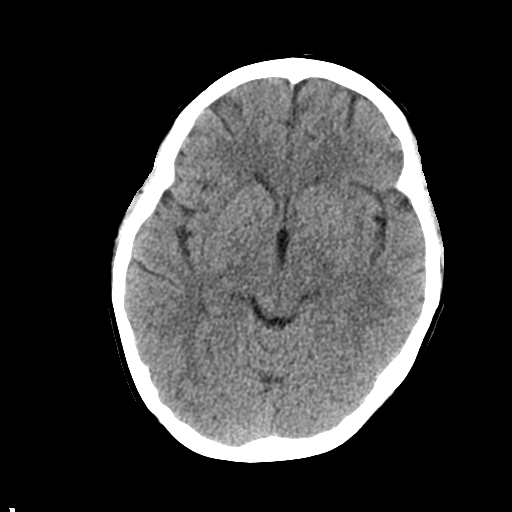
[im 15/34  bone]
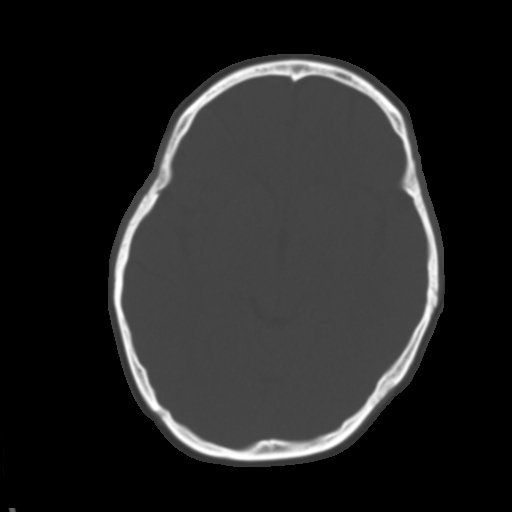
[im 19/34  brain]
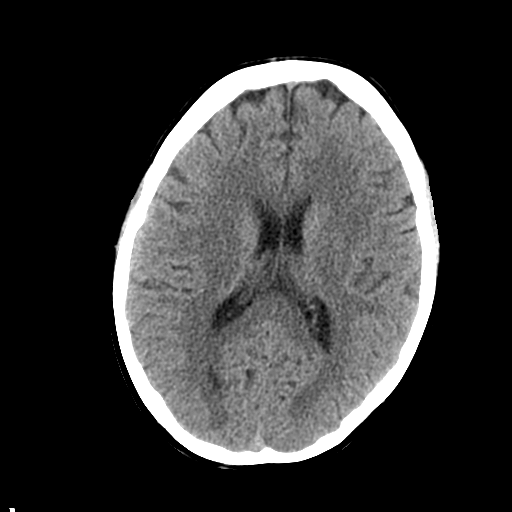
[im 22/34  brain]
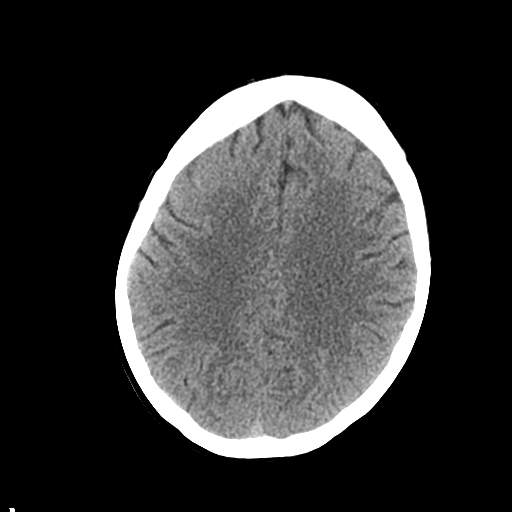
[im 26/34  brain]
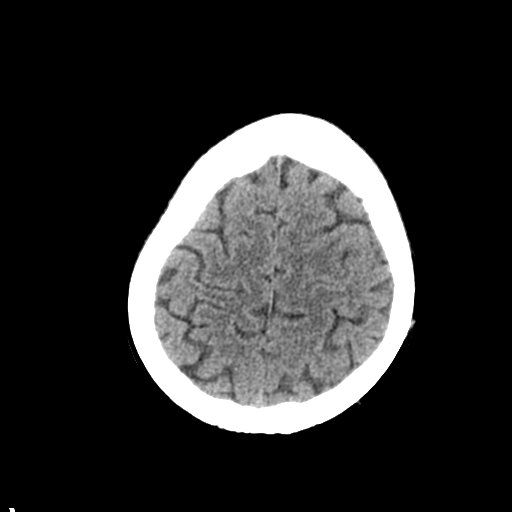
[im 28/34  brain]
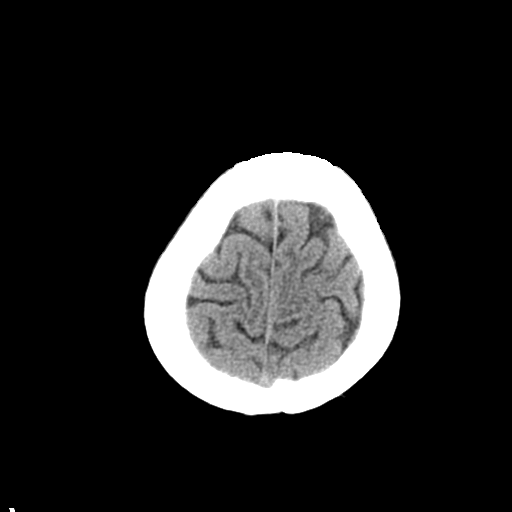
[im 28/34  bone]
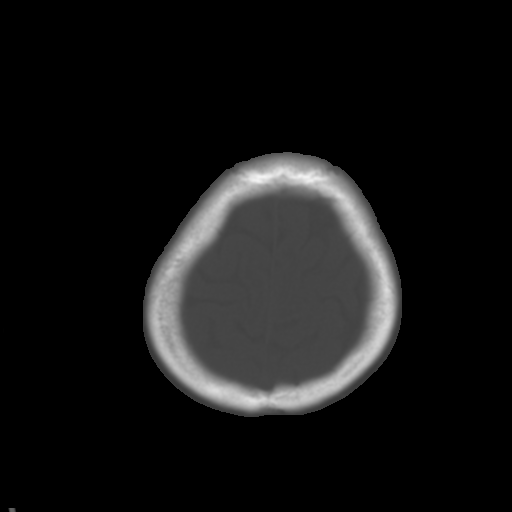
[im 31/34  brain]
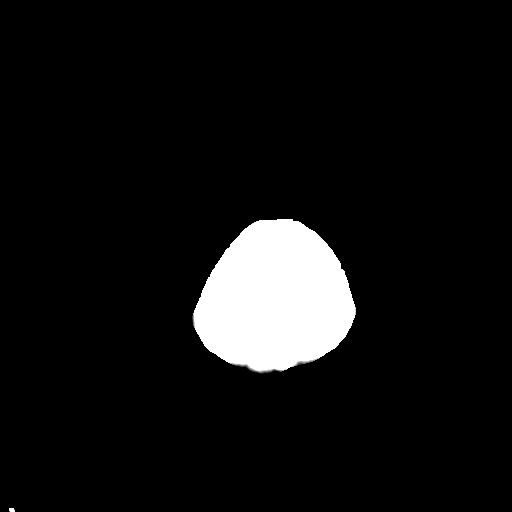

[Series 4: coronal soft tissue · coronal · 0.36mm/px · 3 of 66 slices shown]
[im 22/66  brain]
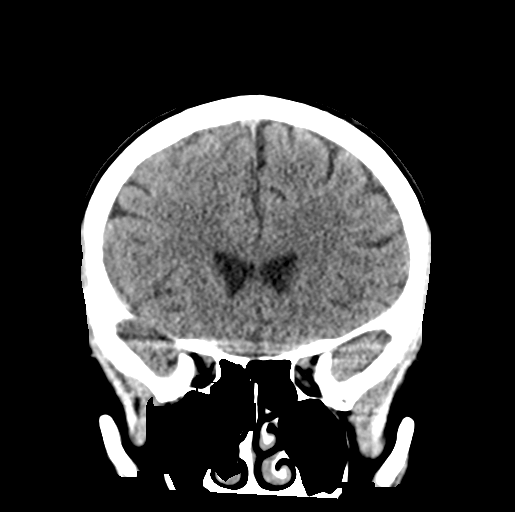
[im 29/66  brain]
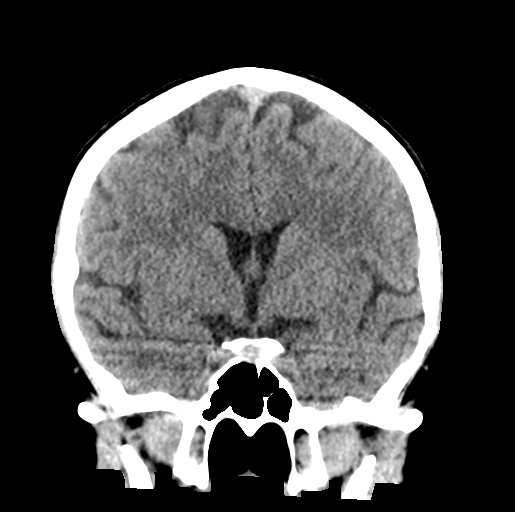
[im 37/66  brain]
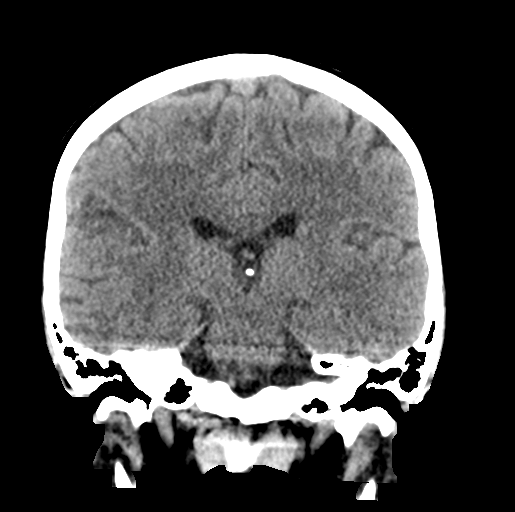

[Series 5: sagittal soft tissue · sagittal · 0.36mm/px · 3 of 61 slices shown]
[im 21/61  brain]
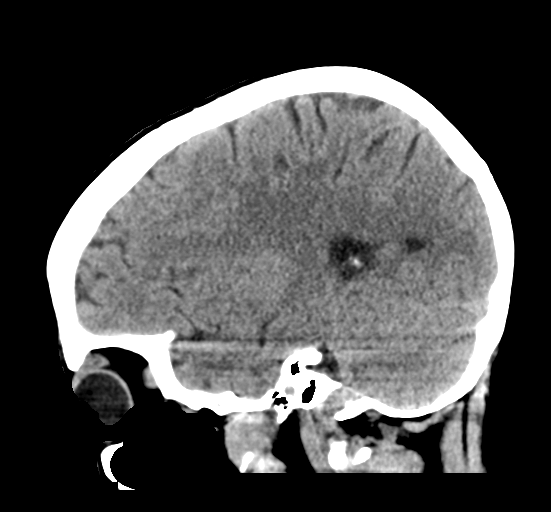
[im 31/61  brain]
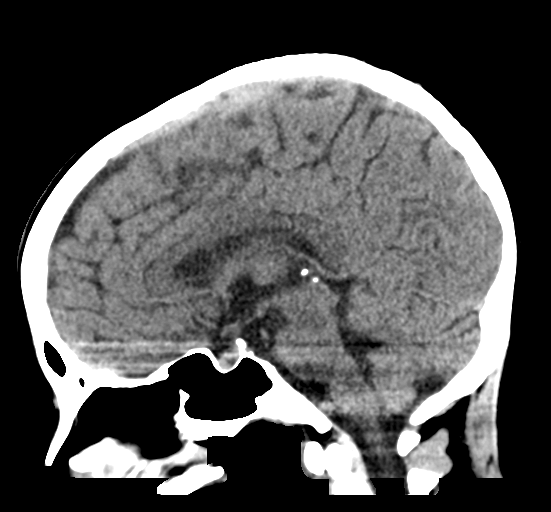
[im 41/61  brain]
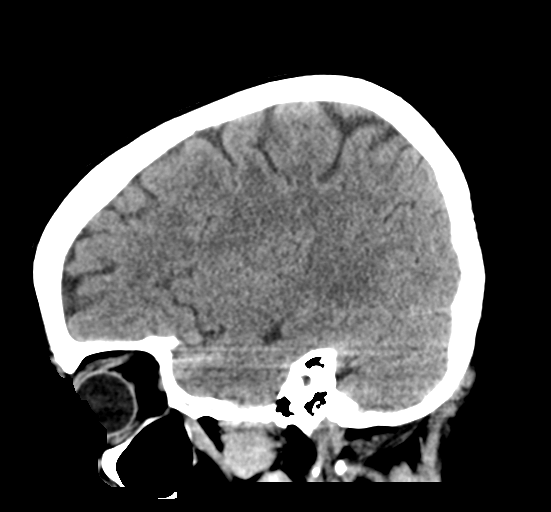

[16 of 47 positions shown; findings below may reference images not displayed]

FINDINGS: Brain: No acute infarct, hemorrhage, or mass lesion is present. No
significant white matter lesions are present. The ventricles are of
normal size. No significant extraaxial fluid collection is present.
The brainstem and cerebellum are within normal limits.

Vascular: No hyperdense vessel or unexpected calcification.

Skull: Calvarium is intact. No focal lytic or blastic lesions are
present. No significant extracranial soft tissue lesion is present.

Sinuses/Orbits: The paranasal sinuses and mastoid air cells are
clear. The globes and orbits are within normal limits.
IMPRESSION: Negative CT of the head.

## 2023-06-06 ENCOUNTER — Ambulatory Visit: Payer: MEDICAID | Attending: Internal Medicine | Admitting: Internal Medicine

## 2023-06-06 ENCOUNTER — Encounter: Payer: Self-pay | Admitting: Internal Medicine

## 2023-06-06 VITALS — BP 110/75 | HR 98 | Temp 97.9°F | Ht 61.0 in | Wt 149.0 lb

## 2023-06-06 DIAGNOSIS — B182 Chronic viral hepatitis C: Secondary | ICD-10-CM

## 2023-06-06 DIAGNOSIS — Z114 Encounter for screening for human immunodeficiency virus [HIV]: Secondary | ICD-10-CM | POA: Diagnosis not present

## 2023-06-06 DIAGNOSIS — F1911 Other psychoactive substance abuse, in remission: Secondary | ICD-10-CM | POA: Diagnosis not present

## 2023-06-06 DIAGNOSIS — R7989 Other specified abnormal findings of blood chemistry: Secondary | ICD-10-CM | POA: Diagnosis not present

## 2023-06-06 NOTE — Progress Notes (Signed)
Patient ID: Barbara Gordon, female    DOB: July 24, 1991  MRN: [id number]  CC: Hypothyroidism (Hypothyroidism f/u. Med refill - gabapentin, seroquil, zyprexa/Pt dx with hep c /Yes to pap smear for another appt)   Subjective: Barbara Gordon is a 32 y.o. female who presents for f/u.  Female Rudean Haskell is with her Her concerns today include:  Patient with history of polysubstance abuse, hepatitis C, substance-induced psychosis, schizophrenia  Patient presents with her female friend.  He tells me the main reason they are here is for her to get treatment for hepatitis C.  She was diagnosed in our system back in 2022.  However due to SUD, she has not sought treatment until now.  Gives history of IVDA in the past.   In regards to SUD, she reports being clean since 11/17/2022.  She has been at [facility] for 5 months.  She sees a Therapist, sports on Zoom out of [place].  Currently on Zyprexa, gabapentin and Seroquel through this psychiatrist.  She does not recall the doses.  She states that she has enough of her medication at this time.  She is currently unemployed.  Her friend states that they have both tried to find employment.  Reports being told in the past that her thyroid level was not good.  Never on medication.  Denies any palpitations, feeling of being hot or cold all the time.  No major weight changes. 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   Polysubstance abuse (HCC) 05/24/2021   Substance-induced disorder (HCC) 05/24/2021   Malingering 04/29/2021   Severe  cocaine use disorder (HCC) 04/28/2021   Amphetamine intoxication with perceptual disturbance (HCC) 04/27/2021   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   Elevated CPK 01/01/2021   Amphetamine use disorder, 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   Closed fracture of temporal bone (HCC) 01/09/2020   Epidural hematoma (HCC) 01/09/2020   Self-inflicted injury 01/09/2020   Cannabis use disorder, severe, dependence (HCC) 04/01/2018   Cigarette nicotine dependence without complication 04/01/2018   Moderate opioid use disorder (HCC) 04/01/2018   Moderate sedative, hypnotic, 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 (Patient not taking: Reported on 04/30/2022) 50 tablet 0   OLANZapine (ZYPREXA) 10 MG tablet take 1 tab by mouth at bedtime (Patient not taking: Reported on 07/01/2022) 30 tablet 0   propranolol (INDERAL) 10 MG tablet Take 1 tablet (10 mg total) by mouth 3 (three) times daily. (Patient not taking: Reported on 04/30/2022) 90 tablet 0   traZODone (DESYREL) 50 MG tablet Take 1 tablet (50 mg total) by mouth at bedtime as needed for sleep. (Patient not taking: Reported on 04/30/2022) 30 tablet 0   No current facility-administered medications on file prior to visit.    Allergies  Allergen Reactions   Latex Itching and Rash   Tramadol Itching and Other (See Comments)  Provider: Crist Fat CFM - Allergy Description: TraMADol HCl *ANALGESICS - OPIOID* CFM - Allergy Annotation: Pruritus.     Social History   Socioeconomic History   Marital status: Single    Spouse name: Not on file   Number of children: Not on file   Years of education: Not on file   Highest education level: Not on file  Occupational History   Not on file  Tobacco Use    Smoking status: Some Days    Types: Cigarettes   Smokeless tobacco: Never  Vaping Use   Vaping status: Never Used  Substance and Sexual Activity   Alcohol use: Not Currently   Drug use: Not Currently    Types: Cocaine, Amphetamines, Methamphetamines, Marijuana   Sexual activity: Never  Other Topics Concern   Not on file  Social History Narrative   Not on file   Social Determinants of Health   Financial Resource Strain: Low Risk  (11/20/2022)   Received from [facility]   Overall Financial Resource Strain (CARDIA)    Difficulty of Paying Living Expenses: Not very hard  Food Insecurity: Not on file  Transportation Needs: Not on file  Physical Activity: Not on file  Stress: No Stress Concern Present (11/20/2022)   Received from [facility] - Occupational Stress Questionnaire    Feeling of Stress : Only a little  Social Connections: Unknown (03/09/2022)   Received from [facility]   Social Network    Social Network: Not on file  Intimate Partner Violence: Unknown (02/10/2022)   Received from [facility]   HITS    Physically Hurt: Not on file    Insult or Talk Down To: Not on file    Threaten Physical Harm: Not on file    Scream or Curse: Not on file    No family history on file.  No past surgical history on file.  ROS: Review of Systems Negative except as stated above  PHYSICAL EXAM: BP 110/75 (BP Location: Left Arm, Patient Position: Sitting, Cuff Size: Normal)   Pulse 98   Temp 97.9 F (36.6 C) (Oral)   Ht 5\' 1"  (1.549 m)   Wt 149 lb (67.6 kg)   SpO2 99%   BMI 28.15 kg/m   Physical Exam  General appearance - alert, well appearing, and in no distress Mental status -patient is very forgetful.  She relies on her friend to help with the history.  She has a flat affect. Neck - supple, no significant adenopathy Chest - clear to auscultation, no wheezes, rales or rhonchi, symmetric air  entry Heart - normal rate, regular rhythm, normal S1, S2, no murmurs, rubs, clicks or gallops      Latest Ref Rng & Units 04/29/2022   12:17 PM 04/27/2022    1:07 PM 04/24/2022    7:03 PM  CMP  Glucose 70 - 99 mg/dL 295  98  95   BUN 6 - 20 mg/dL 14  13  10    Creatinine 0.44 - 1.00 mg/dL 2.84  1.32  4.40   Sodium 135 - 145 mmol/L 140  141  142   Potassium 3.5 - 5.1 mmol/L 4.0  4.3  3.8   Chloride 98 - 111 mmol/L 110  107  105   CO2 22 - 32 mmol/L 24  24  27    Calcium 8.9 - 10.3 mg/dL 9.9  10.2  72.5   Total Protein 6.5 - 8.1 g/dL 8.0  8.7  8.2   Total Bilirubin 0.3 - 1.2 mg/dL 0.8  0.8  0.9   Alkaline Phos 38 - 126 U/L 42  50  48   AST 15 - 41 U/L 27  31  25    ALT 0 - 44 U/L 35  43  34    Lipid Panel     Component Value Date/Time   CHOL 171 01/15/2022 1950   TRIG 10 01/15/2022 1950   HDL 36 (L) 01/15/2022 1950   CHOLHDL 4.8 01/15/2022 1950   VLDL 2 01/15/2022 1950   LDLCALC 133 (H) 01/15/2022 1950    CBC    Component Value Date/Time   WBC 8.3 04/29/2022 1217   RBC 4.35 04/29/2022 1217   HGB 14.0 04/29/2022 1217   HCT 39.0 04/29/2022 1217   PLT 299 04/29/2022 1217   MCV 89.7 04/29/2022 1217   MCH 32.2 04/29/2022 1217   MCHC 35.9 04/29/2022 1217   RDW 11.7 04/29/2022 1217   LYMPHSABS 2.1 04/29/2022 1217   MONOABS 0.4 04/29/2022 1217   EOSABS 0.3 04/29/2022 1217   BASOSABS 0.0 04/29/2022 1217    ASSESSMENT AND PLAN:  1. Chronic hepatitis C without hepatic coma (HCC) We will refer to ID for treatment. Encouraged her to remain free of street drugs. Advised hepatitis A/B vaccines.  Patient declined - Ambulatory referral to Infectious Disease  2. Substance abuse in remission [facility]) Commended her on quitting after going through a 83-month treatment program.  Encouraged her to remain free of street drugs.  She is not in AA/NA as support group.  She is not interested in that at this time.  3. Abnormal thyroid blood test - TSH+T4F+T3Free  4. Screening for HIV (human  immunodeficiency virus)  - HIV antibody (with reflex)    Patient was given the opportunity to ask questions.  Patient verbalized understanding of the plan and was able to repeat key elements of the plan.   This documentation was completed using Paediatric nurse.  Any transcriptional errors are unintentional.  Orders Placed This Encounter  Procedures   HIV antibody (with reflex)   TSH+T4F+T3Free   Ambulatory referral to Infectious Disease     Requested Prescriptions    No prescriptions requested or ordered in this encounter    Return in about 6 weeks (around 07/18/2023) for PAP.  Jonah Blue, MD, FACP

## 2023-06-14 ENCOUNTER — Telehealth: Payer: Self-pay

## 2023-06-14 ENCOUNTER — Other Ambulatory Visit (HOSPITAL_COMMUNITY): Payer: Self-pay

## 2023-06-14 NOTE — Telephone Encounter (Signed)
[facility] Patient Product/process development scientist completed.    The patient is insured through [employer] and has a $4.00 copay.  We will continue to follow to see if copay assistance is needed.  Clearance Coots, CPhT Specialty Pharmacy Patient [facility] for [facility] Phone: [phone] Fax:  [phone]

## 2023-06-16 ENCOUNTER — Ambulatory Visit: Payer: MEDICAID | Admitting: Family

## 2023-06-27 ENCOUNTER — Ambulatory Visit (INDEPENDENT_AMBULATORY_CARE_PROVIDER_SITE_OTHER): Payer: MEDICAID | Admitting: Infectious Diseases

## 2023-06-27 ENCOUNTER — Other Ambulatory Visit: Payer: Self-pay

## 2023-06-27 ENCOUNTER — Encounter: Payer: Self-pay | Admitting: Infectious Diseases

## 2023-06-27 VITALS — BP 124/75 | HR 87 | Temp 98.0°F | Resp 16 | Wt 147.0 lb

## 2023-06-27 DIAGNOSIS — Z3200 Encounter for pregnancy test, result unknown: Secondary | ICD-10-CM

## 2023-06-27 DIAGNOSIS — F1910 Other psychoactive substance abuse, uncomplicated: Secondary | ICD-10-CM | POA: Diagnosis not present

## 2023-06-27 DIAGNOSIS — B182 Chronic viral hepatitis C: Secondary | ICD-10-CM | POA: Diagnosis not present

## 2023-06-27 MED ORDER — MAVYRET 100-40 MG PO TABS: 3.0000 | ORAL_TABLET | Freq: Every day | ORAL | Status: DC

## 2023-06-27 NOTE — Progress Notes (Signed)
Patient Name: Barbara Gordon  Date of Birth: 1990/12/13  MRN: [id number]  PCP: Marcine Matar, MD  Referring Provider: Marcine Matar, MD, Ph#: [phone]   CC:  New patient - initial evaluation and management of chronic hepatitis C infection.  HPI/ROS:  Barbara Gordon is a 32 y.o. female is a 32 y.o. @GENDER @.  Patient tested positive with HCV RNA 278,000 with reactive antibody in March 2023. Transmission a/w SUD - previous history of injection heroin use; onset 8 years ago. Completed rehab and 7 months clean now. She feels like she has good resources to stay off drugs and is doing well.   Does not know much about hepatitis c.  Small chance she may be pregnant but not sure.   Review of Systems  Constitutional:  Negative for appetite change, fatigue, fever and unexpected weight change.  Respiratory:  Negative for shortness of breath.   Cardiovascular:  Negative for chest pain and leg swelling.  Gastrointestinal:  Negative for abdominal pain, blood in stool, nausea and vomiting.  Genitourinary:  Negative for difficulty urinating and hematuria.  Musculoskeletal:  Negative for arthralgias.  Skin:  Negative for color change.  Neurological:  Negative for dizziness, tremors and headaches.  Hematological:  Negative for adenopathy.  Psychiatric/Behavioral:  Negative for confusion.     All other systems reviewed and are negative      Past Medical History:  Diagnosis Date   Hypothyroidism    Polysubstance abuse (HCC)    Schizophrenia (HCC)     Prior to Admission medications   Medication Sig Start Date End Date Taking? Authorizing Provider  OLANZapine (ZYPREXA) 10 MG tablet take 1 tab by mouth at bedtime Patient not taking: Reported on 07/01/2022 04/22/22   Sarita Bottom, MD    Allergies  Allergen Reactions   Latex Itching and Rash   Tramadol Itching and Other (See Comments)    Provider: Crist Fat CFM - Allergy Description: TraMADol HCl *ANALGESICS - OPIOID*  CFM - Allergy Annotation: Pruritus.     Social History   Tobacco Use   Smoking status: Some Days    Types: Cigarettes, E-cigarettes   Smokeless tobacco: Never  Vaping Use   Vaping status: Never Used  Substance Use Topics   Alcohol use: Not Currently   Drug use: Not Currently    Types: Cocaine, Amphetamines, Methamphetamines, Marijuana    No family history on file.  Objective:   Vitals:   06/27/23 1503  BP: 124/75  Pulse: 87  Resp: 16  Temp: 98 F (36.7 C)  SpO2: 99%   Constitutional: in no apparent distress and oriented times 3 Eyes: anicteric Cardiovascular: Cor RRR Respiratory: clear Gastrointestinal: Bowel sounds are normal, liver is not enlarged, spleen is not enlarged Musculoskeletal: peripheral pulses normal, no pedal edema, no clubbing or cyanosis Skin: negative for - jaundice, spider hemangioma, telangiectasia, palmar erythema, ecchymosis and atrophy; no porphyria cutanea tarda Lymphatic: no cervical lymphadenopathy   Laboratory: Genotype: No results found for: "HCVGENOTYPE" HCV viral load: No results found for: "HCVQUANT" Lab Results  Component Value Date   WBC 8.3 04/29/2022   HGB 14.0 04/29/2022   HCT 39.0 04/29/2022   MCV 89.7 04/29/2022   PLT 299 04/29/2022    Lab Results  Component Value Date   CREATININE 0.66 04/29/2022   BUN 14 04/29/2022   NA 140 04/29/2022   K 4.0 04/29/2022   CL 110 04/29/2022   CO2 24 04/29/2022    Lab Results  Component Value Date   ALT  35 04/29/2022   AST 27 04/29/2022   ALKPHOS 42 04/29/2022    Lab Results  Component Value Date   INR 1.1 04/10/2022   BILITOT 0.8 04/29/2022   ALBUMIN 4.8 04/29/2022    Imaging:  none  Assessment & Plan:   Problem List Items Addressed This Visit       Unprioritized   Chronic hepatitis C without hepatic coma (HCC) - Primary    New Patient with Chronic Hepatitis C genotype unknown, treatment naive.  Fibrosis score unknown but FIB-4 from labs in January < 1.45  indicating low statistical risk for fibrosis.   I discussed with the patient the lab findings that confirm chronic hepatitis C as well as the natural history and progression of disease including about 30% of people who develop cirrhosis of the liver if left untreated and once cirrhosis is established there is a 2-7% risk per year of liver cancer and liver failure.  I discussed the importance of treatment and benefits in reducing the risk, even if significant liver fibrosis exists. I also discussed risk for re-infection following treatment should he not continue to modify risk factors.   Patient counseled avoidance of alcohol. Transmission discussed with patient including sexual transmission, sharing razors and toothbrush.  Will need referral to gastroenterology if concern for cirrhosis She is working on SUD treatment actively  Will prescribe appropriate medication based on genotype and coverage  Pregnancy test completed today as well. Had HIV screening test recently.  Hepatitis A and B titers to be drawn today with appropriate vaccinations as needed - she may consider them but is hesitant.  Prevnar20 vaccine at upcoming visit if concern for cirrhosis  Further work up to include liver staging through non-invasive serum analysis with APRI and FIB4 scores and Liver Fibrosis panel; U/S to follow if discordant or concerning results.  Will call Barbara Gordon back once all results are in and counsel on medication over the phone with our pharmacy team. She will return 4 weeks after starting to meet with pharmacy team and check RNA at that time.   Anticipate starting mavyret for short course pangenotypic treatment        Relevant Medications   Glecaprevir-Pibrentasvir (MAVYRET) 100-40 MG TABS   Other Relevant Orders   Hepatitis B surface antigen   Hepatitis B surface antibody,qualitative   Hepatitis A Ab, Total   Hepatitis C genotype   Hepatitis C RNA quantitative ([facility])   Liver Fibrosis,  FibroTest-ActiTest   CBC   COMPLETE METABOLIC PANEL WITH GFR   Polysubstance abuse (HCC) (Chronic)    In early remission at this point - congratulated her on work she has done to achieve this.       Other Visit Diagnoses     Encounter for pregnancy test, result unknown       Relevant Orders   B-HCG Quant         Rexene Alberts, MSN, NP-C [facility]  [email or web address] Pager: [phone] Office: [phone] [facility] Main Line: [phone]

## 2023-06-27 NOTE — Assessment & Plan Note (Signed)
In early remission at this point - congratulated her on work she has done to achieve this.

## 2023-06-27 NOTE — Assessment & Plan Note (Signed)
New Patient with Chronic Hepatitis C genotype unknown, treatment naive.  Fibrosis score unknown but FIB-4 from labs in January < 1.45 indicating low statistical risk for fibrosis.   I discussed with the patient the lab findings that confirm chronic hepatitis C as well as the natural history and progression of disease including about 30% of people who develop cirrhosis of the liver if left untreated and once cirrhosis is established there is a 2-7% risk per year of liver cancer and liver failure.  I discussed the importance of treatment and benefits in reducing the risk, even if significant liver fibrosis exists. I also discussed risk for re-infection following treatment should he not continue to modify risk factors.   Patient counseled avoidance of alcohol. Transmission discussed with patient including sexual transmission, sharing razors and toothbrush.  Will need referral to gastroenterology if concern for cirrhosis She is working on SUD treatment actively  Will prescribe appropriate medication based on genotype and coverage  Pregnancy test completed today as well. Had HIV screening test recently.  Hepatitis A and B titers to be drawn today with appropriate vaccinations as needed - she may consider them but is hesitant.  Prevnar20 vaccine at upcoming visit if concern for cirrhosis  Further work up to include liver staging through non-invasive serum analysis with APRI and FIB4 scores and Liver Fibrosis panel; U/S to follow if discordant or concerning results.  Will call Ziaire Hackmann back once all results are in and counsel on medication over the phone with our pharmacy team. She will return 4 weeks after starting to meet with pharmacy team and check RNA at that time.   Anticipate starting mavyret for short course pangenotypic treatment

## 2023-06-27 NOTE — Patient Instructions (Signed)
Nice to meet you today!    We need to get a little more information about your hepatitis c infection before we start your treatment. I anticipate that we can get you started in a few weeks after we submit approval to your insurance.     ABOUT HEPATITIS C VIRUS:  Chronic Hepatitis C is the most common blood-borne infection in the [place], affecting approximately 3 million people.  It is the leading cause of cirrhosis, liver cancer, and end stage liver disease requiring transplantation when this infection goes untreated for many years  The majority of people who are infected are unaware because there are not many early symptoms that are specific to this and often go undiagnosed until a specific blood test is drawn.   The hepatitis c virus is passed primarily through direct exposure of contaminated blood or body fluids. It is most efficiently transmitted through repeated exposure to infected blood.  Risk for sexual transmission is very low but is possible if there is high frequency of unprotected sexual activity with known hepatitis c partner or multiple partners of known status.  Over time, approximately 60-70% of people can develop some degree of liver disease. Cirrhosis occurs in 10-20% of those with chronic infection. 1-5% will get liver cancer, which has a very high rate of death.   Approximately 15-25% clear the infection without medication (usually in the first 6 months of becoming exposed to virus)  Newer medications provide over 95% cure rate when taken as prescribed    IN GENERAL ABOUT DIET  Persons living with chronic hepatitis c infection should eat a diet to maintain a healthy weight and avoid nutritional deficiencies.   Completely avoiding alcohol is the best decision for your liver health. If unable to do so please limit alcohol to as little as possible to less than 1 standard drink a day - this is very irritating to your liver.  Limit tylenol use to less than 2,000  mg daily (two extra strength tablets only twice a day)  If you have cirrhosis of the liver please take no more than 1,000 mg tylenol a day  Patients with cirrhosis should not have protein restriction; we recommend a protein intake of approximately 1.2-1.5 g/kg/day.   For patients with cirrhosis and hepatic encephalopathy, the American Association for the Study of Liver Diseases (AASLD) recommended protein intake is 1.2-1.5 g/kg/day.  If you experience ascites (fluid accumulation in the abdomen associated with severe liver damage / cirrhosis) please limit sodium intake to < 2000 mg a day    UNTIL YOU HAVE BEEN TREATED AND CURED:  Use condoms with all sexual encounters or practice abstinence to avoid sexual transmission   No sharing of razors, toothbrushes, nail clippers or anything that could potentially have blood on it.   If you cut yourself please clean and cover any wounds or open sores to others do not come into contact with your blood.   If blood spills onto item/surface please clean with 1:10 bleach solution and allow to dry, EVEN if it is dried blood.    GENERAL HELPFUL HINTS ON HCV THERAPY:  1. Stay well-hydrated.  2. Notify the ID Clinic of any changes in your other over-the-counter/herbal or prescription medications.  3. If you miss a dose of your medication, take the missed dose as soon as you remember. Return to your regular time/dose schedule the next day.   4.  Do not stop taking your medications without first talking with your healthcare provider.  5.  You will see our pharmacist-specialist within the first 2 weeks of starting your medication to monitor for any possible side effects.  6.  You will have blood work once during treatment 4 weeks after your first pill. Again soon after treatment is completed and one final lab 3 months after your last pill to ensure cure!   TIPS TO BE SUCCESSFUL WITH DAILY MEDICATION USE:  1. Set a reminder on your phone  2. Try  filling out a pill box for the week - pick a day and put one pill for every day during the week so you know right away if you missed a pill.   3. Have a trusted family member ask you about your medications.   4. Smartphone app    Medication we would like to use for you will be :   Mavyret Instructions:  Take Mavyret, three tablets (at the same time) daily with food. Please take ALL THREE PILLS AT ONCE. You should take it at approximately the same time every day. Treatment will be for 8 weeks. Do not miss a dose.    Do not run out of Mavyret! If you are down to one week of medication left and have not heard about your next shipment, please let us know as soon as possible. You will be given 28 days of treatment at a time and will receive one refill.   If you need to start a new medication, prescription from your doctor or over the counter medication, you need to contact us to make sure it does not interfere with Mavyret. There are several medications that can interfere with Mavyret and can make you sick or make the medication not work.  If you need to take a medication for acid reflux, you can take omeprazole 20mg  daily.     Tylenol (acetominophen) and Advil (ibuprofen) are safe to take with Harvoni if needed for headache, fever, pain.   IF YOU ARE ON BIRTH CONTROL PILLS, YOU RECEIVE A SHOT FOR BIRTH CONTROL, OR YOU HAVE AN IUD, please notify your provider to make sure it is safe with Mavyret.   DO NOT stop Mavyret unless instructed to by your provider. If you are hospitalized while taking this medication please bring it with you to the hospital to avoid interruption of therapy. Every pill is important!  The most common side effects associated with Mavyret include:  Fatigue Headache Nausea Diarrhea Insomnia

## 2023-07-05 LAB — HEPATITIS C RNA QUANTITATIVE
HCV Quantitative Log: 6.29 {Log_IU}/mL — ABNORMAL HIGH
HCV RNA, PCR, QN: 1940000 [IU]/mL — ABNORMAL HIGH

## 2023-07-05 LAB — LIVER FIBROSIS, FIBROTEST-ACTITEST
ALT: 37 U/L — ABNORMAL HIGH (ref 6–29)
Alpha-2-Macroglobulin: 219 mg/dL (ref 106–279)
Apolipoprotein A1: 122 mg/dL (ref 101–198)
Bilirubin: 0.5 mg/dL (ref 0.2–1.2)
Fibrosis Score: 0.25
GGT: 37 U/L (ref 3–50)
Haptoglobin: 60 mg/dL (ref 43–212)
Necroinflammat ACT Score: 0.19
Reference ID: 5073769

## 2023-07-05 LAB — COMPLETE METABOLIC PANEL WITH GFR
AG Ratio: 1.8 (calc) (ref 1.0–2.5)
ALT: 39 U/L — ABNORMAL HIGH (ref 6–29)
AST: 26 U/L (ref 10–30)
Albumin: 4.9 g/dL (ref 3.6–5.1)
Alkaline phosphatase (APISO): 64 U/L (ref 31–125)
BUN/Creatinine Ratio: 15 (calc) (ref 6–22)
BUN: 15 mg/dL (ref 7–25)
CO2: 23 mmol/L (ref 20–32)
Calcium: 9.6 mg/dL (ref 8.6–10.2)
Chloride: 106 mmol/L (ref 98–110)
Creat: 0.98 mg/dL — ABNORMAL HIGH (ref 0.50–0.97)
Globulin: 2.8 g/dL (ref 1.9–3.7)
Glucose, Bld: 120 mg/dL — ABNORMAL HIGH (ref 65–99)
Potassium: 3.8 mmol/L (ref 3.5–5.3)
Sodium: 138 mmol/L (ref 135–146)
Total Bilirubin: 0.5 mg/dL (ref 0.2–1.2)
Total Protein: 7.7 g/dL (ref 6.1–8.1)
eGFR: 79 mL/min/{1.73_m2} (ref >=60)

## 2023-07-05 LAB — HEPATITIS A ANTIBODY, TOTAL: Hepatitis A AB,Total: REACTIVE — AB

## 2023-07-05 LAB — HCG, QUANTITATIVE, PREGNANCY: HCG, Total, QN: <5 m[IU]/mL

## 2023-07-05 LAB — CBC
HCT: 41.4 % (ref 35.0–45.0)
Hemoglobin: 14.5 g/dL (ref 11.7–15.5)
MCH: 32 pg (ref 27.0–33.0)
MCHC: 35 g/dL (ref 32.0–36.0)
MCV: 91.4 fL (ref 80.0–100.0)
MPV: 10.1 fL (ref 7.5–12.5)
Platelets: 296 10*3/uL (ref 140–400)
RBC: 4.53 10*6/uL (ref 3.80–5.10)
RDW: 12 % (ref 11.0–15.0)
WBC: 8 10*3/uL (ref 3.8–10.8)

## 2023-07-05 LAB — HEPATITIS B SURFACE ANTIGEN: Hepatitis B Surface Ag: NONREACTIVE

## 2023-07-05 LAB — HEPATITIS B SURFACE ANTIBODY,QUALITATIVE: Hep B S Ab: NONREACTIVE

## 2023-07-05 LAB — HEPATITIS C GENOTYPE: HCV Genotype: 2

## 2023-07-08 ENCOUNTER — Other Ambulatory Visit: Payer: Self-pay | Admitting: Infectious Diseases

## 2023-07-08 ENCOUNTER — Other Ambulatory Visit (HOSPITAL_COMMUNITY): Payer: Self-pay

## 2023-07-08 ENCOUNTER — Other Ambulatory Visit: Payer: Self-pay

## 2023-07-08 DIAGNOSIS — B182 Chronic viral hepatitis C: Secondary | ICD-10-CM

## 2023-07-08 MED ORDER — MAVYRET 100-40 MG PO TABS
3.0000 | ORAL_TABLET | Freq: Every day | ORAL | 1 refills | Status: DC
Start: 2023-07-08 — End: 2023-09-29
  Filled 2023-07-08 – 2023-07-12 (×2): qty 84, 28d supply, fill #0
  Filled 2023-08-10: qty 84, 28d supply, fill #1

## 2023-07-12 ENCOUNTER — Other Ambulatory Visit: Payer: Self-pay

## 2023-07-12 ENCOUNTER — Other Ambulatory Visit (HOSPITAL_COMMUNITY): Payer: Self-pay

## 2023-07-13 ENCOUNTER — Other Ambulatory Visit: Payer: Self-pay

## 2023-07-14 ENCOUNTER — Telehealth: Payer: Self-pay

## 2023-07-14 NOTE — Telephone Encounter (Signed)
[facility] Patient Advocate Encounter  Patient's medications have been couriered to [facility] from [facility] pharmacy and was picked up  on 07/14/23.  Mavyret 1st box.  Clearance Coots , CPhT Specialty Pharmacy Patient [facility] for Infectious Disease Phone: [phone] Fax:  [phone]

## 2023-07-15 ENCOUNTER — Telehealth: Payer: Self-pay | Admitting: Pharmacist

## 2023-07-15 NOTE — Telephone Encounter (Signed)
Patient is approved to receive Mavyret x 8 weeks for chronic Hepatitis C infection. Counseled patient to take all three tablets of Mavyret daily with food.  Counseled patient the need to take all three tablets together and to not separate them out during the day. Encouraged patient not to miss any doses and explained how their chance of cure could go down with each dose missed. Counseled patient on what to do if dose is missed - if it is closer to the missed dose take immediately; if closer to next dose then skip dose and take the next dose at the usual time. Counseled patient on common side effects such as headache, fatigue, and nausea and that these normally decrease with time. I reviewed patient medications and found no drug interactions. Discussed with patient that there are several drug interactions with Mavyret and instructed patient to call the clinic if she wishes to start a new medication during course of therapy. Also advised patient to call if she experiences any side effects. Patient will follow-up with Cassie in one month.  Margarite Gouge, PharmD, CPP, BCIDP, AAHIVP Clinical Pharmacist Practitioner Infectious Diseases Clinical Pharmacist [facility] for Infectious Disease

## 2023-07-18 ENCOUNTER — Ambulatory Visit: Payer: MEDICAID | Admitting: Internal Medicine

## 2023-07-28 ENCOUNTER — Other Ambulatory Visit (HOSPITAL_COMMUNITY): Payer: Self-pay

## 2023-07-29 ENCOUNTER — Other Ambulatory Visit (HOSPITAL_COMMUNITY): Payer: Self-pay

## 2023-08-02 ENCOUNTER — Other Ambulatory Visit: Payer: Self-pay | Admitting: Pharmacist

## 2023-08-02 NOTE — Progress Notes (Signed)
Specialty Pharmacy Initiation Note   Barbara Gordon is a 32 y.o. female who will be followed by the specialty pharmacy service for RxSp Hepatitis C    Review of administration, indication, effectiveness, safety, potential side effects, storage/disposable, and missed dose instructions occurred today for patient's specialty medication(s) Glecaprevir-Pibrentasvir .    Patient did not have any additional questions or concerns.   Patient's therapy is appropriate to : Initiate    Goals      Achieve virologic cure as evidenced by SVR     Patient is initiating therapy. Patient will be evaluated at upcoming provider appointment to assess progress      Maintain optimal adherence to therapy     Patient is initiating therapy. Patient will be evaluated at upcoming provider appointment to assess progress

## 2023-08-10 ENCOUNTER — Other Ambulatory Visit: Payer: Self-pay

## 2023-08-10 ENCOUNTER — Other Ambulatory Visit (HOSPITAL_COMMUNITY): Payer: Self-pay

## 2023-08-10 NOTE — Progress Notes (Signed)
Specialty Pharmacy Refill Coordination Note  Barbara Gordon is a 32 y.o. female contacted today regarding refills of specialty medication(s) Glecaprevir-Pibrentasvir   Patient requested Courier to Provider Office   Delivery date: 08/12/23   Verified address: [facility] [address] [place] [address]   Medication will be filled on 08/11/23.

## 2023-08-10 NOTE — Progress Notes (Deleted)
NEW REFERRAL TO CPP CLINIC      HPI: Barbara Gordon is a 32 y.o. female who presents to the [facility] pharmacy clinic for Hepatitis C follow-up.  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   Polysubstance abuse (HCC) 05/24/2021   Substance-induced disorder (HCC) 05/24/2021   Malingering 04/29/2021   Severe cocaine use disorder (HCC) 04/28/2021   Amphetamine intoxication with perceptual disturbance (HCC) 04/27/2021   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   Elevated CPK 01/01/2021   Amphetamine use disorder, 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   GLECAPREVIR-PIBRENTASVIR (MAVYRET) 100-40 MG TABS    Take 3 tablets by mouth daily after supper.   IBUPROFEN (ADVIL) 600 MG TABLET    Take 600 mg by mouth every 6 (six) hours as needed.   OLANZAPINE (ZYPREXA) 10 MG TABLET    take 1 tab by mouth  at bedtime   QUETIAPINE (SEROQUEL) 50 MG TABLET    Take by mouth.  Modified Medications   No medications on file  Discontinued Medications   No medications on file    Labs: Hepatitis C Lab Results  Component Value Date   HCVGENOTYPE 2 06/27/2023   HCVRNAPCRQN 1,940,000 (H) 06/27/2023   FIBROSTAGE F0-F1 06/27/2023   Hepatitis B Lab Results  Component Value Date   HEPBSAB NON-REACTIVE 06/27/2023   HEPBSAG NON-REACTIVE 06/27/2023   Hepatitis A Lab Results  Component Value Date   HAV REACTIVE (A) 06/27/2023   HIV Lab Results  Component Value Date   HIV Non Reactive 06/06/2023   HIV Non Reactive 01/19/2022   HIV Non Reactive 10/08/2021   Lab Results  Component Value Date   CREATININE 0.98 (H) 06/27/2023   CREATININE 0.66 04/29/2022   CREATININE 0.62 04/27/2022   CREATININE 0.74 04/24/2022   CREATININE 0.53 04/12/2022   Lab Results  Component Value Date   AST 26 06/27/2023   AST 27 04/29/2022   AST 31 04/27/2022   ALT 37 (H) 06/27/2023   ALT 39 (H) 06/27/2023   ALT 35 04/29/2022   INR 1.1 04/10/2022    Assessment: Barbara Gordon is here today to follow up for her Hepatitis C infection. She has been taking Mavyret for ~4 weeks and took her last pill of her 1st month last night. Gave her the 2nd and final box today to complete treatment. She is taking all three tablets together with food in the ***. She has not had any side effects or missed any  doses. Congratulated her on this and encouraged continued adherence for the next month. ALT was just a tad elevated so will recheck a CMP today in addition to a HCV RNA to assess treatment response so far. Will have her see Barbara Gordon when she completes treatment in ~1 month.    Plan: - Continue Mavyret x 8 weeks - HCV RNA + CMP today - Follow up with Barbara Gordon on ***  Barbara Gordon L. Orren Pietsch, PharmD, BCIDP, AAHIVP, CPP Clinical Pharmacist Practitioner Infectious Diseases Clinical Pharmacist [facility]  D[facility] 08/10/2023, 4:22 PM

## 2023-08-11 ENCOUNTER — Other Ambulatory Visit: Payer: Self-pay

## 2023-08-11 ENCOUNTER — Ambulatory Visit: Payer: MEDICAID | Admitting: Pharmacist

## 2023-08-11 DIAGNOSIS — B182 Chronic viral hepatitis C: Secondary | ICD-10-CM

## 2023-08-12 ENCOUNTER — Telehealth: Payer: Self-pay | Admitting: Pharmacist

## 2023-08-12 ENCOUNTER — Other Ambulatory Visit: Payer: Self-pay

## 2023-08-12 ENCOUNTER — Telehealth: Payer: Self-pay

## 2023-08-12 DIAGNOSIS — R110 Nausea: Secondary | ICD-10-CM

## 2023-08-12 MED ORDER — PROMETHAZINE HCL 12.5 MG PO TABS
12.5000 mg | ORAL_TABLET | Freq: Four times a day (QID) | ORAL | 0 refills | Status: DC | PRN
Start: 2023-08-12 — End: 2023-10-29
  Filled 2023-08-12: qty 30, 8d supply, fill #0

## 2023-08-12 NOTE — Telephone Encounter (Signed)
Patient called this morning stating she has been experiencing nausea x 1 week since being on Mavyret for ~3 weeks. Reviewed that this is a common side effect particularly in the first month on medication. Confirms she is taking Mavyret with a full meal once daily. States Phenergan has been effective for her in the past. Sending phenergan 12.5 mg PRN script to [facility] pharmacy in our building. Reviewed to take it 30 minutes to 1 hour before Mavyret to prevent nausea and then use as needed thereafter.   Margarite Gouge, PharmD, CPP, BCIDP, AAHIVP Clinical Pharmacist Practitioner Infectious Diseases Clinical Pharmacist [facility] for Infectious Disease

## 2023-08-12 NOTE — Telephone Encounter (Signed)
[facility] Patient Advocate Encounter  Patient's medications have been couriered to [facility] from [facility] pharmacy and will be picked up 08/11/23.  Mavyret 2nd box  Clearance Coots , CPhT Specialty Pharmacy Patient [facility] for Infectious Disease Phone: [phone] Fax:  [phone]

## 2023-08-15 ENCOUNTER — Ambulatory Visit: Payer: MEDICAID | Admitting: Pharmacist

## 2023-08-15 NOTE — Progress Notes (Unsigned)
HPI: Barbara Gordon is a 32 y.o. female who presents to the [facility] pharmacy clinic for Hepatitis C follow-up.  Medication: Mavyret  Start Date: ***  Hepatitis C Genotype: 2  Fibrosis Score: Fibrosis score unknown but FIB-4 from labs in January < 1.45 indicating low statistical risk for fibrosis.   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   Polysubstance abuse (HCC) 05/24/2021   Substance-induced disorder (HCC) 05/24/2021   Malingering 04/29/2021   Severe cocaine use disorder (HCC) 04/28/2021   Amphetamine intoxication with perceptual disturbance (HCC) 04/27/2021   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   Elevated CPK 01/01/2021   Amphetamine use disorder, 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   GLECAPREVIR-PIBRENTASVIR (MAVYRET) 100-40 MG TABS    Take 3 tablets by mouth daily after supper.   IBUPROFEN (ADVIL) 600 MG TABLET    Take 600 mg by mouth every 6 (six)  hours as needed.   OLANZAPINE (ZYPREXA) 10 MG TABLET    take 1 tab by mouth at bedtime   PROMETHAZINE (PHENERGAN) 12.5 MG TABLET    Take 1 tablet (12.5 mg total) by mouth every 6 (six) hours as needed for nausea or vomiting. Take 1 tablet 30 minutes to 1 hour before taking Mavyret.   QUETIAPINE (SEROQUEL) 50 MG TABLET    Take by mouth.  Modified Medications   No medications on file  Discontinued Medications   No medications on file    Labs: Hepatitis C Lab Results  Component Value Date   HCVGENOTYPE 2 06/27/2023   HCVRNAPCRQN 1,940,000 (H) 06/27/2023   FIBROSTAGE F0-F1 06/27/2023   Hepatitis B Lab Results  Component Value Date   HEPBSAB NON-REACTIVE 06/27/2023   HEPBSAG NON-REACTIVE 06/27/2023   Hepatitis A Lab Results  Component Value Date   HAV REACTIVE (A) 06/27/2023   HIV Lab Results  Component Value Date   HIV Non Reactive 06/06/2023   HIV Non Reactive 01/19/2022   HIV Non Reactive 10/08/2021   Lab Results  Component Value Date   CREATININE 0.98 (H) 06/27/2023   CREATININE 0.66 04/29/2022   CREATININE 0.62 04/27/2022   CREATININE 0.74 04/24/2022   CREATININE 0.53 04/12/2022   Lab Results  Component Value Date   AST 26 06/27/2023   AST 27 04/29/2022   AST 31 04/27/2022   ALT 37 (H) 06/27/2023   ALT 39 (H) 06/27/2023   ALT 35 04/29/2022   INR 1.1 04/10/2022    Assessment: Raynetta is here today to follow up for her Hepatitis C infection. She has been taking Mavyret  for ~4 weeks and took her last pill of her 1st month ***. Gave her the 2nd and final box today to complete treatment. She is taking all three tablets together with food in the ***. She has not had any side effects or missed any doses. Congratulated her on this and encouraged continued adherence for the next month. ALT mildly elevated on initiation, will recheck CMP and HCV RNA to assess treatment response so far. Will schedule follow up with Judeth Cornfield 1 month post treatment.  Follow up Hep A/B  vaccines, Prevnar 20 vaccine   Plan: - Continue Mavyret x 8 weeks - HCV RNA + CMP today - Follow up with Judeth Cornfield on ***  Lora Paula, PharmD PGY-2 Infectious Diseases Pharmacy Resident 08/15/2023 7:54 PM

## 2023-08-16 ENCOUNTER — Ambulatory Visit: Payer: MEDICAID | Admitting: Pharmacist

## 2023-08-16 ENCOUNTER — Other Ambulatory Visit: Payer: Self-pay

## 2023-08-16 ENCOUNTER — Emergency Department (HOSPITAL_BASED_OUTPATIENT_CLINIC_OR_DEPARTMENT_OTHER)
Admission: EM | Admit: 2023-08-16 | Discharge: 2023-08-16 | Discharge status: Against Medical Advice | Payer: MEDICAID | Attending: Emergency Medicine | Admitting: Emergency Medicine

## 2023-08-16 ENCOUNTER — Encounter (HOSPITAL_BASED_OUTPATIENT_CLINIC_OR_DEPARTMENT_OTHER): Payer: Self-pay | Admitting: Urology

## 2023-08-16 DIAGNOSIS — R110 Nausea: Secondary | ICD-10-CM | POA: Insufficient documentation

## 2023-08-16 DIAGNOSIS — R630 Anorexia: Secondary | ICD-10-CM | POA: Diagnosis not present

## 2023-08-16 DIAGNOSIS — Z5321 Procedure and treatment not carried out due to patient leaving prior to being seen by health care provider: Secondary | ICD-10-CM | POA: Diagnosis not present

## 2023-08-16 DIAGNOSIS — R6883 Chills (without fever): Secondary | ICD-10-CM | POA: Insufficient documentation

## 2023-08-16 DIAGNOSIS — R059 Cough, unspecified: Secondary | ICD-10-CM | POA: Insufficient documentation

## 2023-08-16 DIAGNOSIS — R0981 Nasal congestion: Secondary | ICD-10-CM | POA: Diagnosis not present

## 2023-08-16 DIAGNOSIS — R519 Headache, unspecified: Secondary | ICD-10-CM | POA: Insufficient documentation

## 2023-08-16 NOTE — ED Triage Notes (Signed)
Pt states chills, nausea, headache, cough, congestion for 9 days  Decreased appetite

## 2023-08-24 ENCOUNTER — Telehealth: Payer: Self-pay | Admitting: Pharmacist

## 2023-08-24 NOTE — Telephone Encounter (Signed)
Patient came to clinic today stating she lost her second Mavyret box. She is scheduled to see me tomorrow for follow up since she no showed Cassie last week. Will check HCV RNA to see where she is currently at after ~4 weeks of treatment. Judeth Cornfield, would you prefer to follow her HCV RNA off of treatment? I do not imagine we would be able to acquire more medication for her. Will discuss with Lupita Leash and Toni Amend tomorrow.  Margarite Gouge, PharmD, CPP, BCIDP, AAHIVP Clinical Pharmacist Practitioner Infectious Diseases Clinical Pharmacist [facility] for Infectious Disease

## 2023-08-24 NOTE — Progress Notes (Unsigned)
HPI: Barbara Gordon is a 32 y.o. female who presents to the [facility] pharmacy clinic for Hepatitis C follow-up.  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   Polysubstance abuse (HCC) 05/24/2021   Substance-induced disorder (HCC) 05/24/2021   Malingering 04/29/2021   Severe cocaine use disorder (HCC) 04/28/2021   Amphetamine intoxication with perceptual disturbance (HCC) 04/27/2021   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   Elevated CPK 01/01/2021   Amphetamine use disorder, 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   GLECAPREVIR-PIBRENTASVIR (MAVYRET) 100-40 MG TABS    Take 3 tablets by mouth daily after supper.   IBUPROFEN (ADVIL) 600 MG TABLET    Take 600 mg by mouth every 6 (six) hours as needed.   OLANZAPINE (ZYPREXA) 10 MG TABLET    take 1 tab by mouth at  bedtime   PROMETHAZINE (PHENERGAN) 12.5 MG TABLET    Take 1 tablet (12.5 mg total) by mouth every 6 (six) hours as needed for nausea or vomiting. Take 1 tablet 30 minutes to 1 hour before taking Mavyret.   QUETIAPINE (SEROQUEL) 50 MG TABLET    Take by mouth.  Modified Medications   No medications on file  Discontinued Medications   No medications on file    Allergies: Allergies  Allergen Reactions   Latex Itching and Rash   Tramadol Itching and Other (See Comments)    Provider: Crist Fat CFM - Allergy Description: TraMADol HCl *ANALGESICS - OPIOID* CFM - Allergy Annotation: Pruritus.     Past Medical History: Past Medical History:  Diagnosis Date   Hypothyroidism    Polysubstance abuse (HCC)    Schizophrenia (HCC)     Social History: Social History   Socioeconomic History   Marital status: Single    Spouse name: Not on file   Number of children: Not on file   Years of education: Not on file   Highest education level: Not on file  Occupational History   Not on file  Tobacco Use   Smoking status: Some Days    Types: Cigarettes, E-cigarettes   Smokeless tobacco: Never  Vaping Use   Vaping status: Never Used  Substance and Sexual Activity   Alcohol use: Not Currently   Drug use: Not Currently    Types: Cocaine, Amphetamines, Methamphetamines, Marijuana   Sexual activity: Never  Other Topics Concern   Not on file  Social History Narrative   Not on file  Social Determinants of Health   Financial Resource Strain: Low Risk  (11/20/2022)   Received from [facility]   Overall Financial Resource Strain (CARDIA)    Difficulty of Paying Living Expenses: Not very hard  Food Insecurity: Not on file  Transportation Needs: Not on file  Physical Activity: Not on file  Stress: No Stress Concern Present (11/20/2022)   Received from [facility] - Occupational Stress Questionnaire    Feeling of Stress : Only a little   Social Connections: Unknown (03/09/2022)   Received from [facility]   Social Network    Social Network: Not on file    Labs: Hepatitis C Lab Results  Component Value Date   HCVGENOTYPE 2 06/27/2023   HCVRNAPCRQN 1,940,000 (H) 06/27/2023   FIBROSTAGE F0-F1 06/27/2023   Hepatitis B Lab Results  Component Value Date   HEPBSAB NON-REACTIVE 06/27/2023   HEPBSAG NON-REACTIVE 06/27/2023   Hepatitis A Lab Results  Component Value Date   HAV REACTIVE (A) 06/27/2023   HIV Lab Results  Component Value Date   HIV Non Reactive 06/06/2023   HIV Non Reactive 01/19/2022   HIV Non Reactive 10/08/2021   Lab Results  Component Value Date   CREATININE 0.98 (H) 06/27/2023   CREATININE 0.66 04/29/2022   CREATININE 0.62 04/27/2022   CREATININE 0.74 04/24/2022   CREATININE 0.53 04/12/2022   Lab Results  Component Value Date   AST 26 06/27/2023   AST 27 04/29/2022   AST 31 04/27/2022   ALT 37 (H) 06/27/2023   ALT 39 (H) 06/27/2023   ALT 35 04/29/2022   INR 1.1 04/10/2022    Assessment: Barbara Gordon presents to clinic today with her partner for HCV follow-up; she has been taking Mavyret for ~5 weeks. Has been experiencing nausea and states Phenergan has helped with relief. Stated over the phone yesterday that she lost her second box of Mavyret. When prompted today, she admits the box is at her friend's house in [place]. During the appointment today, she spoke with this friend and is planning to meet them today to pick up her Mavyret box. She has only missed 2 days of therapy because of this. Will check HCV RNA today and follow-up in 4 weeks for an end of treatment visit. Given she has missed less than 7 days of therapy, will continue with normal treatment schedule. Will also check CMP as her LFTs were slightly increased in August.  Due for hepatitis B, flu, and COVID vaccines which she declined today.    Plan: - Check HCV RNA and LFTs - Continue Mavyret  - Follow up with  me on 11/21  Margarite Gouge, PharmD, CPP, BCIDP, AAHIVP Clinical Pharmacist Practitioner Infectious Diseases Clinical Pharmacist [facility] 08/24/2023, 4:30 PM

## 2023-08-25 ENCOUNTER — Other Ambulatory Visit: Payer: Self-pay

## 2023-08-25 ENCOUNTER — Other Ambulatory Visit (HOSPITAL_COMMUNITY): Payer: Self-pay

## 2023-08-25 ENCOUNTER — Ambulatory Visit (INDEPENDENT_AMBULATORY_CARE_PROVIDER_SITE_OTHER): Payer: MEDICAID | Admitting: Pharmacist

## 2023-08-25 DIAGNOSIS — B182 Chronic viral hepatitis C: Secondary | ICD-10-CM | POA: Diagnosis not present

## 2023-08-27 LAB — HEPATITIS C RNA QUANTITATIVE
HCV Quantitative Log: <1.18 {Log}
HCV RNA, PCR, QN: <15 [IU]/mL

## 2023-08-27 LAB — COMPREHENSIVE METABOLIC PANEL
AG Ratio: 1.2 (calc) (ref 1.0–2.5)
ALT: 21 U/L (ref 6–29)
AST: 19 U/L (ref 10–30)
Albumin: 3.9 g/dL (ref 3.6–5.1)
Alkaline phosphatase (APISO): 60 U/L (ref 31–125)
BUN: 7 mg/dL (ref 7–25)
CO2: 29 mmol/L (ref 20–32)
Calcium: 9.9 mg/dL (ref 8.6–10.2)
Chloride: 102 mmol/L (ref 98–110)
Creat: 0.83 mg/dL (ref 0.50–0.97)
Globulin: 3.3 g/dL (ref 1.9–3.7)
Glucose, Bld: 106 mg/dL — ABNORMAL HIGH (ref 65–99)
Potassium: 4.1 mmol/L (ref 3.5–5.3)
Sodium: 139 mmol/L (ref 135–146)
Total Bilirubin: 0.6 mg/dL (ref 0.2–1.2)
Total Protein: 7.2 g/dL (ref 6.1–8.1)

## 2023-08-30 ENCOUNTER — Other Ambulatory Visit: Payer: Self-pay

## 2023-08-30 ENCOUNTER — Ambulatory Visit: Payer: MEDICAID | Admitting: Pharmacist

## 2023-09-13 ENCOUNTER — Other Ambulatory Visit: Payer: Self-pay | Admitting: Internal Medicine

## 2023-09-13 NOTE — Telephone Encounter (Signed)
Medication Refill - Medication: OLANZapine (ZYPREXA) 10 MG tablet, gabapentin (NEURONTIN) 300 MG capsule and QUEtiapine (SEROQUEL) 50 MG tablet   Has the patient contacted their pharmacy? No.  Preferred Pharmacy (with phone number or street name):  [id number] - [place], [place] - [address] Phone: [phone]  Fax: [phone]     Has the patient been seen for an appointment in the last year OR does the patient have an upcoming appointment? Yes.    Agent: Please be advised that RX refills may take up to 3 business days. We ask that you follow-up with your pharmacy.

## 2023-09-14 NOTE — Telephone Encounter (Signed)
Requested medication (s) are due for refill today:   Provider to review  Requested medication (s) are on the active medication list:   All 3 are historical   Future visit scheduled:   No   LOV 06/06/2023.  07/18/2023 was a No Show for Dr. Laural Benes   Last ordered: All historical medications.      Requested Prescriptions  Pending Prescriptions Disp Refills   OLANZapine (ZYPREXA) 10 MG tablet 30 tablet 0    Sig: take 1 tab by mouth at bedtime     Not Delegated - Psychiatry:  Antipsychotics - Second Generation (Atypical) - olanzapine Failed - 09/13/2023  4:22 PM      Failed - This refill cannot be delegated      Failed - Lipid Panel in normal range within the last 12 months    Cholesterol  Date Value Ref Range Status  01/15/2022 171 0 - 200 mg/dL Final   LDL Cholesterol  Date Value Ref Range Status  01/15/2022 133 (H) 0 - 99 mg/dL Final    Comment:           Total Cholesterol/HDL:CHD Risk Coronary Heart Disease Risk Table                     Men   Women  1/2 Average Risk   3.4   3.3  Average Risk       5.0   4.4  2 X Average Risk   9.6   7.1  3 X Average Risk  23.4   11.0        Use the calculated Patient Ratio above and the CHD Risk Table to determine the patient's CHD Risk.        ATP III CLASSIFICATION (LDL):  <100     mg/dL   Optimal  086-578  mg/dL   Near or Above                    Optimal  130-159  mg/dL   Borderline  469-629  mg/dL   High  >528     mg/dL   Very High Performed at [facility], [address], [place], [place] [address]    HDL  Date Value Ref Range Status  01/15/2022 36 (L) >40 mg/dL Final   Triglycerides  Date Value Ref Range Status  01/15/2022 10 <150 mg/dL Final         Failed - CBC within normal limits and completed in the last 12 months    WBC  Date Value Ref Range Status  06/27/2023 8.0 3.8 - 10.8 Thousand/uL Final   RBC  Date Value Ref Range Status  06/27/2023 4.53 3.80 - 5.10 Million/uL Final   Hemoglobin  Date Value  Ref Range Status  06/27/2023 14.5 11.7 - 15.5 g/dL Final   HCT  Date Value Ref Range Status  06/27/2023 41.4 35.0 - 45.0 % Final   MCHC  Date Value Ref Range Status  06/27/2023 35.0 32.0 - 36.0 g/dL Final   [facility]  Date Value Ref Range Status  06/27/2023 32.0 27.0 - 33.0 pg Final   MCV  Date Value Ref Range Status  06/27/2023 91.4 80.0 - 100.0 fL Final   No results found for: "PLTCOUNTKUC", "LABPLAT", "POCPLA" RDW  Date Value Ref Range Status  06/27/2023 12.0 11.0 - 15.0 % Final         Passed - TSH in normal range and within 360 days    TSH  Date Value Ref Range Status  06/06/2023 1.060 0.450 - 4.500 uIU/mL Final         Passed - Completed PHQ-2 or PHQ-9 in the last 360 days      Passed - Last BP in normal range    BP Readings from Last 1 Encounters:  08/16/23 111/75         Passed - Last Heart Rate in normal range    Pulse Readings from Last 1 Encounters:  08/16/23 (!) 103         Passed - Valid encounter within last 6 months    Recent Outpatient Visits           3 months ago Chronic hepatitis C without hepatic coma (HCC)   [facility] [place]. [facility] Marcine Matar, MD   1 year ago Chronic hepatitis C without hepatic coma [facility])   [facility] - A Dept Of [place]. [facility] [place], Marylene Land M, [place]              Passed - [place] within normal limits and completed in the last 12 months    Albumin  Date Value Ref Range Status  04/29/2022 4.8 3.5 - 5.0 g/dL Final   Alkaline Phosphatase  Date Value Ref Range Status  04/29/2022 42 38 - 126 U/L Final   Alkaline phosphatase (APISO)  Date Value Ref Range Status  08/25/2023 60 31 - 125 U/L Final   ALT  Date Value Ref Range Status  08/25/2023 21 6 - 29 U/L Final  06/27/2023 37 (H) 6 - 29 U/L Final   AST  Date Value Ref Range Status  08/25/2023 19 10 - 30 U/L Final   BUN  Date Value Ref Range Status  08/25/2023 7 7 - 25  mg/dL Final   Calcium  Date Value Ref Range Status  08/25/2023 9.9 8.6 - 10.2 mg/dL Final   CO2  Date Value Ref Range Status  08/25/2023 29 20 - 32 mmol/L Final   Creat  Date Value Ref Range Status  08/25/2023 0.83 0.50 - 0.97 mg/dL Final   Glucose, Bld  Date Value Ref Range Status  08/25/2023 106 (H) 65 - 99 mg/dL Final    Comment:    .            Fasting reference interval . For someone without known diabetes, a glucose value between 100 and 125 mg/dL is consistent with prediabetes and should be confirmed with a follow-up test. .    Glucose-Capillary  Date Value Ref Range Status  04/08/2022 153 (H) 70 - 99 mg/dL Final    Comment:    Glucose reference range applies only to samples taken after fasting for at least 8 hours.   Potassium  Date Value Ref Range Status  08/25/2023 4.1 3.5 - 5.3 mmol/L Final   Sodium  Date Value Ref Range Status  08/25/2023 139 135 - 146 mmol/L Final   Total Bilirubin  Date Value Ref Range Status  08/25/2023 0.6 0.2 - 1.2 mg/dL Final   Protein, ur  Date Value Ref Range Status  04/29/2022 NEGATIVE NEGATIVE mg/dL Final   Total Protein  Date Value Ref Range Status  08/25/2023 7.2 6.1 - 8.1 g/dL Final   GFR calc Af Amer  Date Value Ref Range Status  06/24/2009  >60 mL/min Final   NOT CALCULATED        The eGFR has been  calculated using the MDRD equation. This calculation has not been validated in all clinical situations. eGFR's persistently <60 mL/min signify possible Chronic Kidney Disease.   eGFR  Date Value Ref Range Status  06/27/2023 79 > OR = 60 mL/min/1.25m2 Final   GFR, Estimated  Date Value Ref Range Status  04/29/2022 >60 >60 mL/min Final    Comment:    (NOTE) Calculated using the CKD-EPI Creatinine Equation (2021)           gabapentin (NEURONTIN) 300 MG capsule      Sig: Take by mouth.     Neurology: Anticonvulsants - gabapentin Passed - 09/13/2023  4:22 PM      Passed - Cr in normal range and  within 360 days    Creat  Date Value Ref Range Status  08/25/2023 0.83 0.50 - 0.97 mg/dL Final         Passed - Completed PHQ-2 or PHQ-9 in the last 360 days      Passed - Valid encounter within last 12 months    Recent Outpatient Visits           3 months ago Chronic hepatitis C without hepatic coma (HCC)   [facility] [place]. [facility] Marcine Matar, MD   1 year ago Chronic hepatitis C without hepatic coma [facility])   [facility] - A Dept Of . [facility], Marylene Land M, PA-C               QUEtiapine (SEROQUEL) 50 MG tablet      Sig: Take by mouth.     Not Delegated - Psychiatry:  Antipsychotics - Second Generation (Atypical) - quetiapine Failed - 09/13/2023  4:22 PM      Failed - This refill cannot be delegated      Failed - Lipid Panel in normal range within the last 12 months    Cholesterol  Date Value Ref Range Status  01/15/2022 171 0 - 200 mg/dL Final   LDL Cholesterol  Date Value Ref Range Status  01/15/2022 133 (H) 0 - 99 mg/dL Final    Comment:           Total Cholesterol/HDL:CHD Risk Coronary Heart Disease Risk Table                     Men   Women  1/2 Average Risk   3.4   3.3  Average Risk       5.0   4.4  2 X Average Risk   9.6   7.1  3 X Average Risk  23.4   11.0        Use the calculated Patient Ratio above and the CHD Risk Table to determine the patient's CHD Risk.        ATP III CLASSIFICATION (LDL):  <100     mg/dL   Optimal  132-440  mg/dL   Near or Above                    Optimal  130-159  mg/dL   Borderline  102-725  mg/dL   High  >366     mg/dL   Very High Performed at [facility], [address], [place], [place] [address]    HDL  Date Value Ref Range Status  01/15/2022 36 (L) >40 mg/dL Final   Triglycerides  Date Value Ref Range Status  01/15/2022 10 <150 mg/dL Final         Failed - CBC within normal limits and completed in  the last 12 months    WBC  Date Value Ref Range Status  06/27/2023 8.0 3.8 - 10.8 Thousand/uL Final   RBC  Date Value Ref Range Status  06/27/2023 4.53 3.80 - 5.10 Million/uL Final   Hemoglobin  Date Value Ref Range Status  06/27/2023 14.5 11.7 - 15.5 g/dL Final   HCT  Date Value Ref Range Status  06/27/2023 41.4 35.0 - 45.0 % Final   MCHC  Date Value Ref Range Status  06/27/2023 35.0 32.0 - 36.0 g/dL Final   [facility]  Date Value Ref Range Status  06/27/2023 32.0 27.0 - 33.0 pg Final   MCV  Date Value Ref Range Status  06/27/2023 91.4 80.0 - 100.0 fL Final   No results found for: "PLTCOUNTKUC", "LABPLAT", "POCPLA" RDW  Date Value Ref Range Status  06/27/2023 12.0 11.0 - 15.0 % Final         Passed - TSH in normal range and within 360 days    TSH  Date Value Ref Range Status  06/06/2023 1.060 0.450 - 4.500 uIU/mL Final         Passed - Last BP in normal range    BP Readings from Last 1 Encounters:  08/16/23 111/75         Passed - Last Heart Rate in normal range    Pulse Readings from Last 1 Encounters:  08/16/23 (!) 103         Passed - Valid encounter within last 6 months    Recent Outpatient Visits           3 months ago Chronic hepatitis C without hepatic coma (HCC)   [facility] Dept Of [place]. [facility] Marcine Matar, MD   1 year ago Chronic hepatitis C without hepatic coma [facility])   [facility] [place] [facility] - A Dept Of [place]. [facility], Marylene Land M, [place]              Passed - [place] within normal limits and completed in the last 12 months    Albumin  Date Value Ref Range Status  04/29/2022 4.8 3.5 - 5.0 g/dL Final   Alkaline Phosphatase  Date Value Ref Range Status  04/29/2022 42 38 - 126 U/L Final   Alkaline phosphatase (APISO)  Date Value Ref Range Status  08/25/2023 60 31 - 125 U/L Final   ALT  Date Value Ref Range Status  08/25/2023 21 6 - 29 U/L Final  06/27/2023 37  (H) 6 - 29 U/L Final   AST  Date Value Ref Range Status  08/25/2023 19 10 - 30 U/L Final   BUN  Date Value Ref Range Status  08/25/2023 7 7 - 25 mg/dL Final   Calcium  Date Value Ref Range Status  08/25/2023 9.9 8.6 - 10.2 mg/dL Final   CO2  Date Value Ref Range Status  08/25/2023 29 20 - 32 mmol/L Final   Creat  Date Value Ref Range Status  08/25/2023 0.83 0.50 - 0.97 mg/dL Final   Glucose, Bld  Date Value Ref Range Status  08/25/2023 106 (H) 65 - 99 mg/dL Final    Comment:    .            Fasting reference interval . For someone without known diabetes, a glucose value  between 100 and 125 mg/dL is consistent with prediabetes and should be confirmed with a follow-up test. .    Glucose-Capillary  Date Value Ref Range Status  04/08/2022 153 (H) 70 - 99 mg/dL Final    Comment:    Glucose reference range applies only to samples taken after fasting for at least 8 hours.   Potassium  Date Value Ref Range Status  08/25/2023 4.1 3.5 - 5.3 mmol/L Final   Sodium  Date Value Ref Range Status  08/25/2023 139 135 - 146 mmol/L Final   Total Bilirubin  Date Value Ref Range Status  08/25/2023 0.6 0.2 - 1.2 mg/dL Final   Protein, ur  Date Value Ref Range Status  04/29/2022 NEGATIVE NEGATIVE mg/dL Final   Total Protein  Date Value Ref Range Status  08/25/2023 7.2 6.1 - 8.1 g/dL Final   GFR calc Af Amer  Date Value Ref Range Status  06/24/2009  >60 mL/min Final   NOT CALCULATED        The eGFR has been calculated using the MDRD equation. This calculation has not been validated in all clinical situations. eGFR's persistently <60 mL/min signify possible Chronic Kidney Disease.   eGFR  Date Value Ref Range Status  06/27/2023 79 > OR = 60 mL/min/1.39m2 Final   GFR, Estimated  Date Value Ref Range Status  04/29/2022 >60 >60 mL/min Final    Comment:    (NOTE) Calculated using the CKD-EPI Creatinine Equation (2021)

## 2023-09-28 NOTE — Progress Notes (Signed)
HPI: Barbara Gordon is a 32 y.o. female who presents to the [facility] pharmacy clinic for Hepatitis C follow-up.  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   Polysubstance abuse (HCC) 05/24/2021   Substance-induced disorder (HCC) 05/24/2021   Malingering 04/29/2021   Severe cocaine use disorder (HCC) 04/28/2021   Amphetamine intoxication with perceptual disturbance (HCC) 04/27/2021   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   Elevated CPK 01/01/2021   Amphetamine use disorder, 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   GLECAPREVIR-PIBRENTASVIR (MAVYRET) 100-40 MG TABS    Take 3 tablets by mouth daily after supper.   IBUPROFEN (ADVIL) 600 MG TABLET    Take 600 mg by mouth every 6 (six) hours as needed.   OLANZAPINE (ZYPREXA) 10 MG TABLET    take 1 tab by mouth  at bedtime   PROMETHAZINE (PHENERGAN) 12.5 MG TABLET    Take 1 tablet (12.5 mg total) by mouth every 6 (six) hours as needed for nausea or vomiting. Take 1 tablet 30 minutes to 1 hour before taking Mavyret.   QUETIAPINE (SEROQUEL) 50 MG TABLET    Take by mouth.  Modified Medications   No medications on file  Discontinued Medications   No medications on file    Labs: Hepatitis C Lab Results  Component Value Date   HCVGENOTYPE 2 06/27/2023   HCVRNAPCRQN <15 08/25/2023   HCVRNAPCRQN 1,940,000 (H) 06/27/2023   FIBROSTAGE F0-F1 06/27/2023   Hepatitis B Lab Results  Component Value Date   HEPBSAB NON-REACTIVE 06/27/2023   HEPBSAG NON-REACTIVE 06/27/2023   Hepatitis A Lab Results  Component Value Date   HAV REACTIVE (A) 06/27/2023   HIV Lab Results  Component Value Date   HIV Non Reactive 06/06/2023   HIV Non Reactive 01/19/2022   HIV Non Reactive 10/08/2021   Lab Results  Component Value Date   CREATININE 0.83 08/25/2023   CREATININE 0.98 (H) 06/27/2023   CREATININE 0.66 04/29/2022   CREATININE 0.62 04/27/2022   CREATININE 0.74 04/24/2022   Lab Results  Component Value Date   AST 19 08/25/2023   AST 26 06/27/2023   AST 27 04/29/2022   ALT 21 08/25/2023   ALT 37 (H) 06/27/2023   ALT 39 (H) 06/27/2023   INR 1.1 04/10/2022    Assessment: Barbara Gordon presents today for end of treatment follow up Mavyret. She reports ***. *** missed doses. Congratulated her on this. Her  on treatment viral load was undetectable on 08/25/23, indicating good adherence as well. Pretreatment LFTs within normal limits.   Plan: - Follow up with Barbara Gordon in 12 weeks for SVR - Call with any questions or concerns  Lora Paula, PharmD PGY-2 Infectious Diseases Pharmacy Resident [facility] for Infectious Disease

## 2023-09-29 ENCOUNTER — Ambulatory Visit: Payer: MEDICAID | Admitting: Pharmacist

## 2023-09-29 ENCOUNTER — Other Ambulatory Visit: Payer: Self-pay

## 2023-09-29 DIAGNOSIS — B182 Chronic viral hepatitis C: Secondary | ICD-10-CM | POA: Diagnosis not present

## 2023-09-30 LAB — HCG, QUANTITATIVE, PREGNANCY: HCG, Total, QN: <5 m[IU]/mL

## 2023-10-26 ENCOUNTER — Ambulatory Visit (HOSPITAL_COMMUNITY)
Admission: EM | Admit: 2023-10-26 | Discharge: 2023-10-26 | Disposition: A | Discharge status: Home / Self Care | Payer: MEDICAID | Attending: Psychiatry | Admitting: Psychiatry

## 2023-10-26 ENCOUNTER — Other Ambulatory Visit: Payer: Self-pay

## 2023-10-26 DIAGNOSIS — Z5900 Homelessness unspecified: Secondary | ICD-10-CM | POA: Insufficient documentation

## 2023-10-26 DIAGNOSIS — Z765 Malingerer [conscious simulation]: Secondary | ICD-10-CM | POA: Insufficient documentation

## 2023-10-26 NOTE — Discharge Instructions (Signed)
F/u with shelter for housing

## 2023-10-26 NOTE — Progress Notes (Signed)
   10/26/23 2216  [facility] Triage Screening (Walk-ins at [facility] only)  How Did You Hear About [name]? Self  What Is the Reason for Your Visit/Call Today? Pt presents to [facility] as a voluntary walk-in, accompanied by a friend requesting medication refill. Pt appears to be under the influence, however she denies substance/alcohol use at this time. Pt responds " I don't know I guess" to most questions that were posed. Pt does have bag of medications in her possession (Olanzapine and Gabapentin). Pt was not able to express when she last took medications. Pt does deny SI,HI,AVH and substance/alcohol use.  How Long Has This Been Causing You Problems? <Week  Have You Recently Had Any Thoughts About Hurting Yourself? No  Are You Planning to Commit Suicide/Harm Yourself At This time? No  Have you Recently Had Thoughts About Hurting Someone [name]? No  Are You Planning To Harm Someone At This Time? No  Physical Abuse Denies  Verbal Abuse Denies  Sexual Abuse Denies  Exploitation of patient/patient's resources Denies  Self-Neglect Denies  Are you currently experiencing any auditory, visual or other hallucinations? No  Have You Used Any Alcohol or Drugs in the Past 24 Hours? No (pt denies, however current presentations suggests otherwise)  Do you have any current medical co-morbidities that require immediate attention? No  Clinician description of patient physical appearance/behavior: Restless, fidgety, anxious  What Do You Feel Would Help You the Most Today? Treatment for Depression or other mood problem  If access to [facility] was not available, would you have sought care in the Emergency Department? No  Determination of Need Routine (7 days)  Options For Referral Other: Comment;Medication Management

## 2023-10-26 NOTE — ED Provider Notes (Signed)
Behavioral Health Urgent Care Medical Screening Exam  Patient Name: Barbara Gordon MRN: [id number] Date of Evaluation: 10/27/23 Chief Complaint:  I need a room to sleep Diagnosis:  Final diagnoses:  Homelessness  Malingering    History of Present illness: Barbara Gordon is a 32 y.o. female. With a history of substance abuse,  SI, GAD, psychosis,  presented to [facility] voluntarily.  Per the patient she needs a room so she can sleep.  Writer asked patient if she was homeless patient stated no, Clinical research associate did ask why she needs a room if she is not homeless can she go home patient said yes.  Patient was asked if she was doing drugs tonight patient stated no.  But does look like she was under the influence.  Face-to-face evaluation of patient, she is alert and oriented to person and place, speech is clear but muffled at times.  Patient affect is flat she is fairly groomed, patient denies SI, HI, AVH or paranoia.  Denies access to guns.  Denies wanting to hurt herself or others.  Asking her if she was seeking medication refill because that is what she said when she was triage patient stated no she needs somewhere to stay.  Writer discussed with patient that she need to reach out to the shelter for assistance if she needs somewhere to stay for the night.  Patient showed no immediate sign of distress was corporative.  Patient is advised to call 911 or go to the nearest emergency room should she experience any suicidal ideation hallucination or homicidal ideation.  Patient was advised and to reach out to the shelter for assistance Pt was also given resources for outpatient services.  Recommend discharge for patient to follow-up with the shelter. Flowsheet Row ED from 10/26/2023 in [facility] ED from 08/16/2023 in [facility] Emergency Department at [facility] ED from 04/29/2022 in [facility] Emergency Department at [facility]  C-SSRS RISK CATEGORY No Risk No Risk  Error: Q3, 4, or 5 should not be populated when Q2 is No       Psychiatric Specialty Exam  Presentation  General Appearance:Casual  Eye Contact:Good  Speech:Garbled  Speech Volume:Decreased  Handedness:Right   Mood and Affect  Mood: Anxious; Dysphoric  Affect: Flat   Thought Process  Thought Processes: Linear  Descriptions of Associations:Intact  Orientation:Full (Time, Place and Person)  Thought Content:Obsessions  Diagnosis of Schizophrenia or Schizoaffective disorder in past: No data recorded Duration of Psychotic Symptoms: No data recorded Hallucinations:None  Ideas of Reference:None  Suicidal Thoughts:No  Homicidal Thoughts:No   Sensorium  Memory: Immediate Fair  Judgment: Poor  Insight: Lacking   Executive Functions  Concentration: Fair  Attention Span: Fair  Recall: Fair  Fund of Knowledge: Good  Language: Fair   Psychomotor Activity  Psychomotor Activity: Normal   Assets  Assets: Desire for Improvement; Housing   Sleep  Sleep: Fair  Number of hours:  6   Physical Exam: Physical Exam HENT:     Head: Normocephalic.     Nose: Nose normal.  Eyes:     Pupils: Pupils are equal, round, and reactive to light.  Cardiovascular:     Rate and Rhythm: Normal rate.  Pulmonary:     Effort: Pulmonary effort is normal.  Musculoskeletal:        General: Normal range of motion.     Cervical back: Normal range of motion.  Neurological:     General: No focal deficit present.  Mental Status: She is alert.  Psychiatric:        Mood and Affect: Mood normal.        Behavior: Behavior normal.        Thought Content: Thought content normal.        Judgment: Judgment normal.    Review of Systems  Constitutional: Negative.   HENT: Negative.    Eyes: Negative.   Respiratory: Negative.    Cardiovascular: Negative.   Gastrointestinal: Negative.   Genitourinary: Negative.   Musculoskeletal: Negative.   Skin:  Negative.   Neurological: Negative.   Psychiatric/Behavioral:  The patient is nervous/anxious.    Blood pressure 109/80, pulse 98, temperature 98.3 F (36.8 C), temperature source Oral, resp. rate 20, SpO2 97%. There is no height or weight on file to calculate BMI.  Musculoskeletal: Strength & Muscle Tone: within normal limits Gait & Station: normal Patient leans: N/A   [facility] MSE Discharge Disposition for Follow up and Recommendations: Based on my evaluation the patient does not appear to have an emergency medical condition and can be discharged with resources and follow up care in outpatient services for was given information to shelter   Sindy Guadeloupe, NP 10/27/2023, 5:38 AM

## 2023-10-27 ENCOUNTER — Emergency Department (HOSPITAL_BASED_OUTPATIENT_CLINIC_OR_DEPARTMENT_OTHER)
Admission: EM | Admit: 2023-10-27 | Discharge: 2023-10-28 | Disposition: A | Discharge status: Home / Self Care | Payer: MEDICAID | Attending: Emergency Medicine | Admitting: Emergency Medicine

## 2023-10-27 ENCOUNTER — Other Ambulatory Visit: Payer: Self-pay

## 2023-10-27 ENCOUNTER — Encounter (HOSPITAL_BASED_OUTPATIENT_CLINIC_OR_DEPARTMENT_OTHER): Payer: Self-pay

## 2023-10-27 DIAGNOSIS — F99 Mental disorder, not otherwise specified: Secondary | ICD-10-CM | POA: Insufficient documentation

## 2023-10-27 DIAGNOSIS — B1920 Unspecified viral hepatitis C without hepatic coma: Secondary | ICD-10-CM | POA: Insufficient documentation

## 2023-10-27 DIAGNOSIS — F1290 Cannabis use, unspecified, uncomplicated: Secondary | ICD-10-CM | POA: Insufficient documentation

## 2023-10-27 DIAGNOSIS — F200 Paranoid schizophrenia: Secondary | ICD-10-CM | POA: Insufficient documentation

## 2023-10-27 DIAGNOSIS — F22 Delusional disorders: Secondary | ICD-10-CM | POA: Diagnosis present

## 2023-10-27 DIAGNOSIS — F1220 Cannabis dependence, uncomplicated: Secondary | ICD-10-CM | POA: Diagnosis not present

## 2023-10-27 DIAGNOSIS — I10 Essential (primary) hypertension: Secondary | ICD-10-CM | POA: Diagnosis not present

## 2023-10-27 DIAGNOSIS — Z5901 Sheltered homelessness: Secondary | ICD-10-CM | POA: Insufficient documentation

## 2023-10-27 DIAGNOSIS — F29 Unspecified psychosis not due to a substance or known physiological condition: Secondary | ICD-10-CM | POA: Diagnosis present

## 2023-10-27 DIAGNOSIS — Z91040 Latex allergy status: Secondary | ICD-10-CM | POA: Insufficient documentation

## 2023-10-27 DIAGNOSIS — R42 Dizziness and giddiness: Secondary | ICD-10-CM | POA: Diagnosis present

## 2023-10-27 DIAGNOSIS — F1910 Other psychoactive substance abuse, uncomplicated: Secondary | ICD-10-CM | POA: Insufficient documentation

## 2023-10-27 DIAGNOSIS — E039 Hypothyroidism, unspecified: Secondary | ICD-10-CM | POA: Diagnosis not present

## 2023-10-27 DIAGNOSIS — Z765 Malingerer [conscious simulation]: Secondary | ICD-10-CM | POA: Diagnosis not present

## 2023-10-27 DIAGNOSIS — Z9151 Personal history of suicidal behavior: Secondary | ICD-10-CM | POA: Diagnosis not present

## 2023-10-27 DIAGNOSIS — E876 Hypokalemia: Secondary | ICD-10-CM | POA: Diagnosis not present

## 2023-10-27 LAB — ETHANOL: Alcohol, Ethyl (B): <10 mg/dL (ref <10)

## 2023-10-27 LAB — COMPREHENSIVE METABOLIC PANEL
ALT: 10 U/L (ref 0–44)
AST: 14 U/L — ABNORMAL LOW (ref 15–41)
Albumin: 4.6 g/dL (ref 3.5–5.0)
Alkaline Phosphatase: 51 U/L (ref 38–126)
Anion gap: 11 (ref 5–15)
BUN: 6 mg/dL (ref 6–20)
CO2: 22 mmol/L (ref 22–32)
Calcium: 9.6 mg/dL (ref 8.9–10.3)
Chloride: 101 mmol/L (ref 98–111)
Creatinine, Ser: 0.7 mg/dL (ref 0.44–1.00)
GFR, Estimated: >60 mL/min (ref >60)
Glucose, Bld: 140 mg/dL — ABNORMAL HIGH (ref 70–99)
Potassium: 3.1 mmol/L — ABNORMAL LOW (ref 3.5–5.1)
Sodium: 134 mmol/L — ABNORMAL LOW (ref 135–145)
Total Bilirubin: 0.6 mg/dL (ref <1.2)
Total Protein: 7.5 g/dL (ref 6.5–8.1)

## 2023-10-27 LAB — CBC
HCT: 40 % (ref 36.0–46.0)
Hemoglobin: 14.7 g/dL (ref 12.0–15.0)
MCH: 31.5 pg (ref 26.0–34.0)
MCHC: 36.8 g/dL — ABNORMAL HIGH (ref 30.0–36.0)
MCV: 85.7 fL (ref 80.0–100.0)
Platelets: 259 10*3/uL (ref 150–400)
RBC: 4.67 MIL/uL (ref 3.87–5.11)
RDW: 12.3 % (ref 11.5–15.5)
WBC: 7.3 10*3/uL (ref 4.0–10.5)
nRBC: 0 % (ref 0.0–0.2)

## 2023-10-27 LAB — PREGNANCY, URINE: Preg Test, Ur: NEGATIVE

## 2023-10-27 LAB — RAPID URINE DRUG SCREEN, HOSP PERFORMED
Amphetamines: NOT DETECTED
Barbiturates: NOT DETECTED
Benzodiazepines: NOT DETECTED
Cocaine: NOT DETECTED
Opiates: NOT DETECTED
Tetrahydrocannabinol: POSITIVE — AB

## 2023-10-27 MED ORDER — NICOTINE 7 MG/24HR TD PT24
7.0000 mg | MEDICATED_PATCH | Freq: Once | TRANSDERMAL | Status: DC
  Administered 2023-10-27: 7 mg via TRANSDERMAL
  Filled 2023-10-27: qty 1

## 2023-10-27 MED ORDER — QUETIAPINE FUMARATE 50 MG PO TABS
50.0000 mg | ORAL_TABLET | Freq: Every day | ORAL | Status: DC
  Administered 2023-10-28: 50 mg via ORAL
  Filled 2023-10-27: qty 1

## 2023-10-27 MED ORDER — GABAPENTIN 300 MG PO CAPS
300.0000 mg | ORAL_CAPSULE | Freq: Every day | ORAL | Status: DC
Start: 2023-10-27 — End: 2023-10-28
  Administered 2023-10-28: 300 mg via ORAL
  Filled 2023-10-27: qty 1

## 2023-10-27 NOTE — BH Assessment (Signed)
Clinician sent secure chat to patient's RN, informing that patient will be seen by TTS counselor as soon as possible.  TTS staff will be added to secure chat to inform RN of their availability.

## 2023-10-27 NOTE — ED Notes (Signed)
Checking with Behavioral Health re: TTS by Nurse Manager. Waiting for update

## 2023-10-27 NOTE — ED Triage Notes (Signed)
Pt w friend who states that she has been "going through it recently." Pt advises she "just doesn't feel right," keeps asking to go to sleep in triage.   EDP in triage to evaluate

## 2023-10-27 NOTE — Consult Note (Signed)
Barbara Gordon Consult Note  Patient Name: Barbara Gordon MRN: [id number] DOB: [birth year] DATE OF Consult: 10/27/2023  PRIMARY PSYCHIATRIC DIAGNOSES  1.  Psychosis, Unspecified 2.  Cannabis Use Disorder, Severe   RECOMMENDATIONS  Recommendations: Medication recommendations:  -- Seroquel 50mg  po at bedtime for mood stabilization, paranoia, sleep -- Gabapentin 300mg  po at bedtime for sleep, anxiety   Non-Medication/therapeutic recommendations:  -- SW Consultation to discuss rehabilitation options/ sober living options/ resources -- Outpatient psychiatric resources for follow-up -- Encouraged to return to the ED if symptoms worsen and/or if she begins to experience any suicidal or homicidal ideations.   Is inpatient psychiatric hospitalization recommended for this patient?  -- Patient does not meet criteria for inpatient psychiatric hospitalization at this time   Communication: Treatment team members (and family members if applicable) who were involved in treatment/care discussions and planning, and with whom we spoke or engaged with via secure text/chat, include the following: ED primary team   Thank you for involving [name] in the care of this patient. If you have any additional questions or concerns, please call [phone] and ask for me or the provider on-call.  Gordon ATTESTATION & CONSENT  As the provider for this telehealth consult, I attest that I verified the patient's identity using two separate identifiers, introduced myself to the patient, provided my credentials, disclosed my location, and performed this encounter via a HIPAA-compliant, real-time, face-to-face, two-way, interactive audio and video platform and with the full consent and agreement of the patient (or guardian as applicable.)  Patient physical location: ED in [facility]. Telehealth provider physical location: home office in state of [place].  Video start time: 2151 Barbara Gordon Time) Video  end time: 2203 (Central Time)  IDENTIFYING DATA  Barbara Gordon is a 32 y.o. year-old female for whom a psychiatric consultation has been ordered by the primary provider. The patient was identified using two separate identifiers.  CHIEF COMPLAINT/REASON FOR CONSULT  Paranoia    HISTORY OF PRESENT ILLNESS (HPI)  The patient is a 32yo female who presented to the emergency department with concern for needing to sleep.   Patient seen at [facility] for similar complaint on 10/26/2023- requesting a room for sleep. She was given homeless shelter resources and discharged.   Patient is calm and cooperative, pleasant, polite. Alert and oriented x3. Patient states she came to the hospital because she has been without her medications for the last 2 weeks and beginning to have "crazy thoughts". Patient states she was previously prescribed Seroquel, Zyprexa, and Gabapentin. States she does not have an outpatient provider and ran out of refills. Patient endorses paranoia believing that people are out to get her. She does show insight and states she knows these thoughts are not real and that they are "in my head". However the paranoia is causing heightened anxiety at times. She denies hallucinations, no delusions apparent otherwise. She does not appear to be responding to internal stimuli, no thought blocking noted. Behavior appropriate. Reports sleeping and eating well. No concern for lethargy, low motivation, helplessness, hopelessness, worthlessness. She denies suicidal and homicidal ideations. Past suicide attempt in 2021. Denies self-injurious behaviors.   Patient asks about potential options for substance abuse treatment programs. States she smokes marijuana. Last use was earlier today. States she is currently on probation for felony drug charges and selling illicit substances. She currently lives with a friend who smokes marijuana and feels if she doesn't leave she'll end up back in jail for violating probation. Would  like to discuss sober  living options. Denies additional drug use at this time, no alcohol. Has 1 son that currently does not reside with her. States she would like to improve her lifestyle and health for her son in hopes he can return to live with her in the near future.     PAST PSYCHIATRIC HISTORY  Past psychiatric history of Substance Induced Psychosis, Polysubstance Abuse (amphetamines, cocaine, opioids, cannabis, anxiolytics) borderline personality disorder, malingering, suicide attempts (per chart review)  Prior Hospitalizations- January 2024 [facility] for Substance Induced Psychotic Disorder, SI (per chart review) Prior Psychotropic Medications- Olanzapine, Risperdal (per chart review)  No current outpatient psychiatrist/ therapist.  Past suicide attempt- last attempt 2021, per patient.    Otherwise as per HPI above.  PAST MEDICAL HISTORY  Past Medical History:  Diagnosis Date   Hypothyroidism    Polysubstance abuse (HCC)    Schizophrenia (HCC)      HOME MEDICATIONS  PTA Medications  Medication Sig   OLANZapine (ZYPREXA) 10 MG tablet take 1 tab by mouth at bedtime   gabapentin (NEURONTIN) 300 MG capsule Take by mouth.   QUEtiapine (SEROQUEL) 50 MG tablet Take by mouth.   ibuprofen (ADVIL) 600 MG tablet Take 600 mg by mouth every 6 (six) hours as needed.   promethazine (PHENERGAN) 12.5 MG tablet Take 1 tablet (12.5 mg total) by mouth every 6 (six) hours as needed for nausea or vomiting. Take 1 tablet 30 minutes to 1 hour before taking Mavyret.     ALLERGIES  Allergies  Allergen Reactions   Latex Itching and Rash   Tramadol Itching and Other (See Comments)    Provider: Crist Fat CFM - Allergy Description: TraMADol HCl *ANALGESICS - OPIOID* CFM - Allergy Annotation: Pruritus.     SOCIAL & SUBSTANCE USE HISTORY  Social History   Socioeconomic History   Marital status: Single    Spouse name: Not on file   Number of children: Not on file   Years of  education: Not on file   Highest education level: Not on file  Occupational History   Not on file  Tobacco Use   Smoking status: Some Days    Types: Cigarettes, E-cigarettes   Smokeless tobacco: Never  Vaping Use   Vaping status: Never Used  Substance and Sexual Activity   Alcohol use: Not Currently   Drug use: Not Currently    Types: Cocaine, Amphetamines, Methamphetamines, Marijuana   Sexual activity: Never  Other Topics Concern   Not on file  Social History Narrative   Not on file   Social Drivers of Health   Financial Resource Strain: Low Risk  (11/20/2022)   Received from [facility]   Overall Financial Resource Strain (CARDIA)    Difficulty of Paying Living Expenses: Not very hard  Food Insecurity: Not on file  Transportation Needs: Not on file  Physical Activity: Not on file  Stress: No Stress Concern Present (11/20/2022)   Received from [facility] - Occupational Stress Questionnaire    Feeling of Stress : Only a little  Social Connections: Unknown (03/09/2022)   Received from [facility]   Social Network    Social Network: Not on file   Social History   Tobacco Use  Smoking Status Some Days   Types: Cigarettes, E-cigarettes  Smokeless Tobacco Never   Social History   Substance and Sexual Activity  Alcohol Use Not Currently   Social History   Substance and Sexual Activity  Drug  Use Not Currently   Types: Cocaine, Amphetamines, Methamphetamines, Marijuana    Additional pertinent information: lives with   FAMILY HISTORY  History reviewed. No pertinent family history. Family Psychiatric History (if known):  None disclosed at this time   MENTAL STATUS EXAM (MSE)  Mental Status Exam: General Appearance: Well Groomed  Orientation:  Full (Time, Place, and Person)  Memory:  Recent;   Fair  Concentration:  Concentration: Fair and Attention Span: Good  Recall:  Fair  Attention  Good  Eye  Contact:  Good  Speech:  Clear and Coherent  Language:  Good  Volume:  Normal  Mood: anxious  Affect:  Appropriate  Thought Process:  Linear  Thought Content:  Paranoid Ideation  Suicidal Thoughts:  No  Homicidal Thoughts:  No  Judgement:  Poor  Insight:   Fair  Psychomotor Activity:  Normal  Akathisia:  No  Fund of Knowledge:  Fair    Assets:  Manufacturing systems engineer Desire for Improvement Housing Physical Health Social Support  Cognition:  WNL  ADL's:  Intact  AIMS (if indicated):       VITALS  Blood pressure (!) 115/92, pulse 79, temperature 98.1 F (36.7 C), temperature source Oral, resp. rate 18, SpO2 95%.  LABS  Admission on 10/27/2023  Component Date Value Ref Range Status   Sodium 10/27/2023 134 (L)  135 - 145 mmol/L Final   Potassium 10/27/2023 3.1 (L)  3.5 - 5.1 mmol/L Final   Chloride 10/27/2023 101  98 - 111 mmol/L Final   CO2 10/27/2023 22  22 - 32 mmol/L Final   Glucose, Bld 10/27/2023 140 (H)  70 - 99 mg/dL Final   Glucose reference range applies only to samples taken after fasting for at least 8 hours.   BUN 10/27/2023 6  6 - 20 mg/dL Final   Creatinine, Ser 10/27/2023 0.70  0.44 - 1.00 mg/dL Final   Calcium 30/86/5784 9.6  8.9 - 10.3 mg/dL Final   Total Protein 69/62/9528 7.5  6.5 - 8.1 g/dL Final   Albumin 41/32/4401 4.6  3.5 - 5.0 g/dL Final   AST 02/72/5366 14 (L)  15 - 41 U/L Final   ALT 10/27/2023 10  0 - 44 U/L Final   Alkaline Phosphatase 10/27/2023 51  38 - 126 U/L Final   Total Bilirubin 10/27/2023 0.6  <1.2 mg/dL Final   GFR, Estimated 10/27/2023 >60  >60 mL/min Final   Comment: (NOTE) Calculated using the CKD-EPI Creatinine Equation (2021)    Anion gap 10/27/2023 11  5 - 15 Final   Performed at [facility], [address], [place], [place] [address]   Alcohol, Ethyl (B) 10/27/2023 <10  <10 mg/dL Final   Comment: (NOTE) Lowest detectable limit for serum alcohol is 10 mg/dL.  For medical purposes only. Performed at  [facility], [address], [place], [place] [address]    WBC 10/27/2023 7.3  4.0 - 10.5 K/uL Final   RBC 10/27/2023 4.67  3.87 - 5.11 MIL/uL Final   Hemoglobin 10/27/2023 14.7  12.0 - 15.0 g/dL Final   HCT 56/38/7564 40.0  36.0 - 46.0 % Final   MCV 10/27/2023 85.7  80.0 - 100.0 fL Final   MCH 10/27/2023 31.5  26.0 - 34.0 pg Final   MCHC 10/27/2023 36.8 (H)  30.0 - 36.0 g/dL Final   RDW 33/29/5188 12.3  11.5 - 15.5 % Final   Platelets 10/27/2023 259  150 - 400 K/uL Final   nRBC 10/27/2023 0.0  0.0 -  0.2 % Final   Performed at [facility], [address], [place], [place] [address]   Opiates 10/27/2023 NONE DETECTED  NONE DETECTED Final   Cocaine 10/27/2023 NONE DETECTED  NONE DETECTED Final   Benzodiazepines 10/27/2023 NONE DETECTED  NONE DETECTED Final   Amphetamines 10/27/2023 NONE DETECTED  NONE DETECTED Final   Tetrahydrocannabinol 10/27/2023 POSITIVE (A)  NONE DETECTED Final   Barbiturates 10/27/2023 NONE DETECTED  NONE DETECTED Final   Comment: (NOTE) DRUG SCREEN FOR MEDICAL PURPOSES ONLY.  IF CONFIRMATION IS NEEDED FOR ANY PURPOSE, NOTIFY LAB WITHIN 5 DAYS.  LOWEST DETECTABLE LIMITS FOR URINE DRUG SCREEN Drug Class                     Cutoff (ng/mL) Amphetamine and metabolites    1000 Barbiturate and metabolites    200 Benzodiazepine                 200 Opiates and metabolites        300 Cocaine and metabolites        300 THC                            50 Performed at [facility], [address], [place], [place] [address]    Preg Test, Ur 10/27/2023 NEGATIVE  NEGATIVE Final   Comment:        THE SENSITIVITY OF THIS METHODOLOGY IS >25 mIU/mL. Performed at [facility], [address] [place], [place] [address]     PSYCHIATRIC REVIEW OF SYSTEMS (ROS)  ROS: Notable for the following relevant positive findings: Review of Systems  Psychiatric/Behavioral:  Positive for substance  abuse. Negative for depression, hallucinations and suicidal ideas. The patient is nervous/anxious.     Additional findings:      Musculoskeletal: No abnormal movements observed      Gait & Station: Laying/Sitting      Pain Screening: Denies      Nutrition & Dental Concerns: No concerns at this time  RISK FORMULATION/ASSESSMENT  Is the patient experiencing any suicidal or homicidal ideations: No       Explain if yes:  Protective factors considered for safety management: access to care, willingness for improvement, social support, housing  Risk factors/concerns considered for safety management: psychiatric history, legal issues Prior attempt Substance abuse/dependence Unmarried  Is there a Astronomer plan with the patient and treatment team to minimize risk factors and promote protective factors: Yes           Explain: Patient currently in the ED; medical refills, requesting information regarding potential rehabilitation programs/ sober living options. Plan to return home with friend. Provide outpatient psychiatric resources for follow-up.  Is crisis care placement or psychiatric hospitalization recommended: No     Based on my current evaluation and risk assessment, patient is determined at this time to be at:  Low risk  *RISK ASSESSMENT Risk assessment is a dynamic process; it is possible that this patient's condition, and risk level, may change. This should be re-evaluated and managed over time as appropriate. Please re-consult psychiatric consult services if additional assistance is needed in terms of risk assessment and management. If your team decides to discharge this patient, please advise the patient how to best access emergency psychiatric services, or to call 911, if their condition worsens or they feel unsafe in any way.   Assunta Gambles, NP [facility]

## 2023-10-27 NOTE — ED Notes (Signed)
Given juice and chips for snack as requested

## 2023-10-27 NOTE — ED Notes (Signed)
ED Provider at bedside. 

## 2023-10-27 NOTE — ED Notes (Signed)
Patient asked "what are we doing here". She correctly Identified that she had come to the hospital because she "just dont feel right" Reviewed the evaluation process and notified patient that labs and urine results are pending. Patient responded with "can I go to sleep", reinforce that patient is safe and may rest while she is here. Offered to bring guest in lobby back, patient declined.

## 2023-10-27 NOTE — ED Notes (Signed)
Pt sleeping at present time.

## 2023-10-27 NOTE — ED Provider Notes (Signed)
[facility] EMERGENCY DEPARTMENT AT [facility] Provider Note   CSN: [id number] Arrival date & time: 10/27/23  4098     History  No chief complaint on file.   Barbara Gordon is a 32 y.o. female.  Patient's chief complaint is a need to go to sleep.  When I asked patient why she came to the emergency department today she states to sleep.  When I ask her what is going on she tells me she does not know.  When I asked patient if she has any medical problems she tells me she does not know.  I asked patient if she is on medication she states yes but she is unable to tell me what medication she takes.  From patient's old charts that she has a history of substance abuse and paranoid schizophrenia.  Patient is currently being treated for hepatitis C.  Pt Was seen at [facility] yesterday and released.  Patient cannot tell me why she was seen.  I was present when the nurse did her psychiatric screening questions patient states says yes to wishing she were dead.  I asked her if she has any plan to harm herself and she states I do not know.        Home Medications Prior to Admission medications   Medication Sig Start Date End Date Taking? Authorizing Provider  gabapentin (NEURONTIN) 300 MG capsule Take by mouth. 06/17/23   [provider]  ibuprofen (ADVIL) 600 MG tablet Take 600 mg by mouth every 6 (six) hours as needed. 06/21/23   [provider]  OLANZapine (ZYPREXA) 10 MG tablet take 1 tab by mouth at bedtime 04/22/22   Sarita Bottom, MD  promethazine (PHENERGAN) 12.5 MG tablet Take 1 tablet (12.5 mg total) by mouth every 6 (six) hours as needed for nausea or vomiting. Take 1 tablet 30 minutes to 1 hour before taking Mavyret. 08/12/23   [name], RPH-CPP  QUEtiapine (SEROQUEL) 50 MG tablet Take by mouth. 06/17/23   [provider]      Allergies    Latex and Tramadol    Review of Systems   Review of Systems  All other systems reviewed and are  negative.   Physical Exam Updated Vital Signs BP (!) 151/120   Pulse (!) 116   Resp 16   SpO2 95%  Physical Exam Vitals and nursing note reviewed.  Constitutional:      Appearance: She is well-developed.  HENT:     Head: Normocephalic.     Right Ear: Tympanic membrane normal.     Left Ear: Tympanic membrane normal.     Nose: Nose normal.     Mouth/Throat:     Mouth: Mucous membranes are moist.  Cardiovascular:     Rate and Rhythm: Normal rate.  Pulmonary:     Effort: Pulmonary effort is normal.  Abdominal:     General: There is no distension.  Musculoskeletal:        General: Normal range of motion.     Cervical back: Normal range of motion.  Neurological:     Mental Status: She is alert and oriented to person, place, and time.  Psychiatric:     Comments: Patient repetitively asked if she can go to sleep now.  Patient observed in room.  Patient continuously staring but does not go to sleep.     ED Results / Procedures / Treatments   Labs (all labs ordered are listed, but only abnormal results are displayed) Labs Reviewed  COMPREHENSIVE METABOLIC PANEL  ETHANOL  CBC  RAPID URINE DRUG SCREEN, HOSP PERFORMED  PREGNANCY, URINE    EKG None  Radiology No results found.  Procedures Procedures    Medications Ordered in ED Medications - No data to display  ED Course/ Medical Decision Making/ A&P                                 Medical Decision Making Amount and/or Complexity of Data Reviewed Labs: ordered.   I am unable to obtain a very limited history from the patient.  Shunt seems to be paranoid.  By patient's old records she appears to have a history of paranoid schizophrenia.  I obtained laboratory evaluations she is positive for THC.  I will consult TTS for assistance in psychiatric assessment of this patient.        Final Clinical Impression(s) / ED Diagnoses Final diagnoses:  None    Rx / DC Orders ED Discharge Orders     None          Osie Cheeks 10/27/23 1638    Jacalyn Lefevre, MD 10/28/23 [phone]

## 2023-10-27 NOTE — ED Notes (Signed)
TTS in process 

## 2023-10-27 NOTE — ED Notes (Signed)
Pt aware TTS will be calling at 2300 Apple juice given.  Pt calm and cooperative

## 2023-10-28 ENCOUNTER — Other Ambulatory Visit: Payer: Self-pay

## 2023-10-28 ENCOUNTER — Emergency Department (HOSPITAL_BASED_OUTPATIENT_CLINIC_OR_DEPARTMENT_OTHER)
Admission: EM | Admit: 2023-10-28 | Discharge: 2023-10-28 | Disposition: A | Discharge status: Home / Self Care | Payer: MEDICAID | Attending: Emergency Medicine | Admitting: Emergency Medicine

## 2023-10-28 ENCOUNTER — Encounter (HOSPITAL_BASED_OUTPATIENT_CLINIC_OR_DEPARTMENT_OTHER): Payer: Self-pay | Admitting: Emergency Medicine

## 2023-10-28 DIAGNOSIS — R42 Dizziness and giddiness: Secondary | ICD-10-CM | POA: Insufficient documentation

## 2023-10-28 DIAGNOSIS — E876 Hypokalemia: Secondary | ICD-10-CM | POA: Insufficient documentation

## 2023-10-28 DIAGNOSIS — Z5900 Homelessness unspecified: Secondary | ICD-10-CM | POA: Insufficient documentation

## 2023-10-28 DIAGNOSIS — Z765 Malingerer [conscious simulation]: Secondary | ICD-10-CM | POA: Insufficient documentation

## 2023-10-28 DIAGNOSIS — I10 Essential (primary) hypertension: Secondary | ICD-10-CM | POA: Insufficient documentation

## 2023-10-28 DIAGNOSIS — Z91040 Latex allergy status: Secondary | ICD-10-CM | POA: Insufficient documentation

## 2023-10-28 MED ORDER — QUETIAPINE FUMARATE 50 MG PO TABS
50.0000 mg | ORAL_TABLET | Freq: Once | ORAL | Status: AC
  Administered 2023-10-28: 50 mg via ORAL
  Filled 2023-10-28: qty 1

## 2023-10-28 MED ORDER — QUETIAPINE FUMARATE 50 MG PO TABS: 50.0000 mg | ORAL_TABLET | Freq: Every day | ORAL | 0 refills | Status: DC

## 2023-10-28 MED ORDER — GABAPENTIN 100 MG PO CAPS: 300.0000 mg | ORAL_CAPSULE | Freq: Every day | ORAL | 0 refills | Status: DC

## 2023-10-28 MED ORDER — GABAPENTIN 300 MG PO CAPS
300.0000 mg | ORAL_CAPSULE | Freq: Once | ORAL | Status: AC
  Administered 2023-10-28: 300 mg via ORAL
  Filled 2023-10-28: qty 1

## 2023-10-28 NOTE — ED Notes (Signed)
 Pt given fluids... No N/V... Provider informed.Marland KitchenMarland Kitchen

## 2023-10-28 NOTE — ED Provider Notes (Signed)
Barbara Gordon EMERGENCY DEPARTMENT AT [facility] Provider Note   CSN: [id number] Arrival date & time: 10/28/23  1135     History  Chief Complaint  Patient presents with   Dizziness    Barbara Gordon is a 32 y.o. female.   Dizziness   32 year old female presents emergency department with complaints of not feeling right.  Patient has presented to the emergency department 2 over the last 2 days for the exact same symptoms.  States that she was here yesterday and given her at home Seroquel as well as gabapentin and noted improvement of symptoms.  Has been cleared by psychiatry both times regarding visit.  Denies any current suicidal/homicidal ideation, auditory/visual hallucinations.  Denies any chest pain, abdominal pain, nausea, vomiting, shortness of breath, recent falls/injuries.  Unable to describe symptoms more than "just not feeling right."  Denies any additional complaint.  Past medical history significant for polysubstance abuse, schizophrenia, hypothyroidism, suicidal ideation, psychosis  Home Medications Prior to Admission medications   Medication Sig Start Date End Date Taking? Authorizing Provider  gabapentin (NEURONTIN) 100 MG capsule Take 3 capsules (300 mg total) by mouth at bedtime. 10/28/23   Mesner, Barbara Cower, MD  ibuprofen (ADVIL) 600 MG tablet Take 600 mg by mouth every 6 (six) hours as needed. 06/21/23   [provider]  OLANZapine (ZYPREXA) 10 MG tablet take 1 tab by mouth at bedtime 04/22/22   Sarita Bottom, MD  promethazine (PHENERGAN) 12.5 MG tablet Take 1 tablet (12.5 mg total) by mouth every 6 (six) hours as needed for nausea or vomiting. Take 1 tablet 30 minutes to 1 hour before taking Mavyret. 08/12/23   [name], RPH-CPP  QUEtiapine (SEROQUEL) 50 MG tablet Take 1 tablet (50 mg total) by mouth at bedtime. 10/28/23   Mesner, Barbara Cower, MD      Allergies    Latex and Tramadol    Review of Systems   Review of Systems  Neurological:  Positive  for dizziness.  All other systems reviewed and are negative.   Physical Exam Updated Vital Signs BP (!) 155/114 (BP Location: Right Arm)   Pulse 97   Temp 98.5 F (36.9 C) (Oral)   Resp 16   LMP  (LMP Unknown)   SpO2 98%  Physical Exam Vitals and nursing note reviewed.  Constitutional:      General: She is not in acute distress.    Appearance: She is well-developed.  HENT:     Head: Normocephalic and atraumatic.  Eyes:     Conjunctiva/sclera: Conjunctivae normal.  Cardiovascular:     Rate and Rhythm: Normal rate and regular rhythm.     Heart sounds: No murmur heard. Pulmonary:     Effort: Pulmonary effort is normal. No respiratory distress.     Breath sounds: Normal breath sounds. No wheezing, rhonchi or rales.  Abdominal:     Palpations: Abdomen is soft.     Tenderness: There is no abdominal tenderness. There is no guarding.  Musculoskeletal:        General: No swelling.     Cervical back: Neck supple.  Skin:    General: Skin is warm and dry.     Capillary Refill: Capillary refill takes less than 2 seconds.  Neurological:     Mental Status: She is alert.  Psychiatric:        Attention and Perception: Attention normal. She does not perceive auditory or visual hallucinations.        Mood and Affect: Mood normal. Affect is  blunt and flat.        Speech: Speech is delayed. Speech is not rapid and pressured, slurred or tangential.        Behavior: Behavior is cooperative.        Thought Content: Thought content does not include homicidal or suicidal ideation. Thought content does not include homicidal or suicidal plan.     ED Results / Procedures / Treatments   Labs (all labs ordered are listed, but only abnormal results are displayed) Labs Reviewed - No data to display  EKG None  Radiology No results found.  Procedures Procedures    Medications Ordered in ED Medications  QUEtiapine (SEROQUEL) tablet 50 mg (50 mg Oral Given 10/28/23 1313)  gabapentin  (NEURONTIN) capsule 300 mg (300 mg Oral Given 10/28/23 1313)    ED Course/ Medical Decision Making/ A&P                                 Medical Decision Making Risk Prescription drug management.   This patient presents to the ED for concern of "not feeling right", this involves an extensive number of treatment options, and is a complaint that carries with it a high risk of complications and morbidity.  The differential diagnosis includes infectious etiology, electrolyte derangement, suicidal/homicidal ideation, paranoia, psychosis, medication/drug ingestion/side effect, other   Co morbidities that complicate the patient evaluation  See HPI   Additional history obtained:  Additional history obtained from EMR External records from outside source obtained and reviewed including hospital records   Lab Tests:  N/a   Imaging Studies ordered:  N/a   Cardiac Monitoring: / EKG:  The patient was maintained on a cardiac monitor.  I personally viewed and interpreted the cardiac monitored which showed an underlying rhythm of: sinus rhythm   Consultations Obtained:  N/a   Problem List / ED Course / Critical interventions / Medication management  Homelessness, malingering I ordered medication including Seroquel, gabapentin   Reevaluation of the patient after these medicines showed that the patient improved I have reviewed the patients home medicines and have made adjustments as needed   Social Determinants of Health:  Marijuana use.  Denies other substance use.   Test / Admission - Considered:  Homelessness, malingering Vitals signs significant for hypertension blood pressure 155. Otherwise within normal range and stable throughout visit. 32 year old female presents emergency department with complaints of "not feeling well."  Has been seen 2 out of the past 2 days at [facility] urgent care and then emergency department yesterday.  Was cleared by psychiatry both  times.  Patient currently without homicidal/suicidal ideation, auditory/visual destinations or other evidence of acute psychosis; patient does not seem in imminent harm to herself or to others..  Labs performed yesterday which did show evidence of hypokalemia 3.1 but otherwise unremarkable for any acute process.  Repeat labs deemed necessary at this time.  Treated with patient's at home medication with no improvement.  Physical exam benign.  Suspect there is malingering component to patient's continued ER visits.  Will recommend patient follow-up with psychiatry in the outpatient setting for reassessment as well as give patient shelter information treatment plan discussed at length with patient and she acknowledged understanding was agreeable to said plan.  Patient overall well-appearing, afebrile in no acute distress. Worrisome signs and symptoms were discussed with the patient, and the patient acknowledged understanding to return to the ED if noticed. Patient was stable upon discharge.  Final Clinical Impression(s) / ED Diagnoses Final diagnoses:  Malingering  Homelessness    Rx / DC Orders ED Discharge Orders     None         Peter Garter, [place] 10/28/23 1540    Jacalyn Lefevre, MD 10/28/23 1554

## 2023-10-28 NOTE — ED Notes (Signed)
Discharge paperwork given and verbally understood.... Pt understood that she needed to visit the behavior health facility for further care.Barbara KitchenMarland Gordon

## 2023-10-28 NOTE — Discharge Instructions (Addendum)
As discussed, recommend follow-up in the outpatient setting with psychiatry as you have been cleared.  Will recommend continue use of your at home medications as prescribed by prior provider.  Attached is the number for the behavioral health urgent care for reevaluation if your psychiatric needs become more emergent.  Please do not hesitate to return if the worrisome signs and symptoms we discussed become apparent.

## 2023-10-28 NOTE — ED Notes (Signed)
Pt left in the room until her friend arrived for transport home.Marland KitchenMarland Kitchen

## 2023-10-28 NOTE — ED Notes (Signed)
Pt's family arrived and took care of the pt to the vehicle.Marland KitchenMarland Kitchen

## 2023-10-28 NOTE — ED Notes (Signed)
During assessment, Pt only kept saying "I don't know"... When asked orientation questions (where, year, name and birth day) she answers perfectly without hesitation.Marland KitchenMarland KitchenMarland Kitchen

## 2023-10-28 NOTE — ED Triage Notes (Signed)
Pt reports "I dont feel right", denies pain. Endorses feeling dizzy

## 2023-10-29 ENCOUNTER — Emergency Department (HOSPITAL_COMMUNITY)
Admission: EM | Admit: 2023-10-29 | Discharge: 2023-10-29 | Disposition: A | Discharge status: Home / Self Care | Payer: MEDICAID | Attending: Emergency Medicine | Admitting: Emergency Medicine

## 2023-10-29 ENCOUNTER — Encounter (HOSPITAL_COMMUNITY): Payer: Self-pay

## 2023-10-29 DIAGNOSIS — Z659 Problem related to unspecified psychosocial circumstances: Secondary | ICD-10-CM

## 2023-10-29 DIAGNOSIS — F99 Mental disorder, not otherwise specified: Secondary | ICD-10-CM | POA: Insufficient documentation

## 2023-10-29 DIAGNOSIS — E039 Hypothyroidism, unspecified: Secondary | ICD-10-CM | POA: Insufficient documentation

## 2023-10-29 DIAGNOSIS — F17210 Nicotine dependence, cigarettes, uncomplicated: Secondary | ICD-10-CM | POA: Diagnosis not present

## 2023-10-29 DIAGNOSIS — Z91040 Latex allergy status: Secondary | ICD-10-CM | POA: Insufficient documentation

## 2023-10-29 MED ORDER — NICOTINE 14 MG/24HR TD PT24
14.0000 mg | MEDICATED_PATCH | Freq: Once | TRANSDERMAL | Status: DC
  Administered 2023-10-29: 14 mg via TRANSDERMAL
  Filled 2023-10-29: qty 1

## 2023-10-29 NOTE — ED Notes (Signed)
Phlebotomy and RN unable to get labs at this time

## 2023-10-29 NOTE — ED Notes (Signed)
One belongings bag place in cabinet above 5-8 assignment

## 2023-10-29 NOTE — ED Provider Notes (Signed)
[place] EMERGENCY DEPARTMENT AT [facility] Provider Note   CSN: [id number] Arrival date & time: 10/29/23  1112     History  Chief Complaint  Patient presents with   Psychiatric Evaluation    Barbara Gordon is a 32 y.o. female.  Patient with history of substance abuse, schizophrenia presents today with complaints of psychosocial concerns. Patient has been seen 5 times in the past 48 hours for these same symptoms. Initially patient was seen at [facility], then [facility] twice, and then at [facility]. Upon my evaluation, patient answering questions appropriately. States that nothing has changed from when she was seen at these previous locations, states she still feels like 'something isnt right.' When asked to elaborate further, she states "I just want a nap and a cigarette." She repeatedly denies SI/HI or somatic complaints. Denies use of alcohol or other substances. Denies auditory or visual hallucinations. Denies access to firearms. When asked if she needs anything, she requests a nicotine patch. Denies headaches, weakness, nausea, vomiting, or abdominal pain.   The history is provided by the patient. No language interpreter was used.       Home Medications Prior to Admission medications   Medication Sig Start Date End Date Taking? Authorizing Provider  gabapentin (NEURONTIN) 100 MG capsule Take 3 capsules (300 mg total) by mouth at bedtime. 10/28/23   Mesner, Barbara Cower, MD  ibuprofen (ADVIL) 600 MG tablet Take 600 mg by mouth every 6 (six) hours as needed. 06/21/23   [provider]  OLANZapine (ZYPREXA) 10 MG tablet take 1 tab by mouth at bedtime 04/22/22   Sarita Bottom, MD  promethazine (PHENERGAN) 12.5 MG tablet Take 1 tablet (12.5 mg total) by mouth every 6 (six) hours as needed for nausea or vomiting. Take 1 tablet 30 minutes to 1 hour before taking Mavyret. 08/12/23   [name], RPH-CPP  QUEtiapine (SEROQUEL) 50 MG tablet Take 1 tablet (50  mg total) by mouth at bedtime. 10/28/23   Mesner, Barbara Cower, MD      Allergies    Latex and Tramadol    Review of Systems   Review of Systems  All other systems reviewed and are negative.   Physical Exam Updated Vital Signs BP (!) 147/110 (BP Location: Left Arm)   Pulse (!) 108   Temp 98.5 F (36.9 C) (Oral)   Resp 14   LMP  (LMP Unknown)   SpO2 98%  Physical Exam Vitals and nursing note reviewed.  Constitutional:      General: She is not in acute distress.    Appearance: Normal appearance. She is normal weight. She is not ill-appearing, toxic-appearing or diaphoretic.  HENT:     Head: Normocephalic and atraumatic.  Eyes:     Extraocular Movements: Extraocular movements intact.     Pupils: Pupils are equal, round, and reactive to light.  Cardiovascular:     Rate and Rhythm: Normal rate.  Pulmonary:     Effort: Pulmonary effort is normal. No respiratory distress.  Abdominal:     General: Abdomen is flat.     Palpations: Abdomen is soft.     Tenderness: There is no abdominal tenderness.  Musculoskeletal:        General: Normal range of motion.     Cervical back: Normal range of motion.  Skin:    General: Skin is warm and dry.  Neurological:     General: No focal deficit present.     Mental Status: She is alert and oriented  to person, place, and time.     Comments: Ambulatory with steady gait  Psychiatric:        Mood and Affect: Mood normal.        Behavior: Behavior normal.     Comments: Calm and cooperative, does not appear to be responding to internal stimuli     ED Results / Procedures / Treatments   Labs (all labs ordered are listed, but only abnormal results are displayed) Labs Reviewed  COMPREHENSIVE METABOLIC PANEL  ETHANOL  SALICYLATE LEVEL  ACETAMINOPHEN LEVEL  CBC  RAPID URINE DRUG SCREEN, HOSP PERFORMED  HCG, SERUM, QUALITATIVE    EKG None  Radiology No results found.  Procedures Procedures    Medications Ordered in ED Medications -  No data to display  ED Course/ Medical Decision Making/ A&P                                 Medical Decision Making Amount and/or Complexity of Data Reviewed Labs: ordered.   This patient is a 32 y.o. female  who presents to the ED for concern of psychiatric evaluation.   Differential diagnoses prior to evaluation: The emergent differential diagnosis includes, but is not limited to,  psychosis, malingering . This is not an exhaustive differential.   Past Medical History / Co-morbidities / Social History:  has a past medical history of Hypothyroidism, Polysubstance abuse (HCC), and Schizophrenia (HCC).  Additional history: Chart reviewed. Pertinent results include: 4 visits in the past 72 hours for same symptoms. Has been cleared by psych twice. Also medically cleared each time with benign screening labs  Physical Exam: Physical exam performed. The pertinent findings include: alert and oriented, answering questions, ambulatory with steady gait.  Calm and cooperative and does not appear to be responding to internal stimuli   Disposition: After consideration of the diagnostic results and the patients response to treatment, I feel that emergency department workup does not suggest an emergent condition requiring admission or immediate intervention beyond what has been performed at this time. The plan is: discharge with resources and close outpatient follow-up with return precautions. Considered repeat screening labs, however according to nursing staff patient got up and was walking around at the main entrance asking visitors for cigarettes. She was brought back to the ER by security given concern for psychiatric complaint, however given this is now her 5th visit in 3 days with no acute findings or changes in her condition, no indication for additional evaluation at this time. Patient is requesting to leave so she can go smoke a cigarette. Given resources for follow-up. Evaluation and diagnostic  testing in the emergency department does not suggest an emergent condition requiring admission or immediate intervention beyond what has been performed at this time.  Plan for discharge with close PCP follow-up.  Patient is understanding and amenable with plan, educated on red flag symptoms that would prompt immediate return.  Patient discharged in stable condition.  Final Clinical Impression(s) / ED Diagnoses Final diagnoses:  Psychosocial problem    Rx / DC Orders ED Discharge Orders     None     An After Visit Summary was printed and given to the patient.     Vear Clock 10/29/23 1516    Bethann Berkshire, MD 10/31/23 1054

## 2023-10-29 NOTE — ED Notes (Signed)
Pt eloped- unable to be found in the department. Security was able to locate pt walking towards main entrance of hospital. Provider notified, states she will d/c patient

## 2023-10-29 NOTE — ED Notes (Signed)
Pt Belongings 6 Bracelets, 1 ring, black coat, black jeans, bra, grey sneakers, grey shirt

## 2023-10-29 NOTE — ED Triage Notes (Signed)
Patient bib EMS  Patient dropped off at fire dept by friend Ems reports patient confused, not able to answer questions Patient keep saying something doesn't feel right, paranoid H/o schizophrenia VS w EMS 148/110 hr 106 rr 18 02 98 cbg 128 Patient unable to provide many answers in triage Keeps saying she isnt sure Says she wants to harm self, then says she doesn't Patient reports she has cut self in past  Says she is in pain but unsure where

## 2023-10-29 NOTE — Discharge Instructions (Addendum)
As we discussed, your labs have been normal in the past visits you have had recently. I have given you resources to reach out to for follow-up.   Return if development of any new or worsening symptoms

## 2023-11-04 ENCOUNTER — Ambulatory Visit (HOSPITAL_COMMUNITY)
Admission: EM | Admit: 2023-11-04 | Discharge: 2023-11-04 | Disposition: A | Discharge status: Other Acute Inpatient Hospital | Payer: MEDICAID | Source: Home / Self Care

## 2023-11-04 ENCOUNTER — Encounter (HOSPITAL_COMMUNITY): Payer: Self-pay | Admitting: Emergency Medicine

## 2023-11-04 ENCOUNTER — Inpatient Hospital Stay (HOSPITAL_COMMUNITY)
Admission: EM | Admit: 2023-11-04 | Discharge: 2023-11-07 | DRG: 918 | Disposition: A | Discharge status: Other Acute Inpatient Hospital | Payer: MEDICAID | Source: Ambulatory Visit | Attending: Family Medicine | Admitting: Family Medicine

## 2023-11-04 DIAGNOSIS — F332 Major depressive disorder, recurrent severe without psychotic features: Secondary | ICD-10-CM | POA: Diagnosis present

## 2023-11-04 DIAGNOSIS — F603 Borderline personality disorder: Secondary | ICD-10-CM | POA: Insufficient documentation

## 2023-11-04 DIAGNOSIS — F1590 Other stimulant use, unspecified, uncomplicated: Secondary | ICD-10-CM | POA: Insufficient documentation

## 2023-11-04 DIAGNOSIS — Z79899 Other long term (current) drug therapy: Secondary | ICD-10-CM | POA: Insufficient documentation

## 2023-11-04 DIAGNOSIS — B182 Chronic viral hepatitis C: Secondary | ICD-10-CM | POA: Diagnosis present

## 2023-11-04 DIAGNOSIS — F1290 Cannabis use, unspecified, uncomplicated: Secondary | ICD-10-CM | POA: Insufficient documentation

## 2023-11-04 DIAGNOSIS — F1490 Cocaine use, unspecified, uncomplicated: Secondary | ICD-10-CM | POA: Insufficient documentation

## 2023-11-04 DIAGNOSIS — F1190 Opioid use, unspecified, uncomplicated: Secondary | ICD-10-CM | POA: Insufficient documentation

## 2023-11-04 DIAGNOSIS — F209 Schizophrenia, unspecified: Secondary | ICD-10-CM | POA: Insufficient documentation

## 2023-11-04 DIAGNOSIS — Z91148 Patient's other noncompliance with medication regimen for other reason: Secondary | ICD-10-CM

## 2023-11-04 DIAGNOSIS — F200 Paranoid schizophrenia: Secondary | ICD-10-CM | POA: Diagnosis present

## 2023-11-04 DIAGNOSIS — T50901A Poisoning by unspecified drugs, medicaments and biological substances, accidental (unintentional), initial encounter: Principal | ICD-10-CM | POA: Diagnosis present

## 2023-11-04 DIAGNOSIS — Z885 Allergy status to narcotic agent status: Secondary | ICD-10-CM

## 2023-11-04 DIAGNOSIS — Z9151 Personal history of suicidal behavior: Secondary | ICD-10-CM

## 2023-11-04 DIAGNOSIS — T426X2A Poisoning by other antiepileptic and sedative-hypnotic drugs, intentional self-harm, initial encounter: Principal | ICD-10-CM | POA: Diagnosis present

## 2023-11-04 DIAGNOSIS — R569 Unspecified convulsions: Secondary | ICD-10-CM | POA: Insufficient documentation

## 2023-11-04 DIAGNOSIS — F12188 Cannabis abuse with other cannabis-induced disorder: Secondary | ICD-10-CM | POA: Diagnosis present

## 2023-11-04 DIAGNOSIS — R946 Abnormal results of thyroid function studies: Secondary | ICD-10-CM | POA: Diagnosis present

## 2023-11-04 DIAGNOSIS — Z560 Unemployment, unspecified: Secondary | ICD-10-CM

## 2023-11-04 DIAGNOSIS — E039 Hypothyroidism, unspecified: Secondary | ICD-10-CM | POA: Diagnosis present

## 2023-11-04 DIAGNOSIS — G4700 Insomnia, unspecified: Secondary | ICD-10-CM | POA: Diagnosis present

## 2023-11-04 DIAGNOSIS — R45851 Suicidal ideations: Secondary | ICD-10-CM | POA: Insufficient documentation

## 2023-11-04 DIAGNOSIS — R4587 Impulsiveness: Secondary | ICD-10-CM | POA: Diagnosis present

## 2023-11-04 DIAGNOSIS — G40909 Epilepsy, unspecified, not intractable, without status epilepticus: Secondary | ICD-10-CM | POA: Diagnosis present

## 2023-11-04 DIAGNOSIS — Z91040 Latex allergy status: Secondary | ICD-10-CM

## 2023-11-04 DIAGNOSIS — F17210 Nicotine dependence, cigarettes, uncomplicated: Secondary | ICD-10-CM | POA: Diagnosis present

## 2023-11-04 LAB — COMPREHENSIVE METABOLIC PANEL
ALT: 16 U/L (ref 0–44)
AST: 17 U/L (ref 15–41)
Albumin: 4.8 g/dL (ref 3.5–5.0)
Alkaline Phosphatase: 50 U/L (ref 38–126)
Anion gap: 14 (ref 5–15)
BUN: 6 mg/dL (ref 6–20)
CO2: 19 mmol/L — ABNORMAL LOW (ref 22–32)
Calcium: 9.8 mg/dL (ref 8.9–10.3)
Chloride: 102 mmol/L (ref 98–111)
Creatinine, Ser: 0.79 mg/dL (ref 0.44–1.00)
GFR, Estimated: >60 mL/min (ref >60)
Glucose, Bld: 111 mg/dL — ABNORMAL HIGH (ref 70–99)
Potassium: 3.1 mmol/L — ABNORMAL LOW (ref 3.5–5.1)
Sodium: 135 mmol/L (ref 135–145)
Total Bilirubin: 1 mg/dL (ref <1.2)
Total Protein: 7.8 g/dL (ref 6.5–8.1)

## 2023-11-04 LAB — CBC
HCT: 46.9 % — ABNORMAL HIGH (ref 36.0–46.0)
Hemoglobin: 15.7 g/dL — ABNORMAL HIGH (ref 12.0–15.0)
MCH: 31.1 pg (ref 26.0–34.0)
MCHC: 33.5 g/dL (ref 30.0–36.0)
MCV: 92.9 fL (ref 80.0–100.0)
Platelets: 314 10*3/uL (ref 150–400)
RBC: 5.05 MIL/uL (ref 3.87–5.11)
RDW: 12.7 % (ref 11.5–15.5)
WBC: 9.4 10*3/uL (ref 4.0–10.5)
nRBC: 0 % (ref 0.0–0.2)

## 2023-11-04 LAB — RAPID URINE DRUG SCREEN, HOSP PERFORMED
Amphetamines: NOT DETECTED
Barbiturates: NOT DETECTED
Benzodiazepines: NOT DETECTED
Cocaine: NOT DETECTED
Opiates: NOT DETECTED
Tetrahydrocannabinol: POSITIVE — AB

## 2023-11-04 LAB — HCG, SERUM, QUALITATIVE: Preg, Serum: NEGATIVE

## 2023-11-04 LAB — ETHANOL: Alcohol, Ethyl (B): <10 mg/dL (ref <10)

## 2023-11-04 LAB — CBG MONITORING, ED: Glucose-Capillary: 110 mg/dL — ABNORMAL HIGH (ref 70–99)

## 2023-11-04 LAB — SALICYLATE LEVEL: Salicylate Lvl: <7 mg/dL — ABNORMAL LOW (ref 7.0–30.0)

## 2023-11-04 LAB — ACETAMINOPHEN LEVEL: Acetaminophen (Tylenol), Serum: <10 ug/mL — ABNORMAL LOW (ref 10–30)

## 2023-11-04 NOTE — ED Notes (Signed)
[employer] and [facility] called for transport

## 2023-11-04 NOTE — ED Provider Notes (Cosign Needed)
Behavioral Health Urgent Care Medical Screening Exam  Patient Name: Barbara Gordon MRN: [id number] Date of Evaluation: 11/04/23 Chief Complaint:   Diagnosis:  Final diagnoses:  Schizophrenia, unspecified type (HCC)    History of Present illness: Barbara Gordon is a 32 y.o. female with psychiatric history of  amphetamine use d/o, cannabis use d/o, opioid use d/o, Cocaine use disorder, Borderline personality, and schizophrenia. Patient was brought to [facility] involuntarily by law enforcement for the evaluation of bizarre behaviors and ingestion of unknow amount of Seroquel 50 mg tablets and Gabapentin 100 mg capsules.   Per IVC petition: Respondent has taken pills in excess that were prescribed to her for seizures.  She has taking 22 pills in 5 days.  She is supposed to take 1 per day.  When taking the pills, the respondent says, "let me die."  Respondent has random outburst, saying things like, right "why are you cutting off my toes?"  "Why are you taking my dad's things?"  Why are you hurting my son?"  (Respondents father and son do not live near her) respondent has recently been acting very childlike.  Respondent is not tending to personal hygiene.  Patient was evaluated face-to-face and her chart was reviewed by this nurse practitioner.  On assessment, patient is oriented to self/place/time, and disoriented to situation. Patient is speaking in a low tone and her speech is slightly garble. Patient reports she was brought to [facility] due to "not feeling good." She was unable to elaborate further. She was noted to be whispering to herself and staring into space. She was unable to provide any tangible answer to assessment questions. She repeatedly responded "not sure" to questions. She is easily distracted and appeared to be responding to internal stimuli. When asked about suicidal ideation, patient responded in a loud voice "I guess I want to die" she then cried for a short time and started staring into  space. She reports feeling anxious due to "people are watching me when I sleep." She denies hallucination even though she is noted to be responding to stimuli. She admits to smoking unknown amount of marijuana daily. She denies all other substance abuse.    This Clinical research associate contacted IVC petitioner Barbara Gordon [phone]) for collateral information: [name] reports patient has been acting bizarre and making strange comments for the past 1 week.  She reports patient was prescribed Seroquel 50 mg/night and gabapentin 300 mg/night on 10/29/2023.  She reports she noticed patient was taking these medications incorrectly 2 days ago and she took the medications away from patient. She says she accidentally left the medications unattended and patient ingested excess amount of both medications today. She is unsure of the amount to pills patient ingested. This writer instructed [name] to count each pill bottle. She reports there are 29 out of 60 Gabapentin capsules left and 8 out of 30 Seroquel tablets left.   Flowsheet Row ED from 11/04/2023 in [facility] Emergency Department at [facility] Most recent reading at 11/04/2023 10:27 PM ED from 11/04/2023 in [facility] Most recent reading at 11/04/2023  8:17 PM ED from 10/29/2023 in [facility] Emergency Department at [facility] Most recent reading at 10/29/2023 11:31 AM  C-SSRS RISK CATEGORY Error: Question 6 not populated Low Risk High Risk       Psychiatric Specialty Exam  Presentation  General Appearance:Appropriate for Environment  Eye Contact:Fleeting  Speech:Garbled  Speech Volume:Decreased  Handedness:Right   Mood and Affect  Mood: Anxious;  Irritable  Affect: Flat   Thought Process  Thought Processes: Irrevelant  Descriptions of Associations:Loose  Orientation:Full (Time, Place and Person)  Thought Content:Paranoid Ideation  Diagnosis of Schizophrenia or  Schizoaffective disorder in past: Yes  Duration of Psychotic Symptoms: Greater than six months  Hallucinations:None  Ideas of Reference:Paranoia  Suicidal Thoughts:Yes, Passive  Homicidal Thoughts:No   Sensorium  Memory: Immediate Poor; Remote Fair; Recent Poor  Judgment: Impaired  Insight: Lacking   Executive Functions  Concentration: Poor  Attention Span: Poor  Recall: Poor  Fund of Knowledge: Poor  Language: Poor   Psychomotor Activity  Psychomotor Activity: Normal   Assets  Assets: Housing; Social Support   Sleep  Sleep: -- (unable to answer)  Number of hours:  6   Physical Exam: Physical Exam Vitals reviewed.  Constitutional:      General: She is not in acute distress.    Appearance: She is well-developed. She is not ill-appearing.  HENT:     Head: Normocephalic and atraumatic.  Eyes:     Conjunctiva/sclera: Conjunctivae normal.  Cardiovascular:     Rate and Rhythm: Normal rate.  Pulmonary:     Effort: Pulmonary effort is normal.  Abdominal:     Palpations: Abdomen is soft.     Tenderness: There is no abdominal tenderness.  Musculoskeletal:        General: Normal range of motion.     Cervical back: Normal range of motion and neck supple.  Skin:    General: Skin is warm.     Capillary Refill: Capillary refill takes less than 2 seconds.  Neurological:     Mental Status: She is alert. She is disoriented.  Psychiatric:        Attention and Perception: Attention and perception normal.        Mood and Affect: Mood is anxious.        Speech: Speech is slurred.        Behavior: Behavior is cooperative.        Thought Content: Thought content includes suicidal ideation. Thought content does not include suicidal plan.    Review of Systems  Constitutional: Negative.   HENT: Negative.    Eyes: Negative.   Respiratory: Negative.    Cardiovascular: Negative.   Gastrointestinal: Negative.   Genitourinary: Negative.    Musculoskeletal: Negative.   Skin: Negative.   Neurological: Negative.   Endo/Heme/Allergies: Negative.   Psychiatric/Behavioral:  Positive for substance abuse and suicidal ideas. The patient is nervous/anxious.    Blood pressure 127/88, pulse 98, temperature 98.2 F (36.8 C), resp. rate 18, SpO2 98%. There is no height or weight on file to calculate BMI.  Musculoskeletal: Strength & Muscle Tone: within normal limits Gait & Station: normal Patient leans: Right   [facility] MSE Discharge Disposition for Follow up and Recommendations: Based on my evaluation the patient appears to have an emergency medical condition for which I recommend the patient be transferred to the emergency department for further evaluation.  Patient will be transfered to [facility] for medical clearance due to ingested unknown amount of Gabapentin 100mg  capsules and unknown amount of Seroquel 50 mg tablets. Patient will need inpatient psychiatric treatment post medical clearance.   Maricela Bo, NP 11/04/2023, 11:05 PM

## 2023-11-04 NOTE — ED Notes (Signed)
[employer] came but not [facility] called non emergency number back and they said thye do not have [facility] or EMS assigned to pt at this. AC was informed of delay

## 2023-11-04 NOTE — ED Notes (Signed)
EMS stated they are rerouting pt to  due to her overdosing and it being [facility], writer called [facility] and told charge nurse Raoul Pitch pt will not be coming and called and gave report to Victorino Dike and explained the situation she said thank you for letting her know that
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
## 2023-11-04 NOTE — ED Triage Notes (Signed)
PT BIB EMS and police from [facility] under IVC. Pt has conflicting stories. Pt stated to [facility] that she took 20 pills over past 5 days. Unknown what medication she took. Pt told EMS that she did not take any extra medication only what she is prescribed. Pt denies taking any medication other that what is prescribed. Pt appears paranoid looking around the room during triage. Pt randomly spoke out and said "what are yall doing to me in my sleep" PT denies wanting to harm herself or anyone else. Pt states I just don't feel right. When asking what she is feeling she is unable to explain. Pt denies pain. Pt knows that she takes gabapentin and seroquel and denies taking any more than what is prescribed.

## 2023-11-04 NOTE — ED Provider Notes (Incomplete)
[place] EMERGENCY DEPARTMENT AT [facility] Provider Note   CSN: [id number] Arrival date & time: 11/04/23  2212     History {Add pertinent medical, surgical, social history, OB history to HPI:1} Chief Complaint  Patient presents with  . Paranoid    Barbara Gordon is a 32 y.o. female.  Patient presents to the emergency department via EMS from the behavioral health urgent care under involuntary commitment.  Patient reported to the behavioral health urgent care that she took 22 "seizure" pills over the past 5 days when she is prescribed 1 pill daily.  Chart review shows Seroquel nightly and gabapentin nightly.  During my assessment patient states she took 1 extra dose of an unknown medication.  Patient was sent here for overdose evaluation.  Patient currently is asymptomatic and unable to voice any medical complaints.  She does deny SI, HI, AVH.  Medical history significant for polysubstance abuse, substance abuse disorder, suicidal ideation, cannabis use with psychotic disorder, seizure history, schizophrenia, paranoid, chronic  HPI     Home Medications Prior to Admission medications   Medication Sig Start Date End Date Taking? Authorizing Provider  gabapentin (NEURONTIN) 100 MG capsule Take 3 capsules (300 mg total) by mouth at bedtime. Patient not taking: Reported on 10/29/2023 10/28/23   Mesner, Barbara Cower, MD  QUEtiapine (SEROQUEL) 50 MG tablet Take 1 tablet (50 mg total) by mouth at bedtime. Patient not taking: Reported on 10/29/2023 10/28/23   Mesner, Barbara Cower, MD      Allergies    Latex and Tramadol    Review of Systems   Review of Systems  Physical Exam Updated Vital Signs BP (!) 132/106 (BP Location: Right Arm)   Pulse (!) 104   Temp 98.3 F (36.8 C)   Resp 18   LMP 11/04/2023 (Exact Date)   SpO2 98%  Physical Exam Vitals and nursing note reviewed.  Constitutional:      General: She is not in acute distress.    Appearance: She is well-developed.  HENT:      Head: Normocephalic and atraumatic.  Eyes:     Conjunctiva/sclera: Conjunctivae normal.  Cardiovascular:     Rate and Rhythm: Normal rate and regular rhythm.     Heart sounds: No murmur heard. Pulmonary:     Effort: Pulmonary effort is normal. No respiratory distress.     Breath sounds: Normal breath sounds.  Abdominal:     Palpations: Abdomen is soft.     Tenderness: There is no abdominal tenderness.  Musculoskeletal:        General: No swelling.     Cervical back: Neck supple.  Skin:    General: Skin is warm and dry.     Capillary Refill: Capillary refill takes less than 2 seconds.  Neurological:     Mental Status: She is alert.     ED Results / Procedures / Treatments   Labs (all labs ordered are listed, but only abnormal results are displayed) Labs Reviewed  SALICYLATE LEVEL - Abnormal; Notable for the following components:      Result Value   Salicylate Lvl <7.0 (*)    All other components within normal limits  ACETAMINOPHEN LEVEL - Abnormal; Notable for the following components:   Acetaminophen (Tylenol), Serum <10 (*)    All other components within normal limits  CBC - Abnormal; Notable for the following components:   Hemoglobin 15.7 (*)    HCT 46.9 (*)    All other components within normal limits  RAPID URINE DRUG  SCREEN, HOSP PERFORMED - Abnormal; Notable for the following components:   Tetrahydrocannabinol POSITIVE (*)    All other components within normal limits  COMPREHENSIVE METABOLIC PANEL - Abnormal; Notable for the following components:   Potassium 3.1 (*)    CO2 19 (*)    Glucose, Bld 111 (*)    All other components within normal limits  CBG MONITORING, ED - Abnormal; Notable for the following components:   Glucose-Capillary 110 (*)    All other components within normal limits  ETHANOL  HCG, SERUM, QUALITATIVE    EKG None  Radiology No results found.  Procedures Procedures  {Document cardiac monitor, telemetry assessment procedure  when appropriate:1}  Medications Ordered in ED Medications - No data to display  ED Course/ Medical Decision Making/ A&P   {   Click here for ABCD2, HEART and other calculatorsREFRESH Note before signing :1}                              Medical Decision Making Amount and/or Complexity of Data Reviewed Labs: ordered.   This patient presents to the ED for concern of possible overdose, this involves an extensive number of treatment options, and is a complaint that carries with it a high risk of complications and morbidity.     Co morbidities that complicate the patient evaluation  History of SI, substance abuse disorders   Additional history obtained:  Additional history obtained from behavioral health urgent care External records from outside source obtained and reviewed including behavioral health notes   Lab Tests:  I Ordered, and personally interpreted labs.  The pertinent results include:  UDS positive for THC, potassium 3.1   Cardiac Monitoring: / EKG:  The patient was maintained on a cardiac monitor.  I personally viewed and interpreted the cardiac monitored which showed an underlying rhythm of: Sinus rhythm   Problem List / ED Course / Critical interventions / Medication management   I ordered medication including potassium for mild hypokalemia Reevaluation of the patient after these medicines showed that the patient improved I have reviewed the patients home medicines and have made adjustments as needed   Social Determinants of Health:  Patient is a daily tobacco smoker   Test / Admission - Considered:  Poison control was contacted by Charity fundraiser.  Poison control recommends 24-hour medical observation due to ingestion of unknown substance and unknown amount.   {Document critical care time when appropriate:1} {Document review of labs and clinical decision tools ie heart score, Chads2Vasc2 etc:1}  {Document your independent review of radiology images, and any  outside records:1} {Document your discussion with family members, caretakers, and with consultants:1} {Document social determinants of health affecting pt's care:1} {Document your decision making why or why not admission, treatments were needed:1} Final Clinical Impression(s) / ED Diagnoses Final diagnoses:  None    Rx / DC Orders ED Discharge Orders     None

## 2023-11-04 NOTE — ED Notes (Signed)
Per poison control d/t unknown ingestion pt should be a 24 hour medical hold

## 2023-11-04 NOTE — ED Notes (Signed)
Report was given to [facility]  ED

## 2023-11-04 NOTE — ED Notes (Addendum)
EMS/[employer] dispatch contacted and states pt is on the pick up list, but no one available at this immediate time.  NP Ene Ajibola updated on status of pt.  Pt remains IVC, A&O x 4 , no distress noted, non labored respirations, skin color good.  Pt being monitored from hallway.

## 2023-11-04 NOTE — ED Provider Notes (Signed)
[place] EMERGENCY DEPARTMENT AT [facility] Provider Note   CSN: [id number] Arrival date & time: 11/04/23  2212     History  Chief Complaint  Patient presents with   Paranoid    Barbara Gordon is a 32 y.o. female.  Patient presents to the emergency department via EMS from the behavioral health urgent care under involuntary commitment.  Patient reported to the behavioral health urgent care that she took 22 "seizure" pills over the past 5 days when she is prescribed 1 pill daily.  Chart review shows Seroquel nightly and gabapentin nightly.  During my assessment patient states she took 1 extra dose of an unknown medication.  Patient was sent here for overdose evaluation.  Patient currently is asymptomatic and unable to voice any medical complaints.  She does deny SI, HI, AVH.  Medical history significant for polysubstance abuse, substance abuse disorder, suicidal ideation, cannabis use with psychotic disorder, seizure history, schizophrenia, paranoid, chronic  HPI     Home Medications Prior to Admission medications   Medication Sig Start Date End Date Taking? Authorizing Provider  gabapentin (NEURONTIN) 100 MG capsule Take 3 capsules (300 mg total) by mouth at bedtime. Patient not taking: Reported on 10/29/2023 10/28/23   Mesner, Barbara Cower, MD  QUEtiapine (SEROQUEL) 50 MG tablet Take 1 tablet (50 mg total) by mouth at bedtime. Patient not taking: Reported on 10/29/2023 10/28/23   Mesner, Barbara Cower, MD      Allergies    Latex and Tramadol    Review of Systems   Review of Systems  Physical Exam Updated Vital Signs BP (!) 132/106 (BP Location: Right Arm)   Pulse (!) 104   Temp 98.3 F (36.8 C)   Resp 18   LMP 11/04/2023 (Exact Date)   SpO2 98%  Physical Exam Vitals and nursing note reviewed.  Constitutional:      General: She is not in acute distress.    Appearance: She is well-developed.  HENT:     Head: Normocephalic and atraumatic.  Eyes:      Conjunctiva/sclera: Conjunctivae normal.  Cardiovascular:     Rate and Rhythm: Normal rate and regular rhythm.     Heart sounds: No murmur heard. Pulmonary:     Effort: Pulmonary effort is normal. No respiratory distress.     Breath sounds: Normal breath sounds.  Abdominal:     Palpations: Abdomen is soft.     Tenderness: There is no abdominal tenderness.  Musculoskeletal:        General: No swelling.     Cervical back: Neck supple.  Skin:    General: Skin is warm and dry.     Capillary Refill: Capillary refill takes less than 2 seconds.  Neurological:     Mental Status: She is alert.     ED Results / Procedures / Treatments   Labs (all labs ordered are listed, but only abnormal results are displayed) Labs Reviewed  SALICYLATE LEVEL - Abnormal; Notable for the following components:      Result Value   Salicylate Lvl <7.0 (*)    All other components within normal limits  ACETAMINOPHEN LEVEL - Abnormal; Notable for the following components:   Acetaminophen (Tylenol), Serum <10 (*)    All other components within normal limits  CBC - Abnormal; Notable for the following components:   Hemoglobin 15.7 (*)    HCT 46.9 (*)    All other components within normal limits  RAPID URINE DRUG SCREEN, HOSP PERFORMED - Abnormal; Notable for the following  components:   Tetrahydrocannabinol POSITIVE (*)    All other components within normal limits  COMPREHENSIVE METABOLIC PANEL - Abnormal; Notable for the following components:   Potassium 3.1 (*)    CO2 19 (*)    Glucose, Bld 111 (*)    All other components within normal limits  CBG MONITORING, ED - Abnormal; Notable for the following components:   Glucose-Capillary 110 (*)    All other components within normal limits  ETHANOL  HCG, SERUM, QUALITATIVE    EKG EKG Interpretation Date/Time:  Friday November 04 2023 22:52:31 EST Ventricular Rate:  78 PR Interval:  150 QRS Duration:  82 QT Interval:  384 QTC Calculation: 438 R  Axis:   84  Text Interpretation: Sinus rhythm Baseline wander in lead(s) V1 Confirmed by Kennis Carina [phone] on 11/05/2023 1:34:22 AM  Radiology No results found.  Procedures Procedures    Medications Ordered in ED Medications  melatonin tablet 5 mg (has no administration in time range)  potassium chloride SA (KLOR-CON M) CR tablet 40 mEq (40 mEq Oral Given 11/05/23 0130)    ED Course/ Medical Decision Making/ A&P                                 Medical Decision Making Amount and/or Complexity of Data Reviewed Labs: ordered.   This patient presents to the ED for concern of possible overdose, this involves an extensive number of treatment options, and is a complaint that carries with it a high risk of complications and morbidity.     Co morbidities that complicate the patient evaluation  History of SI, substance abuse disorders   Additional history obtained:  Additional history obtained from behavioral health urgent care External records from outside source obtained and reviewed including behavioral health notes   Lab Tests:  I Ordered, and personally interpreted labs.  The pertinent results include:  UDS positive for THC, potassium 3.1   Cardiac Monitoring: / EKG:  The patient was maintained on a cardiac monitor.  I personally viewed and interpreted the cardiac monitored which showed an underlying rhythm of: Sinus rhythm   Problem List / ED Course / Critical interventions / Medication management   I ordered medication including potassium for mild hypokalemia Reevaluation of the patient after these medicines showed that the patient improved I have reviewed the patients home medicines and have made adjustments as needed   Social Determinants of Health:  Patient is a daily tobacco smoker  Consultations:         I requested consultation with the hospitalist. Dr.Thomas agrees to see patient for admission.   Test / Admission - Considered:  Poison  control was contacted by RN.  Poison control recommends 24-hour medical observation due to ingestion of unknown substance and unknown amount.         Final Clinical Impression(s) / ED Diagnoses Final diagnoses:  Overdose by ingestion    Rx / DC Orders ED Discharge Orders     None         Pamala Duffel 11/05/23 0222    Sabas Sous, MD 11/05/23 [phone]

## 2023-11-05 ENCOUNTER — Other Ambulatory Visit: Payer: Self-pay

## 2023-11-05 DIAGNOSIS — F17210 Nicotine dependence, cigarettes, uncomplicated: Secondary | ICD-10-CM | POA: Diagnosis present

## 2023-11-05 DIAGNOSIS — Z9151 Personal history of suicidal behavior: Secondary | ICD-10-CM | POA: Diagnosis not present

## 2023-11-05 DIAGNOSIS — F201 Disorganized schizophrenia: Secondary | ICD-10-CM | POA: Diagnosis not present

## 2023-11-05 DIAGNOSIS — F200 Paranoid schizophrenia: Secondary | ICD-10-CM | POA: Diagnosis present

## 2023-11-05 DIAGNOSIS — T50902A Poisoning by unspecified drugs, medicaments and biological substances, intentional self-harm, initial encounter: Secondary | ICD-10-CM

## 2023-11-05 DIAGNOSIS — F332 Major depressive disorder, recurrent severe without psychotic features: Secondary | ICD-10-CM | POA: Diagnosis present

## 2023-11-05 DIAGNOSIS — B182 Chronic viral hepatitis C: Secondary | ICD-10-CM | POA: Diagnosis present

## 2023-11-05 DIAGNOSIS — R946 Abnormal results of thyroid function studies: Secondary | ICD-10-CM | POA: Diagnosis present

## 2023-11-05 DIAGNOSIS — R45851 Suicidal ideations: Secondary | ICD-10-CM | POA: Diagnosis present

## 2023-11-05 DIAGNOSIS — T50901A Poisoning by unspecified drugs, medicaments and biological substances, accidental (unintentional), initial encounter: Secondary | ICD-10-CM | POA: Diagnosis present

## 2023-11-05 DIAGNOSIS — F603 Borderline personality disorder: Secondary | ICD-10-CM | POA: Diagnosis present

## 2023-11-05 DIAGNOSIS — T50902D Poisoning by unspecified drugs, medicaments and biological substances, intentional self-harm, subsequent encounter: Secondary | ICD-10-CM | POA: Diagnosis not present

## 2023-11-05 DIAGNOSIS — Z885 Allergy status to narcotic agent status: Secondary | ICD-10-CM | POA: Diagnosis not present

## 2023-11-05 DIAGNOSIS — F12188 Cannabis abuse with other cannabis-induced disorder: Secondary | ICD-10-CM | POA: Diagnosis present

## 2023-11-05 DIAGNOSIS — Z91148 Patient's other noncompliance with medication regimen for other reason: Secondary | ICD-10-CM | POA: Diagnosis not present

## 2023-11-05 DIAGNOSIS — G40909 Epilepsy, unspecified, not intractable, without status epilepticus: Secondary | ICD-10-CM | POA: Diagnosis present

## 2023-11-05 DIAGNOSIS — G4700 Insomnia, unspecified: Secondary | ICD-10-CM | POA: Diagnosis present

## 2023-11-05 DIAGNOSIS — Z560 Unemployment, unspecified: Secondary | ICD-10-CM | POA: Diagnosis not present

## 2023-11-05 DIAGNOSIS — Z91040 Latex allergy status: Secondary | ICD-10-CM | POA: Diagnosis not present

## 2023-11-05 DIAGNOSIS — R4587 Impulsiveness: Secondary | ICD-10-CM | POA: Diagnosis present

## 2023-11-05 DIAGNOSIS — E039 Hypothyroidism, unspecified: Secondary | ICD-10-CM | POA: Diagnosis present

## 2023-11-05 DIAGNOSIS — T426X2A Poisoning by other antiepileptic and sedative-hypnotic drugs, intentional self-harm, initial encounter: Secondary | ICD-10-CM | POA: Diagnosis present

## 2023-11-05 LAB — CBC WITH DIFFERENTIAL/PLATELET
Abs Immature Granulocytes: 0.02 10*3/uL (ref 0.00–0.07)
Basophils Absolute: 0 10*3/uL (ref 0.0–0.1)
Basophils Relative: 1 %
Eosinophils Absolute: 0.3 10*3/uL (ref 0.0–0.5)
Eosinophils Relative: 4 %
HCT: 41.4 % (ref 36.0–46.0)
Hemoglobin: 14.5 g/dL (ref 12.0–15.0)
Immature Granulocytes: 0 %
Lymphocytes Relative: 31 %
Lymphs Abs: 1.9 10*3/uL (ref 0.7–4.0)
MCH: 31 pg (ref 26.0–34.0)
MCHC: 35 g/dL (ref 30.0–36.0)
MCV: 88.5 fL (ref 80.0–100.0)
Monocytes Absolute: 0.4 10*3/uL (ref 0.1–1.0)
Monocytes Relative: 6 %
Neutro Abs: 3.6 10*3/uL (ref 1.7–7.7)
Neutrophils Relative %: 58 %
Platelets: 317 10*3/uL (ref 150–400)
RBC: 4.68 MIL/uL (ref 3.87–5.11)
RDW: 12.5 % (ref 11.5–15.5)
WBC: 6.1 10*3/uL (ref 4.0–10.5)
nRBC: 0 % (ref 0.0–0.2)

## 2023-11-05 LAB — COMPREHENSIVE METABOLIC PANEL
ALT: 16 U/L (ref 0–44)
AST: 18 U/L (ref 15–41)
Albumin: 4.3 g/dL (ref 3.5–5.0)
Alkaline Phosphatase: 48 U/L (ref 38–126)
Anion gap: 8 (ref 5–15)
BUN: <5 mg/dL — ABNORMAL LOW (ref 6–20)
CO2: 21 mmol/L — ABNORMAL LOW (ref 22–32)
Calcium: 9.2 mg/dL (ref 8.9–10.3)
Chloride: 109 mmol/L (ref 98–111)
Creatinine, Ser: 0.68 mg/dL (ref 0.44–1.00)
GFR, Estimated: >60 mL/min (ref >60)
Glucose, Bld: 108 mg/dL — ABNORMAL HIGH (ref 70–99)
Potassium: 4.3 mmol/L (ref 3.5–5.1)
Sodium: 138 mmol/L (ref 135–145)
Total Bilirubin: 0.8 mg/dL (ref <1.2)
Total Protein: 7.4 g/dL (ref 6.5–8.1)

## 2023-11-05 LAB — ACETAMINOPHEN LEVEL: Acetaminophen (Tylenol), Serum: <10 ug/mL — ABNORMAL LOW (ref 10–30)

## 2023-11-05 LAB — CBG MONITORING, ED: Glucose-Capillary: 97 mg/dL (ref 70–99)

## 2023-11-05 MED ORDER — HEPARIN SODIUM (PORCINE) 5000 UNIT/ML IJ SOLN
5000.0000 [IU] | Freq: Three times a day (TID) | INTRAMUSCULAR | Status: DC
  Administered 2023-11-05 – 2023-11-06 (×4): 5000 [IU] via SUBCUTANEOUS
  Filled 2023-11-05 (×5): qty 1

## 2023-11-05 MED ORDER — POTASSIUM CHLORIDE CRYS ER 20 MEQ PO TBCR
40.0000 meq | EXTENDED_RELEASE_TABLET | Freq: Once | ORAL | Status: AC
  Administered 2023-11-05: 40 meq via ORAL
  Filled 2023-11-05: qty 2

## 2023-11-05 MED ORDER — SODIUM CHLORIDE 0.9 % IV SOLN: INTRAVENOUS | Status: AC

## 2023-11-05 MED ORDER — MELATONIN 5 MG PO TABS
5.0000 mg | ORAL_TABLET | Freq: Every day | ORAL | Status: DC
  Administered 2023-11-05 – 2023-11-06 (×3): 5 mg via ORAL
  Filled 2023-11-05 (×3): qty 1
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
## 2023-11-05 NOTE — ED Notes (Signed)
Provider Dr. Joneen Roach paged by this nurse d/t escalating behaviors of pt. Pt reports to this nurse that "I'm just tired" but very fidgety, restless, and has tears welling up in the corners of her eyes as if she is going to cry, pt also reporting "I just don't feel right" but pt unable to describe what does not feel right about her at this time. Awaiting call back from provider as there are no PRN medications at this time, will continue to verbally reassure and redirect pt.

## 2023-11-05 NOTE — H&P (Signed)
History and Physical    Barbara Gordon QIO:[id number] DOB: [birth year] DOA: 11/04/2023  PCP: Patient, No Pcp Per  Patient coming from: [facility]  I have personally briefly reviewed patient's old medical records in [facility]  Chief Complaint: overdose / SI  HPI: Barbara Gordon is a 32 y.o. female with medical history significant of hypothyroidism,Schizophrenia,  seizure history on gabapentin, tobacco abuse,malingering,borderline personality d/o, Chronic hepatitis C followed by ID,as well as substance abuse who has  had multiple visits ED since 12/18 due to psych complaints. Patient  presents to ED in referral from psych Urgent care with complaint of intentional ingestion of multiple doses of her home medications. Patient is not clear on what she took or how many.  Patient  is brought in by [employer] under IVC .Patient was also noted at psych intake to have random outburst. Patient currently is calm, she had no specific complaints but states she feels off. She denies sob/chest pain/n/v/d/or dysuria.  She states she does not recall much of what occurred prior to admission to ED. She is not able to give specific details about which pills/ quantity that was taken.   ED Course:  Afeb, bp 127/88, rr 18, sat 98%  EKG: nsr with sinus arrhythmia, no change from prior Labs:  UDS:+ marijuna Wbc:9.4, hgb 15.7, plt314 Na 135, K 3.1, Cl 102, biacrb 19, cr 0.79 Salicylate <7 Tylenol< 10  Tx KCL, melatonin   Review of Systems: As per HPI otherwise 10 point review of systems negative.   Past Medical History:  Diagnosis Date   Hypothyroidism    Polysubstance abuse (HCC)    Schizophrenia (HCC)     History reviewed. No pertinent surgical history.   reports that she has been smoking cigarettes and e-cigarettes. She has never used smokeless tobacco. She reports that she does not currently use alcohol. She reports that she does not currently use drugs after having used the following drugs: Cocaine,  Amphetamines, Methamphetamines, and Marijuana.  Allergies  Allergen Reactions   Latex Itching and Rash   Tramadol Itching and Other (See Comments)    Provider: Crist Fat CFM - Allergy Description: TraMADol HCl *ANALGESICS - OPIOID* CFM - Allergy Annotation: Pruritus.     History reviewed. No pertinent family history.  Prior to Admission medications   Medication Sig Start Date End Date Taking? Authorizing Provider  gabapentin (NEURONTIN) 100 MG capsule Take 3 capsules (300 mg total) by mouth at bedtime. Patient not taking: Reported on 10/29/2023 10/28/23   Mesner, Barbara Cower, MD  QUEtiapine (SEROQUEL) 50 MG tablet Take 1 tablet (50 mg total) by mouth at bedtime. Patient not taking: Reported on 10/29/2023 10/28/23   Marily Memos, MD    Physical Exam: Vitals:   11/04/23 2234 11/05/23 0330 11/05/23 0401  BP: (!) 132/106  126/79  Pulse: (!) 104  88  Resp: 18  (!) 24  Temp: 98.3 F (36.8 C) 98 F (36.7 C)   SpO2: 98%  100%    Constitutional: NAD, calm, comfortable/flat affect Vitals:   11/04/23 2234 11/05/23 0330 11/05/23 0401  BP: (!) 132/106  126/79  Pulse: (!) 104  88  Resp: 18  (!) 24  Temp: 98.3 F (36.8 C) 98 F (36.7 C)   SpO2: 98%  100%   Eyes: PERRL, lids and conjunctivae normal ENMT: Mucous membranes are moist. Posterior pharynx clear of any exudate or lesions.Normal dentition.  Neck: normal, supple, no masses, no thyromegaly Respiratory: clear to auscultation bilaterally, no wheezing, no crackles. Normal respiratory effort.  No accessory muscle use.  Cardiovascular: Regular rate and rhythm, no murmurs / rubs / gallops. No extremity edema. 2+ pedal pulses. No carotid bruits.  Abdomen: no tenderness, no masses palpated. No hepatosplenomegaly. Bowel sounds positive.  Musculoskeletal: no clubbing / cyanosis. No joint deformity upper and lower extremities. Good ROM, no contractures. Normal muscle tone.  Skin: no rashes, lesions, ulcers. No induration Neurologic: CN  2-12 grossly intact. Sensation intact, DTR normal. Strength 5/5 in all 4.  Psychiatric: flat affect, not agitated a Labs on Admission: I have personally reviewed following labs and imaging studies  CBC: Recent Labs  Lab 11/04/23 2230  WBC 9.4  HGB 15.7*  HCT 46.9*  MCV 92.9  PLT 314   Basic Metabolic Panel: Recent Labs  Lab 11/04/23 2247  NA 135  K 3.1*  CL 102  CO2 19*  GLUCOSE 111*  BUN 6  CREATININE 0.79  CALCIUM 9.8   GFR: CrCl cannot be calculated (Unknown ideal weight.). Liver Function Tests: Recent Labs  Lab 11/04/23 2247  AST 17  ALT 16  ALKPHOS 50  BILITOT 1.0  PROT 7.8  ALBUMIN 4.8   No results for input(s): "LIPASE", "AMYLASE" in the last 168 hours. No results for input(s): "AMMONIA" in the last 168 hours. Coagulation Profile: No results for input(s): "INR", "PROTIME" in the last 168 hours. Cardiac Enzymes: No results for input(s): "CKTOTAL", "CKMB", "CKMBINDEX", "TROPONINI" in the last 168 hours. BNP (last 3 results) No results for input(s): "PROBNP" in the last 8760 hours. HbA1C: No results for input(s): "HGBA1C" in the last 72 hours. CBG: Recent Labs  Lab 11/04/23 2257  GLUCAP 110*   Lipid Profile: No results for input(s): "CHOL", "HDL", "LDLCALC", "TRIG", "CHOLHDL", "LDLDIRECT" in the last 72 hours. Thyroid Function Tests: No results for input(s): "TSH", "T4TOTAL", "FREET4", "T3FREE", "THYROIDAB" in the last 72 hours. Anemia Panel: No results for input(s): "VITAMINB12", "FOLATE", "FERRITIN", "TIBC", "IRON", "RETICCTPCT" in the last 72 hours. Urine analysis:    Component Value Date/Time   COLORURINE YELLOW 04/29/2022 2103   APPEARANCEUR CLOUDY (A) 04/29/2022 2103   LABSPEC 1.018 04/29/2022 2103   PHURINE 6.0 04/29/2022 2103   GLUCOSEU NEGATIVE 04/29/2022 2103   HGBUR NEGATIVE 04/29/2022 2103   BILIRUBINUR NEGATIVE 04/29/2022 2103   KETONESUR NEGATIVE 04/29/2022 2103   PROTEINUR NEGATIVE 04/29/2022 2103   UROBILINOGEN 1.0  06/23/2009 1520   NITRITE NEGATIVE 04/29/2022 2103   LEUKOCYTESUR NEGATIVE 04/29/2022 2103    Radiological Exams on Admission: No results found.  EKG: Independently reviewed. See above  Assessment/Plan  Intentional Overdose  Psych d/o decompensation  SI  - patient is admitted for 24 hour medical clearance as recommended by poison control -the quantify and medications taken is uncertain  -currently UDS only noted marijuana  - per poison control recs  monitor tylenol level  as well as EKG And monitor on tele -will need repeat EKG at end of 24 hours observation   Psych d/o Schizophrenia  Borderline personality d/o - place on one to one  -patient on IVC - patient will be discharged to inpatient psych  s/p observation x 24 hours    Hypothyroidism -check tsh   Hx of Seizure d/o  - per med list on gabapentin which patient corroborates  -place on seizure precautions  -resume medication s/p 24 hour observation once cleared by poison control  Chronic Hep C -s/p tx , [facility]  - after post tx labs to assess SVR  Polysubstance abuse  UDS-marijuna Psych/ SW to  folllow   FEN Monitor electrolytes  DVT prophylaxis: heparin Code Status: full/ as discussed per patient wishes in event of cardiac arrest  Family Communication: none at bedside Disposition Plan: patient  expected to be admitted greater than 2 midnights  Consults called: Poison control Admission status: med tele   Lurline Del MD [facility]   If 7PM-7AM, please contact night-coverage [email or web address] Password [facility]  11/05/2023, 4:04 AM

## 2023-11-05 NOTE — Progress Notes (Signed)
    PatientRosland Pilcher WUJ:[id number] DOB: 09-Apr-1991      Brief hospital course: 32 y.o. F with history of schizophrenia, hypothyroidism, hx seizures, smoking and chronic hepatitis C and THC use disorder who presented to [facility] with intentional overdose of "seizure medicines" or "sleeping pills".  Transferred to the ER for medical clearance.    This is a no charge note, for further details, please see the H&P by my partner, Dr. Maisie Fus from earlier today.   Principal Problem:   Overdose    Suspected gabapentin overdose ECG remained normal Asymptomatic  Will be cleared technically at 8:30PM tonight if repeat ECG tonight is normal and she remains asymptomatic.  Will order repeat ECG for AM.       Physical Exam: BP 111/78   Pulse 72   Temp (!) 97.1 F (36.2 C)   Resp (!) 21   LMP 11/04/2023 (Exact Date)   SpO2 100%   Patient seen and examined.      Family Communication:          Author: Alberteen Sam, MD 11/05/2023 5:38 PM

## 2023-11-05 NOTE — ED Notes (Signed)
Pt is IVC'd with intermittent moments of agitation/aggression and will not remain seated to remain on monitor

## 2023-11-05 NOTE — Hospital Course (Signed)
32 y.o. F with history of schizophrenia, suicide attempt, polysubstance abuse, TBI/seizure, smoking and chronic hepatitis C and THC use disorder who presented to [facility] with intentional overdose of "seizure medicines" or "sleeping pills".  Transferred to the ER for medical clearance.

## 2023-11-05 NOTE — ED Notes (Signed)
IVC paperwork complete and in orange zone, expires 11/11/23, envelope # [id number] (waiting on case # from clerk of court)

## 2023-11-06 DIAGNOSIS — T50902A Poisoning by unspecified drugs, medicaments and biological substances, intentional self-harm, initial encounter: Secondary | ICD-10-CM | POA: Diagnosis not present

## 2023-11-06 LAB — PROTIME-INR
INR: 1 (ref 0.8–1.2)
Prothrombin Time: 13 s (ref 11.4–15.2)

## 2023-11-06 LAB — COMPREHENSIVE METABOLIC PANEL
ALT: 16 U/L (ref 0–44)
AST: 20 U/L (ref 15–41)
Albumin: 4.6 g/dL (ref 3.5–5.0)
Alkaline Phosphatase: 51 U/L (ref 38–126)
Anion gap: 12 (ref 5–15)
BUN: 9 mg/dL (ref 6–20)
CO2: 18 mmol/L — ABNORMAL LOW (ref 22–32)
Calcium: 9.5 mg/dL (ref 8.9–10.3)
Chloride: 106 mmol/L (ref 98–111)
Creatinine, Ser: 0.83 mg/dL (ref 0.44–1.00)
GFR, Estimated: >60 mL/min (ref >60)
Glucose, Bld: 127 mg/dL — ABNORMAL HIGH (ref 70–99)
Potassium: 3.7 mmol/L (ref 3.5–5.1)
Sodium: 136 mmol/L (ref 135–145)
Total Bilirubin: 0.6 mg/dL (ref <1.2)
Total Protein: 7.6 g/dL (ref 6.5–8.1)

## 2023-11-06 LAB — CBC
HCT: 42.4 % (ref 36.0–46.0)
Hemoglobin: 15.1 g/dL — ABNORMAL HIGH (ref 12.0–15.0)
MCH: 31.5 pg (ref 26.0–34.0)
MCHC: 35.6 g/dL (ref 30.0–36.0)
MCV: 88.3 fL (ref 80.0–100.0)
Platelets: 345 10*3/uL (ref 150–400)
RBC: 4.8 MIL/uL (ref 3.87–5.11)
RDW: 12.4 % (ref 11.5–15.5)
WBC: 7.9 10*3/uL (ref 4.0–10.5)
nRBC: 0 % (ref 0.0–0.2)

## 2023-11-06 LAB — CBG MONITORING, ED: Glucose-Capillary: 118 mg/dL — ABNORMAL HIGH (ref 70–99)

## 2023-11-06 MED ORDER — HYDROXYZINE HCL 25 MG PO TABS
25.0000 mg | ORAL_TABLET | Freq: Three times a day (TID) | ORAL | Status: DC | PRN
  Administered 2023-11-06 – 2023-11-07 (×4): 25 mg via ORAL
  Filled 2023-11-06 (×4): qty 1

## 2023-11-06 MED ORDER — LORAZEPAM 2 MG/ML IJ SOLN
1.0000 mg | Freq: Three times a day (TID) | INTRAMUSCULAR | Status: DC | PRN
  Administered 2023-11-06: 1 mg via INTRAVENOUS
  Filled 2023-11-06: qty 1

## 2023-11-06 MED ORDER — QUETIAPINE FUMARATE 25 MG PO TABS
50.0000 mg | ORAL_TABLET | Freq: Every day | ORAL | Status: DC
Start: 2023-11-06 — End: 2023-11-06

## 2023-11-06 MED ORDER — GABAPENTIN 300 MG PO CAPS
300.0000 mg | ORAL_CAPSULE | Freq: Every day | ORAL | Status: DC
  Administered 2023-11-06: 300 mg via ORAL
  Filled 2023-11-06: qty 1

## 2023-11-06 MED ORDER — NICOTINE 14 MG/24HR TD PT24
14.0000 mg | MEDICATED_PATCH | Freq: Every day | TRANSDERMAL | Status: DC
  Administered 2023-11-06 – 2023-11-07 (×2): 14 mg via TRANSDERMAL
  Filled 2023-11-06 (×2): qty 1

## 2023-11-06 MED ORDER — QUETIAPINE FUMARATE 25 MG PO TABS: 50.0000 mg | ORAL_TABLET | Freq: Every day | ORAL | Status: DC

## 2023-11-06 MED ORDER — HALOPERIDOL LACTATE 5 MG/ML IJ SOLN: 5.0000 mg | Freq: Once | INTRAMUSCULAR | Status: DC | PRN

## 2023-11-06 MED ORDER — QUETIAPINE FUMARATE 50 MG PO TABS
50.0000 mg | ORAL_TABLET | Freq: Every day | ORAL | Status: DC
Start: 2023-11-06 — End: 2023-11-07
  Administered 2023-11-06: 50 mg via ORAL
  Filled 2023-11-06: qty 1

## 2023-11-06 NOTE — ED Notes (Signed)
Dr. Joneen Roach paged by this nurse for second attempt at 0037 which was not returned. This nurse requested unit secretary to page physician at approx. 0100 and call received back at 0103 to which physician states she is aware of the three pages in 2 hours from this nurse and that she is not the admitting provider, requested to know what was necessary to page provider this many times. Provider informed that per docu entation by admitting physician pt is NOT medically cleared at this time, however there are no orders regarding medical clearance, or order to defer to psychiatry for medication orders. Physician informed of pt erratic behavior and panicked state, to which provider states this is a psychiatric issue and that they have ordered to hold medications for the time being, so any further orders will need to come from psychiatry. Pt not medically cleared at this time as per physician note from 1737 11/05/23, pt will require additional EKG later this morning 11/06/23 and physician reassessment to determine pt medical clearance. Informed that the medical covering night time physician, Dr. Joneen Roach, will not be addressing any further concerns with pt and to redirect any questions, concerns to psychiatry by nursing.

## 2023-11-06 NOTE — ED Notes (Signed)
Pt symptoms consistent with withdrawal symptoms, however, pt reports she does not use any alcohol or opiate based drugs. See RN COWS and CIWA assessments at this time. Call placed to psychiatric NP on-call Lerry Liner, for further direction on pt care, voicemail left and awaiting call back at this time.

## 2023-11-06 NOTE — ED Notes (Signed)
Called report to Morrie Sheldon, Charity fundraiser at [employer].
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
## 2023-11-06 NOTE — Progress Notes (Addendum)
°  Progress Note   Patient: Barbara Gordon ZOX:[id number] DOB: 14-Sep-1991 DOA: 11/04/2023     1 DOS: the patient was seen and examined on 11/06/2023 at 8:28AM      Brief hospital course: 32 y.o. F with history of schizophrenia, suicide attempt, polysubstance abuse, TBI/seizure, smoking and chronic hepatitis C and THC use disorder who presented to [facility] with intentional overdose of "seizure medicines" or "sleeping pills".  Transferred to the ER for medical clearance.     Assessment and Plan: Intentional gabapentin overdose Suicidal ideation Patient presented with intentional overdose of gabapentin with reported intention to me of harming herself.  She has been evaluated by psychiatry recommend inpatient psychiatric treatment after medical clearance.  This morning repeat EKG is normal.  She has been 24 hours since her ingestion, and is asymptomatic.  No further treatment necessary  - Continue IVC - Consult psychiatry - Medically cleared for discharge to inpatient psychiatry  Schizophrenia - Resume Seroquel   Reported history of seizures Single seizure in Mar 2023 in setting of epidural hematoma due to an attempted suicide.  Was seen by Neurology inpatient at that time (never outpatient) and titrated up on Lamictal.  Somewhere along the line has transitioned to gabapentin. - Resume gabapentin tomorrow - Needs outpatient neurology follow up to establish a goal for AED  History of abnormal TSH without history of hypothyroidism TSH normal last July off meds  Chronic hepatitis C -Outpatient follow-up with ID  Borderline personality disorder - Consult psychiatry  STD screening She has asked for STD screening.  These are ordered - Follow HIV, RPR - Follow GC/chlamydia       Subjective: Feels well, would like something for nicotine replacement.  Little restless overnight because she was in the ER.     Physical Exam: BP 111/84 (BP Location: Right Arm)   Pulse 88   Temp  97.9 F (36.6 C) (Oral)   Resp 16   Ht 5\' 1"  (1.549 m)   Wt 55.4 kg   LMP 11/04/2023 (Exact Date)   SpO2 97%   BMI 23.07 kg/m   Adult female, lying in bed, interactive and appropriate RRR, no murmurs, no peripheral edema Respiratory rate normal, lungs clear without rales or wheezes Abdomen soft without tenderness palpation or guarding, no ascites or distention Attention normal, affect appropriate, judgment and insight appear normal, face symmetric, speech fluent, oriented to person, place, and time    Data Reviewed: Discussed with psychiatry team INR 1 Comprehensive metabolic panel normal CBC normal EKG, personally reviewed, shows normal QT, normal sinus rhythm, no ST changes  Family Communication: None    Disposition: Status is: Inpatient The patient was admitted for intentional overdose of gabapentin  She is outside the window for Intentional overdose, she appears asymptomatic, she has been cleared after counseling by poison control, and she is medically cleared for discharge to inpatient psychiatry whenever bed is available.  In the meantime she is under IVC, and psychiatry were consulted        Author: Alberteen Sam, MD 11/06/2023 3:25 PM  For on call review [email or web address].

## 2023-11-07 ENCOUNTER — Other Ambulatory Visit: Payer: Self-pay

## 2023-11-07 ENCOUNTER — Inpatient Hospital Stay (HOSPITAL_COMMUNITY)
Admission: AD | Admit: 2023-11-07 | Discharge: 2023-11-21 | DRG: 885 | Disposition: A | Discharge status: Home / Self Care | Payer: MEDICAID | Source: Intra-hospital | Attending: Psychiatry | Admitting: Psychiatry

## 2023-11-07 ENCOUNTER — Ambulatory Visit (HOSPITAL_COMMUNITY)
Admission: EM | Admit: 2023-11-07 | Discharge: 2023-11-07 | Disposition: A | Discharge status: Other Acute Inpatient Hospital | Payer: MEDICAID | Attending: Psychiatry | Admitting: Psychiatry

## 2023-11-07 DIAGNOSIS — E559 Vitamin D deficiency, unspecified: Secondary | ICD-10-CM | POA: Diagnosis present

## 2023-11-07 DIAGNOSIS — Z23 Encounter for immunization: Secondary | ICD-10-CM | POA: Diagnosis not present

## 2023-11-07 DIAGNOSIS — G4700 Insomnia, unspecified: Secondary | ICD-10-CM | POA: Diagnosis present

## 2023-11-07 DIAGNOSIS — Z79899 Other long term (current) drug therapy: Secondary | ICD-10-CM | POA: Diagnosis not present

## 2023-11-07 DIAGNOSIS — F201 Disorganized schizophrenia: Principal | ICD-10-CM | POA: Diagnosis present

## 2023-11-07 DIAGNOSIS — I10 Essential (primary) hypertension: Secondary | ICD-10-CM | POA: Diagnosis present

## 2023-11-07 DIAGNOSIS — Z5982 Transportation insecurity: Secondary | ICD-10-CM

## 2023-11-07 DIAGNOSIS — T50902D Poisoning by unspecified drugs, medicaments and biological substances, intentional self-harm, subsequent encounter: Secondary | ICD-10-CM | POA: Diagnosis not present

## 2023-11-07 DIAGNOSIS — F17210 Nicotine dependence, cigarettes, uncomplicated: Secondary | ICD-10-CM | POA: Diagnosis present

## 2023-11-07 DIAGNOSIS — Z653 Problems related to other legal circumstances: Secondary | ICD-10-CM | POA: Diagnosis not present

## 2023-11-07 DIAGNOSIS — Z885 Allergy status to narcotic agent status: Secondary | ICD-10-CM | POA: Diagnosis not present

## 2023-11-07 DIAGNOSIS — Z9151 Personal history of suicidal behavior: Secondary | ICD-10-CM

## 2023-11-07 DIAGNOSIS — E039 Hypothyroidism, unspecified: Secondary | ICD-10-CM | POA: Diagnosis present

## 2023-11-07 DIAGNOSIS — F332 Major depressive disorder, recurrent severe without psychotic features: Secondary | ICD-10-CM | POA: Diagnosis present

## 2023-11-07 DIAGNOSIS — Z91040 Latex allergy status: Secondary | ICD-10-CM | POA: Diagnosis not present

## 2023-11-07 DIAGNOSIS — E538 Deficiency of other specified B group vitamins: Secondary | ICD-10-CM | POA: Diagnosis present

## 2023-11-07 DIAGNOSIS — Z5900 Homelessness unspecified: Secondary | ICD-10-CM | POA: Insufficient documentation

## 2023-11-07 DIAGNOSIS — F209 Schizophrenia, unspecified: Secondary | ICD-10-CM | POA: Insufficient documentation

## 2023-11-07 DIAGNOSIS — F411 Generalized anxiety disorder: Secondary | ICD-10-CM | POA: Diagnosis present

## 2023-11-07 DIAGNOSIS — F419 Anxiety disorder, unspecified: Secondary | ICD-10-CM | POA: Insufficient documentation

## 2023-11-07 DIAGNOSIS — Z5901 Sheltered homelessness: Secondary | ICD-10-CM

## 2023-11-07 DIAGNOSIS — F17290 Nicotine dependence, other tobacco product, uncomplicated: Secondary | ICD-10-CM | POA: Diagnosis present

## 2023-11-07 DIAGNOSIS — R4589 Other symptoms and signs involving emotional state: Secondary | ICD-10-CM | POA: Insufficient documentation

## 2023-11-07 LAB — GC/CHLAMYDIA PROBE AMP (~~LOC~~) NOT AT ARMC
Chlamydia: NEGATIVE
Comment: NEGATIVE
Comment: NORMAL
Neisseria Gonorrhea: NEGATIVE

## 2023-11-07 LAB — RPR: RPR Ser Ql: NONREACTIVE

## 2023-11-07 LAB — GLUCOSE, CAPILLARY: Glucose-Capillary: 98 mg/dL (ref 70–99)

## 2023-11-07 LAB — HIV ANTIBODY (ROUTINE TESTING W REFLEX): HIV Screen 4th Generation wRfx: NONREACTIVE

## 2023-11-07 MED ORDER — ACETAMINOPHEN 325 MG PO TABS
650.0000 mg | ORAL_TABLET | Freq: Four times a day (QID) | ORAL | Status: DC | PRN
  Filled 2023-11-07: qty 2

## 2023-11-07 MED ORDER — NICOTINE 14 MG/24HR TD PT24
14.0000 mg | MEDICATED_PATCH | Freq: Every day | TRANSDERMAL | Status: DC
  Administered 2023-11-08 – 2023-11-21 (×14): 14 mg via TRANSDERMAL
  Filled 2023-11-07 (×18): qty 1

## 2023-11-07 MED ORDER — MELATONIN 5 MG PO TABS
5.0000 mg | ORAL_TABLET | Freq: Every day | ORAL | Status: DC
  Administered 2023-11-07 – 2023-11-20 (×14): 5 mg via ORAL
  Filled 2023-11-07 (×19): qty 1

## 2023-11-07 MED ORDER — MAGNESIUM HYDROXIDE 400 MG/5ML PO SUSP: 30.0000 mL | Freq: Every day | ORAL | Status: DC | PRN

## 2023-11-07 MED ORDER — DIPHENHYDRAMINE HCL 50 MG/ML IJ SOLN: 50.0000 mg | Freq: Three times a day (TID) | INTRAMUSCULAR | Status: DC | PRN

## 2023-11-07 MED ORDER — LORAZEPAM 2 MG/ML IJ SOLN: 2.0000 mg | Freq: Three times a day (TID) | INTRAMUSCULAR | Status: DC | PRN

## 2023-11-07 MED ORDER — GABAPENTIN 300 MG PO CAPS
300.0000 mg | ORAL_CAPSULE | Freq: Every day | ORAL | Status: DC
  Administered 2023-11-07 – 2023-11-20 (×14): 300 mg via ORAL
  Filled 2023-11-07 (×19): qty 1

## 2023-11-07 MED ORDER — HALOPERIDOL LACTATE 5 MG/ML IJ SOLN: 5.0000 mg | Freq: Three times a day (TID) | INTRAMUSCULAR | Status: DC | PRN

## 2023-11-07 MED ORDER — QUETIAPINE FUMARATE 50 MG PO TABS
50.0000 mg | ORAL_TABLET | Freq: Every day | ORAL | Status: DC
  Administered 2023-11-07: 50 mg via ORAL
  Filled 2023-11-07 (×4): qty 1

## 2023-11-07 MED ORDER — HYDROXYZINE HCL 25 MG PO TABS
25.0000 mg | ORAL_TABLET | Freq: Three times a day (TID) | ORAL | Status: DC | PRN
  Administered 2023-11-07 – 2023-11-21 (×18): 25 mg via ORAL
  Filled 2023-11-07 (×18): qty 1

## 2023-11-07 MED ORDER — ALUM & MAG HYDROXIDE-SIMETH 200-200-20 MG/5ML PO SUSP: 30.0000 mL | ORAL | Status: DC | PRN

## 2023-11-07 NOTE — TOC Initial Note (Signed)
[facility] - Initial/Assessment Note    Patient Details  Name: Barbara Gordon MRN: [id number] Date of Birth: February 23, 1991  Transition of [facility]) CM/SW Contact:    Larrie Kass, LCSW Phone Number: 11/07/2023, 10:33 AM  Clinical Narrative:                 Per chart review, the patient is from [facility] and is under IVC, which expires on 11/11/23. The patient is recommended for inpatient psychiatric care when medically stable. TOC to follow.  Expected Discharge Plan: Psychiatric Hospital Barriers to Discharge: Continued Medical Work up   Patient Goals and CMS Choice            Expected Discharge Plan and Services                                              Prior Living Arrangements/Services                       Activities of Daily Living   ADL Screening (condition at time of admission) Independently performs ADLs?: Yes (appropriate for developmental age) Is the patient deaf or have difficulty hearing?: No Does the patient have difficulty seeing, even when wearing glasses/contacts?: No Does the patient have difficulty concentrating, remembering, or making decisions?: No  Permission Sought/Granted                  Emotional Assessment           Psych Involvement: Yes (comment)  Admission diagnosis:  Overdose [T50.901A] Overdose by ingestion [T50.901A] Patient Active Problem List   Diagnosis Date Noted   Overdose 11/05/2023   Psychosis (HCC) 10/27/2023   Paranoia (HCC) 10/27/2023   Chronic hepatitis C without hepatic coma (HCC) 06/27/2023   Schizophrenia, paranoid, chronic (HCC) 04/18/2022   Acquired hypothyroidism 04/10/2022   Borderline personality disorder (HCC) 01/26/2022   Cannabis use with psychotic disorder (HCC) 01/19/2022   History of seizure 01/19/2022   Suicidal ideation 10/09/2021   At high risk for self harm 09/30/2021   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   Polysubstance abuse  (HCC) 05/24/2021   Substance-induced disorder (HCC) 05/24/2021   Malingering 04/29/2021   Severe cocaine use disorder (HCC) 04/28/2021   Amphetamine intoxication with perceptual disturbance (HCC) 04/27/2021   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   Elevated CPK 01/01/2021   Amphetamine use disorder, 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   Closed fracture of temporal bone (HCC) 01/09/2020   Epidural hematoma (HCC) 01/09/2020   Self-inflicted injury 01/09/2020   Traumatic epidural hematoma without loss of consciousness (HCC) 01/09/2020   Cannabis use disorder, severe, dependence (HCC) 04/01/2018   Cigarette nicotine dependence without complication 04/01/2018   Moderate opioid use disorder (HCC) 04/01/2018   Moderate sedative, hypnotic, 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 [place] [address] Phone: [phone] Fax: [phone]  [facility] [address], [place] - [address] [place] [address] Phone: [phone] Fax: [phone]  [facility] [address] [address] Phone: [phone] Fax: [phone]  [facility] [address] [place] [address]  Phone: [phone] Fax: [phone]     Social Drivers of Health (SDOH) Social History: SDOH Screenings   Food Insecurity: No Food Insecurity (11/06/2023)  Housing: High Risk (11/06/2023)  Transportation Needs: Unmet Transportation Needs (11/06/2023)  Utilities: At Risk (11/06/2023)  Alcohol Screen: Low Risk  (04/18/2022)  Depression (PHQ2-9): Low Risk  (06/27/2023)  Financial Resource Strain: Low Risk  (11/20/2022)   Received from [facility]  Social Connections: Unknown (03/09/2022)   Received from  [facility]  Stress: No Stress Concern Present (11/20/2022)   Received from [facility]  Tobacco Use: High Risk (11/04/2023)   SDOH Interventions:     Readmission Risk Interventions     No data to display

## 2023-11-07 NOTE — Progress Notes (Signed)
MD updated via phone Patient at this time with increased anxiety pacing in the room. PRN Atarax ordered PRN 3 times per Day given at 8:45 am and MD approved given Dose now. Medication given and Patietn given reassurance and support

## 2023-11-07 NOTE — ED Notes (Signed)
Case # [id number]

## 2023-11-07 NOTE — Tx Team (Signed)
Initial Treatment Plan 11/07/2023 Nadalynn Daigneault ZOX:[id number]    PATIENT STRESSORS: Medication change or noncompliance   Other: Patient unable to identify any stressors     PATIENT STRENGTHS: Communication skills  General fund of knowledge  Motivation for treatment/growth  Supportive family/friends    PATIENT IDENTIFIED PROBLEMS: SI  Depression  "I'm not sure what I want to work on"                 DISCHARGE CRITERIA:  Improved stabilization in mood, thinking, and/or behavior Motivation to continue treatment in a less acute level of care Need for constant or close observation no longer present Reduction of life-threatening or endangering symptoms to within safe limits Verbal commitment to aftercare and medication compliance  PRELIMINARY DISCHARGE PLAN: Outpatient therapy Return to previous living arrangement  PATIENT/FAMILY INVOLVEMENT: This treatment plan has been presented to and reviewed with the patient, Robena Deyoe. The patient has been given the opportunity to ask questions and make suggestions.  Daneil Dan, RN 11/07/2023,

## 2023-11-07 NOTE — Plan of Care (Signed)
  Problem: Education: Goal: Knowledge of General Education information will improve Description: Including pain rating scale, medication(s)/side effects and non-pharmacologic comfort measures 11/07/2023 0454 by Darrien Laakso, Caleen Jobs, RN Outcome: Progressing 11/07/2023 0454 by Vita Barley, RN Outcome: Progressing   Problem: Health Behavior/Discharge Planning: Goal: Ability to manage health-related needs will improve 11/07/2023 0454 by Briant Angelillo A, RN Outcome: Progressing 11/07/2023 0454 by Lady Saucier A, RN Outcome: Progressing   Problem: Clinical Measurements: Goal: Ability to maintain clinical measurements within normal limits will improve 11/07/2023 0454 by Keaundre Thelin A, RN Outcome: Progressing 11/07/2023 0454 by Vita Barley, RN Outcome: Progressing Goal: Will remain free from infection 11/07/2023 0454 by Addilyne Backs, Caleen Jobs, RN Outcome: Progressing 11/07/2023 0454 by Vita Barley, RN Outcome: Progressing Goal: Diagnostic test results will improve 11/07/2023 0454 by Airabella Barley, Caleen Jobs, RN Outcome: Progressing 11/07/2023 0454 by Vita Barley, RN Outcome: Progressing Goal: Respiratory complications will improve 11/07/2023 0454 by Tavaughn Silguero A, RN Outcome: Progressing 11/07/2023 0454 by Kechia Yahnke A, RN Outcome: Progressing Goal: Cardiovascular complication will be avoided Outcome: Progressing   Problem: Activity: Goal: Risk for activity intolerance will decrease Outcome: Progressing   Problem: Nutrition: Goal: Adequate nutrition will be maintained Outcome: Progressing   Problem: Coping: Goal: Level of anxiety will decrease Outcome: Progressing   Problem: Elimination: Goal: Will not experience complications related to bowel motility Outcome: Progressing Goal: Will not experience complications related to urinary retention Outcome: Progressing   Problem: Pain Management: Goal: General experience of comfort will  improve Outcome: Progressing   Problem: Safety: Goal: Ability to remain free from injury will improve Outcome: Progressing   Problem: Skin Integrity: Goal: Risk for impaired skin integrity will decrease Outcome: Progressing

## 2023-11-07 NOTE — Discharge Instructions (Signed)
Patient is being discharged to behaviorial health

## 2023-11-07 NOTE — Discharge Summary (Signed)
Physician Discharge Summary  Barbara Gordon XBM:[id number] DOB: 1991-08-01 DOA: 11/04/2023  PCP: Patient, No Pcp Per  Admit date: 11/04/2023  Discharge date: 11/07/2023  Admitted From: Home Disposition:Behavioral Health  Recommendations for Outpatient Follow-up:  Follow up with PCP in 1-2 weeks Please obtain BMP/CBC in one week Patient is being discharged to behavioral health.  Home Health: None Equipment/Devices: None  Discharge Condition: Stable. CODE STATUS:Full code Diet recommendation: Regular  Brief Summary/ Hospital Course: This 32 yrs old Female with history of schizophrenia, suicide attempt, polysubstance abuse, TBI/seizure, smoking and chronic hepatitis C and THC use disorder who presented to [facility] with intentional overdose of "seizure medicines" or "sleeping pills".  Transferred to the ER for medical clearance.  Patient was admitted for further evaluation.  Patient was evaluated by psychiatry,  recommended inpatient psych hospital once medically cleared. Patient is now medically clear, transferred to behavorial health for further management.  Discharge Diagnoses:  Principal Problem:   Overdose Active Problems:   Borderline personality disorder (HCC)   MDD (major depressive disorder), recurrent episode, severe (HCC)  Intentional gabapentin overdose: Suicidal attempt: Patient presented with intentional overdose of gabapentin with reported intention of harming herself.   She has been evaluated by psychiatry recommend inpatient psychiatric treatment after medical clearance. Repeat EKG is normal.  She has been 24 hours since her ingestion, and is asymptomatic.   No further treatment necessary  Continue IVC until 11/10/22 Medically cleared for discharge to inpatient psychiatry. TOC working on inpatient psych hospitalization.   Schizophrenia: Resume Seroquel   Reported history of seizures: She reports single seizure in March 2023 in setting of epidural hematoma due to  an attempted suicide.  She was seen by Neurology inpatient at that time (never outpatient) and titrated up on Lamictal.   Somewhere along the line has transitioned to gabapentin. Continue gabapentin 300 mg daily Needs outpatient neurology follow up to establish a goal for AED   History of abnormal TSH without history of hypothyroidism: Obtain full thyroid profile.   Chronic hepatitis C: Outpatient follow-up with ID   Borderline personality disorder Consulted psychiatry   STD screening She has asked for STD screening.   - Follow HIV, RPR - Follow GC/chlamydia  Discharge Instructions  Discharge Instructions     Call MD for:  difficulty breathing, headache or visual disturbances   Complete by: As directed    Call MD for:  persistant nausea and vomiting   Complete by: As directed    Diet - low sodium heart healthy   Complete by: As directed    Diet general   Complete by: As directed    Discharge instructions   Complete by: As directed    Patient is being discharged to inpatient psych facility for suicidal ideations.   Increase activity slowly   Complete by: As directed       Allergies as of 11/07/2023       Reactions   Latex Itching, Rash   Tramadol Itching, Other (See Comments)   Provider: Crist Fat CFM - Allergy Description: TraMADol HCl *ANALGESICS - OPIOID* CFM - Allergy Annotation: Pruritus.        Medication List     TAKE these medications    gabapentin 100 MG capsule Commonly known as: NEURONTIN Take 3 capsules (300 mg total) by mouth at bedtime.   OLANZapine 5 MG tablet Commonly known as: ZYPREXA Take 5 mg by mouth at bedtime.   QUEtiapine 50 MG tablet Commonly known as: SEROquel Take 1 tablet (50 mg total)  by mouth at bedtime.        Allergies  Allergen Reactions   Latex Itching and Rash   Tramadol Itching and Other (See Comments)    Provider: Crist Fat CFM - Allergy Description: TraMADol HCl *ANALGESICS - OPIOID* CFM -  Allergy Annotation: Pruritus.     Consultations: Psychiatry   Procedures/Studies: No results found.    Subjective: Patient seen and examined, reports feeling better but still has suicidal ideation. Patient being discharged to behavorial health.  Discharge Exam: Vitals:   11/07/23 0635 11/07/23 1349  BP: (!) 144/87 132/88  Pulse: 94 100  Resp: 16 18  Temp: 98.2 F (36.8 C) 97.8 F (36.6 C)  SpO2: 98% 98%   Vitals:   11/07/23 0004 11/07/23 0500 11/07/23 0635 11/07/23 1349  BP: 137/78  (!) 144/87 132/88  Pulse: 76  94 100  Resp: 18  16 18   Temp: 97.9 F (36.6 C)  98.2 F (36.8 C) 97.8 F (36.6 C)  TempSrc:   Oral   SpO2: 98%  98% 98%  Weight:  55.4 kg    Height:        General: Pt is alert, awake, not in acute distress Cardiovascular: RRR, S1/S2 +, no rubs, no gallops Respiratory: CTA bilaterally, no wheezing, no rhonchi Abdominal: Soft, NT, ND, bowel sounds + Extremities: no edema, no cyanosis    The results of significant diagnostics from this hospitalization (including imaging, microbiology, ancillary and laboratory) are listed below for reference.     Microbiology: No results found for this or any previous visit (from the past 240 hours).   Labs: BNP (last 3 results) No results for input(s): "BNP" in the last 8760 hours. Basic Metabolic Panel: Recent Labs  Lab 11/04/23 2247 11/05/23 0639 11/06/23 0434  NA 135 138 136  K 3.1* 4.3 3.7  CL 102 109 106  CO2 19* 21* 18*  GLUCOSE 111* 108* 127*  BUN 6 <5* 9  CREATININE 0.79 0.68 0.83  CALCIUM 9.8 9.2 9.5   Liver Function Tests: Recent Labs  Lab 11/04/23 2247 11/05/23 0639 11/06/23 0434  AST 17 18 20   ALT 16 16 16   ALKPHOS 50 48 51  BILITOT 1.0 0.8 0.6  PROT 7.8 7.4 7.6  ALBUMIN 4.8 4.3 4.6   No results for input(s): "LIPASE", "AMYLASE" in the last 168 hours. No results for input(s): "AMMONIA" in the last 168 hours. CBC: Recent Labs  Lab 11/04/23 2230 11/05/23 0639 11/06/23 0434   WBC 9.4 6.1 7.9  NEUTROABS  --  3.6  --   HGB 15.7* 14.5 15.1*  HCT 46.9* 41.4 42.4  MCV 92.9 88.5 88.3  PLT 314 317 345   Cardiac Enzymes: No results for input(s): "CKTOTAL", "CKMB", "CKMBINDEX", "TROPONINI" in the last 168 hours. BNP: Invalid input(s): "POCBNP" CBG: Recent Labs  Lab 11/04/23 2257 11/05/23 0822 11/06/23 0806 11/07/23 0754  GLUCAP 110* 97 118* 98   D-Dimer No results for input(s): "DDIMER" in the last 72 hours. Hgb A1c No results for input(s): "HGBA1C" in the last 72 hours. Lipid Profile No results for input(s): "CHOL", "HDL", "LDLCALC", "TRIG", "CHOLHDL", "LDLDIRECT" in the last 72 hours. Thyroid function studies No results for input(s): "TSH", "T4TOTAL", "T3FREE", "THYROIDAB" in the last 72 hours.  Invalid input(s): "FREET3" Anemia work up No results for input(s): "VITAMINB12", "FOLATE", "FERRITIN", "TIBC", "IRON", "RETICCTPCT" in the last 72 hours. Urinalysis    Component Value Date/Time   COLORURINE YELLOW 04/29/2022 2103   APPEARANCEUR CLOUDY (A) 04/29/2022 2103   LABSPEC  1.018 04/29/2022 2103   PHURINE 6.0 04/29/2022 2103   GLUCOSEU NEGATIVE 04/29/2022 2103   HGBUR NEGATIVE 04/29/2022 2103   BILIRUBINUR NEGATIVE 04/29/2022 2103   KETONESUR NEGATIVE 04/29/2022 2103   PROTEINUR NEGATIVE 04/29/2022 2103   UROBILINOGEN 1.0 06/23/2009 1520   NITRITE NEGATIVE 04/29/2022 2103   LEUKOCYTESUR NEGATIVE 04/29/2022 2103   Sepsis Labs Recent Labs  Lab 11/04/23 2230 11/05/23 0639 11/06/23 0434  WBC 9.4 6.1 7.9   Microbiology No results found for this or any previous visit (from the past 240 hours).   Time coordinating discharge: Over 30 minutes  SIGNED:   Willeen Niece, MD  [facility] 11/07/2023, 5:04 PM Pager   If 7PM-7AM, please contact night-coverage

## 2023-11-07 NOTE — Progress Notes (Signed)
Notified that patient has a bed at [facility] ([place]). Report called to this facility. MD aware of bed being available.

## 2023-11-07 NOTE — Plan of Care (Signed)

## 2023-11-07 NOTE — ED Notes (Signed)
[facility] at bedside to transfer pt to [facility], Pt remains IVC.

## 2023-11-07 NOTE — Consult Note (Signed)
[facility] Consult Follow-up  Patient Name: .Barbara Gordon  MRN: [id number]  DOB: [birth year]  Consult Order details:  Orders (From admission, onward)     Start     Ordered   11/05/23 1114  IP CONSULT TO PSYCHIATRY       Ordering Provider: Alberteen Sam, MD  Provider:  (Not yet assigned)  Question Answer Comment  Location [facility]   Reason for Consult? suicidal ideation, gabapentin overdose      11/05/23 1114             Mode of Visit: In person    Psychiatry Consult Evaluation  Service Date: November 07, 2023 LOS:  LOS: 2 days  Chief Complaint "I wanted to kill myself because I was tired of my boyfriend who has been controlling me."  Primary Psychiatric Diagnoses  Schizophrenia 2.  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  3.  Borderline personality disorder   Assessment  Barbara Gordon is a 32 y.o. female admitted: Presented to the EDfor 11/04/2023 10:12 PM for suicide attempt by overdose. She carries the psychiatric diagnoses of schizophrenia, Cannabis use disorder and Borderline personality disorder and has a past medical history of hypothyroidism, seizure disorder and chronic hepatitis C.  Her current presentation of impulsivity, emotional instability, anger issues, and recurrent suicide attempt  is most consistent with Borderline personality disorder. Current outpatient psychotropic medications include Seroquel 100 mg at bedtime and Gabapentin 300 mg at bedtime for seizures. Historically she has not had a good response to these medications. She was not compliant with medications prior to admission as evidenced by patient not able to identify which medication she takes as there is no Seroquel prescription on records. On initial examination, patient is calm and cooperative. Please see plan below for detailed recommendations.    11/07/23:The patient was seen face-to-face in her hospital room. She is awake, alert,  and oriented to person, place, time, and situation. The patient appears calm and cooperative, though she reports feeling tired today. Despite this, she was able to answer most questions appropriately. She is eating and sleeping without difficulty. Her memory recall is intact, she tells me she ate cornflakes for breakfast. Her suicide sitter nods her head in agreement.   Today, the patient denies any suicidal plan or intent, but she was unable to contract for safety. She has agreed to be transferred to the inpatient psychiatric unit once she is medically stable.  At one point during the evaluation, the patient became agitated, screaming and thrashing in bed, stating that she was tired and wanted to sleep. This outburst was sudden and frightening, yet unpredictable. However, she quickly returned to a calm state and continued answering questions as if nothing had occurred. She did not require any prn or verbal escalation.    Diagnoses:  Active Hospital problems: Principal Problem:   Overdose    Plan   ## Psychiatric Medication Recommendations:  -Continue Seroquel 50 mg at bedtime with the plan to up titrate. Note-home dose is 200 mg daily -May start Gabapentin 300 mg on 11/07/23 for seizure -Consider TOC/Social worker consult to facilitate transfer to inpatient psychiatric unit.    ## Medical Decision Making Capacity: Not specifically addressed in this encounter  ## Further Work-up:  -- most recent EKG on 11/05/23 had QtC of 438 -- Pertinent labwork reviewed earlier this admission includes:   ## Disposition:-- We recommend inpatient psychiatric hospitalization after medical hospitalization. Patient has been involuntarily committed on 11/04/23.   ##  Behavioral / Environmental: - Patients with borderline personality traits/disorder often use the language of physical pain to communicate both physical and emotional suffering. It is important to address pain complaints as they arise and attempt  to identify an etiology, either organic or psychiatric. In patients with chronic pain, it is important to have a discussion with the patient about expectations about pain control.    ## Safety and Observation Level:  - Based on my clinical evaluation, I estimate the patient to be at high risk of self harm in the current setting. - At this time, we recommend  1:1 Observation. This decision is based on my review of the chart including patient's history and current presentation, interview of the patient, mental status examination, and consideration of suicide risk including evaluating suicidal ideation, plan, intent, suicidal or self-harm behaviors, risk factors, and protective factors. This judgment is based on our ability to directly address suicide risk, implement suicide prevention strategies, and develop a safety plan while the patient is in the clinical setting. Please contact our team if there is a concern that risk level has changed.  CSSR Risk Category:C-SSRS RISK CATEGORY: High Risk  Suicide Risk Assessment: Patient has following modifiable risk factors for suicide: active suicidal ideation, recklessness, and medication noncompliance, which we are addressing by medication. Patient has following non-modifiable or demographic risk factors for suicide: history of suicide attempt, history of self harm behavior, and psychiatric hospitalization Patient has the following protective factors against suicide: Cultural, spiritual, or religious beliefs that discourage suicide  Thank you for this consult request. Recommendations have been communicated to the primary team.  Patient has been accepted to [facility], orders to be placed and psych consult service to sign off at this time.   Maryagnes Amos, FNP       History of Present Illness  Relevant Aspects of Hospital ED Course:  Admitted on 11/04/2023 for after ingestion of multiple pills of Seroquel. She is currently being worked up in the ED.    Patient Report:  The patient was seen in her room. She is alert and oriented to person, place, time, and situation (x4), and presents as calm and cooperative. The patient reports that she overdosed on multiple 100 mg Seroquel pills in an attempt to take her life. She indicates that she feels very sleepy today and wants to go to bed.  During the evaluation, the patient suddenly yelled out and thrashed in bed without warning. She acknowledges being impulsive and expresses a desire to improve her behavior, stating that she gets frustrated and aggressive easily. She identifies herself as very impulsive. The patient reports a psychiatric history of Borderline Personality Disorder, Major Depressive Disorder (MDD), and insomnia. She currently takes 100 mg of Seroquel at bedtime to help with sleep.    Psych ROS:  Depression: patient denies feeling depressed but reports poor sleep Anxiety: She denies anxiety attacks, irritability, apprehension or excessive restlessness.  Mania (lifetime and current):Patient reports getty irritable and aggressive easily but denies racing thoughts, increased in goal directed activities, decreased need for sleep or having excessive energy.  Psychosis: (lifetime and current): patient denies AVH or paranoid delusion.   Collateral information:  Attempt to obtain collateral information from boyfriend failed as he dis not answer his phone Dianna Rossetti [phone]).   Review of Systems  Psychiatric/Behavioral:  Positive for substance abuse and suicidal ideas.      Psychiatric and Social History  Psychiatric History:  Information collected from patient as above.   Prev Dx/Sx:  Schizophrenia, cannabis use disorder, and Borderline personality disorder. Current Psych Provider: unsure  Home Meds (current): Seroquel 100 mg at bedtime  Previous Med Trials:Olanzapine 15 mg at bedtime  Therapy: unsure   Prior Psych Hospitalization: multiple prior psychiatric hospitalizations  due to suicide attempt.   Prior Self Harm: yes  Prior Violence: unsure   Family Psych History: mother is schizophrenic  Family Hx suicide: denies   Social History:  Developmental Hx: unable to assess Educational Hx: GED  Occupational Hx: Unemployed  Legal Hx: denies  Living Situation: lives with boyfriend  Spiritual Hx: unsure  Access to weapons/lethal means: denies    Substance History Alcohol: denies   Type of alcohol denies  Last Drink denies  Number of drinks per day denies  History of alcohol withdrawal seizures denies  History of DT's denies  Tobacco: denies  Illicit drugs: Smokes Cannabis daily  Prescription drug abuse: denies  Rehab hx: denies   Exam Findings  Physical Exam:  Vital Signs:  Temp:  [97.8 F (36.6 C)-98.2 F (36.8 C)] 97.8 F (36.6 C) (12/30 1349) Pulse Rate:  [76-100] 100 (12/30 1349) Resp:  [16-18] 18 (12/30 1349) BP: (129-144)/(78-88) 132/88 (12/30 1349) SpO2:  [97 %-98 %] 98 % (12/30 1349) Weight:  [55.4 kg] 55.4 kg (12/30 0500) Blood pressure 132/88, pulse 100, temperature 97.8 F (36.6 C), resp. rate 18, height 5\' 1"  (1.549 m), weight 55.4 kg, last menstrual period 11/04/2023, SpO2 98%. Body mass index is 23.08 kg/m.  Physical Exam  Mental Status Exam: General Appearance: Casual  Orientation:  Full (Time, Place, and Person)  Memory:  Immediate;   Good  Concentration:  Concentration: Good and Attention Span: Good  Recall:  Good  Attention  Good  Eye Contact:  Good  Speech:  Clear and Coherent  Language:  Good  Volume:  Normal  Mood: "ok"  Affect:  Constricted  Thought Process:  Coherent  Thought Content:  Logical  Suicidal Thoughts:  Yes.  without intent/plan  Homicidal Thoughts:  No  Judgement:  Poor  Insight:  Lacking  Psychomotor Activity:  Normal  Akathisia:  Negative  Fund of Knowledge:  Fair      Assets:  Communication Skills  Cognition:  WNL  ADL's:  Intact  AIMS (if indicated):        Other History   These  have been pulled in through the EMR, reviewed, and updated if appropriate.  Family History:  The patient's family history is not on file.  Medical History: Past Medical History:  Diagnosis Date   Hypothyroidism    Polysubstance abuse (HCC)    Schizophrenia (HCC)     Surgical History: History reviewed. No pertinent surgical history.   Medications:   Current Facility-Administered Medications:    gabapentin (NEURONTIN) capsule 300 mg, 300 mg, Oral, QHS, Danford, Earl Lites, MD, 300 mg at 11/06/23 2127   heparin injection 5,000 Units, 5,000 Units, Subcutaneous, Q8H, Skip Mayer A, MD, 5,000 Units at 11/06/23 2128   hydrOXYzine (ATARAX) tablet 25 mg, 25 mg, Oral, TID PRN, Alberteen Sam, MD, 25 mg at 11/07/23 1347   melatonin tablet 5 mg, 5 mg, Oral, QHS, McCauley, Larry B, PA-C, 5 mg at 11/06/23 2127   nicotine (NICODERM CQ - dosed in mg/24 hours) patch 14 mg, 14 mg, Transdermal, Daily, Danford, Earl Lites, MD, 14 mg at 11/07/23 1047   QUEtiapine (SEROQUEL) tablet 50 mg, 50 mg, Oral, QHS, Danford, Earl Lites, MD, 50 mg at 11/06/23 2127  Allergies: Allergies  Allergen  Reactions   Latex Itching and Rash   Tramadol Itching and Other (See Comments)    Provider: Crist Fat CFM - Allergy Description: TraMADol HCl *ANALGESICS - OPIOID* CFM - Allergy Annotation: Pruritus.     Maryagnes Amos, FNP

## 2023-11-07 NOTE — Care Management (Signed)
Called [facility] to transport patient to [facility]. No ETA at this time.  No additional TOC needs at this time.   Notified RN, MD, [facility] staff.

## 2023-11-07 NOTE — Progress Notes (Signed)
PROGRESS NOTE    Barbara Gordon  XBJ:[id number] DOB: Sep 20, 1991 DOA: 11/04/2023 PCP: Patient, No Pcp Per   Brief Narrative: This 32 yrs old Female with history of schizophrenia, suicide attempt, polysubstance abuse, TBI/seizure, smoking and chronic hepatitis C and THC use disorder who presented to [facility] with intentional overdose of "seizure medicines" or "sleeping pills".  Transferred to the ER for medical clearance.  Patient was admitted for further evaluation.  Patient was evaluated by psychiatry,  recommended inpatient psych hospital once medically cleared.  Assessment & Plan:   Principal Problem:   Overdose  Intentional gabapentin overdose: Suicidal attempt: Patient presented with intentional overdose of gabapentin with reported intention of harming herself.   She has been evaluated by psychiatry recommend inpatient psychiatric treatment after medical clearance. Repeat EKG is normal.  She has been 24 hours since her ingestion, and is asymptomatic.   No further treatment necessary  Continue IVC until 11/10/22 Medically cleared for discharge to inpatient psychiatry. TOC working on inpatient psych hospitalization.   Schizophrenia: Resume Seroquel   Reported history of seizures: She reports single seizure in March 2023 in setting of epidural hematoma due to an attempted suicide.  She was seen by Neurology inpatient at that time (never outpatient) and titrated up on Lamictal.   Somewhere along the line has transitioned to gabapentin. Continue gabapentin 300 mg daily Needs outpatient neurology follow up to establish a goal for AED   History of abnormal TSH without history of hypothyroidism: Obtain full thyroid profile.   Chronic hepatitis C: Outpatient follow-up with ID   Borderline personality disorder Consulted psychiatry   STD screening She has asked for STD screening.   - Follow HIV, RPR - Follow GC/chlamydia     DVT prophylaxis: Heparin Code Status: Full  code Family Communication: No family at bedside. Disposition Plan:  Status is: Inpatient Remains inpatient appropriate because: Admitted for suicidal attempt, drug overdose.  Patient is now medically clear,  awaiting inpatient psych hospitalization.   Consultants:  Psychiatry  Procedures:NONE  Antimicrobials: None  Subjective: Patient was seen and examined at bedside.  Overnight events noted.   Patient denies any suicidal ideations or thoughts. She is on one-to-one observation. Awaiting Psych inpatient hospitalization.  Objective: Vitals:   11/06/23 1854 11/07/23 0004 11/07/23 0500 11/07/23 0635  BP: 129/78 137/78  (!) 144/87  Pulse: 97 76  94  Resp: 16 18  16   Temp: 97.8 F (36.6 C) 97.9 F (36.6 C)  98.2 F (36.8 C)  TempSrc: Oral   Oral  SpO2: 97% 98%  98%  Weight:   55.4 kg   Height:        Intake/Output Summary (Last 24 hours) at 11/07/2023 1316 Last data filed at 11/07/2023 0843 Gross per 24 hour  Intake 720 ml  Output --  Net 720 ml   Filed Weights   11/06/23 1052 11/07/23 0500  Weight: 55.4 kg 55.4 kg    Examination:  General exam: Appears calm and comfortable, not in any acute distress. Respiratory system: Clear to auscultation. Respiratory effort normal.  RR 16 Cardiovascular system: S1 & S2 heard, RRR. No JVD, murmurs, rubs, gallops or clicks. No pedal edema. Gastrointestinal system: Abdomen is non distended, soft and non tender.  Normal bowel sounds heard. Central nervous system: Alert and oriented x 3. No focal neurological deficits. Extremities: Symmetric 5 x 5 power.  No edema, no cyanosis, no clubbing Skin: No rashes, lesions or ulcers Psychiatry: Judgement and insight appear normal.  Denies suicidal/homicidal ideations.  Data Reviewed: I have personally reviewed following labs and imaging studies  CBC: Recent Labs  Lab 11/04/23 2230 11/05/23 0639 11/06/23 0434  WBC 9.4 6.1 7.9  NEUTROABS  --  3.6  --   HGB 15.7* 14.5 15.1*  HCT  46.9* 41.4 42.4  MCV 92.9 88.5 88.3  PLT 314 317 345   Basic Metabolic Panel: Recent Labs  Lab 11/04/23 2247 11/05/23 0639 11/06/23 0434  NA 135 138 136  K 3.1* 4.3 3.7  CL 102 109 106  CO2 19* 21* 18*  GLUCOSE 111* 108* 127*  BUN 6 <5* 9  CREATININE 0.79 0.68 0.83  CALCIUM 9.8 9.2 9.5   GFR: Estimated Creatinine Clearance: 74.1 mL/min (by C-G formula based on SCr of 0.83 mg/dL). Liver Function Tests: Recent Labs  Lab 11/04/23 2247 11/05/23 0639 11/06/23 0434  AST 17 18 20   ALT 16 16 16   ALKPHOS 50 48 51  BILITOT 1.0 0.8 0.6  PROT 7.8 7.4 7.6  ALBUMIN 4.8 4.3 4.6   No results for input(s): "LIPASE", "AMYLASE" in the last 168 hours. No results for input(s): "AMMONIA" in the last 168 hours. Coagulation Profile: Recent Labs  Lab 11/06/23 0434  INR 1.0   Cardiac Enzymes: No results for input(s): "CKTOTAL", "CKMB", "CKMBINDEX", "TROPONINI" in the last 168 hours. BNP (last 3 results) No results for input(s): "PROBNP" in the last 8760 hours. HbA1C: No results for input(s): "HGBA1C" in the last 72 hours. CBG: Recent Labs  Lab 11/04/23 2257 11/05/23 0822 11/06/23 0806 11/07/23 0754  GLUCAP 110* 97 118* 98   Lipid Profile: No results for input(s): "CHOL", "HDL", "LDLCALC", "TRIG", "CHOLHDL", "LDLDIRECT" in the last 72 hours. Thyroid Function Tests: No results for input(s): "TSH", "T4TOTAL", "FREET4", "T3FREE", "THYROIDAB" in the last 72 hours. Anemia Panel: No results for input(s): "VITAMINB12", "FOLATE", "FERRITIN", "TIBC", "IRON", "RETICCTPCT" in the last 72 hours. Sepsis Labs: No results for input(s): "PROCALCITON", "LATICACIDVEN" in the last 168 hours.  No results found for this or any previous visit (from the past 240 hours).   Radiology Studies: No results found.  Scheduled Meds:  gabapentin  300 mg Oral QHS   heparin  5,000 Units Subcutaneous Q8H   melatonin  5 mg Oral QHS   nicotine  14 mg Transdermal Daily   QUEtiapine  50 mg Oral QHS    Continuous Infusions:   LOS: 2 days    Time spent: 50 MINS    Willeen Niece, MD [facility]   If 7PM-7AM, please contact night-coverage

## 2023-11-08 ENCOUNTER — Encounter (HOSPITAL_COMMUNITY): Payer: Self-pay | Admitting: Psychiatry

## 2023-11-08 DIAGNOSIS — F201 Disorganized schizophrenia: Principal | ICD-10-CM

## 2023-11-08 MED ORDER — DIPHENHYDRAMINE HCL 50 MG/ML IJ SOLN: 50.0000 mg | Freq: Three times a day (TID) | INTRAMUSCULAR | Status: DC | PRN

## 2023-11-08 MED ORDER — RISPERIDONE 1 MG PO TBDP
1.0000 mg | ORAL_TABLET | Freq: Every day | ORAL | Status: DC
Start: 2023-11-08 — End: 2023-11-11
  Administered 2023-11-08 – 2023-11-10 (×3): 1 mg via ORAL
  Filled 2023-11-08 (×4): qty 1

## 2023-11-08 MED ORDER — LORAZEPAM 2 MG/ML IJ SOLN: 2.0000 mg | Freq: Three times a day (TID) | INTRAMUSCULAR | Status: DC | PRN

## 2023-11-08 MED ORDER — HALOPERIDOL LACTATE 5 MG/ML IJ SOLN: 5.0000 mg | Freq: Three times a day (TID) | INTRAMUSCULAR | Status: DC | PRN

## 2023-11-08 MED ORDER — HALOPERIDOL LACTATE 5 MG/ML IJ SOLN: 10.0000 mg | Freq: Three times a day (TID) | INTRAMUSCULAR | Status: DC | PRN

## 2023-11-08 MED ORDER — HALOPERIDOL 5 MG PO TABS
5.0000 mg | ORAL_TABLET | Freq: Three times a day (TID) | ORAL | Status: DC | PRN
  Administered 2023-11-08: 5 mg via ORAL
  Filled 2023-11-08: qty 1

## 2023-11-08 MED ORDER — DIPHENHYDRAMINE HCL 25 MG PO CAPS
50.0000 mg | ORAL_CAPSULE | Freq: Three times a day (TID) | ORAL | Status: DC | PRN
Start: 2023-11-08 — End: 2023-11-21
  Administered 2023-11-08: 50 mg via ORAL
  Filled 2023-11-08: qty 2

## 2023-11-08 MED ORDER — QUETIAPINE FUMARATE 50 MG PO TABS
50.0000 mg | ORAL_TABLET | Freq: Every evening | ORAL | Status: DC | PRN
  Administered 2023-11-13 – 2023-11-20 (×8): 50 mg via ORAL
  Filled 2023-11-08 (×8): qty 1

## 2023-11-08 NOTE — Group Note (Signed)
 LCSW Group Therapy Note  Group Date: 11/08/2023 Start Time: 1100 End Time: 1200   Type of Therapy and Topic:  Group Therapy:  Stages of Change  Participation Level:  patient was present but didn't participate in the discussion  Description of Group: Discussed 5 stages of change, for example, when developing new habits and overcoming addictions:  pre-contemplation, contemplation, preparation, action and maintenance.  Discussed handout, why change is hard and completed a worksheet.  Therapeutic Goals: Patient will identify stage of change they are experiencing. Patient will recognize obstacles and triggers when making progress.      Summary of Patient Progress:   Patient appeared to be restless and was pacing during the group.  She was polite.     Therapeutic Modalities:  Elements of Motivational Interviewing (MI)  Vanessa Alesi O Ruberta Holck, LCSWA 11/08/2023  2:22 PM

## 2023-11-08 NOTE — Plan of Care (Signed)
  Problem: Coping: Goal: Ability to verbalize frustrations and anger appropriately will improve Outcome: Progressing   Problem: Safety: Goal: Periods of time without injury will increase Outcome: Progressing   Problem: Medication: Goal: Compliance with prescribed medication regimen will improve Outcome: Progressing   

## 2023-11-08 NOTE — [facility] Group Notes (Signed)
 [facility] Group Notes:  (Nursing/MHT/Case Management/Adjunct)  Date:  11/08/2023  Time:  9:57 PM  Type of Therapy:   Wrap-up group  Participation Level:  Minimal  Participation Quality:  Inattentive  Affect:  Flat  Cognitive:  Confused  Insight:  Limited  Engagement in Group:  Limited  Modes of Intervention:  Education  Summary of Progress/Problems: Pt attended group, unable to participate. Pt seemed confused.   Grayce LITTIE Essex 11/08/2023, 9:57 PM

## 2023-11-08 NOTE — [facility] Suicide Risk Assessment (Signed)
 Suicide Risk Assessment  Admission Assessment    [facility] Admission Suicide Risk Assessment   Nursing information obtained from:  Patient Demographic factors:  Caucasian, Unemployed Current Mental Status:  NA Loss Factors:  NA Historical Factors:  Prior suicide attempts, Impulsivity Risk Reduction Factors:  Living with another person, especially a relative, Positive social support  Total Time spent with patient: 45 minutes Principal Problem: Disorganized schizophrenia, chronic condition (HCC) Diagnosis:  Principal Problem:   Disorganized schizophrenia, chronic condition (HCC)  Subjective Data:  Jolayne Branson is a 32 yr old female who presented on 12/27 to [facility] after a Suicide Attempt (OD- Seroquel , Gabapentin ) and bizarre behavior, she was admitted to [facility] on 12/31.  PPHx is significant for Schizophrenia, MDD, Borderline Personality Disorder, and a history of Polysubstance Abuse (Amphetamines, Opioids, THC), and Multiple Prior Suicide Attempts (OD) and Multiple Prior Psychiatric Hospitalizations (last- [facility] 11/2022).   Last what led to her hospitalization she reports I do not know.  She states multiple times I am not feeling right.  When asked what symptoms she is having she reports she does not know.  When asked about prior psychiatric diagnoses she reports she does not have any.  When asked if she has ever been admitted to a psychiatric hospital or been on psychiatric medications she reports she does not think so.  She then states she wants to lie down and pulls the covers over her.   The rest of the information had to be obtained from chart review.  Continued Clinical Symptoms:  Alcohol Use Disorder Identification Test Final Score (AUDIT): 1 The Alcohol Use Disorders Identification Test, Guidelines for Use in Primary Care, Second Edition.  World Science Writer [facility]). Score between 0-7:  no or low risk or alcohol related problems. Score between 8-15:  moderate risk of alcohol  related problems. Score between 16-19:  high risk of alcohol related problems. Score 20 or above:  warrants further diagnostic evaluation for alcohol dependence and treatment.   CLINICAL FACTORS:   Personality Disorders:   Cluster B Currently Psychotic Unstable or Poor Therapeutic Relationship Previous Psychiatric Diagnoses and Treatments   Musculoskeletal: Strength & Muscle Tone: within normal limits Gait & Station: normal Patient leans: N/A  Psychiatric Specialty Exam:  Presentation  General Appearance:  Disheveled  Eye Contact: Fair (minimal blinking)  Speech: Blocked; Slow  Speech Volume: Decreased  Handedness: Right   Mood and Affect  Mood: Dysphoric  Affect: Flat; Constricted   Thought Process  Thought Processes: Disorganized; Irrevelant  Descriptions of Associations:Loose  Orientation:None  Thought Content:Delusions  History of Schizophrenia/Schizoaffective disorder:Yes  Duration of Psychotic Symptoms:Greater than six months  Hallucinations:Hallucinations: -- (Did not answer)  Ideas of Reference:None  Suicidal Thoughts:Suicidal Thoughts: No  Homicidal Thoughts:Homicidal Thoughts: No   Sensorium  Memory: Immediate Poor  Judgment: Impaired  Insight: Lacking   Executive Functions  Concentration: Poor  Attention Span: Poor  Recall: Poor  Fund of Knowledge: Poor  Language: Poor   Psychomotor Activity  Psychomotor Activity: Psychomotor Activity: Decreased   Assets  Assets: Resilience   Sleep  Sleep: Sleep: Fair Number of Hours of Sleep: 6    Physical Exam: Physical Exam Vitals and nursing note reviewed.  Constitutional:      General: She is not in acute distress.    Appearance: She is normal weight. She is not ill-appearing or toxic-appearing.  HENT:     Head: Normocephalic and atraumatic.  Pulmonary:     Effort: Pulmonary effort is normal.  Musculoskeletal:  General: Normal range of  motion.  Neurological:     General: No focal deficit present.     Mental Status: She is alert.    Review of Systems  Unable to perform ROS: Psychiatric disorder   Blood pressure (!) 148/109, pulse 97, temperature 98.5 F (36.9 C), temperature source Oral, resp. rate 18, height 5' 1 (1.549 m), weight 54.4 kg, last menstrual period 11/04/2023, SpO2 100%. Body mass index is 22.67 kg/m.   COGNITIVE FEATURES THAT CONTRIBUTE TO RISK:  Loss of executive function and Thought constriction (tunnel vision)    SUICIDE RISK:   Moderate:  Frequent suicidal ideation with limited intensity, and duration, some specificity in terms of plans, no associated intent, good self-control, limited dysphoria/symptomatology, some risk factors present, and identifiable protective factors, including available and accessible social support.  PLAN OF CARE:  Merrill Deanda is a 32 yr old female who presented on 12/27 to [facility] after a Suicide Attempt (OD- Seroquel , Gabapentin ) and bizarre behavior, she was admitted to [facility] on 12/31.  PPHx is significant for Schizophrenia, MDD, Borderline Personality Disorder, and a history of Polysubstance Abuse (Amphetamines, Opioids, THC), and Multiple Prior Suicide Attempts (OD) and Multiple Prior Psychiatric Hospitalizations (last- [facility] 11/2022).     Sheron is current very disorganized and psychotic, alternating between pacing up and down the unit and laying down in bed.  As has significant doses of Seroquel  would be needed to address her schizophrenia we will change this to Risperdal  as it is more potent, can be increased quicker, and does give the option of an LAI.  We will keep Seroquel  as needed low dose for sleep.  We will order a urine Preg, lipid panel, and A1c.  We will not make any other changes to her medication at this time.  We will continue to monitor.     Schizophrenia: -Start Risperdal  1 mg QHS for Psychosis -Continue Gabapentin  300 mg TID for anxiety -Continue Agitation  Protocol: Haldol /Ativan /Benadryl      Nicotine  Dependence: -Continue Nicotine  Patch 14 mg daily     -Continue Melatonin 5 mg QHS for Insomnia -Change Seroquel  50 mg to QHS PRN for Insomnia -Continue PRN's: Tylenol , Maalox, Atarax , Milk of Magnesia, Trazodone   I certify that inpatient services furnished can reasonably be expected to improve the patient's condition.   Marsa GORMAN Rosser, MD 11/08/2023, 2:30 PM

## 2023-11-08 NOTE — Progress Notes (Signed)
   11/08/23 0800  Psych Admission Type (Psych Patients Only)  Admission Status Involuntary  Psychosocial Assessment  Patient Complaints Anxiety;Confusion;Depression  Eye Contact Suspiciousness;Brief  Facial Expression Flat  Affect Anxious;Depressed;Inconsistent with thought content  Speech Soft  Interaction Guarded;Poor  Motor Activity Fidgety  Appearance/Hygiene Disheveled  Behavior Characteristics Restless  Mood Depressed;Anxious  Thought Process  Coherency Loose associations  Content Phobias;Preoccupation  Delusions Paranoid  Perception WDL  Hallucination None reported or observed  Judgment Poor  Confusion Mild  Danger to Self  Current suicidal ideation? Denies  Agreement Not to Harm Self Yes  Description of Agreement Verbal  Danger to Others  Danger to Others None reported or observed

## 2023-11-08 NOTE — Progress Notes (Signed)
 Oral PRN of haldol 5 mg and benadryl 50 mg given to patient for restlessness, anxiety and agitation. Patient tolerated medication well with no side effect noted. No acute distress noted at this time. Staff will continue to provide support to patient.

## 2023-11-08 NOTE — H&P (Signed)
 Psychiatric Admission Assessment Adult  Patient Identification: Barbara Gordon MRN:  [id number] Date of Evaluation:  11/08/2023 Chief Complaint:  MDD (major depressive disorder), recurrent episode, severe (HCC) [F33.2] Principal Diagnosis: Disorganized schizophrenia, chronic condition (HCC) Diagnosis:  Principal Problem:   Disorganized schizophrenia, chronic condition (HCC)  History of Present Illness:  Barbara Gordon is a 32 yr old female who presented on 12/27 to [facility] after a Suicide Attempt (OD- Seroquel , Gabapentin ) and bizarre behavior, she was admitted to [facility] on 12/31.  PPHx is significant for Schizophrenia, MDD, Borderline Personality Disorder, and a history of Polysubstance Abuse (Amphetamines, Opioids, THC), and Multiple Prior Suicide Attempts (OD) and Multiple Prior Psychiatric Hospitalizations (last- [facility] 11/2022).  Last what led to her hospitalization she reports I do not know.  She states multiple times I am not feeling right.  When asked what symptoms she is having she reports she does not know.  When asked about prior psychiatric diagnoses she reports she does not have any.  When asked if she has ever been admitted to a psychiatric hospital or been on psychiatric medications she reports she does not think so.  She then states she wants to lie down and pulls the covers over her.  The rest of the information had to be obtained from chart review.  Associated Signs/Symptoms: Depression Symptoms:   Could Not Assess (Hypo) Manic Symptoms:   Could Not Assess Anxiety Symptoms:   Could Not Assess Psychotic Symptoms:  Hallucinations: Appears to be responding to internal stimuli PTSD Symptoms: Could Not Assess Total Time spent with patient: 45 minutes  Past Psychiatric History: Schizophrenia, MDD, Borderline Personality Disorder, and a history of Polysubstance Abuse (Amphetamines, Opioids, THC), and Multiple Prior Suicide Attempts (OD) and Multiple Prior Psychiatric Hospitalizations  (last- [facility] 11/2022).  Is the patient at risk to self? Yes.    Has the patient been a risk to self in the past 6 months? Yes.    Has the patient been a risk to self within the distant past? Yes.    Is the patient a risk to others? No.  Has the patient been a risk to others in the past 6 months? No.  Has the patient been a risk to others within the distant past? No.   [place]ia Scale:  Flowsheet Row [facility] (Current[facility] Most recent reading at 11/07/2023 10:00 PM ED to Hosp-Admission (Discharged) from 11/04/2023 in [facility] 4TH FLOOR PROGRESSIVE CARE AND UROLOGY Most recent reading at 11/06/2023  4:33 AM ED from 11/04/2023 in [facility] Most recent reading at 11/04/2023  8:17 PM  C-SSRS RISK CATEGORY Low Risk High Risk Low Risk        Prior Inpatient Therapy: Yes.   If yes, describe [facility] 11/2022  Prior Outpatient Therapy: No. If yes, describe N/A   Alcohol Screening: 1. How often do you have a drink containing alcohol?: Monthly or less 2. How many drinks containing alcohol do you have on a typical day when you are drinking?: 1 or 2 3. How often do you have six or more drinks on one occasion?: Never AUDIT-C Score: 1 4. How often during the last year have you found that you were not able to stop drinking once you had started?: Never 5. How often during the last year have you failed to do what was normally expected from you because of drinking?: Never 6. How often during the last year have you needed a first drink in the  morning to get yourself going after a heavy drinking session?: Never 7. How often during the last year have you had a feeling of guilt of remorse after drinking?: Never 8. How often during the last year have you been unable to remember what happened the night before because you had been drinking?: Never 9. Have you or someone else been injured as a result of your drinking?:  No 10. Has a relative or friend or a doctor or another health worker been concerned about your drinking or suggested you cut down?: No Alcohol Use Disorder Identification Test Final Score (AUDIT): 1 Alcohol Brief Interventions/Follow-up: Alcohol education/Brief advice Substance Abuse History in the last 12 months:  Yes.   Consequences of Substance Abuse: Medical Consequences:  Hospitalizations Previous Psychotropic Medications: Yes  Seroquel , Zyprexa  Psychological Evaluations: No  Past Medical History:  Past Medical History:  Diagnosis Date   Hypothyroidism    Polysubstance abuse (HCC)    Schizophrenia (HCC)    History reviewed. No pertinent surgical history. Family History: History reviewed. No pertinent family history. Family Psychiatric  History:  Mother- Bipolar Disorder  Tobacco Screening:  Social History   Tobacco Use  Smoking Status Some Days   Current packs/day: 1.00   Types: Cigarettes, E-cigarettes  Smokeless Tobacco Never    BH Tobacco Counseling     Are you interested in Tobacco Cessation Medications?  No, patient refused Counseled patient on smoking cessation:  Refused/Declined practical counseling Reason Tobacco Screening Not Completed: Patient Refused Screening       Social History:  Social History   Substance and Sexual Activity  Alcohol Use Not Currently   Comment: denies     Social History   Substance and Sexual Activity  Drug Use Not Currently   Types: Cocaine, Amphetamines, Methamphetamines, Marijuana   Comment: denies    Additional Social History:                           Allergies:   Allergies  Allergen Reactions   Latex Itching and Rash   Tramadol Itching and Other (See Comments)    Provider: Nana Carrier CFM - Allergy Description: TraMADol HCl *ANALGESICS - OPIOID* CFM - Allergy Annotation: Pruritus.    Lab Results:  Results for orders placed or performed during the hospital encounter of 11/04/23 (from the past 48  hours)  GC/Chlamydia probe amp ([place])not at [facility]     Status: None   Collection Time: 11/06/23  5:10 PM  Result Value Ref Range   Neisseria Gonorrhea Negative    Chlamydia Negative    Comment Normal Reference Ranger Chlamydia - Negative    Comment      Normal Reference Range Neisseria Gonorrhea - Negative  HIV Antibody (routine testing w rflx)     Status: None   Collection Time: 11/07/23  3:40 AM  Result Value Ref Range   HIV Screen 4th Generation wRfx Non Reactive Non Reactive    Comment: Performed at [facility], [address] [place], [place] [address]  RPR     Status: None   Collection Time: 11/07/23  3:40 AM  Result Value Ref Range   RPR Ser Ql NON REACTIVE NON REACTIVE    Comment: Performed at [facility], [address], [place], [place] [address]  Glucose, capillary     Status: None   Collection Time: 11/07/23  7:54 AM  Result Value Ref Range   Glucose-Capillary 98 70 - 99  mg/dL    Comment: Glucose reference range applies only to samples taken after fasting for at least 8 hours.    Blood Alcohol level:  Lab Results  Component Value Date   ETH <10 11/04/2023   ETH <10 10/27/2023    Metabolic Disorder Labs:  Lab Results  Component Value Date   HGBA1C 4.7 (L) 04/18/2022   MPG 88.19 04/18/2022   MPG 97 01/15/2022   Lab Results  Component Value Date   PROLACTIN 189.0 (H) 04/16/2022   PROLACTIN 2.6 (L) 01/15/2022   Lab Results  Component Value Date   CHOL 171 01/15/2022   TRIG 10 01/15/2022   HDL 36 (L) 01/15/2022   CHOLHDL 4.8 01/15/2022   VLDL 2 01/15/2022   LDLCALC 133 (H) 01/15/2022   LDLCALC 131 (H) 10/09/2021    Current Medications: Current Facility-Administered Medications  Medication Dose Route Frequency Provider Last Rate Last Admin   acetaminophen  (TYLENOL ) tablet 650 mg  650 mg Oral Q6H PRN Starkes-Perry, Takia S, FNP       alum & mag hydroxide-simeth (MAALOX/MYLANTA) 200-200-20 MG/5ML suspension 30 mL  30 mL Oral Q4H PRN  Starkes-Perry, Majel RAMAN, FNP       haloperidol  (HALDOL ) tablet 5 mg  5 mg Oral TID PRN Johny Lot, MD   5 mg at 11/08/23 1426   And   diphenhydrAMINE  (BENADRYL ) capsule 50 mg  50 mg Oral TID PRN Johny Lot, MD   50 mg at 11/08/23 1426   haloperidol  lactate (HALDOL ) injection 5 mg  5 mg Intramuscular TID PRN Johny Lot, MD       And   diphenhydrAMINE  (BENADRYL ) injection 50 mg  50 mg Intramuscular TID PRN Massengill, Lot, MD       And   LORazepam  (ATIVAN ) injection 2 mg  2 mg Intramuscular TID PRN Massengill, Lot, MD       haloperidol  lactate (HALDOL ) injection 10 mg  10 mg Intramuscular TID PRN Massengill, Lot, MD       And   diphenhydrAMINE  (BENADRYL ) injection 50 mg  50 mg Intramuscular TID PRN Massengill, Lot, MD       And   LORazepam  (ATIVAN ) injection 2 mg  2 mg Intramuscular TID PRN Massengill, Lot, MD       gabapentin  (NEURONTIN ) capsule 300 mg  300 mg Oral QHS Wilkie Majel RAMAN, FNP   300 mg at 11/07/23 2239   hydrOXYzine  (ATARAX ) tablet 25 mg  25 mg Oral TID PRN Wilkie Majel RAMAN, FNP   25 mg at 11/08/23 9262   magnesium  hydroxide (MILK OF MAGNESIA) suspension 30 mL  30 mL Oral Daily PRN Wilkie Majel RAMAN, FNP       melatonin tablet 5 mg  5 mg Oral QHS Wilkie Majel RAMAN, FNP   5 mg at 11/07/23 2239   nicotine  (NICODERM CQ  - dosed in mg/24 hours) patch 14 mg  14 mg Transdermal Daily Wilkie Majel RAMAN, FNP   14 mg at 11/08/23 0820   QUEtiapine  (SEROQUEL ) tablet 50 mg  50 mg Oral QHS PRN Hamlet Lasecki S, MD       risperiDONE  (RISPERDAL  M-TABS) disintegrating tablet 1 mg  1 mg Oral QHS Lamia Mariner, Marsa RAMAN, MD       PTA Medications: Medications Prior to Admission  Medication Sig Dispense Refill Last Dose/Taking   gabapentin  (NEURONTIN ) 100 MG capsule Take 3 capsules (300 mg total) by mouth at bedtime. 60 capsule 0    OLANZapine  (ZYPREXA ) 5 MG tablet Take 5 mg by mouth  at bedtime.      QUEtiapine  (SEROQUEL ) 50 MG  tablet Take 1 tablet (50 mg total) by mouth at bedtime. 30 tablet 0     Musculoskeletal: Strength & Muscle Tone: within normal limits Gait & Station: normal Patient leans: N/A            Psychiatric Specialty Exam:  Presentation  General Appearance:  Disheveled  Eye Contact: Fair (minimal blinking)  Speech: Blocked; Slow  Speech Volume: Decreased  Handedness: Right   Mood and Affect  Mood: Dysphoric  Affect: Flat; Constricted   Thought Process  Thought Processes: Disorganized; Irrevelant  Duration of Psychotic Symptoms: greater than 6 months Past Diagnosis of Schizophrenia or Psychoactive disorder: Yes  Descriptions of Associations:Loose  Orientation:None  Thought Content:Delusions  Hallucinations:Hallucinations: -- (Did not answer)  Ideas of Reference:None  Suicidal Thoughts:Suicidal Thoughts: No  Homicidal Thoughts:Homicidal Thoughts: No   Sensorium  Memory: Immediate Poor  Judgment: Impaired  Insight: Lacking   Executive Functions  Concentration: Poor  Attention Span: Poor  Recall: Poor  Fund of Knowledge: Poor  Language: Poor   Psychomotor Activity  Psychomotor Activity: Psychomotor Activity: Decreased   Assets  Assets: Resilience   Sleep  Sleep: Sleep: Fair Number of Hours of Sleep: 6    Physical Exam: Physical Exam Vitals and nursing note reviewed.  Constitutional:      General: She is not in acute distress.    Appearance: She is normal weight. She is not ill-appearing or toxic-appearing.  HENT:     Head: Normocephalic and atraumatic.  Pulmonary:     Effort: Pulmonary effort is normal.  Musculoskeletal:        General: Normal range of motion.  Neurological:     General: No focal deficit present.     Mental Status: She is alert.    Review of Systems  Unable to perform ROS: Psychiatric disorder   Blood pressure (!) 148/109, pulse 97, temperature 98.5 F (36.9 C), temperature source  Oral, resp. rate 18, height 5' 1 (1.549 m), weight 54.4 kg, last menstrual period 11/04/2023, SpO2 100%. Body mass index is 22.67 kg/m.  Treatment Plan Summary: Daily contact with patient to assess and evaluate symptoms and progress in treatment and Medication management  Azariya Freeman is a 32 yr old female who presented on 12/27 to [facility] after a Suicide Attempt (OD- Seroquel , Gabapentin ) and bizarre behavior, she was admitted to [facility] on 12/31.  PPHx is significant for Schizophrenia, MDD, Borderline Personality Disorder, and a history of Polysubstance Abuse (Amphetamines, Opioids, THC), and Multiple Prior Suicide Attempts (OD) and Multiple Prior Psychiatric Hospitalizations (last- [facility] 11/2022).    Isaac is current very disorganized and psychotic, alternating between pacing up and down the unit and laying down in bed.  As has significant doses of Seroquel  would be needed to address her schizophrenia we will change this to Risperdal  as it is more potent, can be increased quicker, and does give the option of an LAI.  We will keep Seroquel  as needed low dose for sleep.  We will order a urine Preg, lipid panel, and A1c.  We will not make any other changes to her medication at this time.  We will continue to monitor.   Schizophrenia: -Start Risperdal  1 mg QHS for Psychosis -Continue Gabapentin  300 mg TID for anxiety -Continue Agitation Protocol: Haldol /Ativan /Benadryl    Nicotine  Dependence: -Continue Nicotine  Patch 14 mg daily   -Continue Melatonin 5 mg QHS for Insomnia -Change Seroquel  50 mg to QHS PRN for Insomnia -Continue PRN's:  Tylenol , Maalox, Atarax , Milk of Magnesia, Trazodone    Observation Level/Precautions:  15 minute checks  Laboratory:  CMP: WNL except CO2: 18,  CBC: WNL except Hem: 15.1, HIV: Neg, EKG: Sinus Rhythm with Qtc: 415. Lipid Panel, A1c, Urine Preg ordered  Psychotherapy:    Medications:  Risperdal   Consultations:    Discharge Concerns:    Estimated LOS: 7-10 days   Other:     Physician Treatment Plan for Primary Diagnosis: Disorganized schizophrenia, chronic condition (HCC) Long Term Goal(s): Improvement in symptoms so as ready for discharge  Short Term Goals: Ability to maintain clinical measurements within normal limits will improve, Compliance with prescribed medications will improve, and Ability to identify triggers associated with substance abuse/mental health issues will improve  Physician Treatment Plan for Secondary Diagnosis: Principal Problem:   Disorganized schizophrenia, chronic condition (HCC)  Long Term Goal(s): Improvement in symptoms so as ready for discharge  Short Term Goals: Ability to maintain clinical measurements within normal limits will improve, Compliance with prescribed medications will improve, and Ability to identify triggers associated with substance abuse/mental health issues will improve  I certify that inpatient services furnished can reasonably be expected to improve the patient's condition.    Marsa GORMAN Rosser, MD 12/31/20242:28 PM

## 2023-11-08 NOTE — Progress Notes (Signed)
   11/08/23 0555  15 Minute Checks  Location Bedroom  Visual Appearance Calm  Behavior Sleeping  Sleep (Behavioral Health Patients Only)  Calculate sleep? (Click Yes once per 24 hr at 0600 safety check) Yes  Documented sleep last 24 hours 6.75

## 2023-11-08 NOTE — Group Note (Signed)
 Recreation Therapy Group Note   Group Topic:Animal Assisted Therapy   Group Date: 11/08/2023 Start Time: 0946 End Time: 1030 Facilitators: Lesleyann Fichter-McCall, LRT,CTRS Location: [address] Dayroom   Animal-Assisted Activity (AAA) Program Checklist/Progress Notes Patient Eligibility Criteria Checklist & Daily Group note for Rec Tx Intervention  AAA/T Program Assumption of Risk Form signed by Patient/ or Parent Legal Guardian Yes  Patient is free of allergies or severe asthma Yes  Patient reports no fear of animals Yes  Patient reports no history of cruelty to animals Yes  Patient understands his/her participation is voluntary Yes  Patient washes hands before animal contact Yes  Patient washes hands after animal contact Yes  Education: Hand Washing, Appropriate Animal Interaction   Education Outcome: Acknowledges education.    Affect/Mood: Euthymic   Participation Level: None   Participation Quality: None   Behavior: Bizarre and Suspicious   Speech/Thought Process: Barely audible    Insight: Lacking   Judgement: Lacking    Modes of Intervention: Teaching Laboratory Technician   Patient Response to Interventions:  Disengaged   Education Outcome:  In group clarification offered    Clinical Observations/Individualized Feedback: Pt didn't participate in activity. Pt stood the entire time rocking from side to side stroking her hair. Pt appeared confused and lost. Pt seemed zoned out in her own world.     Plan: Continue to engage patient in RT group sessions 2-3x/week.   Jary Louvier-McCall, LRT,CTRS 11/08/2023 12:25 PM

## 2023-11-08 NOTE — BHH Counselor (Addendum)
 Adult Comprehensive Assessment  Patient ID: Barbara Gordon, female   DOB: Jun 23, 1991, 33 y.o.   MRN: [id number]  Attempted to compete assessment 11/08/23 @ 11:00AM. Pt stated I don't think so I am unsure what is going on. I am going to go lay down. Pt appeared confused.  CSW will make another attempt.     Jenkins LULLA Primer. 11/08/2023

## 2023-11-09 ENCOUNTER — Encounter (HOSPITAL_COMMUNITY): Payer: Self-pay

## 2023-11-09 DIAGNOSIS — F201 Disorganized schizophrenia: Secondary | ICD-10-CM | POA: Diagnosis not present

## 2023-11-09 MED ORDER — ONDANSETRON 4 MG PO TBDP
4.0000 mg | ORAL_TABLET | Freq: Four times a day (QID) | ORAL | Status: DC | PRN
  Administered 2023-11-09: 4 mg via ORAL
  Filled 2023-11-09: qty 1

## 2023-11-09 MED ORDER — ONDANSETRON 4 MG PO TBDP
ORAL_TABLET | ORAL | Status: AC
  Filled 2023-11-09: qty 1

## 2023-11-09 NOTE — BH IP Treatment Plan (Signed)
 Interdisciplinary Treatment and Diagnostic Plan Update  11/09/2023 Time of Session: 11:30 AM Barbara Gordon MRN: [id number]  Principal Diagnosis: Disorganized schizophrenia, chronic condition (HCC)  Secondary Diagnoses: Principal Problem:   Disorganized schizophrenia, chronic condition (HCC)   Current Medications:  Current Facility-Administered Medications  Medication Dose Route Frequency Provider Last Rate Last Admin   acetaminophen  (TYLENOL ) tablet 650 mg  650 mg Oral Q6H PRN Starkes-Perry, Takia S, FNP       alum & mag hydroxide-simeth (MAALOX/MYLANTA) 200-200-20 MG/5ML suspension 30 mL  30 mL Oral Q4H PRN Starkes-Perry, Takia S, FNP       haloperidol  (HALDOL ) tablet 5 mg  5 mg Oral TID PRN Johny Lot, MD   5 mg at 11/08/23 1426   And   diphenhydrAMINE  (BENADRYL ) capsule 50 mg  50 mg Oral TID PRN Johny Lot, MD   50 mg at 11/08/23 1426   haloperidol  lactate (HALDOL ) injection 5 mg  5 mg Intramuscular TID PRN Johny Lot, MD       And   diphenhydrAMINE  (BENADRYL ) injection 50 mg  50 mg Intramuscular TID PRN Massengill, Lot, MD       And   LORazepam  (ATIVAN ) injection 2 mg  2 mg Intramuscular TID PRN Massengill, Lot, MD       haloperidol  lactate (HALDOL ) injection 10 mg  10 mg Intramuscular TID PRN Massengill, Lot, MD       And   diphenhydrAMINE  (BENADRYL ) injection 50 mg  50 mg Intramuscular TID PRN Massengill, Lot, MD       And   LORazepam  (ATIVAN ) injection 2 mg  2 mg Intramuscular TID PRN Massengill, Lot, MD       gabapentin  (NEURONTIN ) capsule 300 mg  300 mg Oral QHS Starkes-Perry, Takia S, FNP   300 mg at 11/08/23 2100   hydrOXYzine  (ATARAX ) tablet 25 mg  25 mg Oral TID PRN Starkes-Perry, Takia S, FNP   25 mg at 11/09/23 0936   magnesium  hydroxide (MILK OF MAGNESIA) suspension 30 mL  30 mL Oral Daily PRN Starkes-Perry, Takia S, FNP       melatonin tablet 5 mg  5 mg Oral QHS Starkes-Perry, Takia S, FNP   5 mg at 11/08/23 2100   nicotine   (NICODERM CQ  - dosed in mg/24 hours) patch 14 mg  14 mg Transdermal Daily Wilkie Majel RAMAN, FNP   14 mg at 11/09/23 0919   ondansetron  (ZOFRAN -ODT) 4 MG disintegrating tablet            QUEtiapine  (SEROQUEL ) tablet 50 mg  50 mg Oral QHS PRN Pashayan, Alexander S, MD       risperiDONE  (RISPERDAL  M-TABS) disintegrating tablet 1 mg  1 mg Oral QHS Pashayan, Alexander S, MD   1 mg at 11/08/23 2100   PTA Medications: Medications Prior to Admission  Medication Sig Dispense Refill Last Dose/Taking   gabapentin  (NEURONTIN ) 100 MG capsule Take 3 capsules (300 mg total) by mouth at bedtime. 60 capsule 0    OLANZapine  (ZYPREXA ) 5 MG tablet Take 5 mg by mouth at bedtime.      QUEtiapine  (SEROQUEL ) 50 MG tablet Take 1 tablet (50 mg total) by mouth at bedtime. 30 tablet 0     Patient Stressors: Medication change or noncompliance   Other: Patient unable to identify any stressors    Patient Strengths: Forensic Psychologist fund of knowledge  Motivation for treatment/growth  Supportive family/friends   Treatment Modalities: Medication Management, Group therapy, Case management,  1 to 1 session with clinician, Psychoeducation,  Recreational therapy.   Physician Treatment Plan for Primary Diagnosis: Disorganized schizophrenia, chronic condition (HCC) Long Term Goal(s): Improvement in symptoms so as ready for discharge   Short Term Goals: Ability to maintain clinical measurements within normal limits will improve Compliance with prescribed medications will improve Ability to identify triggers associated with substance abuse/mental health issues will improve  Medication Management: Evaluate patient's response, side effects, and tolerance of medication regimen.  Therapeutic Interventions: 1 to 1 sessions, Unit Group sessions and Medication administration.  Evaluation of Outcomes: Not Progressing  Physician Treatment Plan for Secondary Diagnosis: Principal Problem:   Disorganized  schizophrenia, chronic condition (HCC)  Long Term Goal(s): Improvement in symptoms so as ready for discharge   Short Term Goals: Ability to maintain clinical measurements within normal limits will improve Compliance with prescribed medications will improve Ability to identify triggers associated with substance abuse/mental health issues will improve     Medication Management: Evaluate patient's response, side effects, and tolerance of medication regimen.  Therapeutic Interventions: 1 to 1 sessions, Unit Group sessions and Medication administration.  Evaluation of Outcomes: Not Progressing   RN Treatment Plan for Primary Diagnosis: Disorganized schizophrenia, chronic condition (HCC) Long Term Goal(s): Knowledge of disease and therapeutic regimen to maintain health will improve  Short Term Goals: Ability to remain free from injury will improve, Ability to verbalize frustration and anger appropriately will improve, Ability to demonstrate self-control, Ability to participate in decision making will improve, Ability to verbalize feelings will improve, Ability to disclose and discuss suicidal ideas, Ability to identify and develop effective coping behaviors will improve, and Compliance with prescribed medications will improve  Medication Management: RN will administer medications as ordered by provider, will assess and evaluate patient's response and provide education to patient for prescribed medication. RN will report any adverse and/or side effects to prescribing provider.  Therapeutic Interventions: 1 on 1 counseling sessions, Psychoeducation, Medication administration, Evaluate responses to treatment, Monitor vital signs and CBGs as ordered, Perform/monitor CIWA, COWS, AIMS and Fall Risk screenings as ordered, Perform wound care treatments as ordered.  Evaluation of Outcomes: Not Progressing   LCSW Treatment Plan for Primary Diagnosis: Disorganized schizophrenia, chronic condition  (HCC) Long Term Goal(s): Safe transition to appropriate next level of care at discharge, Engage patient in therapeutic group addressing interpersonal concerns.  Short Term Goals: Engage patient in aftercare planning with referrals and resources, Increase social support, Increase ability to appropriately verbalize feelings, Increase emotional regulation, Facilitate acceptance of mental health diagnosis and concerns, Facilitate patient progression through stages of change regarding substance use diagnoses and concerns, and Identify triggers associated with mental health/substance abuse issues  Therapeutic Interventions: Assess for all discharge needs, 1 to 1 time with Social worker, Explore available resources and support systems, Assess for adequacy in community support network, Educate family and significant other(s) on suicide prevention, Complete Psychosocial Assessment, Interpersonal group therapy.  Evaluation of Outcomes: Not Progressing   Progress in Treatment: Attending groups: Yes. Participating in groups: Minimal, patient was restless and confused.   Taking medication as prescribed: Yes. Toleration medication: Yes. Family/Significant other contact made: consents pending Patient understands diagnosis: Yes. Discussing patient identified problems/goals with staff: Yes. Medical problems stabilized or resolved: Yes. Denies suicidal/homicidal ideation: Yes. Issues/concerns per patient self-inventory: No.  New problem(s) identified:  No  New Short Term/Long Term Goal(s):    medication stabilization, elimination of SI thoughts, development of comprehensive mental wellness plan.    Patient Goals: They said I took too many sleeping pills. I had crazy thoughts.  I thought we were drinking blood.  I thought somebody killed my kid.  I'm staying with my friend and his mom, but he is controlling.  Sometimes he is mean and has anger problems.  They let me stay there for free.  Patient said  that stress related to living in this environment contributed to her symptoms.  She would like to go to a sober living home or a home through Section 8.   Patient said that he has a 32 year old child who stays with his grandma.  I still have custody.  I don't have any income.  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   Discharge Plan or Barriers:  Patient recently admitted. CSW will continue to follow and assess for appropriate referrals and possible discharge planning.    Reason for Continuation of Hospitalization: Hallucinations Medication stabilization Suicidal ideation  Estimated Length of Stay:  5 - 7 days  Last 3 [place] Suicide Severity Risk Score: Flowsheet Row [facility] (Current[facility] Most recent reading at 11/07/2023 10:00 PM ED to Hosp-Admission (Discharged) from 11/04/2023 in [place] LONG 4TH FLOOR PROGRESSIVE CARE AND UROLOGY Most recent reading at 11/06/2023  4:33 AM ED from 11/04/2023 in [facility] Most recent reading at 11/04/2023  8:17 PM  C-SSRS RISK CATEGORY Low Risk High Risk Low Risk       Last PHQ 2/9 Scores:    06/27/2023    3:05 PM 06/06/2023    3:40 PM 07/01/2022    3:49 PM  Depression screen PHQ 2/9  Decreased Interest 0 0 0  Down, Depressed, Hopeless 0 0 0  PHQ - 2 Score 0 0 0  Altered sleeping   0  Tired, decreased energy   0  Change in appetite   0  Feeling bad or failure about yourself    0  Trouble concentrating   0  Moving slowly or fidgety/restless   0  Suicidal thoughts   0  PHQ-9 Score   0    Scribe for Treatment Team: Delores Edelstein O Victorya Hillman, LCSWA 11/09/2023 3:16 PM

## 2023-11-09 NOTE — Group Note (Signed)
 Recreation Therapy Group Note   Group Topic:General Recreation  Group Date: 11/09/2023 Start Time: 0930 End Time: 1005 Facilitators: Itzamar Traynor-McCall, LRT,CTRS Location: 300 [address] Dayroom   Group Topic/Focus: General Recreation   Goal Area(s) Addresses:  Patient will use appropriate interactions with peers.   Patient will choose appropriate songs.  Intervention: Research scientist (medical), Television  Group Description: Karaoke. Patients took turns selecting songs that were appropriate to perform for their enjoyment with peers.      Affect/Mood: Appropriate   Participation Level: Minimal   Participation Quality: Independent   Behavior: Attentive    Speech/Thought Process: Relevant   Insight: Moderate   Judgement: Moderate   Modes of Intervention: Music   Patient Response to Interventions:  Attentive   Education Outcome:  In group clarification offered    Clinical Observations/Individualized Feedback: Pt vibed to some of the songs as peers sang them. Pt was bright and would smile on occasion. Pt was appropriate during group.      Plan: Continue to engage patient in RT group sessions 2-3x/week.   Jun Rightmyer-McCall, LRT,CTRS 11/09/2023 12:22 PM

## 2023-11-09 NOTE — Progress Notes (Signed)
 Patient at breakfast eating a banana.  Patient vomited and returned to unit.  Patient states the banana did not taste "right".  Per patient she feels fine at this time.

## 2023-11-09 NOTE — Plan of Care (Signed)
  Problem: Education: Goal: Knowledge of [place] [facility] Education information/materials will improve Outcome: Progressing Goal: Emotional status will improve Outcome: Progressing Goal: Mental status will improve Outcome: Progressing Goal: Verbalization of understanding the information provided will improve Outcome: Progressing   Problem: Activity: Goal: Interest or engagement in activities will improve Outcome: Progressing Goal: Sleeping patterns will improve Outcome: Progressing   Problem: Coping: Goal: Ability to verbalize frustrations and anger appropriately will improve Outcome: Progressing Goal: Ability to demonstrate self-control will improve Outcome: Progressing   Problem: Health Behavior/Discharge Planning: Goal: Identification of resources available to assist in meeting health care needs will improve Outcome: Progressing Goal: Compliance with treatment plan for underlying cause of condition will improve Outcome: Progressing   Problem: Physical Regulation: Goal: Ability to maintain clinical measurements within normal limits will improve Outcome: Progressing   Problem: Safety: Goal: Periods of time without injury will increase Outcome: Progressing   Problem: Education: Goal: Ability to make informed decisions regarding treatment will improve Outcome: Progressing   Problem: Coping: Goal: Coping ability will improve Outcome: Progressing   Problem: Health Behavior/Discharge Planning: Goal: Identification of resources available to assist in meeting health care needs will improve Outcome: Progressing   Problem: Medication: Goal: Compliance with prescribed medication regimen will improve Outcome: Progressing   Problem: Self-Concept: Goal: Ability to disclose and discuss suicidal ideas will improve Outcome: Progressing Goal: Will verbalize positive feelings about self Outcome: Progressing Note: Patient is not on track and improving. Patient will maintain  adherence

## 2023-11-09 NOTE — Progress Notes (Signed)
 [facility] MD Progress Note  11/09/2023 4:54 PM Barbara Gordon  MRN:  [id number]  Reason for admission: 33 yr old female who presented on 12/27 to [facility] after a Suicide Attempt (OD- Seroquel , Gabapentin ) and bizarre behavior, she was admitted to [facility] on 12/31.  PPHx is significant for Schizophrenia, MDD, Borderline Personality Disorder, and a history of Polysubstance Abuse (Amphetamines, Opioids, THC), and Multiple Prior Suicide Attempts (OD) and Multiple Prior Psychiatric Hospitalizations (last- [facility] 11/2022).   Daily notes: Tejasvi is seen in her room. She is lying down in bed. She presents alert, oriented & aware of situation. She presents fairly groomed, making good eye contact. Her affect is good/reactive. Had just finished showering prior to this evaluation. She presents calm, appears rested. She reports,  I guess I was brought to this hospital because I was feeling suicidal. I was told that I overdosed on my sleeping pills. But, I don't think I intentionally overdosed, rather I may have taken too much of my sleeping pills to help me sleep better. I was not using drugs, I smoke a little weed here & there. I'm not feeling depressed, just a little anxious because I do not have any place to stay after discharge. I will need you guys to help me get into a sober living environment or section-8 housing after discharge. Kameah presents civil during this evaluation. She currently denies any SIHI, AVH, delusional thoughts or paranoia. She does not appear to be responding to any internal stimuli. When discussing this case with the attending, he stated that patient on admission yesterday was highly anxious, disorganized & pacing along the hallway, forth & back. That patient did have a serious overdose incident & was at the medical floor for an extended amount of days prior to her transfer to this hospital. From my observation today during this evaluation, it appears patient has made a remarkable improvement in her symptoms  between yesterday & today. Her vital signs are stable. There are no changes made on her current plan of care. Will continue as already in progress.   Principal Problem: Disorganized schizophrenia, chronic condition (HCC)  Diagnosis: Principal Problem:   Disorganized schizophrenia, chronic condition (HCC)  Total Time spent with patient:  35 minutes  Past Psychiatric History: See H&P.  Past Medical History:  Past Medical History:  Diagnosis Date   Hypothyroidism    Polysubstance abuse (HCC)    Schizophrenia (HCC)    History reviewed. No pertinent surgical history.  Family History: History reviewed. No pertinent family history.  Family Psychiatric  History: See H&P.  Social History:  Social History   Substance and Sexual Activity  Alcohol Use Not Currently   Comment: denies     Social History   Substance and Sexual Activity  Drug Use Not Currently   Types: Cocaine, Amphetamines, Methamphetamines, Marijuana   Comment: denies    Social History   Socioeconomic History   Marital status: Single    Spouse name: Not on file   Number of children: Not on file   Years of education: Not on file   Highest education level: Not on file  Occupational History   Not on file  Tobacco Use   Smoking status: Some Days    Current packs/day: 1.00    Types: Cigarettes, E-cigarettes   Smokeless tobacco: Never  Vaping Use   Vaping status: Never Used  Substance and Sexual Activity   Alcohol use: Not Currently    Comment: denies   Drug use: Not Currently  Types: Cocaine, Amphetamines, Methamphetamines, Marijuana    Comment: denies   Sexual activity: Not Currently  Other Topics Concern   Not on file  Social History Narrative   Not on file   Social Drivers of Health   Financial Resource Strain: Low Risk  (11/20/2022)   Received from [facility]   Overall Financial Resource Strain (CARDIA)    Difficulty of Paying Living Expenses: Not very hard  Food Insecurity: Patient Unable  To Answer (11/07/2023)   Hunger Vital Sign    Worried About Running Out of Food in the Last Year: Patient unable to answer    Ran Out of Food in the Last Year: Patient unable to answer  Transportation Needs: Patient Unable To Answer (11/07/2023)   PRAPARE - Transportation    Lack of Transportation (Medical): Patient unable to answer    Lack of Transportation (Non-Medical): Patient unable to answer  Recent Concern: Transportation Needs - Unmet Transportation Needs (11/06/2023)   PRAPARE - Transportation    Lack of Transportation (Medical): Yes    Lack of Transportation (Non-Medical): Yes  Physical Activity: Not on file  Stress: No Stress Concern Present (11/20/2022)   Received from [facility] - Occupational Stress Questionnaire    Feeling of Stress : Only a little  Social Connections: Unknown (03/09/2022)   Received from [facility]   Social Network    Social Network: Not on file   Additional Social History:   Sleep: Good  Appetite:  Good  Current Medications: Current Facility-Administered Medications  Medication Dose Route Frequency Provider Last Rate Last Admin   acetaminophen  (TYLENOL ) tablet 650 mg  650 mg Oral Q6H PRN Starkes-Perry, Takia S, FNP       alum & mag hydroxide-simeth (MAALOX/MYLANTA) 200-200-20 MG/5ML suspension 30 mL  30 mL Oral Q4H PRN Starkes-Perry, Takia S, FNP       haloperidol  (HALDOL ) tablet 5 mg  5 mg Oral TID PRN Johny Lot, MD   5 mg at 11/08/23 1426   And   diphenhydrAMINE  (BENADRYL ) capsule 50 mg  50 mg Oral TID PRN Johny Lot, MD   50 mg at 11/08/23 1426   haloperidol  lactate (HALDOL ) injection 5 mg  5 mg Intramuscular TID PRN Massengill, Lot, MD       And   diphenhydrAMINE  (BENADRYL ) injection 50 mg  50 mg Intramuscular TID PRN Massengill, Lot, MD       And   LORazepam  (ATIVAN ) injection 2 mg  2 mg Intramuscular TID PRN Massengill, Lot, MD       haloperidol   lactate (HALDOL ) injection 10 mg  10 mg Intramuscular TID PRN Massengill, Lot, MD       And   diphenhydrAMINE  (BENADRYL ) injection 50 mg  50 mg Intramuscular TID PRN Massengill, Lot, MD       And   LORazepam  (ATIVAN ) injection 2 mg  2 mg Intramuscular TID PRN Massengill, Lot, MD       gabapentin  (NEURONTIN ) capsule 300 mg  300 mg Oral QHS Starkes-Perry, Takia S, FNP   300 mg at 11/08/23 2100   hydrOXYzine  (ATARAX ) tablet 25 mg  25 mg Oral TID PRN Starkes-Perry, Takia S, FNP   25 mg at 11/09/23 0936   magnesium  hydroxide (MILK OF MAGNESIA) suspension 30 mL  30 mL Oral Daily PRN Starkes-Perry, Takia S, FNP       melatonin tablet 5 mg  5 mg Oral QHS Starkes-Perry, Takia S, FNP   5 mg  at 11/08/23 2100   nicotine  (NICODERM CQ  - dosed in mg/24 hours) patch 14 mg  14 mg Transdermal Daily Wilkie Majel RAMAN, FNP   14 mg at 11/09/23 9080   QUEtiapine  (SEROQUEL ) tablet 50 mg  50 mg Oral QHS PRN Pashayan, Alexander S, MD       risperiDONE  (RISPERDAL  M-TABS) disintegrating tablet 1 mg  1 mg Oral QHS Pashayan, Alexander S, MD   1 mg at 11/08/23 2100    Lab Results: No results found for this or any previous visit (from the past 48 hours).  Blood Alcohol level:  Lab Results  Component Value Date   ETH <10 11/04/2023   ETH <10 10/27/2023    Metabolic Disorder Labs: Lab Results  Component Value Date   HGBA1C 4.7 (L) 04/18/2022   MPG 88.19 04/18/2022   MPG 97 01/15/2022   Lab Results  Component Value Date   PROLACTIN 189.0 (H) 04/16/2022   PROLACTIN 2.6 (L) 01/15/2022   Lab Results  Component Value Date   CHOL 171 01/15/2022   TRIG 10 01/15/2022   HDL 36 (L) 01/15/2022   CHOLHDL 4.8 01/15/2022   VLDL 2 01/15/2022   LDLCALC 133 (H) 01/15/2022   LDLCALC 131 (H) 10/09/2021    Physical Findings: AIMS:  , ,  ,  ,    CIWA:    COWS:     Musculoskeletal: Strength & Muscle Tone: within normal limits Gait & Station: normal Patient leans: N/A  Psychiatric Specialty  Exam:  Presentation  General Appearance:  Casual; Fairly Groomed  Eye Contact: Good (minimal blinking)  Speech: Clear and Coherent; Normal Rate  Speech Volume: Normal  Handedness: Right   Mood and Affect  Mood: -- (Some improvement)  Affect: Congruent   Thought Process  Thought Processes: Coherent  Descriptions of Associations:Intact  Orientation:Full (Time, Place and Person)  Thought Content:Logical  History of Schizophrenia/Schizoaffective disorder:Yes  Duration of Psychotic Symptoms:Greater than six months  Hallucinations:Hallucinations: None (Did not answer)  Ideas of Reference:None  Suicidal Thoughts:Suicidal Thoughts: No  Homicidal Thoughts:Homicidal Thoughts: No   Sensorium  Memory: Immediate Good; Recent Fair; Remote Fair  Judgment: Fair  Insight: Fair   Art Therapist  Concentration: Fair  Attention Span: Fair  Recall: Fair  Fund of Knowledge: Fair  Language: Good  Psychomotor Activity  Psychomotor Activity: Psychomotor Activity: Normal  Assets  Assets: Resilience; Communication Skills; Desire for Improvement  Sleep  Sleep: Sleep: Good Number of Hours of Sleep: 7  Physical Exam: Physical Exam Vitals and nursing note reviewed.  HENT:     Nose: Nose normal.  Cardiovascular:     Rate and Rhythm: Normal rate.     Pulses: Normal pulses.  Pulmonary:     Effort: Pulmonary effort is normal.  Genitourinary:    Comments: Deferred Musculoskeletal:        General: Normal range of motion.     Cervical back: Normal range of motion.  Skin:    General: Skin is warm and dry.  Neurological:     General: No focal deficit present.     Mental Status: She is oriented to person, place, and time.    Review of Systems  Constitutional:  Negative for chills and fever.  Respiratory:  Negative for cough, shortness of breath and wheezing.   Cardiovascular:  Negative for chest pain and palpitations.  Gastrointestinal:   Negative for abdominal pain, constipation, diarrhea, heartburn, nausea and vomiting.  Musculoskeletal:  Negative for joint pain and myalgias.  Neurological:  Negative for  dizziness, tingling, tremors, sensory change, speech change, focal weakness, seizures, loss of consciousness, weakness and headaches.  Psychiatric/Behavioral:  Positive for substance abuse.    Blood pressure 124/76, pulse (!) 101, temperature 98.3 F (36.8 C), temperature source Oral, resp. rate 18, height 5' 1 (1.549 m), weight 54.4 kg, last menstrual period 11/04/2023, SpO2 99%. Body mass index is 22.67 kg/m.  Treatment Plan Summary: Daily contact with patient to assess and evaluate symptoms and progress in treatment and Medication management.   Continue inpatient hospitalization.  Will continue today 11/09/2023 plan as below except where it is noted.   Schizophrenia: -Continue Risperdal  1 mg QHS for Psychosis -Continue Gabapentin  300 mg TID for anxiety -Continue Agitation Protocol: Haldol /Ativan /Benadryl .  -Continue Melatonin po q hs for insomnia.  -Continue Seroquel  50 mg po Q hs prn for insomnia.   Nicotine  Dependence: -Continue Nicotine  Patch 14 mg daily   -Continue Melatonin 5 mg QHS for Insomnia -Change Seroquel  50 mg to QHS PRN for Insomnia -Continue PRN's: Tylenol , Maalox, Atarax , Milk of Magnesia, Trazodone   Mac Bolster, NP, pmhnp, fnp-bc. 11/09/2023, 4:54 PM

## 2023-11-09 NOTE — [facility] Group Notes (Signed)
 [facility] Group Notes:  (Nursing/MHT/Case Management/Adjunct)  Date:  11/09/2023  Time:  8:24 PM  Type of Therapy:   NA GROUP  Participation Level:  Did Not Attend   Barbara Gordon 11/09/2023, 8:24 PM
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
## 2023-11-10 DIAGNOSIS — F201 Disorganized schizophrenia: Secondary | ICD-10-CM | POA: Diagnosis not present

## 2023-11-10 LAB — LIPID PANEL
Cholesterol: 213 mg/dL — ABNORMAL HIGH (ref 0–200)
HDL: 52 mg/dL (ref >40)
LDL Cholesterol: 143 mg/dL — ABNORMAL HIGH (ref 0–99)
Total CHOL/HDL Ratio: 4.1 {ratio}
Triglycerides: 90 mg/dL (ref <150)
VLDL: 18 mg/dL (ref 0–40)

## 2023-11-10 MED ORDER — PNEUMOCOCCAL 20-VAL CONJ VACC 0.5 ML IM SUSY
0.5000 mL | PREFILLED_SYRINGE | INTRAMUSCULAR | Status: AC
  Administered 2023-11-11: 0.5 mL via INTRAMUSCULAR
  Filled 2023-11-10: qty 0.5

## 2023-11-10 NOTE — Plan of Care (Signed)
  Problem: Education: Goal: Emotional status will improve Outcome: Progressing   Problem: Activity: Goal: Sleeping patterns will improve Outcome: Progressing   Problem: Coping: Goal: Ability to demonstrate self-control will improve Outcome: Progressing   Problem: Safety: Goal: Periods of time without injury will increase Outcome: Progressing

## 2023-11-10 NOTE — Group Note (Signed)
 LCSW Group Therapy Note   Group Date: 11/10/2023 Start Time: 1100 End Time: 1200  Type of Therapy and Topic:  Group Therapy - Who Am I?  Participation Level:  Active   Description of Group The focus of this group was to aid patients in self-exploration and awareness. Patients were guided in exploring various factors of oneself to include interests, readiness to change, management of emotions, and individual perception of self. Patients were provided with complementary worksheets exploring hidden talents, ease of asking other for help, music/media preferences, understanding and responding to feelings/emotions, and hope for the future. At group closing, patients were encouraged to adhere to discharge plan to assist in continued self-exploration and understanding.  Therapeutic Goals Patients learned that self-exploration and awareness is an ongoing process Patients identified their individual skills, preferences, and abilities Patients explored their openness to establish and confide in supports Patients explored their readiness for change and progression of mental health   Summary of Patient Progress:  Patient  engaged in introductory check-in. Patient  engaged in activity of self-exploration and identification,  completing complementary worksheet to assist in discussion. Patient identified various factors ranging from hidden talents, favorite music and movies, trusted individuals, accountability, and individual perceptions of self and hope. Pt engaged in processing thoughts and feelings as well as means of reframing thoughts. Pt proved receptive of alternate group members input and feedback from CSW.   Therapeutic Modalities Cognitive Behavioral Therapy Motivational Interviewing   Joslynn Jamroz R, LCSWA 11/11/2023  7:11 PM

## 2023-11-10 NOTE — BHH Group Notes (Signed)
 BHH Group Notes:  (Nursing/MHT/Case Management/Adjunct)  Date:  11/10/2023  Time:  9:21 AM  Type of Therapy:  Group Topic/ Focus: Goals Group: The focus of this group is to help patients establish daily goals to achieve during treatment and discuss how the patient can incorporate goal setting into their daily lives to aide in recovery.   Participation Level:  Active  Participation Quality:  Appropriate  Affect:  Appropriate  Cognitive:  Appropriate  Insight:  Appropriate  Engagement in Group:  Engaged  Modes of Intervention:  Discussion  Summary of Progress/Problems:  Patient attended and participated in goals/ orientation group today. Patient's goal for today is to stay positive and figure out placement.   Danette JONELLE Boos 11/10/2023, 9:21 AM

## 2023-11-10 NOTE — Group Note (Signed)
 Date:  11/10/2023 Time:  9:24 PM  Group Topic/Focus:  Wrap-Up Group:   The focus of this group is to help patients review their daily goal of treatment and discuss progress on daily workbooks.    Participation Level:  Did Not Attend  Participation Quality:   N/A  Affect:   N/A  Cognitive:   N/A  Insight: None  Engagement in Group:   N/A  Modes of Intervention:   N/A  Additional Comments:  Patient did not attend wrap up group.   Eward Mace 11/10/2023, 9:24 PM

## 2023-11-10 NOTE — Progress Notes (Signed)
 [facility] MD Progress Note  11/10/2023 2:29 PM Barbara Gordon  MRN:  [id number]  Reason for admission: 33 yr old female who presented on 12/27 to [facility] after a Suicide Attempt (OD- Seroquel , Gabapentin ) and bizarre behavior, she was admitted to [facility] on 12/31.  PPHx is significant for Schizophrenia, MDD, Borderline Personality Disorder, and a history of Polysubstance Abuse (Amphetamines, Opioids, THC), and Multiple Prior Suicide Attempts (OD) and Multiple Prior Psychiatric Hospitalizations (last- [facility] 11/2022).   Daily notes: Barbara Gordon remains without any complaints today. She presents alert, oriented & aware of situation. She states she is doing well today. Denies any new issues or concerns. She reports, I'm doing good. I have no symptoms of depression or anxiety. I slept well last night. My appetite is good. I did attend group sessions this morning. I still would like to go to a sober living house after discharge. I don't have any money for the required rent but I can get a job. Barbara Gordon currently denies any SIHI, AVH, delusional thoughts or paranoia. She does not appear to be responding to any internal stimuli. There are no changes made on her current plan of care. Will continue as already in progress. Her vital signs remain, stable.   Collateral information unavailable at this time: Barbara Gordon says she does not know any number to give this provider to call call for collateral information. She presents very guarded. She did say she lived with a guy named Barbara. Barbara Gordon is the emergency contact person listed. When his number was called, Barbara answered the phone & states, I got nothing to tell you about Barbara Gordon, sorry. He hung-up his phone.   Principal Problem: Disorganized schizophrenia, chronic condition (HCC)  Diagnosis: Principal Problem:   Disorganized schizophrenia, chronic condition (HCC)  Total Time spent with patient:  35 minutes  Past Psychiatric History: See H&P.  Past Medical History:  Past Medical  History:  Diagnosis Date   Hypothyroidism    Polysubstance abuse (HCC)    Schizophrenia (HCC)    History reviewed. No pertinent surgical history.  Family History: History reviewed. No pertinent family history.  Family Psychiatric  History: See H&P.  Social History:  Social History   Substance and Sexual Activity  Alcohol Use Not Currently   Comment: denies     Social History   Substance and Sexual Activity  Drug Use Not Currently   Types: Cocaine, Amphetamines, Methamphetamines, Marijuana   Comment: denies    Social History   Socioeconomic History   Marital status: Single    Spouse name: Not on file   Number of children: Not on file   Years of education: Not on file   Highest education level: Not on file  Occupational History   Not on file  Tobacco Use   Smoking status: Some Days    Current packs/day: 1.00    Types: Cigarettes, E-cigarettes   Smokeless tobacco: Never  Vaping Use   Vaping status: Never Used  Substance and Sexual Activity   Alcohol use: Not Currently    Comment: denies   Drug use: Not Currently    Types: Cocaine, Amphetamines, Methamphetamines, Marijuana    Comment: denies   Sexual activity: Not Currently  Other Topics Concern   Not on file  Social History Narrative   Not on file   Social Drivers of Health   Financial Resource Strain: Low Risk  (11/20/2022)   Received from [facility]   Overall Financial Resource Strain (CARDIA)    Difficulty of Paying Living Expenses:  Not very hard  Food Insecurity: Patient Unable To Answer (11/07/2023)   Hunger Vital Sign    Worried About Running Out of Food in the Last Year: Patient unable to answer    Ran Out of Food in the Last Year: Patient unable to answer  Transportation Needs: Patient Unable To Answer (11/07/2023)   PRAPARE - Transportation    Lack of Transportation (Medical): Patient unable to answer    Lack of Transportation (Non-Medical): Patient unable to answer  Recent Concern:  Transportation Needs - Unmet Transportation Needs (11/06/2023)   PRAPARE - Transportation    Lack of Transportation (Medical): Yes    Lack of Transportation (Non-Medical): Yes  Physical Activity: Not on file  Stress: No Stress Concern Present (11/20/2022)   Received from [facility] - Occupational Stress Questionnaire    Feeling of Stress : Only a little  Social Connections: Unknown (03/09/2022)   Received from [facility]   Social Network    Social Network: Not on file   Additional Social History:   Sleep: Good  Appetite:  Good  Current Medications: Current Facility-Administered Medications  Medication Dose Route Frequency Provider Last Rate Last Admin   acetaminophen  (TYLENOL ) tablet 650 mg  650 mg Oral Q6H PRN Starkes-Perry, Takia S, FNP       alum & mag hydroxide-simeth (MAALOX/MYLANTA) 200-200-20 MG/5ML suspension 30 mL  30 mL Oral Q4H PRN Starkes-Perry, Takia S, FNP       haloperidol  (HALDOL ) tablet 5 mg  5 mg Oral TID PRN Johny Lot, MD   5 mg at 11/08/23 1426   And   diphenhydrAMINE  (BENADRYL ) capsule 50 mg  50 mg Oral TID PRN Johny Lot, MD   50 mg at 11/08/23 1426   haloperidol  lactate (HALDOL ) injection 5 mg  5 mg Intramuscular TID PRN Massengill, Lot, MD       And   diphenhydrAMINE  (BENADRYL ) injection 50 mg  50 mg Intramuscular TID PRN Massengill, Lot, MD       And   LORazepam  (ATIVAN ) injection 2 mg  2 mg Intramuscular TID PRN Massengill, Lot, MD       haloperidol  lactate (HALDOL ) injection 10 mg  10 mg Intramuscular TID PRN Massengill, Lot, MD       And   diphenhydrAMINE  (BENADRYL ) injection 50 mg  50 mg Intramuscular TID PRN Massengill, Lot, MD       And   LORazepam  (ATIVAN ) injection 2 mg  2 mg Intramuscular TID PRN Massengill, Lot, MD       gabapentin  (NEURONTIN ) capsule 300 mg  300 mg Oral QHS Starkes-Perry, Takia S, FNP   300 mg at 11/09/23 2042   hydrOXYzine   (ATARAX ) tablet 25 mg  25 mg Oral TID PRN Starkes-Perry, Takia S, FNP   25 mg at 11/10/23 1157   magnesium  hydroxide (MILK OF MAGNESIA) suspension 30 mL  30 mL Oral Daily PRN Starkes-Perry, Takia S, FNP       melatonin tablet 5 mg  5 mg Oral QHS Starkes-Perry, Takia S, FNP   5 mg at 11/09/23 2042   nicotine  (NICODERM CQ  - dosed in mg/24 hours) patch 14 mg  14 mg Transdermal Daily Wilkie Majel RAMAN, FNP   14 mg at 11/10/23 0811   ondansetron  (ZOFRAN -ODT) disintegrating tablet 4 mg  4 mg Oral Q6H PRN Collene Gouge I, NP   4 mg at 11/09/23 1834   [START ON 11/11/2023] pneumococcal 20-valent conjugate vaccine (PREVNAR 20 ) injection 0.5  mL  0.5 mL Intramuscular Tomorrow-1000 Massengill, Rankin, MD       QUEtiapine  (SEROQUEL ) tablet 50 mg  50 mg Oral QHS PRN Raliegh Marsa RAMAN, MD       risperiDONE  (RISPERDAL  M-TABS) disintegrating tablet 1 mg  1 mg Oral QHS Pashayan, Alexander S, MD   1 mg at 11/09/23 2042    Lab Results:  Results for orders placed or performed during the hospital encounter of 11/07/23 (from the past 48 hours)  Lipid panel     Status: Abnormal   Collection Time: 11/10/23  6:35 AM  Result Value Ref Range   Cholesterol 213 (H) 0 - 200 mg/dL   Triglycerides 90 <849 mg/dL   HDL 52 >59 mg/dL   Total CHOL/HDL Ratio 4.1 RATIO   VLDL 18 0 - 40 mg/dL   LDL Cholesterol 856 (H) 0 - 99 mg/dL    Comment:        Total Cholesterol/HDL:CHD Risk Coronary Heart Disease Risk Table                     Men   Women  1/2 Average Risk   3.4   3.3  Average Risk       5.0   4.4  2 X Average Risk   9.6   7.1  3 X Average Risk  23.4   11.0        Use the calculated Patient Ratio above and the CHD Risk Table to determine the patient's CHD Risk.        ATP III CLASSIFICATION (LDL):  <100     mg/dL   Optimal  899-870  mg/dL   Near or Above                    Optimal  130-159  mg/dL   Borderline  839-810  mg/dL   High  >809     mg/dL   Very High Performed at [facility],  [address] [place], [place] [address]     Blood Alcohol level:  Lab Results  Component Value Date   [facility] <10 11/04/2023   ETH <10 10/27/2023    Metabolic Disorder Labs: Lab Results  Component Value Date   HGBA1C 4.7 (L) 04/18/2022   MPG 88.19 04/18/2022   MPG 97 01/15/2022   Lab Results  Component Value Date   PROLACTIN 189.0 (H) 04/16/2022   PROLACTIN 2.6 (L) 01/15/2022   Lab Results  Component Value Date   CHOL 213 (H) 11/10/2023   TRIG 90 11/10/2023   HDL 52 11/10/2023   CHOLHDL 4.1 11/10/2023   VLDL 18 11/10/2023   LDLCALC 143 (H) 11/10/2023   LDLCALC 133 (H) 01/15/2022    Physical Findings: AIMS:  , ,  ,  ,    CIWA:    COWS:     Musculoskeletal: Strength & Muscle Tone: within normal limits Gait & Station: normal Patient leans: N/A  Psychiatric Specialty Exam:  Presentation  General Appearance:  Casual; Fairly Groomed  Eye Contact: Good  Speech: Clear and Coherent; Normal Rate  Speech Volume: Normal  Handedness: Right   Mood and Affect  Mood: Euthymic  Affect: Congruent  Thought Process  Thought Processes: Coherent; Goal Directed  Descriptions of Associations:Intact  Orientation:Full (Time, Place and Person)  Thought Content:Logical  History of Schizophrenia/Schizoaffective disorder:Yes  Duration of Psychotic Symptoms:Greater than six months  Hallucinations:Hallucinations: None  Ideas of Reference:None  Suicidal Thoughts:Suicidal Thoughts: No  Homicidal Thoughts:Homicidal  Thoughts: No  Sensorium  Memory: Immediate Good; Recent Fair; Remote Fair  Judgment: Fair  Insight: Fair   Executive Functions  Concentration: Good  Attention Span: Good  Recall: Fair  Fund of Knowledge: Fair  Language: Good  Psychomotor Activity  Psychomotor Activity: Psychomotor Activity: Normal  Assets  Assets: Communication Skills; Desire for Improvement; Physical Health  Sleep  Sleep: Sleep: Good Number of  Hours of Sleep: 8   Physical Exam: Physical Exam Vitals and nursing note reviewed.  HENT:     Nose: Nose normal.  Cardiovascular:     Rate and Rhythm: Normal rate.     Pulses: Normal pulses.  Pulmonary:     Effort: Pulmonary effort is normal.  Genitourinary:    Comments: Deferred Musculoskeletal:        General: Normal range of motion.     Cervical back: Normal range of motion.  Skin:    General: Skin is warm and dry.  Neurological:     General: No focal deficit present.     Mental Status: She is oriented to person, place, and time.    Review of Systems  Constitutional:  Negative for chills and fever.  Respiratory:  Negative for cough, shortness of breath and wheezing.   Cardiovascular:  Negative for chest pain and palpitations.  Gastrointestinal:  Negative for abdominal pain, constipation, diarrhea, heartburn, nausea and vomiting.  Musculoskeletal:  Negative for joint pain and myalgias.  Neurological:  Negative for dizziness, tingling, tremors, sensory change, speech change, focal weakness, seizures, loss of consciousness, weakness and headaches.  Psychiatric/Behavioral:  Positive for substance abuse.    Blood pressure 135/83, pulse (!) 108, temperature 98.3 F (36.8 C), temperature source Oral, resp. rate 18, height 5' 1 (1.549 m), weight 54.4 kg, last menstrual period 11/04/2023, SpO2 97%. Body mass index is 22.67 kg/m.  Treatment Plan Summary: Daily contact with patient to assess and evaluate symptoms and progress in treatment and Medication management.   Continue inpatient hospitalization.  Will continue today 11/10/2023 plan as below except where it is noted.   Schizophrenia: -Continue Risperdal  1 mg QHS for Psychosis -Continue Gabapentin  300 mg TID for anxiety -Continue Agitation Protocol: Haldol /Ativan /Benadryl .  -Continue Melatonin 5 mg po q hs for insomnia.  -Continue Seroquel  50 mg po Q hs prn for insomnia.   Nicotine  Dependence: -Continue Nicotine  Patch  14 mg daily   -Continue Melatonin 5 mg QHS for Insomnia -Change Seroquel  50 mg to QHS PRN for Insomnia -Continue PRN's: Tylenol , Maalox, Atarax , Milk of Magnesia, Trazodone   Mac Bolster, NP, pmhnp, fnp-bc. 11/10/2023, 2:29 PM Patient ID: Barbara Gordon, female   DOB: 1990-11-29, 33 y.o.   MRN: [id number]

## 2023-11-10 NOTE — Progress Notes (Signed)
   11/09/23 2130  Psych Admission Type (Psych Patients Only)  Admission Status Involuntary  Psychosocial Assessment  Patient Complaints Anxiety;Depression  Eye Contact Fair  Facial Expression Worried  Affect Anxious  Speech Logical/coherent  Interaction Guarded  Motor Activity Restless;Fidgety  Appearance/Hygiene Disheveled  Behavior Characteristics Cooperative  Mood Depressed;Anxious (Denied anxiety /Depression, but displayed in affect and mood.)  Thought Process  Coherency Loose associations  Content Preoccupation;Paranoia  Delusions Paranoid  Perception UTA  Hallucination None reported or observed  Judgment Poor  Confusion UTA  Danger to Self  Current suicidal ideation? Denies  Agreement Not to Harm Self Yes  Description of Agreement Verbal  Danger to Others  Danger to Others None reported or observed

## 2023-11-10 NOTE — Plan of Care (Signed)
  Problem: Coping: Goal: Ability to demonstrate self-control will improve Outcome: Progressing   Problem: Physical Regulation: Goal: Ability to maintain clinical measurements within normal limits will improve Outcome: Progressing   Problem: Safety: Goal: Periods of time without injury will increase Outcome: Progressing   

## 2023-11-10 NOTE — Progress Notes (Signed)
 D. Pt presents with an anxious affect/ anxious, depressed mood- Pt given Vistaril  per request for increased anxiety after attending group led by SW.SABRA Pt has been appropriate during interactions, observed in the milieu attending groups. Pt currently denies SI/HI and AVH and does not appear to be responding to internal stimuli  A. Labs and vitals monitored.  Pt supported emotionally and encouraged to express concerns and ask questions.   R. Pt remains safe with 15 minute checks. Will continue POC.

## 2023-11-11 DIAGNOSIS — F201 Disorganized schizophrenia: Secondary | ICD-10-CM | POA: Diagnosis not present

## 2023-11-11 LAB — HEMOGLOBIN A1C
Hgb A1c MFr Bld: 5.1 % (ref 4.8–5.6)
Mean Plasma Glucose: 100 mg/dL

## 2023-11-11 MED ORDER — LISINOPRIL 10 MG PO TABS
10.0000 mg | ORAL_TABLET | Freq: Once | ORAL | Status: AC
  Administered 2023-11-11: 10 mg via ORAL
  Filled 2023-11-11: qty 1

## 2023-11-11 MED ORDER — LISINOPRIL 5 MG PO TABS
5.0000 mg | ORAL_TABLET | Freq: Every day | ORAL | Status: DC
Start: 2023-11-12 — End: 2023-11-21
  Administered 2023-11-12 – 2023-11-21 (×10): 5 mg via ORAL
  Filled 2023-11-11 (×13): qty 1

## 2023-11-11 MED ORDER — RISPERIDONE 2 MG PO TABS
2.0000 mg | ORAL_TABLET | Freq: Every day | ORAL | Status: DC
  Administered 2023-11-11 – 2023-11-12 (×2): 2 mg via ORAL
  Filled 2023-11-11 (×3): qty 1

## 2023-11-11 NOTE — Plan of Care (Signed)
  Problem: Education: Goal: Knowledge of [facility] Education information/materials will improve Outcome: Progressing Goal: Emotional status will improve Outcome: Progressing Goal: Mental status will improve Outcome: Progressing Goal: Verbalization of understanding the information provided will improve Outcome: Progressing   Problem: Activity: Goal: Sleeping patterns will improve Outcome: Progressing   Problem: Activity: Goal: Interest or engagement in activities will improve Outcome: Not Progressing

## 2023-11-11 NOTE — Group Note (Signed)
 Recreation Therapy Group Note   Group Topic:Team Building  Group Date: 11/11/2023 Start Time: 0930 End Time: 9046 Facilitators: Aven Christen-McCall, LRT,CTRS Location: [address] Dayroom   Group Topic: Communication, Team Building, Problem Solving  Goal Area(s) Addresses:  Patient will effectively work with peer towards shared goal.  Patient will identify skills used to make activity successful.  Patient will identify how skills used during activity can be used to reach post d/c goals.   Intervention: STEM Activity  Group Description: Straw Bridge. In teams of 3-5, patients were given 15 plastic drinking straws and an equal length of masking tape. Using the materials provided, patients were instructed to build a free standing bridge-like structure to suspend an everyday item (ex: puzzle box) off of the floor or table surface. All materials were required to be used by the team in their design. LRT facilitated post-activity discussion reviewing team process. Patients were encouraged to reflect how the skills used in this activity can be generalized to daily life post discharge.   Education: Pharmacist, Community, Scientist, Physiological, Discharge Planning   Education Outcome: Acknowledges education/In group clarification offered/Needs additional education.    Affect/Mood: N/A   Participation Level: Did not attend    Clinical Observations/Individualized Feedback:     Plan: Continue to engage patient in RT group sessions 2-3x/week.   Cordon Gassett-McCall, LRT,CTRS  11/11/2023 12:03 PM

## 2023-11-11 NOTE — Progress Notes (Signed)
   11/11/23 0000  Psych Admission Type (Psych Patients Only)  Admission Status Involuntary  Psychosocial Assessment  Patient Complaints Depression  Eye Contact Fair  Facial Expression Flat  Affect Depressed;Anxious  Speech Logical/coherent  Interaction Cautious;Avoidant;Guarded  Motor Activity Other (Comment) (WNL)  Appearance/Hygiene Unremarkable  Behavior Characteristics Cooperative  Mood Depressed;Anxious  Thought Process  Coherency WDL  Content WDL  Delusions None reported or observed  Perception WDL  Hallucination None reported or observed  Judgment Poor  Confusion None  Danger to Self  Current suicidal ideation? Denies  Agreement Not to Harm Self Yes  Description of Agreement Verbal contract for safety  Danger to Others  Danger to Others None reported or observed

## 2023-11-11 NOTE — Plan of Care (Signed)
  Problem: Education: Goal: Emotional status will improve Outcome: Progressing Goal: Verbalization of understanding the information provided will improve Outcome: Progressing   Problem: Activity: Goal: Sleeping patterns will improve Outcome: Progressing   Problem: Coping: Goal: Ability to verbalize frustrations and anger appropriately will improve Outcome: Progressing   Problem: Health Behavior/Discharge Planning: Goal: Identification of resources available to assist in meeting health care needs will improve Outcome: Progressing   Problem: Physical Regulation: Goal: Ability to maintain clinical measurements within normal limits will improve Outcome: Progressing

## 2023-11-11 NOTE — Progress Notes (Signed)
 [facility] MD Progress Note  11/11/2023 1:53 PM Barbara Gordon  MRN:  [id number]  Reason for admission: 33 yr old female who presented on 12/27 to [facility] after a Suicide Attempt (OD- Seroquel , Gabapentin ) and bizarre behavior, she was admitted to [facility] on 12/31.  PPHx is significant for Schizophrenia, MDD, Borderline Personality Disorder, and a history of Polysubstance Abuse (Amphetamines, Opioids, THC), and Multiple Prior Suicide Attempts (OD) and Multiple Prior Psychiatric Hospitalizations (last- [facility] 11/2022).   Daily notes: Barbara Gordon presents today a bit off & disorganized than the last two days. Although reports, I'm alright. I have no symptoms of depression, just a little anxious but I don't know why. I slept well last night. I'm taking my medicines. There are no side effects to report. During this evaluation, Barbara Gordon appears spaced out, may be internally preoccupied. She maintains that there is no one she has that could provide collateral information for her here. She says the only one she has is her friend Barbara & his mother. Barbara Gordon states that she has been living with Barbara & his mother for about 1 year. She states that Barbara is not her boyfriend, just a friend. When she was asked about the person caring for her 7 year old son? She replied, She is my child's paternal grandmother. I do not know how to contact her because I don't have her contact information. Barbara Gordon is the emergency contact person listed on the chart for Barbara Gordon. When Barbara was contacted yesterday for collateral information, he replied, I got nothing to tell you about Barbara Gordon. He hung up the phone. Although appears pre-occupied, Barbara Gordon currently denies any SIHI, AVH, delusional thoughts or paranoia. She says after discharge, will not be going back to live with Barbara & his mother. Discussed this case with the attending psychiatrist. Her Risperdal  has been increased from 1 mg to 2 mg po Q hs for mood control. Barbara Gordon is started on Lisinopril  for elevated  blood pressure. She spends more time in her room than outside her room. She is being encouraged group participation. See the treatment plan below. Continue as already in progress.   Collateral information unavailable at this time: Barbara Gordon says she does not know any number to give this provider to call call for collateral information. She presents very guarded. She did say she lived with a guy named Barbara. Barbara Gordon is the emergency contact person listed. When his number was called, Barbara answered the phone & states, I got nothing to tell you about Barbara Gordon, sorry. He hung-up his phone.   Principal Problem: Disorganized schizophrenia, chronic condition (HCC)  Diagnosis: Principal Problem:   Disorganized schizophrenia, chronic condition (HCC)  Total Time spent with patient:  35 minutes  Past Psychiatric History: See H&P.  Past Medical History:  Past Medical History:  Diagnosis Date   Hypothyroidism    Polysubstance abuse (HCC)    Schizophrenia (HCC)    History reviewed. No pertinent surgical history.  Family History: History reviewed. No pertinent family history.  Family Psychiatric  History: See H&P.  Social History:  Social History   Substance and Sexual Activity  Alcohol Use Not Currently   Comment: denies     Social History   Substance and Sexual Activity  Drug Use Not Currently   Types: Cocaine, Amphetamines, Methamphetamines, Marijuana   Comment: denies    Social History   Socioeconomic History   Marital status: Single    Spouse name: Not on file   Number of children: Not on file  Years of education: Not on file   Highest education level: Not on file  Occupational History   Not on file  Tobacco Use   Smoking status: Some Days    Current packs/day: 1.00    Types: Cigarettes, E-cigarettes   Smokeless tobacco: Never  Vaping Use   Vaping status: Never Used  Substance and Sexual Activity   Alcohol use: Not Currently    Comment: denies   Drug use: Not Currently     Types: Cocaine, Amphetamines, Methamphetamines, Marijuana    Comment: denies   Sexual activity: Not Currently  Other Topics Concern   Not on file  Social History Narrative   Not on file   Social Drivers of Health   Financial Resource Strain: Low Risk  (11/20/2022)   Received from [facility]   Overall Financial Resource Strain (CARDIA)    Difficulty of Paying Living Expenses: Not very hard  Food Insecurity: Patient Unable To Answer (11/07/2023)   Hunger Vital Sign    Worried About Running Out of Food in the Last Year: Patient unable to answer    Ran Out of Food in the Last Year: Patient unable to answer  Transportation Needs: Patient Unable To Answer (11/07/2023)   PRAPARE - Transportation    Lack of Transportation (Medical): Patient unable to answer    Lack of Transportation (Non-Medical): Patient unable to answer  Recent Concern: Transportation Needs - Unmet Transportation Needs (11/06/2023)   PRAPARE - Transportation    Lack of Transportation (Medical): Yes    Lack of Transportation (Non-Medical): Yes  Physical Activity: Not on file  Stress: No Stress Concern Present (11/20/2022)   Received from [facility] Occupational [facility] - Occupational Stress Questionnaire    Feeling of Stress : Only a little  Social Connections: Unknown (03/09/2022)   Received from [facility]   Social Network    Social Network: Not on file   Additional Social History:   Sleep: Good  Appetite:  Good  Current Medications: Current Facility-Administered Medications  Medication Dose Route Frequency Provider Last Rate Last Admin   acetaminophen  (TYLENOL ) tablet 650 mg  650 mg Oral Q6H PRN Starkes-Perry, Takia S, FNP       alum & mag hydroxide-simeth (MAALOX/MYLANTA) 200-200-20 MG/5ML suspension 30 mL  30 mL Oral Q4H PRN Starkes-Perry, Takia S, FNP       haloperidol  (HALDOL ) tablet 5 mg  5 mg Oral TID PRN Johny Lot, MD   5 mg at 11/08/23 1426    And   diphenhydrAMINE  (BENADRYL ) capsule 50 mg  50 mg Oral TID PRN Johny Lot, MD   50 mg at 11/08/23 1426   haloperidol  lactate (HALDOL ) injection 5 mg  5 mg Intramuscular TID PRN Massengill, Lot, MD       And   diphenhydrAMINE  (BENADRYL ) injection 50 mg  50 mg Intramuscular TID PRN Massengill, Lot, MD       And   LORazepam  (ATIVAN ) injection 2 mg  2 mg Intramuscular TID PRN Massengill, Lot, MD       haloperidol  lactate (HALDOL ) injection 10 mg  10 mg Intramuscular TID PRN Massengill, Lot, MD       And   diphenhydrAMINE  (BENADRYL ) injection 50 mg  50 mg Intramuscular TID PRN Massengill, Lot, MD       And   LORazepam  (ATIVAN ) injection 2 mg  2 mg Intramuscular TID PRN Massengill, Lot, MD       gabapentin  (NEURONTIN ) capsule 300 mg  300 mg Oral QHS Starkes-Perry, Takia S, FNP   300 mg at 11/10/23 2129   hydrOXYzine  (ATARAX ) tablet 25 mg  25 mg Oral TID PRN Starkes-Perry, Takia S, FNP   25 mg at 11/11/23 1325   magnesium  hydroxide (MILK OF MAGNESIA) suspension 30 mL  30 mL Oral Daily PRN Starkes-Perry, Takia S, FNP       melatonin tablet 5 mg  5 mg Oral QHS Starkes-Perry, Takia S, FNP   5 mg at 11/10/23 2129   nicotine  (NICODERM CQ  - dosed in mg/24 hours) patch 14 mg  14 mg Transdermal Daily Wilkie Majel RAMAN, FNP   14 mg at 11/11/23 9195   ondansetron  (ZOFRAN -ODT) disintegrating tablet 4 mg  4 mg Oral Q6H PRN Collene Gouge I, NP   4 mg at 11/09/23 8165   QUEtiapine  (SEROQUEL ) tablet 50 mg  50 mg Oral QHS PRN Raliegh Marsa RAMAN, MD       risperiDONE  (RISPERDAL ) tablet 2 mg  2 mg Oral QHS Collene Gouge I, NP        Lab Results:  Results for orders placed or performed during the hospital encounter of 11/07/23 (from the past 48 hours)  Lipid panel     Status: Abnormal   Collection Time: 11/10/23  6:35 AM  Result Value Ref Range   Cholesterol 213 (H) 0 - 200 mg/dL   Triglycerides 90 <849 mg/dL   HDL 52 >59 mg/dL   Total CHOL/HDL Ratio 4.1 RATIO   VLDL 18 0 -  40 mg/dL   LDL Cholesterol 856 (H) 0 - 99 mg/dL    Comment:        Total Cholesterol/HDL:CHD Risk Coronary Heart Disease Risk Table                     Men   Women  1/2 Average Risk   3.4   3.3  Average Risk       5.0   4.4  2 X Average Risk   9.6   7.1  3 X Average Risk  23.4   11.0        Use the calculated Patient Ratio above and the CHD Risk Table to determine the patient's CHD Risk.        ATP III CLASSIFICATION (LDL):  <100     mg/dL   Optimal  899-870  mg/dL   Near or Above                    Optimal  130-159  mg/dL   Borderline  839-810  mg/dL   High  >809     mg/dL   Very High Performed at [facility], [address] [place], [place] [address]   Hemoglobin A1c     Status: None   Collection Time: 11/10/23  6:35 AM  Result Value Ref Range   Hgb A1c MFr Bld 5.1 4.8 - 5.6 %    Comment: (NOTE)         Prediabetes: 5.7 - 6.4         Diabetes: >6.4         Glycemic control for adults with diabetes: <7.0    Mean Plasma Glucose 100 mg/dL    Comment: (NOTE) Performed At: [facility] [address] [place], [place] [id number] Jennette Shorter MD Ey:[id number]    Blood Alcohol level:  Lab Results  Component Value Date   ETH <10 11/04/2023   ETH <10 10/27/2023  Metabolic Disorder Labs: Lab Results  Component Value Date   HGBA1C 5.1 11/10/2023   MPG 100 11/10/2023   MPG 88.19 04/18/2022   Lab Results  Component Value Date   PROLACTIN 189.0 (H) 04/16/2022   PROLACTIN 2.6 (L) 01/15/2022   Lab Results  Component Value Date   CHOL 213 (H) 11/10/2023   TRIG 90 11/10/2023   HDL 52 11/10/2023   CHOLHDL 4.1 11/10/2023   VLDL 18 11/10/2023   LDLCALC 143 (H) 11/10/2023   LDLCALC 133 (H) 01/15/2022   Physical Findings: AIMS:  , ,  ,  ,    CIWA:    COWS:     Musculoskeletal: Strength & Muscle Tone: within normal limits Gait & Station: normal Patient leans: N/A  Psychiatric Specialty Exam:  Presentation  General Appearance:   Casual; Fairly Groomed  Eye Contact: Good  Speech: Clear and Coherent; Normal Rate  Speech Volume: Normal  Handedness: Right   Mood and Affect  Mood: Euthymic  Affect: Congruent  Thought Process  Thought Processes: Coherent; Goal Directed  Descriptions of Associations:Intact  Orientation:Full (Time, Place and Person)  Thought Content:Logical  History of Schizophrenia/Schizoaffective disorder:Yes  Duration of Psychotic Symptoms:Greater than six months  Hallucinations:Hallucinations: None  Ideas of Reference:None  Suicidal Thoughts:Suicidal Thoughts: No  Homicidal Thoughts:Homicidal Thoughts: No  Sensorium  Memory: Immediate Good; Recent Fair; Remote Fair  Judgment: Fair  Insight: Fair  Art Therapist  Concentration: Good  Attention Span: Good  Recall: Fair  Fund of Knowledge: Fair  Language: Good  Psychomotor Activity  Psychomotor Activity: Psychomotor Activity: Normal  Assets  Assets: Communication Skills; Desire for Improvement; Physical Health  Sleep  Sleep: Sleep: Good Number of Hours of Sleep: 8  Physical Exam: Physical Exam Vitals and nursing note reviewed.  HENT:     Nose: Nose normal.  Cardiovascular:     Rate and Rhythm: Normal rate.     Pulses: Normal pulses.  Pulmonary:     Effort: Pulmonary effort is normal.  Genitourinary:    Comments: Deferred Musculoskeletal:        General: Normal range of motion.     Cervical back: Normal range of motion.  Skin:    General: Skin is warm and dry.  Neurological:     General: No focal deficit present.     Mental Status: She is oriented to person, place, and time.    Review of Systems  Constitutional:  Negative for chills and fever.  Respiratory:  Negative for cough, shortness of breath and wheezing.   Cardiovascular:  Negative for chest pain and palpitations.  Gastrointestinal:  Negative for abdominal pain, constipation, diarrhea, heartburn, nausea and  vomiting.  Musculoskeletal:  Negative for joint pain and myalgias.  Neurological:  Negative for dizziness, tingling, tremors, sensory change, speech change, focal weakness, seizures, loss of consciousness, weakness and headaches.  Psychiatric/Behavioral:  Positive for substance abuse.    Blood pressure (!) 123/92, pulse 99, temperature 98 F (36.7 C), temperature source Oral, resp. rate 16, height 5' 1 (1.549 m), weight 54.4 kg, last menstrual period 11/04/2023, SpO2 98%. Body mass index is 22.67 kg/m.  Treatment Plan Summary: Daily contact with patient to assess and evaluate symptoms and progress in treatment and Medication management.   Continue inpatient hospitalization.  Will continue today 11/11/2023 plan as below except where it is noted.   Schizophrenia: -Increased Risperdal  from 1 mg to 2 mg po Q HS for Psychosis -Continue Gabapentin  300 mg TID for anxiety -Continue Agitation Protocol: Haldol /Ativan /Benadryl .  -Continue  Melatonin 5 mg po q hs for insomnia.  -Continue Seroquel  50 mg po Q hs prn for insomnia. -Initiated Lisinopril  10 mg po once today for HTN (11-11-23).  -Continue Lisinopril  5 mg po daily for HTN (Start 11-12-23).   Nicotine  Dependence: -Continue Nicotine  Patch 14 mg daily   -Continue Melatonin 5 mg QHS for Insomnia -Change Seroquel  50 mg to QHS PRN for Insomnia -Continue PRN's: Tylenol , Maalox, Atarax , Milk of Magnesia, Trazodone   Mac Bolster, NP, pmhnp, fnp-bc. 11/11/2023, 1:53 PM Patient ID: Barbara Gordon, female   DOB: 1991-10-22, 33 y.o.   MRN: [id number] Patient ID: Barbara Gordon, female   DOB: 1991-08-14, 33 y.o.   MRN: [id number]

## 2023-11-11 NOTE — Progress Notes (Addendum)
 Patient denies SI, HI and AVH. Denies any depression or anxiety saying not really and I don't think so. However later received PRN hydroxyzine  x2 for anxiety. Patient is visible in the day room and appears anxious and does not interact with peers. Patient preoccupied asking multiple times is it time for my anxiety meds yet? One hour after receiving first hydroxyzine . And Is it time for my bedtime meds yet? right after dinner. All scheduled medications were administered and no adverse effects were noted. Patient remains safe on the unit with Q 15 minute safety checks in place.   11/11/23 1000  Psych Admission Type (Psych Patients Only)  Admission Status Involuntary  Psychosocial Assessment  Patient Complaints None  Eye Contact Fair  Facial Expression Anxious;Worried  Affect Anxious;Depressed  Freight Forwarder Activity Other (Comment) (wnl)  Appearance/Hygiene Unremarkable  Behavior Characteristics Cooperative  Mood Depressed;Anxious  Thought Process  Coherency WDL  Content Preoccupation  Delusions None reported or observed  Perception WDL  Hallucination None reported or observed  Judgment Poor  Confusion None  Danger to Self  Current suicidal ideation? Denies  Agreement Not to Harm Self Yes  Description of Agreement verbal  Danger to Others  Danger to Others None reported or observed

## 2023-11-11 NOTE — Group Note (Signed)
 Date:  11/11/2023 Time:  9:30 PM  Group Topic/Focus:  Wrap-Up Group:   The focus of this group is to help patients review their daily goal of treatment and discuss progress on daily workbooks.    Participation Level:  None  Participation Quality:  Appropriate  Affect:  Appropriate  Cognitive:  Appropriate  Insight: Improving  Engagement in Group:  None  Modes of Intervention:  Discussion  Additional Comments:  Client attended group which was led by members of Alcoholics Anonymous.   Rutherford PARAS Keeya Dyckman 11/11/2023, 9:30 PM

## 2023-11-11 NOTE — Progress Notes (Signed)
   11/11/23 2000  Psych Admission Type (Psych Patients Only)  Admission Status Involuntary  Psychosocial Assessment  Patient Complaints Insomnia  Eye Contact Fair  Facial Expression Flat  Affect Depressed;Anxious  Speech Logical/coherent  Interaction Cautious;Avoidant;Guarded  Motor Activity Other (Comment) (WNL)  Appearance/Hygiene Unremarkable  Behavior Characteristics Cooperative  Mood Depressed;Anxious  Thought Process  Coherency WDL  Content WDL  Delusions None reported or observed  Perception WDL  Hallucination None reported or observed  Judgment Poor  Confusion None  Danger to Self  Current suicidal ideation? Denies  Agreement Not to Harm Self Yes  Description of Agreement Verbal contract for safety  Danger to Others  Danger to Others None reported or observed

## 2023-11-11 NOTE — Plan of Care (Signed)
  Problem: Education: Goal: Knowledge of [facility] Education information/materials will improve Outcome: Progressing Goal: Mental status will improve Outcome: Progressing Goal: Verbalization of understanding the information provided will improve Outcome: Progressing   Problem: Activity: Goal: Interest or engagement in activities will improve Outcome: Progressing   Problem: Coping: Goal: Ability to verbalize frustrations and anger appropriately will improve Outcome: Progressing Goal: Ability to demonstrate self-control will improve Outcome: Progressing

## 2023-11-11 NOTE — Group Note (Signed)
 Date:  11/11/2023 Time:  10:34 AM  Group Topic/Focus:  Goals Group:   The focus of this group is to help patients establish daily goals to achieve during treatment and discuss how the patient can incorporate goal setting into their daily lives to aide in recovery. Orientation:   The focus of this group is to educate the patient on the purpose and policies of crisis stabilization and provide a format to answer questions about their admission.  The group details unit policies and expectations of patients while admitted.    Participation Level:  Did Not Attend  Participation Quality:   n/a  Affect:   n/a  Cognitive:   n/a  Insight: None  Engagement in Group:   n/a  Modes of Intervention:   n/a  Additional Comments:   Pt did not attend the group.  Addison HERO Atilano Covelli 11/11/2023, 10:34 AM

## 2023-11-12 NOTE — Plan of Care (Signed)
  Problem: Education: Goal: Emotional status will improve Outcome: Progressing Goal: Verbalization of understanding the information provided will improve Outcome: Progressing   Problem: Activity: Goal: Interest or engagement in activities will improve Outcome: Progressing Goal: Sleeping patterns will improve Outcome: Progressing   Problem: Coping: Goal: Ability to verbalize frustrations and anger appropriately will improve Outcome: Progressing Goal: Ability to demonstrate self-control will improve Outcome: Progressing   Problem: Health Behavior/Discharge Planning: Goal: Identification of resources available to assist in meeting health care needs will improve Outcome: Progressing

## 2023-11-12 NOTE — Group Note (Signed)
 Date:  11/12/2023 Time:  11:34 AM  Group Topic/Focus:  Goals Group:   The focus of this group is to help patients establish daily goals to achieve during treatment and discuss how the patient can incorporate goal setting into their daily lives to aide in recovery. Orientation:   The focus of this group is to educate the patient on the purpose and policies of crisis stabilization and provide a format to answer questions about their admission.  The group details unit policies and expectations of patients while admitted.    Participation Level:  Did Not Attend  Participation Quality:   n/a  Affect:   n/a  Cognitive:   n/a  Insight: None  Engagement in Group:   n/a  Modes of Intervention:   n/a  Additional Comments:   Pt did not attend the group.  Barbara Gordon 11/12/2023, 11:34 AM

## 2023-11-12 NOTE — Progress Notes (Signed)
   11/12/23 2000  Psych Admission Type (Psych Patients Only)  Admission Status Involuntary  Psychosocial Assessment  Patient Complaints Anxiety  Eye Contact Fair  Facial Expression Flat  Affect Depressed;Anxious  Speech Logical/coherent  Interaction Cautious;Avoidant;Guarded  Motor Activity Other (Comment) (WNL)  Appearance/Hygiene Unremarkable  Behavior Characteristics Cooperative  Mood Anxious  Thought Process  Coherency WDL  Content WDL  Delusions None reported or observed  Perception WDL  Hallucination None reported or observed  Judgment Poor  Confusion None  Danger to Self  Current suicidal ideation? Denies  Agreement Not to Harm Self Yes  Description of Agreement Verbal contract for safety  Danger to Others  Danger to Others None reported or observed

## 2023-11-12 NOTE — Progress Notes (Signed)
   11/12/23 0900  Psych Admission Type (Psych Patients Only)  Admission Status Involuntary  Psychosocial Assessment  Patient Complaints None  Eye Contact Fair  Facial Expression Anxious;Flat;Worried  Affect Anxious;Depressed  Speech Logical/coherent  Interaction Cautious;Guarded  Motor Activity Other (Comment) (WNL)  Appearance/Hygiene Unremarkable  Behavior Characteristics Cooperative  Mood Depressed;Anxious  Thought Process  Coherency Disorganized  Content Preoccupation  Delusions None reported or observed  Perception WDL  Hallucination None reported or observed  Judgment Poor  Confusion None  Danger to Self  Current suicidal ideation? Denies  Agreement Not to Harm Self Yes  Description of Agreement verbal  Danger to Others  Danger to Others None reported or observed

## 2023-11-12 NOTE — [facility] Group Notes (Signed)
 [facility] Group Notes:  (Nursing/MHT/Case Management/Adjunct)  Date:  11/12/2023  Time:  2000  Type of Therapy:   Wrap up group  Participation Level:  Active  Participation Quality:  Appropriate, Attentive, and Sharing  Affect:  Blunted and Flat  Cognitive:  Alert  Insight:  Lacking  Engagement in Group:  Developing/Improving  Modes of Intervention:  Clarification, Education, and Socialization  Summary of Progress/Problems: Positive thinking and self-care were discussed.   Lenora Manuelita RAMAN 11/12/2023, 9:38 PM

## 2023-11-12 NOTE — Plan of Care (Signed)
   Problem: Education: Goal: Knowledge of [facility] Education information/materials will improve Outcome: Progressing Goal: Emotional status will improve Outcome: Progressing Goal: Mental status will improve Outcome: Progressing Goal: Verbalization of understanding the information provided will improve Outcome: Progressing   Problem: Coping: Goal: Ability to verbalize frustrations and anger appropriately will improve Outcome: Progressing Goal: Ability to demonstrate self-control will improve Outcome: Progressing

## 2023-11-12 NOTE — Progress Notes (Signed)
 [facility] MD Progress Note  11/12/2023 2:06 PM Barbara Gordon  MRN:  [id number]  Reason for admission: 33 yr old female who presented on 12/27 to [facility] after a Suicide Attempt (OD- Seroquel , Gabapentin ) and bizarre behavior, she was admitted to [facility] on 12/31.  PPHx is significant for Schizophrenia, MDD, Borderline Personality Disorder, and a history of Polysubstance Abuse (Amphetamines, Opioids, THC), and Multiple Prior Suicide Attempts (OD) and Multiple Prior Psychiatric Hospitalizations (last- [facility] 11/2022).   Nursing updates: Patient still a bit bizarre asking a lot of repeat questions, but less disorganized than day prior. No behavior concerns warranting agitation protocol. PRNs used Hydroyxzine.  Daily notes: Jaselle interviewed from her bed today. Patient reports that her thoughts do feel a bit cloudy but less so than yesterday. Patient reports she feels something is wrong but is not sure exactly what is. Patient reports that she did sleep ok last night and her appetite is ok. Patient reports that she feels a bit scared but is not sure why. Patient denies feeling depressed. Patient and provider discuss what brought her to the hospital and her friend tyler. Patient reports that she can try to call him today, since she has not contacted him since hospitalization. Patient also reports she will try to go to some groups today. Patient denies SI, HI, and AVH today.    Principal Problem: Disorganized schizophrenia, chronic condition (HCC)  Diagnosis: Principal Problem:   Disorganized schizophrenia, chronic condition (HCC)  Total Time spent with patient:  35 minutes  Past Psychiatric History: See H&P.  Past Medical History:  Past Medical History:  Diagnosis Date   Hypothyroidism    Polysubstance abuse (HCC)    Schizophrenia (HCC)    History reviewed. No pertinent surgical history.  Family History: History reviewed. No pertinent family history.  Family Psychiatric  History: See H&P.  Social  History:  Social History   Substance and Sexual Activity  Alcohol Use Not Currently   Comment: denies     Social History   Substance and Sexual Activity  Drug Use Not Currently   Types: Cocaine, Amphetamines, Methamphetamines, Marijuana   Comment: denies    Social History   Socioeconomic History   Marital status: Single    Spouse name: Not on file   Number of children: Not on file   Years of education: Not on file   Highest education level: Not on file  Occupational History   Not on file  Tobacco Use   Smoking status: Some Days    Current packs/day: 1.00    Types: Cigarettes, E-cigarettes   Smokeless tobacco: Never  Vaping Use   Vaping status: Never Used  Substance and Sexual Activity   Alcohol use: Not Currently    Comment: denies   Drug use: Not Currently    Types: Cocaine, Amphetamines, Methamphetamines, Marijuana    Comment: denies   Sexual activity: Not Currently  Other Topics Concern   Not on file  Social History Narrative   Not on file   Social Drivers of Health   Financial Resource Strain: Low Risk  (11/20/2022)   Received from [facility]   Overall Financial Resource Strain (CARDIA)    Difficulty of Paying Living Expenses: Not very hard  Food Insecurity: Patient Unable To Answer (11/07/2023)   Hunger Vital Sign    Worried About Running Out of Food in the Last Year: Patient unable to answer    Ran Out of Food in the Last Year: Patient unable to answer  Transportation Needs: Patient  Unable To Answer (11/07/2023)   PRAPARE - Transportation    Lack of Transportation (Medical): Patient unable to answer    Lack of Transportation (Non-Medical): Patient unable to answer  Recent Concern: Transportation Needs - Unmet Transportation Needs (11/06/2023)   PRAPARE - Transportation    Lack of Transportation (Medical): Yes    Lack of Transportation (Non-Medical): Yes  Physical Activity: Not on file  Stress: No Stress Concern Present (11/20/2022)   Received from  [facility] - Occupational Stress Questionnaire    Feeling of Stress : Only a little  Social Connections: Unknown (03/09/2022)   Received from [facility]   Social Network    Social Network: Not on file   Additional Social History:   Sleep: Good  Appetite:  Good  Current Medications: Current Facility-Administered Medications  Medication Dose Route Frequency Provider Last Rate Last Admin   acetaminophen  (TYLENOL ) tablet 650 mg  650 mg Oral Q6H PRN Starkes-Perry, Takia S, FNP       alum & mag hydroxide-simeth (MAALOX/MYLANTA) 200-200-20 MG/5ML suspension 30 mL  30 mL Oral Q4H PRN Starkes-Perry, Takia S, FNP       haloperidol  (HALDOL ) tablet 5 mg  5 mg Oral TID PRN Johny Lot, MD   5 mg at 11/08/23 1426   And   diphenhydrAMINE  (BENADRYL ) capsule 50 mg  50 mg Oral TID PRN Johny Lot, MD   50 mg at 11/08/23 1426   haloperidol  lactate (HALDOL ) injection 5 mg  5 mg Intramuscular TID PRN Massengill, Lot, MD       And   diphenhydrAMINE  (BENADRYL ) injection 50 mg  50 mg Intramuscular TID PRN Massengill, Lot, MD       And   LORazepam  (ATIVAN ) injection 2 mg  2 mg Intramuscular TID PRN Massengill, Lot, MD       haloperidol  lactate (HALDOL ) injection 10 mg  10 mg Intramuscular TID PRN Massengill, Lot, MD       And   diphenhydrAMINE  (BENADRYL ) injection 50 mg  50 mg Intramuscular TID PRN Massengill, Lot, MD       And   LORazepam  (ATIVAN ) injection 2 mg  2 mg Intramuscular TID PRN Massengill, Lot, MD       gabapentin  (NEURONTIN ) capsule 300 mg  300 mg Oral QHS Starkes-Perry, Takia S, FNP   300 mg at 11/11/23 2116   hydrOXYzine  (ATARAX ) tablet 25 mg  25 mg Oral TID PRN Starkes-Perry, Takia S, FNP   25 mg at 11/12/23 1008   lisinopril  (ZESTRIL ) tablet 5 mg  5 mg Oral Daily Nwoko, Mac I, NP   5 mg at 11/12/23 0750   magnesium  hydroxide (MILK OF MAGNESIA) suspension 30 mL  30 mL Oral Daily PRN  Starkes-Perry, Takia S, FNP       melatonin tablet 5 mg  5 mg Oral QHS Starkes-Perry, Takia S, FNP   5 mg at 11/11/23 2116   nicotine  (NICODERM CQ  - dosed in mg/24 hours) patch 14 mg  14 mg Transdermal Daily Wilkie Majel RAMAN, FNP   14 mg at 11/12/23 0750   ondansetron  (ZOFRAN -ODT) disintegrating tablet 4 mg  4 mg Oral Q6H PRN Collene Mac I, NP   4 mg at 11/09/23 8165   QUEtiapine  (SEROQUEL ) tablet 50 mg  50 mg Oral QHS PRN Pashayan, Alexander S, MD       risperiDONE  (RISPERDAL ) tablet 2 mg  2 mg Oral QHS Nwoko, Agnes I, NP   2 mg at  11/11/23 2117    Lab Results:  No results found for this or any previous visit (from the past 48 hours).  Blood Alcohol level:  Lab Results  Component Value Date   ETH <10 11/04/2023   ETH <10 10/27/2023   Metabolic Disorder Labs: Lab Results  Component Value Date   HGBA1C 5.1 11/10/2023   MPG 100 11/10/2023   MPG 88.19 04/18/2022   Lab Results  Component Value Date   PROLACTIN 189.0 (H) 04/16/2022   PROLACTIN 2.6 (L) 01/15/2022   Lab Results  Component Value Date   CHOL 213 (H) 11/10/2023   TRIG 90 11/10/2023   HDL 52 11/10/2023   CHOLHDL 4.1 11/10/2023   VLDL 18 11/10/2023   LDLCALC 143 (H) 11/10/2023   LDLCALC 133 (H) 01/15/2022   Physical Findings: AIMS:  , ,  ,  ,    CIWA:    COWS:     Musculoskeletal: Strength & Muscle Tone: within normal limits Gait & Station: normal Patient leans: N/A  Psychiatric Specialty Exam:  Presentation  General Appearance:  Casual  Eye Contact: Good  Speech: Clear and Coherent  Speech Volume: Normal  Handedness: Right   Mood and Affect  Mood: -- (scared)  Affect: Flat  Thought Process  Thought Processes: Linear  Descriptions of Associations:-- (really concrete)  Orientation:Full (Time, Place and Person)  Thought Content:Logical  History of Schizophrenia/Schizoaffective disorder:Yes  Duration of Psychotic Symptoms:Greater than six  months  Hallucinations:Hallucinations: None  Ideas of Reference:None  Suicidal Thoughts:Suicidal Thoughts: No  Homicidal Thoughts:Homicidal Thoughts: No  Sensorium  Memory: Immediate Fair; Recent Poor  Judgment: Fair  Insight: Shallow  Executive Functions  Concentration: Fair  Attention Span: Fair  Recall: Fair  Fund of Knowledge: Fair  Language: Fair  Psychomotor Activity  Psychomotor Activity: Psychomotor Activity: Decreased  Assets  Assets: Desire for Improvement; Resilience  Sleep  Sleep: Sleep: Good Number of Hours of Sleep: 8  Physical Exam: Physical Exam Vitals and nursing note reviewed.  HENT:     Nose: Nose normal.  Pulmonary:     Effort: Pulmonary effort is normal.  Genitourinary:    Comments: Deferred Musculoskeletal:        General: Normal range of motion.  Skin:    General: Skin is warm and dry.  Neurological:     General: No focal deficit present.     Mental Status: She is alert and oriented to person, place, and time.    Review of Systems  Constitutional:  Negative for chills and fever.  Gastrointestinal:  Negative for abdominal pain, constipation, diarrhea and nausea.  Musculoskeletal:  Negative for joint pain and myalgias.  Neurological:  Negative for loss of consciousness and headaches.  Psychiatric/Behavioral:  Positive for substance abuse. Negative for hallucinations. The patient does not have insomnia.    Blood pressure 130/79, pulse (!) 102, temperature 98 F (36.7 C), temperature source Oral, resp. rate 16, height 5' 1 (1.549 m), weight 54.4 kg, last menstrual period 11/04/2023, SpO2 97%. Body mass index is 22.67 kg/m.  Treatment Plan Summary: Daily contact with patient to assess and evaluate symptoms and progress in treatment and Medication management.   Continue inpatient hospitalization.  Will continue today 11/12/2023 plan as below except where it is noted. Will continue to monitor patient on current dose,  there appears to be some improvement, patient having some difficult expressing herself and has this at baseline, will need to continue to monitor. Currently no objective concerns for AVH or paranoia. Will see how patient  interacts.   Schizophrenia: -Continue Risperdal   2 mg po Q HS for Psychosis -Continue Gabapentin  300 mg TID for anxiety -Continue Agitation Protocol: Haldol /Ativan /Benadryl .  -Continue Melatonin 5 mg po q hs for insomnia.  -Continue Seroquel  50 mg po Q hs prn for insomnia. -Continue Lisinopril  10 mg po once today for HTN (11-11-23).  -Continue Lisinopril  5 mg po daily for HTN (Start 11-12-23).   Nicotine  Dependence: -Continue Nicotine  Patch 14 mg daily   -Continue Melatonin 5 mg QHS for Insomnia -Change Seroquel  50 mg to QHS PRN for Insomnia -Continue PRN's: Tylenol , Maalox, Atarax , Milk of Magnesia, Trazodone   Reggie KATHEE Rice, MD 11/12/2023, 2:06 PM Patient ID: Royetta Sa, female   DOB: 1991-07-15, 33 y.o.   MRN: [id number] Patient ID: Falon Huesca, female   DOB: [birth year], 33 y.o.   MRN: [id number]

## 2023-11-13 MED ORDER — RISPERIDONE 3 MG PO TABS
3.0000 mg | ORAL_TABLET | Freq: Every day | ORAL | Status: DC
  Administered 2023-11-13 – 2023-11-14 (×2): 3 mg via ORAL
  Filled 2023-11-13 (×4): qty 1

## 2023-11-13 NOTE — Group Note (Signed)
 Date:  11/13/2023 Time:  6:15 PM  Group Topic/Focus:  Goals Group:   The focus of this group is to help patients establish daily goals to achieve during treatment and discuss how the patient can incorporate goal setting into their daily lives to aide in recovery. Orientation:   The focus of this group is to educate the patient on the purpose and policies of crisis stabilization and provide a format to answer questions about their admission.  The group details unit policies and expectations of patients while admitted.    Participation Level:  Active  Participation Quality:  Appropriate  Affect:  Appropriate  Cognitive:  Appropriate  Insight: Appropriate  Engagement in Group:  Engaged  Modes of Intervention:  Discussion  Additional Comments:    Barbara Gordon Barbara Gordon 11/13/2023, 6:15 PM

## 2023-11-13 NOTE — Progress Notes (Signed)
 D: Patient is alert, oriented, and cooperative. Denies SI, HI, AVH, and verbally contracts for safety. Patient reports she slept fair last night. Patient reports her appetite as good, energy level as hyper, and concentration as poor. Patient rates her depression 0/10, hopelessness 0/10, and anxiety 10/10. Patient denies physical symptoms/pain.    A: Scheduled medications administered per MD order. PRN hydroxyzine  administered. Support provided. Patient educated on safety on the unit and medications. Routine safety checks every 15 minutes. Patient stated understanding to tell nurse about any new physical symptoms. Patient understands to tell staff of any needs.     R: No adverse drug reactions noted. Patient remains safe at this time and will continue to monitor.    11/13/23 1100  Psych Admission Type (Psych Patients Only)  Admission Status Involuntary  Psychosocial Assessment  Patient Complaints Anxiety  Eye Contact Fair  Facial Expression Flat  Affect Depressed;Anxious  Speech Logical/coherent  Interaction Cautious;Guarded  Motor Activity Other (Comment) (WNL)  Appearance/Hygiene Unremarkable  Behavior Characteristics Cooperative  Mood Anxious;Depressed  Thought Process  Coherency WDL  Content WDL  Delusions None reported or observed  Perception WDL  Hallucination None reported or observed  Judgment Poor  Confusion None  Danger to Self  Current suicidal ideation? Denies  Agreement Not to Harm Self Yes  Description of Agreement verbal  Danger to Others  Danger to Others None reported or observed

## 2023-11-13 NOTE — Plan of Care (Signed)

## 2023-11-13 NOTE — [facility] Group Notes (Signed)
 Adult Psychoeducational Group Note  Date:  11/13/2023 Time:  9:09 PM  Group Topic/Focus:  Wrap-Up Group:   The focus of this group is to help patients review their daily goal of treatment and discuss progress on daily workbooks.  Participation Level:  Active  Participation Quality:  Appropriate  Affect:  Appropriate  Cognitive:  Appropriate  Insight: Appropriate  Engagement in Group:  Engaged  Modes of Intervention:  Discussion and Support  Additional Comments:  Pt told that today was a good day on the unit, the highlight of which was waking up this morning. On the subject of goals for the coming week, Pt discussed wanting to find a new living situation, which would generally improve her overall quality of life. Pt rated her day a 7 out of 10.  Barbara Gordon 11/13/2023, 9:09 PM

## 2023-11-13 NOTE — Progress Notes (Signed)
 [facility] MD Progress Note  11/13/2023 2:27 PM Adrielle Polakowski  MRN:  [id number]  Reason for admission: 33 yr old female who presented on 12/27 to [facility] after a Suicide Attempt (OD- Seroquel , Gabapentin ) and bizarre behavior, she was admitted to [facility] on 12/31.  PPHx is significant for Schizophrenia, MDD, Borderline Personality Disorder, and a history of Polysubstance Abuse (Amphetamines, Opioids, THC), and Multiple Prior Suicide Attempts (OD) and Multiple Prior Psychiatric Hospitalizations (last- [facility] 11/2022).   Nursing updates: Patient still a bit bizarre, tried to attend some groups yesterday. No behavior concerns warranting agitation protocol. PRNs used Hydroyxzine.  Daily notes: Patient reported something doesn't feel right, can I go lie down? Patient reported this about 3 times throughout the assessment. Initially provider went through the review of systems with patient but patient denies physical pain. Provider also allowed patient to go back to lie down, but patient only lied down the final time and was seen right back up in the hallway after assessment concluded. Patient appears to keep to herself mostly. Patient also asked if she could throw her cup away once, before she lied down.  Eventually during ROS to assess if patient was having physical discomfort, patient reported that she feels like someone killed her family. Patient reported that she is not sure how long she has had this thought or who would do this, but endorses that she does feel anxious. Patient denies specific triggers for her anxiety. Patient reported that she never called [name] yesterday and is not able to give a reason as to why. Patient denies SI, HI, and AVH. Patient reports that despite feeling a bit of anxiety, she overall feels her mood is alright and she is otherwise sleeping and eating well.    Principal Problem: Disorganized schizophrenia, chronic condition (HCC)  Diagnosis: Principal Problem:   Disorganized  schizophrenia, chronic condition (HCC)  Total Time spent with patient:  35 minutes  Past Psychiatric History: See H&P.  Past Medical History:  Past Medical History:  Diagnosis Date   Hypothyroidism    Polysubstance abuse (HCC)    Schizophrenia (HCC)    History reviewed. No pertinent surgical history.  Family History: History reviewed. No pertinent family history.  Family Psychiatric  History: See H&P.  Social History:  Social History   Substance and Sexual Activity  Alcohol Use Not Currently   Comment: denies     Social History   Substance and Sexual Activity  Drug Use Not Currently   Types: Cocaine, Amphetamines, Methamphetamines, Marijuana   Comment: denies    Social History   Socioeconomic History   Marital status: Single    Spouse name: Not on file   Number of children: Not on file   Years of education: Not on file   Highest education level: Not on file  Occupational History   Not on file  Tobacco Use   Smoking status: Some Days    Current packs/day: 1.00    Types: Cigarettes, E-cigarettes   Smokeless tobacco: Never  Vaping Use   Vaping status: Never Used  Substance and Sexual Activity   Alcohol use: Not Currently    Comment: denies   Drug use: Not Currently    Types: Cocaine, Amphetamines, Methamphetamines, Marijuana    Comment: denies   Sexual activity: Not Currently  Other Topics Concern   Not on file  Social History Narrative   Not on file   Social Drivers of Health   Financial Resource Strain: Low Risk  (11/20/2022)   Received from [facility]   Overall Financial Resource Strain (CARDIA)    Difficulty of Paying Living Expenses: Not very hard  Food Insecurity: Patient Unable To Answer (11/07/2023)   Hunger Vital Sign    Worried About Running Out of Food in the Last Year: Patient unable to answer    Ran Out of Food in the Last Year: Patient unable to answer  Transportation Needs: Patient Unable To Answer (11/07/2023)   PRAPARE -  Transportation    Lack of Transportation (Medical): Patient unable to answer    Lack of Transportation (Non-Medical): Patient unable to answer  Recent Concern: Transportation Needs - Unmet Transportation Needs (11/06/2023)   PRAPARE - Transportation    Lack of Transportation (Medical): Yes    Lack of Transportation (Non-Medical): Yes  Physical Activity: Not on file  Stress: No Stress Concern Present (11/20/2022)   Received from [facility] - Occupational Stress Questionnaire    Feeling of Stress : Only a little  Social Connections: Unknown (03/09/2022)   Received from [facility], [facility]   Social Network    Social Network: Not on file   Additional Social History:   Sleep: Good  Appetite:  Good  Current Medications: Current Facility-Administered Medications  Medication Dose Route Frequency Provider Last Rate Last Admin   acetaminophen  (TYLENOL ) tablet 650 mg  650 mg Oral Q6H PRN Starkes-Perry, Takia S, FNP       alum & mag hydroxide-simeth (MAALOX/MYLANTA) 200-200-20 MG/5ML suspension 30 mL  30 mL Oral Q4H PRN Starkes-Perry, Takia S, FNP       haloperidol  (HALDOL ) tablet 5 mg  5 mg Oral TID PRN Johny Lot, MD   5 mg at 11/08/23 1426   And   diphenhydrAMINE  (BENADRYL ) capsule 50 mg  50 mg Oral TID PRN Johny Lot, MD   50 mg at 11/08/23 1426   haloperidol  lactate (HALDOL ) injection 5 mg  5 mg Intramuscular TID PRN Massengill, Lot, MD       And   diphenhydrAMINE  (BENADRYL ) injection 50 mg  50 mg Intramuscular TID PRN Massengill, Lot, MD       And   LORazepam  (ATIVAN ) injection 2 mg  2 mg Intramuscular TID PRN Massengill, Lot, MD       haloperidol  lactate (HALDOL ) injection 10 mg  10 mg Intramuscular TID PRN Massengill, Lot, MD       And   diphenhydrAMINE  (BENADRYL ) injection 50 mg  50 mg Intramuscular TID PRN Massengill, Lot, MD       And   LORazepam  (ATIVAN ) injection 2 mg  2 mg Intramuscular TID  PRN Massengill, Lot, MD       gabapentin  (NEURONTIN ) capsule 300 mg  300 mg Oral QHS Starkes-Perry, Takia S, FNP   300 mg at 11/12/23 2108   hydrOXYzine  (ATARAX ) tablet 25 mg  25 mg Oral TID PRN Starkes-Perry, Takia S, FNP   25 mg at 11/13/23 9066   lisinopril  (ZESTRIL ) tablet 5 mg  5 mg Oral Daily Nwoko, Agnes I, NP   5 mg at 11/13/23 0821   magnesium  hydroxide (MILK OF MAGNESIA) suspension 30 mL  30 mL Oral Daily PRN Starkes-Perry, Takia S, FNP       melatonin tablet 5 mg  5 mg Oral QHS Starkes-Perry, Takia S, FNP   5 mg at 11/12/23 2108   nicotine  (NICODERM CQ  - dosed in mg/24 hours) patch 14 mg  14 mg Transdermal Daily Wilkie Majel RAMAN, FNP   14 mg at 11/13/23  9178   ondansetron  (ZOFRAN -ODT) disintegrating tablet 4 mg  4 mg Oral Q6H PRN Collene Gouge I, NP   4 mg at 11/09/23 1834   QUEtiapine  (SEROQUEL ) tablet 50 mg  50 mg Oral QHS PRN Pashayan, Alexander S, MD       risperiDONE  (RISPERDAL ) tablet 3 mg  3 mg Oral QHS Salvator Seppala B, MD        Lab Results:  No results found for this or any previous visit (from the past 48 hours).  Blood Alcohol level:  Lab Results  Component Value Date   ETH <10 11/04/2023   ETH <10 10/27/2023   Metabolic Disorder Labs: Lab Results  Component Value Date   HGBA1C 5.1 11/10/2023   MPG 100 11/10/2023   MPG 88.19 04/18/2022   Lab Results  Component Value Date   PROLACTIN 189.0 (H) 04/16/2022   PROLACTIN 2.6 (L) 01/15/2022   Lab Results  Component Value Date   CHOL 213 (H) 11/10/2023   TRIG 90 11/10/2023   HDL 52 11/10/2023   CHOLHDL 4.1 11/10/2023   VLDL 18 11/10/2023   LDLCALC 143 (H) 11/10/2023   LDLCALC 133 (H) 01/15/2022   Physical Findings: AIMS:  , ,  ,  ,    CIWA:    COWS:     Musculoskeletal: Strength & Muscle Tone: within normal limits Gait & Station: normal Patient leans: N/A  Psychiatric Specialty Exam:  Presentation  General Appearance:  Casual  Eye Contact: Fair  Speech: Clear and Coherent  Speech  Volume: Normal  Handedness: Right   Mood and Affect  Mood: Anxious  Affect: Blunt  Thought Process  Thought Processes: Goal Directed  Descriptions of Associations:Intact  Orientation:Full (Time, Place and Person)  Thought Content:Perseveration; Paranoid Ideation  History of Schizophrenia/Schizoaffective disorder:Yes  Duration of Psychotic Symptoms:Greater than six months  Hallucinations:Hallucinations: None  Ideas of Reference:Paranoia  Suicidal Thoughts:Suicidal Thoughts: No  Homicidal Thoughts:Homicidal Thoughts: No  Sensorium  Memory: Immediate Fair; Recent Fair  Judgment: Impaired  Insight: Shallow  Executive Functions  Concentration: Poor  Attention Span: Poor  Recall: Poor  Fund of Knowledge: Poor  Language: Fair  Psychomotor Activity  Psychomotor Activity: Psychomotor Activity: Decreased  Assets  Assets: Desire for Improvement; Resilience  Sleep  Sleep: Sleep: Good Number of Hours of Sleep: 8.5  Physical Exam: Physical Exam Vitals and nursing note reviewed.  HENT:     Nose: Nose normal.  Pulmonary:     Effort: Pulmonary effort is normal.  Genitourinary:    Comments: Deferred Musculoskeletal:        General: Normal range of motion.  Skin:    General: Skin is warm and dry.  Neurological:     General: No focal deficit present.     Mental Status: She is alert and oriented to person, place, and time.    Review of Systems  Constitutional:  Negative for chills and fever.  Gastrointestinal:  Negative for abdominal pain, constipation, diarrhea and nausea.  Musculoskeletal:  Negative for joint pain and myalgias.  Neurological:  Negative for loss of consciousness and headaches.  Psychiatric/Behavioral:  Positive for substance abuse. Negative for hallucinations. The patient does not have insomnia.    Blood pressure 130/81, pulse 88, temperature 98 F (36.7 C), temperature source Oral, resp. rate 16, height 5' 1 (1.549  m), weight 54.4 kg, last menstrual period 11/04/2023, SpO2 99%. Body mass index is 22.67 kg/m.  Treatment Plan Summary: Daily contact with patient to assess and evaluate symptoms and progress in treatment  and Medication management.   Continue inpatient hospitalization.  Will continue today 11/13/2023 plan as below except where it is noted. Will increase risperdal , as it appears patient is having paranoia and this may be why she feels off. Patient also continues to ask questions about basic tasks, indicating possible continued thought blocking.   Schizophrenia: -Increase Risperdal   to 3 mg po Q HS for Psychosis -Continue Gabapentin  300 mg TID for anxiety -Continue Agitation Protocol: Haldol /Ativan /Benadryl .  -Continue Melatonin 5 mg po q hs for insomnia.  -Continue Seroquel  50 mg po Q hs prn for insomnia. -Continue Lisinopril  10 mg po once today for HTN (11-11-23).  -Continue Lisinopril  5 mg po daily for HTN (Start 11-12-23).   Nicotine  Dependence: -Continue Nicotine  Patch 14 mg daily   -Continue Melatonin 5 mg QHS for Insomnia -Change Seroquel  50 mg to QHS PRN for Insomnia -Continue PRN's: Tylenol , Maalox, Atarax , Milk of Magnesia, Trazodone   Reggie KATHEE Rice, MD 11/13/2023, 2:27 PM Patient ID: Royetta Sa, female   DOB: 27-Jul-1991, 33 y.o.   MRN: [id number] Patient ID: Kalese Ensz, female   DOB: 12/23/1990, 34 y.o.   MRN: [id number]

## 2023-11-13 NOTE — [facility] Group Notes (Signed)
 [facility] Group Notes:  (Nursing)  Date:  11/13/2023  Time:  1400  Type of Therapy:  Psychoeducational Skills  Participation Level:  Did Not Attend   Shela Nevin 11/13/2023, 4:00 PM

## 2023-11-13 NOTE — Group Note (Signed)
 LCSW Group Therapy Note  Group Date: 11/13/2023 Start Time: 1000 End Time: 1100   Type of Therapy and Topic:  Group Therapy: Wellness/ Positive Affirmations  Participation Level:  Active   Description of Group:   This group addressed positive affirmation towards self and others.  Patients went around the room and identified two positive things about themselves and two positive things about a peer in the room.  Patients reflected on how it felt to share something positive with others, to identify positive things about themselves, and to hear positive things from others/ Patients were encouraged to have a daily reflection of positive characteristics or circumstances.   Therapeutic Goals: Patients will verbalize two of their positive qualities Patients will demonstrate empathy for others by stating two positive qualities about a peer in the group Patients will verbalize their feelings when voicing positive self affirmations and when voicing positive affirmations of others Patients will discuss the potential positive impact on their wellness/recovery of focusing on positive traits of self and others.  Summary of Patient Progress:  was invited, did not attend  Therapeutic Modalities:   Cognitive Behavioral Therapy Motivational Interviewing    Golda Louder, LCSWA 11/13/2023  3:07 PM

## 2023-11-14 ENCOUNTER — Encounter (HOSPITAL_COMMUNITY): Payer: Self-pay

## 2023-11-14 DIAGNOSIS — F201 Disorganized schizophrenia: Secondary | ICD-10-CM | POA: Diagnosis not present

## 2023-11-14 MED ORDER — FLUOXETINE HCL 10 MG PO CAPS
10.0000 mg | ORAL_CAPSULE | Freq: Every day | ORAL | Status: DC
  Administered 2023-11-14 – 2023-11-15 (×2): 10 mg via ORAL
  Filled 2023-11-14 (×5): qty 1

## 2023-11-14 NOTE — Group Note (Signed)
 Recreation Therapy Group Note   Group Topic:Problem Solving  Group Date: 11/14/2023 Start Time: 0940 End Time: 1011 Facilitators: Maude Hettich-McCall, LRT,CTRS Location: [address] Hall Dayroom   Group Topic: Communication, Team Building, Problem Solving  Goal Area(s) Addresses:  Patient will effectively work with peer towards shared goal.  Patient will identify skills used to make activity successful.  Patient will identify how skills used during activity can be applied to reach post d/c goals.   Intervention: STEM Activity- Glass Blower/designer  Group Description: Tallest Pharmacist, Community. In teams of 5-6, patients were given 11 craft pipe cleaners. Using the materials provided, patients were instructed to compete again the opposing team(s) to build the tallest free-standing structure from floor level. The activity was timed; difficulty increased by clinical research associate as production designer, theatre/television/film continued.  Systematically resources were removed with additional directions for example, placing one arm behind their back, working in silence, and shape stipulations. LRT facilitated post-activity discussion reviewing team processes and necessary communication skills involved in completion. Patients were encouraged to reflect how the skills utilized, or not utilized, in this activity can be incorporated to positively impact support systems post discharge.  Education: Pharmacist, Community, Scientist, Physiological, Discharge Planning   Education Outcome: Acknowledges education/In group clarification offered/Needs additional education.    Affect/Mood: N/A   Participation Level: Did not attend    Clinical Observations/Individualized Feedback:     Plan: Continue to engage patient in RT group sessions 2-3x/week.   Volanda Mangine-McCall, LRT,CTRS 11/14/2023 12:31 PM

## 2023-11-14 NOTE — BH IP Treatment Plan (Signed)
 Interdisciplinary Treatment and Diagnostic Plan Update  11/14/2023 Time of Session: 12:00PM - Barbara Gordon MRN: [id number]  Principal Diagnosis: Disorganized schizophrenia, chronic condition (HCC)  Secondary Diagnoses: Principal Problem:   Disorganized schizophrenia, chronic condition (HCC)   Current Medications:  Current Facility-Administered Medications  Medication Dose Route Frequency Provider Last Rate Last Admin   acetaminophen  (TYLENOL ) tablet 650 mg  650 mg Oral Q6H PRN Starkes-Perry, Takia S, FNP       alum & mag hydroxide-simeth (MAALOX/MYLANTA) 200-200-20 MG/5ML suspension 30 mL  30 mL Oral Q4H PRN Starkes-Perry, Takia S, FNP       haloperidol  (HALDOL ) tablet 5 mg  5 mg Oral TID PRN Johny Lot, MD   5 mg at 11/08/23 1426   And   diphenhydrAMINE  (BENADRYL ) capsule 50 mg  50 mg Oral TID PRN Johny Lot, MD   50 mg at 11/08/23 1426   haloperidol  lactate (HALDOL ) injection 5 mg  5 mg Intramuscular TID PRN Johny Lot, MD       And   diphenhydrAMINE  (BENADRYL ) injection 50 mg  50 mg Intramuscular TID PRN Massengill, Lot, MD       And   LORazepam  (ATIVAN ) injection 2 mg  2 mg Intramuscular TID PRN Massengill, Lot, MD       haloperidol  lactate (HALDOL ) injection 10 mg  10 mg Intramuscular TID PRN Massengill, Lot, MD       And   diphenhydrAMINE  (BENADRYL ) injection 50 mg  50 mg Intramuscular TID PRN Massengill, Lot, MD       And   LORazepam  (ATIVAN ) injection 2 mg  2 mg Intramuscular TID PRN Massengill, Lot, MD       gabapentin  (NEURONTIN ) capsule 300 mg  300 mg Oral QHS Starkes-Perry, Takia S, FNP   300 mg at 11/13/23 2115   hydrOXYzine  (ATARAX ) tablet 25 mg  25 mg Oral TID PRN Starkes-Perry, Takia S, FNP   25 mg at 11/14/23 9141   lisinopril  (ZESTRIL ) tablet 5 mg  5 mg Oral Daily Nwoko, Agnes I, NP   5 mg at 11/14/23 9262   magnesium  hydroxide (MILK OF MAGNESIA) suspension 30 mL  30 mL Oral Daily PRN Starkes-Perry, Takia S, FNP        melatonin tablet 5 mg  5 mg Oral QHS Starkes-Perry, Takia S, FNP   5 mg at 11/13/23 2115   nicotine  (NICODERM CQ  - dosed in mg/24 hours) patch 14 mg  14 mg Transdermal Daily Wilkie Majel RAMAN, FNP   14 mg at 11/14/23 9262   ondansetron  (ZOFRAN -ODT) disintegrating tablet 4 mg  4 mg Oral Q6H PRN Collene Gouge I, NP   4 mg at 11/09/23 1834   QUEtiapine  (SEROQUEL ) tablet 50 mg  50 mg Oral QHS PRN Pashayan, Alexander S, MD   50 mg at 11/13/23 2115   risperiDONE  (RISPERDAL ) tablet 3 mg  3 mg Oral QHS McQuilla, Jai B, MD   3 mg at 11/13/23 2115   PTA Medications: Medications Prior to Admission  Medication Sig Dispense Refill Last Dose/Taking   gabapentin  (NEURONTIN ) 100 MG capsule Take 3 capsules (300 mg total) by mouth at bedtime. 60 capsule 0    OLANZapine  (ZYPREXA ) 5 MG tablet Take 5 mg by mouth at bedtime.      QUEtiapine  (SEROQUEL ) 50 MG tablet Take 1 tablet (50 mg total) by mouth at bedtime. 30 tablet 0     Patient Stressors: Medication change or noncompliance   Other: Patient unable to identify any stressors    Patient  Strengths: Careers Information Officer for treatment/growth  Supportive family/friends   Treatment Modalities: Medication Management, Group therapy, Case management,  1 to 1 session with clinician, Psychoeducation, Recreational therapy.   Physician Treatment Plan for Primary Diagnosis: Disorganized schizophrenia, chronic condition (HCC) Long Term Goal(s): Improvement in symptoms so as ready for discharge   Short Term Goals: Ability to maintain clinical measurements within normal limits will improve Compliance with prescribed medications will improve Ability to identify triggers associated with substance abuse/mental health issues will improve  Medication Management: Evaluate patient's response, side effects, and tolerance of medication regimen.  Therapeutic Interventions: 1 to 1 sessions, Unit Group sessions and Medication  administration.  Evaluation of Outcomes: Progressing  Physician Treatment Plan for Secondary Diagnosis: Principal Problem:   Disorganized schizophrenia, chronic condition (HCC)  Long Term Goal(s): Improvement in symptoms so as ready for discharge   Short Term Goals: Ability to maintain clinical measurements within normal limits will improve Compliance with prescribed medications will improve Ability to identify triggers associated with substance abuse/mental health issues will improve     Medication Management: Evaluate patient's response, side effects, and tolerance of medication regimen.  Therapeutic Interventions: 1 to 1 sessions, Unit Group sessions and Medication administration.  Evaluation of Outcomes: Progressing   RN Treatment Plan for Primary Diagnosis: Disorganized schizophrenia, chronic condition (HCC) Long Term Goal(s): Knowledge of disease and therapeutic regimen to maintain health will improve  Short Term Goals: Ability to remain free from injury will improve, Ability to verbalize frustration and anger appropriately will improve, Ability to participate in decision making will improve, Ability to verbalize feelings will improve, Ability to disclose and discuss suicidal ideas, and Compliance with prescribed medications will improve  Medication Management: RN will administer medications as ordered by provider, will assess and evaluate patient's response and provide education to patient for prescribed medication. RN will report any adverse and/or side effects to prescribing provider.  Therapeutic Interventions: 1 on 1 counseling sessions, Psychoeducation, Medication administration, Evaluate responses to treatment, Monitor vital signs and CBGs as ordered, Perform/monitor CIWA, COWS, AIMS and Fall Risk screenings as ordered, Perform wound care treatments as ordered.  Evaluation of Outcomes: Progressing   LCSW Treatment Plan for Primary Diagnosis: Disorganized schizophrenia,  chronic condition (HCC) Long Term Goal(s): Safe transition to appropriate next level of care at discharge, Engage patient in therapeutic group addressing interpersonal concerns.  Short Term Goals: Engage patient in aftercare planning with referrals and resources, Increase social support, Increase ability to appropriately verbalize feelings, Facilitate acceptance of mental health diagnosis and concerns, Facilitate patient progression through stages of change regarding substance use diagnoses and concerns, and Identify triggers associated with mental health/substance abuse issues  Therapeutic Interventions: Assess for all discharge needs, 1 to 1 time with Social worker, Explore available resources and support systems, Assess for adequacy in community support network, Educate family and significant other(s) on suicide prevention, Complete Psychosocial Assessment, Interpersonal group therapy.  Evaluation of Outcomes: Progressing   Progress in Treatment: Attending groups: Yes. Participating in groups: Yes.   Taking medication as prescribed: Yes. Toleration medication: Yes. Family/Significant other contact made: declined consents Patient understands diagnosis: Yes. Discussing patient identified problems/goals with staff: Yes. Medical problems stabilized or resolved: Yes. Denies suicidal/homicidal ideation: Yes. Issues/concerns per patient self-inventory: No.   New problem(s) identified:  No   New Short Term/Long Term Goal(s):     medication stabilization, elimination of SI thoughts, development of comprehensive mental wellness plan.     Patient Goals: They said  I took too many sleeping pills. I had crazy thoughts.  I thought we were drinking blood.  I thought somebody killed my kid.  I'm staying with my friend and his mom, but he is controlling.  Sometimes he is mean and has anger problems.  They let me stay there for free.  Patient said that stress related to living in this environment  contributed to her symptoms.  She would like to go to a sober living home or a home through [address] [employer].   Patient said that he has a 58 year old child who stays with his grandma.  I still have custody.  I don't have any income.  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    Discharge Plan or Barriers:  Patient recently admitted. CSW will continue to follow and assess for appropriate referrals and possible discharge planning.     Reason for Continuation of Hospitalization: Hallucinations Medication stabilization Suicidal ideation   Estimated Length of Stay:  5 - 7 days  Last 3 [place] Suicide Severity Risk Score: Flowsheet Row [facility]Current[facility] Most recent reading at 11/07/2023 10:00 PM ED to Hosp-Admission (Discharged) from 11/04/2023 in [facility] LONG 4TH FLOOR PROGRESSIVE CARE AND UROLOGY Most recent reading at 11/06/2023  4:33 AM ED from 11/04/2023 in [facility] Most recent reading at 11/04/2023  8:17 PM  C-SSRS RISK CATEGORY Low Risk High Risk Low Risk       Last PHQ 2/9 Scores:    06/27/2023    3:05 PM 06/06/2023    3:40 PM 07/01/2022    3:49 PM  Depression screen PHQ 2/9  Decreased Interest 0 0 0  Down, Depressed, Hopeless 0 0 0  PHQ - 2 Score 0 0 0  Altered sleeping   0  Tired, decreased energy   0  Change in appetite   0  Feeling bad or failure about yourself    0  Trouble concentrating   0  Moving slowly or fidgety/restless   0  Suicidal thoughts   0  PHQ-9 Score   0    Scribe for Treatment Team: Jenkins LULLA Primer, [place] 11/14/2023 1:56 PM

## 2023-11-14 NOTE — Progress Notes (Signed)
 D: Patient is alert, oriented, and cooperative. Denies SI, HI, AVH, and verbally contracts for safety.    A: Scheduled medications administered per MD order. PRN hydroxyzine  administered. Support provided. Patient educated on safety on the unit and medications. Routine safety checks every 15 minutes. Patient stated understanding to tell nurse about any new physical symptoms. Patient understands to tell staff of any needs.     R: No adverse drug reactions noted. Patient remains safe at this time and will continue to monitor.   11/14/23 1400  Psych Admission Type (Psych Patients Only)  Admission Status Involuntary  Psychosocial Assessment  Patient Complaints Anxiety;Depression  Eye Contact Fair  Facial Expression Flat  Affect Anxious;Depressed;Flat  Speech Logical/coherent  Interaction Guarded;Cautious  Motor Activity Other (Comment) (WNL)  Appearance/Hygiene Unremarkable  Behavior Characteristics Cooperative;Anxious  Mood Depressed;Anxious;Preoccupied  Thought Process  Coherency WDL  Content Preoccupation  Delusions None reported or observed  Perception WDL  Hallucination None reported or observed  Judgment Poor  Confusion None  Danger to Self  Current suicidal ideation? Denies  Agreement Not to Harm Self Yes  Description of Agreement verbal  Danger to Others  Danger to Others None reported or observed

## 2023-11-14 NOTE — Progress Notes (Signed)
 Pt reported trouble falling asleep and was administered her PRN seroquel  50 mg po at 2115 for insomnia per her request. Pt fell asleep around 2300. She rated her anxiety and depression a 5 on a scale of 0-10 (10 being the worst). When asked about stressors, pt was guarded. She denies experiencing any side effects from her medications. Pt is cautious with her interactions and appears paranoid. Pt denies SI/HI and AVH. Active listening, reassurance, and support provided. Every 15 minutes safety checks continue. Pt's safety has been maintained.   11/13/23 2115  Psych Admission Type (Psych Patients Only)  Admission Status Involuntary  Psychosocial Assessment  Patient Complaints Insomnia;Anxiety;Depression  Eye Contact Fair  Facial Expression Flat  Affect Anxious;Depressed;Flat  Speech Logical/coherent  Interaction Forwards little;Minimal;Guarded;Cautious  Motor Activity Other (Comment) (WDL)  Appearance/Hygiene Unremarkable  Behavior Characteristics Cooperative;Appropriate to situation;Anxious  Mood Anxious;Depressed;Preoccupied  Thought Process  Coherency WDL  Content Preoccupation  Delusions None reported or observed  Perception WDL  Hallucination None reported or observed  Judgment Poor  Confusion None  Danger to Self  Current suicidal ideation? Denies  Agreement Not to Harm Self Yes  Description of Agreement verbally contracts for safety  Danger to Others  Danger to Others None reported or observed

## 2023-11-14 NOTE — Plan of Care (Signed)

## 2023-11-14 NOTE — Progress Notes (Signed)
   11/14/23 0545  15 Minute Checks  Location Bedroom  Visual Appearance Calm  Behavior Sleeping  Sleep (Behavioral Health Patients Only)  Calculate sleep? (Click Yes once per 24 hr at 0600 safety check) Yes  Documented sleep last 24 hours 7.5

## 2023-11-14 NOTE — Group Note (Signed)
 Occupational Therapy Group Note  Group Topic:Coping Skills  Group Date: 11/14/2023 Start Time: 1430 End Time: 1509 Facilitators: Dot [place] MATSU, OT   Group Description: Group encouraged increased engagement and participation through discussion and activity focused on Coping Ahead. Patients were split up into teams and selected a card from a stack of positive coping strategies. Patients were instructed to act out/charade the coping skill for other peers to guess and receive points for their team. Discussion followed with a focus on identifying additional positive coping strategies and patients shared how they were going to cope ahead over the weekend while continuing hospitalization stay.  Therapeutic Goal(s): Identify positive vs negative coping strategies. Identify coping skills to be used during hospitalization vs coping skills outside of hospital/at home Increase participation in therapeutic group environment and promote engagement in treatment   Participation Level: Engaged   Participation Quality: Independent   Behavior: Appropriate   Speech/Thought Process: Relevant   Affect/Mood: Flat   Insight: Fair   Judgement: Fair      Modes of Intervention: Education  Patient Response to Interventions:  Attentive   Plan: Continue to engage patient in OT groups 2 - 3x/week.  11/14/2023  [place] MATSU Dot, OT Barbara Gordon, OT

## 2023-11-14 NOTE — [facility] Group Notes (Signed)
 [facility] Group Notes:  (Nursing/MHT/Case Management/Adjunct)  Date:  11/14/2023  Time:  2000  Type of Therapy:   wrap up group  Participation Level:  did not attend  Participation Quality:  Did not attend  Affect:  Did not attend  Cognitive:  Did not attend  Insight:  none  Engagement in Group:  Did not attend  Modes of Intervention:  Did not attend  Summary of Progress/Problems: Pt were aware of group time. Pt didn't want to attend  Barbara Gordon 11/14/2023, 9:39 PM

## 2023-11-14 NOTE — Progress Notes (Addendum)
 [facility] MD Progress Note  11/14/2023 2:29 PM Barbara Gordon  MRN:  [id number]  Reason for admission: 33 yr old female who presented on 12/27 to [facility] after a Suicide Attempt (OD- Seroquel , Gabapentin ) and bizarre behavior, she was admitted to [facility] on 12/31.  PPHx is significant for Schizophrenia, MDD, Borderline Personality Disorder, and a history of Polysubstance Abuse (Amphetamines, Opioids, THC), and Multiple Prior Suicide Attempts (OD) and Multiple Prior Psychiatric Hospitalizations (last- [facility] 11/2022).   24 hr chart review: Vitals within normal limits, patient slept 7.5 hours as per nursing documentation, and she is compliant with medications. Required Hydroxyzine  & Seroquel  as PRNs in the past 24 hrs. Slept for 7.5 hrs last night as per nursing reports and documentation. Has attended some unit group sessions in the past 24 hrs.  Daily notes:  During today's encounter, pt is guarded, appears frightened & presents with paranoia; states that she is afraid that someone is out to harm her, states she is unsure of who it is. She verbalizes not feeling ready for discharge, reports still feeling very depressed, rates depression 10, with 10 being worst. Rates anxiety 8, 10 being worst. Reports energy level as being low, concentration poor, states that she has a home, but does not feel safe going back there. Feels like someone is going to harm her if she goes back home. Presents with a very depressed moon, affect is congruent and flat. She reports a poor appetite, states that sleep is fair, denies being in any physical pain, denies issues with bowel movements, states that the last one was earlier today. Denies medication related side effects. Denies SI/HI/AVH. Denies first rank symptoms.  Discussed the need to add an antidepressant type medication to pt's medication regimen to help with the depressive symptoms, to which she is agreeable. Prozac  10 mg added to medication regimen for treatment of depressive  symptoms.    We discussed discharge, to which patient states she has a home, but this is highly doubtful, as she tends to be homeless, as evidenced by her past hospitalizations.  We will continue hospitalization while we are monitoring her response to medications, and we will also coordinate with CSW regarding discharge planning.  Patient might be a good candidate for an LAI type medication due to a history of noncompliance, but we will revisit this with her tomorrow.  Labs Reviewed: Ordered TSH, Vitamin D . EKG WNL.  Principal Problem: Disorganized schizophrenia, chronic condition (HCC)  Diagnosis: Principal Problem:   Disorganized schizophrenia, chronic condition (HCC)  Total Time spent with patient:  35 minutes  Past Psychiatric History: See H&P.  Past Medical History:  Past Medical History:  Diagnosis Date   Hypothyroidism    Polysubstance abuse (HCC)    Schizophrenia (HCC)    History reviewed. No pertinent surgical history.  Family History: History reviewed. No pertinent family history.  Family Psychiatric  History: See H&P.  Social History:  Social History   Substance and Sexual Activity  Alcohol Use Not Currently   Comment: denies     Social History   Substance and Sexual Activity  Drug Use Not Currently   Types: Cocaine, Amphetamines, Methamphetamines, Marijuana   Comment: denies    Social History   Socioeconomic History   Marital status: Single    Spouse name: Not on file   Number of children: Not on file   Years of education: Not on file   Highest education level: Not on file  Occupational History   Not on file  Tobacco Use  Smoking status: Some Days    Current packs/day: 1.00    Types: Cigarettes, E-cigarettes   Smokeless tobacco: Never  Vaping Use   Vaping status: Never Used  Substance and Sexual Activity   Alcohol use: Not Currently    Comment: denies   Drug use: Not Currently    Types: Cocaine, Amphetamines, Methamphetamines, Marijuana     Comment: denies   Sexual activity: Not Currently  Other Topics Concern   Not on file  Social History Narrative   Not on file   Social Drivers of Health   Financial Resource Strain: Low Risk  (11/20/2022)   Received from [facility]   Overall Financial Resource Strain (CARDIA)    Difficulty of Paying Living Expenses: Not very hard  Food Insecurity: Patient Unable To Answer (11/07/2023)   Hunger Vital Sign    Worried About Running Out of Food in the Last Year: Patient unable to answer    Ran Out of Food in the Last Year: Patient unable to answer  Transportation Needs: Patient Unable To Answer (11/07/2023)   PRAPARE - Transportation    Lack of Transportation (Medical): Patient unable to answer    Lack of Transportation (Non-Medical): Patient unable to answer  Recent Concern: Transportation Needs - Unmet Transportation Needs (11/06/2023)   PRAPARE - Transportation    Lack of Transportation (Medical): Yes    Lack of Transportation (Non-Medical): Yes  Physical Activity: Not on file  Stress: No Stress Concern Present (11/20/2022)   Received from [facility] - Occupational Stress Questionnaire    Feeling of Stress : Only a little  Social Connections: Unknown (03/09/2022)   Received from [facility]   Social Network    Social Network: Not on file   Additional Social History:   Sleep: Good  Appetite:  Good  Current Medications: Current Facility-Administered Medications  Medication Dose Route Frequency Provider Last Rate Last Admin   acetaminophen  (TYLENOL ) tablet 650 mg  650 mg Oral Q6H PRN Starkes-Perry, Takia S, FNP       alum & mag hydroxide-simeth (MAALOX/MYLANTA) 200-200-20 MG/5ML suspension 30 mL  30 mL Oral Q4H PRN Starkes-Perry, Takia S, FNP       haloperidol  (HALDOL ) tablet 5 mg  5 mg Oral TID PRN Johny Lot, MD   5 mg at 11/08/23 1426   And   diphenhydrAMINE  (BENADRYL ) capsule 50 mg  50 mg Oral TID PRN  Johny Lot, MD   50 mg at 11/08/23 1426   haloperidol  lactate (HALDOL ) injection 5 mg  5 mg Intramuscular TID PRN Massengill, Lot, MD       And   diphenhydrAMINE  (BENADRYL ) injection 50 mg  50 mg Intramuscular TID PRN Massengill, Lot, MD       And   LORazepam  (ATIVAN ) injection 2 mg  2 mg Intramuscular TID PRN Massengill, Lot, MD       haloperidol  lactate (HALDOL ) injection 10 mg  10 mg Intramuscular TID PRN Massengill, Lot, MD       And   diphenhydrAMINE  (BENADRYL ) injection 50 mg  50 mg Intramuscular TID PRN Massengill, Lot, MD       And   LORazepam  (ATIVAN ) injection 2 mg  2 mg Intramuscular TID PRN Massengill, Lot, MD       FLUoxetine  (PROZAC ) capsule 10 mg  10 mg Oral Daily Daphnee Preiss, NP   10 mg at 11/14/23 1426   gabapentin  (NEURONTIN ) capsule 300 mg  300 mg Oral QHS Starkes-Perry,  Majel RAMAN, FNP   300 mg at 11/13/23 2115   hydrOXYzine  (ATARAX ) tablet 25 mg  25 mg Oral TID PRN Starkes-Perry, Takia S, FNP   25 mg at 11/14/23 9141   lisinopril  (ZESTRIL ) tablet 5 mg  5 mg Oral Daily Nwoko, Agnes I, NP   5 mg at 11/14/23 9262   magnesium  hydroxide (MILK OF MAGNESIA) suspension 30 mL  30 mL Oral Daily PRN Starkes-Perry, Takia S, FNP       melatonin tablet 5 mg  5 mg Oral QHS Starkes-Perry, Takia S, FNP   5 mg at 11/13/23 2115   nicotine  (NICODERM CQ  - dosed in mg/24 hours) patch 14 mg  14 mg Transdermal Daily Wilkie Majel RAMAN, FNP   14 mg at 11/14/23 9262   ondansetron  (ZOFRAN -ODT) disintegrating tablet 4 mg  4 mg Oral Q6H PRN Collene Gouge I, NP   4 mg at 11/09/23 8165   QUEtiapine  (SEROQUEL ) tablet 50 mg  50 mg Oral QHS PRN Pashayan, Alexander S, MD   50 mg at 11/13/23 2115   risperiDONE  (RISPERDAL ) tablet 3 mg  3 mg Oral QHS McQuilla, Jai B, MD   3 mg at 11/13/23 2115    Lab Results:  No results found for this or any previous visit (from the past 48 hours).  Blood Alcohol level:  Lab Results  Component Value Date   ETH <10 11/04/2023   ETH <10  10/27/2023   Metabolic Disorder Labs: Lab Results  Component Value Date   HGBA1C 5.1 11/10/2023   MPG 100 11/10/2023   MPG 88.19 04/18/2022   Lab Results  Component Value Date   PROLACTIN 189.0 (H) 04/16/2022   PROLACTIN 2.6 (L) 01/15/2022   Lab Results  Component Value Date   CHOL 213 (H) 11/10/2023   TRIG 90 11/10/2023   HDL 52 11/10/2023   CHOLHDL 4.1 11/10/2023   VLDL 18 11/10/2023   LDLCALC 143 (H) 11/10/2023   LDLCALC 133 (H) 01/15/2022   Physical Findings: AIMS:  , ,  ,  ,    CIWA:    COWS:     Musculoskeletal: Strength & Muscle Tone: within normal limits Gait & Station: normal Patient leans: N/A  Psychiatric Specialty Exam:  Presentation  General Appearance:  Fairly Groomed  Eye Contact: Fair  Speech: Clear and Coherent  Speech Volume: Normal  Handedness: Right   Mood and Affect  Mood: Anxious; Depressed  Affect: Congruent  Thought Process  Thought Processes: Coherent  Descriptions of Associations:Intact  Orientation:Full (Time, Place and Person)  Thought Content:Logical  History of Schizophrenia/Schizoaffective disorder:Yes  Duration of Psychotic Symptoms:Greater than six months  Hallucinations:Hallucinations: None  Ideas of Reference:Paranoia; Percusatory; Delusions  Suicidal Thoughts:Suicidal Thoughts: No  Homicidal Thoughts:Homicidal Thoughts: No  Sensorium  Memory: Immediate Fair  Judgment: Fair  Insight: Poor  Executive Functions  Concentration: Poor  Attention Span: Poor  Recall: Poor  Fund of Knowledge: Poor  Language: Fair  Psychomotor Activity  Psychomotor Activity: Psychomotor Activity: Decreased  Assets  Assets: Resilience  Sleep  Sleep: Sleep: Good Number of Hours of Sleep: 8.5  Physical Exam: Physical Exam Vitals and nursing note reviewed.  HENT:     Nose: Nose normal.  Pulmonary:     Effort: Pulmonary effort is normal.  Genitourinary:    Comments:  Deferred Musculoskeletal:        General: Normal range of motion.  Skin:    General: Skin is warm and dry.  Neurological:     General: No focal deficit  present.     Mental Status: She is alert and oriented to person, place, and time.    Review of Systems  Constitutional:  Negative for chills and fever.  Gastrointestinal:  Negative for abdominal pain, constipation, diarrhea and nausea.  Musculoskeletal:  Negative for joint pain and myalgias.  Neurological:  Negative for loss of consciousness and headaches.  Psychiatric/Behavioral:  Positive for substance abuse. Negative for hallucinations and memory loss. The patient is nervous/anxious. The patient does not have insomnia.   All other systems reviewed and are negative.  Blood pressure 122/85, pulse 88, temperature 98.1 F (36.7 C), temperature source Oral, resp. rate 16, height 5' 1 (1.549 m), weight 54.4 kg, last menstrual period 11/04/2023, SpO2 100%. Body mass index is 22.67 kg/m.  Treatment Plan Summary: Daily contact with patient to assess and evaluate symptoms and progress in treatment and Medication management.   Continue inpatient hospitalization.  Will continue today 11/14/2023 plan as below except where it is noted. Will increase risperdal , as it appears patient is having paranoia and this may be why she feels off. Patient also continues to ask questions about basic tasks, indicating possible continued thought blocking.   Schizophrenia: -Start Prozac  10 mg daily for depressive symptoms -Continue Risperdal   to 3 mg po Q HS for Psychosis -Continue Gabapentin  300 mg TID for anxiety -Continue Agitation Protocol: Haldol /Ativan /Benadryl .  -Continue Melatonin 5 mg po q hs for insomnia.  -Continue Seroquel  50 mg po Q hs prn for insomnia. -Continue Lisinopril  10 mg po once today for HTN (11-11-23).  -Continue Lisinopril  5 mg po daily for HTN (Start 11-12-23).   Nicotine  Dependence: -Continue Nicotine  Patch 14 mg daily    -Continue Melatonin 5 mg QHS for Insomnia -Change Seroquel  50 mg to QHS PRN for Insomnia -Continue PRN's: Tylenol , Maalox, Atarax , Milk of Magnesia, Trazodone   Total Time spent with patient:  I personally spent 45 minutes on the unit in direct patient care. The direct patient care time included face-to-face time with the patient, reviewing the patient's chart, communicating with other professionals, and coordinating care. Greater than 50% of this time was spent in counseling or coordinating care with the patient regarding goals of hospitalization, psycho-education, and discharge planning needs.  Donia Snell, NP 11/14/2023, 2:29 PM Patient ID: Barbara Gordon, female   DOB: 04-29-1991, 33 y.o.   MRN: [id number]

## 2023-11-15 ENCOUNTER — Telehealth (HOSPITAL_COMMUNITY): Payer: Self-pay | Admitting: Pharmacy Technician

## 2023-11-15 ENCOUNTER — Other Ambulatory Visit (HOSPITAL_COMMUNITY): Payer: Self-pay

## 2023-11-15 DIAGNOSIS — F201 Disorganized schizophrenia: Secondary | ICD-10-CM | POA: Diagnosis not present

## 2023-11-15 LAB — TSH: TSH: 0.703 u[IU]/mL (ref 0.350–4.500)

## 2023-11-15 LAB — FOLATE: Folate: 6.4 ng/mL (ref >5.9)

## 2023-11-15 LAB — VITAMIN D 25 HYDROXY (VIT D DEFICIENCY, FRACTURES): Vit D, 25-Hydroxy: 7.53 ng/mL — ABNORMAL LOW (ref 30–100)

## 2023-11-15 LAB — VITAMIN B12: Vitamin B-12: 161 pg/mL — ABNORMAL LOW (ref 180–914)

## 2023-11-15 MED ORDER — VITAMIN D (ERGOCALCIFEROL) 1.25 MG (50000 UNIT) PO CAPS
50000.0000 [IU] | ORAL_CAPSULE | ORAL | Status: DC
  Administered 2023-11-15: 50000 [IU] via ORAL
  Filled 2023-11-15 (×2): qty 1

## 2023-11-15 MED ORDER — FLUOXETINE HCL 20 MG PO CAPS
20.0000 mg | ORAL_CAPSULE | Freq: Every day | ORAL | Status: DC
  Administered 2023-11-16 – 2023-11-21 (×6): 20 mg via ORAL
  Filled 2023-11-15 (×9): qty 1

## 2023-11-15 MED ORDER — VITAMIN D3 25 MCG PO TABS
2000.0000 [IU] | ORAL_TABLET | Freq: Every day | ORAL | Status: DC
  Administered 2023-11-15: 2000 [IU] via ORAL
  Filled 2023-11-15 (×3): qty 2

## 2023-11-15 MED ORDER — RISPERIDONE 2 MG PO TABS
4.0000 mg | ORAL_TABLET | Freq: Every day | ORAL | Status: DC
  Administered 2023-11-15 – 2023-11-20 (×6): 4 mg via ORAL
  Filled 2023-11-15 (×9): qty 2

## 2023-11-15 NOTE — Group Note (Signed)
 LCSW Group Therapy Note   Group Date: 11/15/2023 Start Time: 1100 End Time: 1200   Type of Therapy and Topic:  Group Therapy   Participation:  did not attend   Topic:  Effective Communication Skills  Goal of the Session: The goal of today's group session was to improve communication skills among participants, helping them express themselves more clearly, listen actively, and better understand others. This will contribute to healthier relationships and more effective problem-solving within the group and in daily life.  Objectives of the Session: To enhance active listening skills by engaging participants in exercises that improve their ability to focus on and understand others' points of view. To practice the use of I statements to help participants express their thoughts and feelings without sounding accusatory or confrontational. To discuss the importance of body language and other non-verbal communication cues and how they impact conversations. To help participants recognize the role of emotions in communication and develop strategies for managing emotions during conversations.  Summary:   In today's group session on communication, we began by discussing the importance of effective communication. We reviewed the different types of communication.  We then introduced the concept of active listening and "I" statements and practiced new communication skills using examples.  Participants learned how emotions can influence conversations and practiced techniques for managing strong emotions during discussions.  Participants were encouraged to reflect on one thing they would like to improve in their own communication and practice that in the coming week.  Therapeutic Modalities:  Elements of DBT   Barbara Gordon, LCSWA 11/15/2023  12:32 PM

## 2023-11-15 NOTE — Progress Notes (Signed)
   11/14/23 2155  Psych Admission Type (Psych Patients Only)  Admission Status Involuntary  Psychosocial Assessment  Patient Complaints Anxiety;Depression  Eye Contact Fair  Facial Expression Anxious  Affect Anxious  Speech Logical/coherent  Interaction Cautious  Motor Activity Other (Comment) (WDL)  Appearance/Hygiene Unremarkable  Behavior Characteristics Cooperative;Appropriate to situation;Anxious  Mood Anxious;Depressed  Thought Process  Coherency WDL  Content Preoccupation  Delusions None reported or observed  Perception WDL  Hallucination None reported or observed  Judgment Poor  Confusion None  Danger to Self  Current suicidal ideation? Denies  Description of Agreement verbal  Danger to Others  Danger to Others None reported or observed

## 2023-11-15 NOTE — Progress Notes (Signed)
 [facility] MD Progress Note  11/15/2023 3:25 PM Barbara Gordon  MRN:  [id number]  Reason for admission: 33 yr old female who presented on 12/27 to [facility] after a Suicide Attempt (OD- Seroquel , Gabapentin ) and bizarre behavior, she was admitted to [facility] on 12/31.  PPHx is significant for Schizophrenia, MDD, Borderline Personality Disorder, and a history of Polysubstance Abuse (Amphetamines, Opioids, THC), and Multiple Prior Suicide Attempts (OD) and Multiple Prior Psychiatric Hospitalizations (last- [facility] 11/2022).   24 hr chart review: Vitals within normal limits, patient slept 7.5 hours as per nursing documentation, and remains compliant with medications. Required Hydroxyzine  & Seroquel  as PRNs in the past 24 hrs. Has not attended any unit group sessions in the past 24 hrs, and is staying isolative to her room.  Today's patient notes:  During today's encounter, patient is guarded, presents with paranoia, repeatedly stating something is off, through entire encounter.  Patient seems frightened, appears with a very depressed mood, affect is congruent.  She is suspicious, but denies SI/HI/AVH.  She was able to provide consent today for CSW as well as this clinical research associate to speak with her friend with whom she resides [place]).  Please see note from CSW.  Patient rates her depression today a 10, 10 being worse.  Rates anxiety an 8, 10 being worse.  She was started on an antidepressant, Prozac  yesterday, we will increase dose to 20 mg starting tomorrow 1/8.  Also increasing the dose of her Risperdal  to 4 mg nightly to manage her psychosis.  Looking into an LAI type medication as patient will be a good candidate due to her history of medication noncompliance.  She reports a good appetite, staff is observing her eating, she denies being in physical pain, denies any issues with sleep.  Denies any issues with bowel movements.  Patient remains psychotic, we are continuing to titrate medications upwards to better target her  psychosis, and she is continuing to require inpatient hospitalization.   Labs Reviewed: Ordered TSH, Vitamin D , and B12 yesterday, vitamin D  very low at 7, we will supplement with 50,000 units weekly.  TSH WNL.  B12 is pending.  Principal Problem: Disorganized schizophrenia, chronic condition (HCC)  Diagnosis: Principal Problem:   Disorganized schizophrenia, chronic condition (HCC)  Past Psychiatric History: See H&P.  Past Medical History:  Past Medical History:  Diagnosis Date   Hypothyroidism    Polysubstance abuse (HCC)    Schizophrenia (HCC)    History reviewed. No pertinent surgical history.  Family History: History reviewed. No pertinent family history.  Family Psychiatric  History: See H&P.  Social History:  Social History   Substance and Sexual Activity  Alcohol Use Not Currently   Comment: denies     Social History   Substance and Sexual Activity  Drug Use Not Currently   Types: Cocaine, Amphetamines, Methamphetamines, Marijuana   Comment: denies    Social History   Socioeconomic History   Marital status: Single    Spouse name: Not on file   Number of children: Not on file   Years of education: Not on file   Highest education level: Not on file  Occupational History   Not on file  Tobacco Use   Smoking status: Some Days    Current packs/day: 1.00    Types: Cigarettes, E-cigarettes   Smokeless tobacco: Never  Vaping Use   Vaping status: Never Used  Substance and Sexual Activity   Alcohol use: Not Currently    Comment: denies   Drug use: Not Currently  Types: Cocaine, Amphetamines, Methamphetamines, Marijuana    Comment: denies   Sexual activity: Not Currently  Other Topics Concern   Not on file  Social History Narrative   Not on file   Social Drivers of Health   Financial Resource Strain: Low Risk  (11/20/2022)   Received from [facility]   Overall Financial Resource Strain (CARDIA)    Difficulty of Paying Living Expenses: Not very  hard  Food Insecurity: Patient Unable To Answer (11/07/2023)   Hunger Vital Sign    Worried About Running Out of Food in the Last Year: Patient unable to answer    Ran Out of Food in the Last Year: Patient unable to answer  Transportation Needs: Patient Unable To Answer (11/07/2023)   PRAPARE - Transportation    Lack of Transportation (Medical): Patient unable to answer    Lack of Transportation (Non-Medical): Patient unable to answer  Recent Concern: Transportation Needs - Unmet Transportation Needs (11/06/2023)   PRAPARE - Transportation    Lack of Transportation (Medical): Yes    Lack of Transportation (Non-Medical): Yes  Physical Activity: Not on file  Stress: No Stress Concern Present (11/20/2022)   Received from [facility] - Occupational Stress Questionnaire    Feeling of Stress : Only a little  Social Connections: Unknown (03/09/2022)   Received from [facility]   Social Network    Social Network: Not on file   Additional Social History:   Sleep: Good  Appetite:  Good  Current Medications: Current Facility-Administered Medications  Medication Dose Route Frequency Provider Last Rate Last Admin   acetaminophen  (TYLENOL ) tablet 650 mg  650 mg Oral Q6H PRN Starkes-Perry, Takia S, FNP       alum & mag hydroxide-simeth (MAALOX/MYLANTA) 200-200-20 MG/5ML suspension 30 mL  30 mL Oral Q4H PRN Starkes-Perry, Takia S, FNP       haloperidol  (HALDOL ) tablet 5 mg  5 mg Oral TID PRN Johny Lot, MD   5 mg at 11/08/23 1426   And   diphenhydrAMINE  (BENADRYL ) capsule 50 mg  50 mg Oral TID PRN Johny Lot, MD   50 mg at 11/08/23 1426   haloperidol  lactate (HALDOL ) injection 5 mg  5 mg Intramuscular TID PRN Massengill, Lot, MD       And   diphenhydrAMINE  (BENADRYL ) injection 50 mg  50 mg Intramuscular TID PRN Massengill, Lot, MD       And   LORazepam  (ATIVAN ) injection 2 mg  2 mg Intramuscular TID PRN  Massengill, Lot, MD       haloperidol  lactate (HALDOL ) injection 10 mg  10 mg Intramuscular TID PRN Massengill, Lot, MD       And   diphenhydrAMINE  (BENADRYL ) injection 50 mg  50 mg Intramuscular TID PRN Massengill, Lot, MD       And   LORazepam  (ATIVAN ) injection 2 mg  2 mg Intramuscular TID PRN Massengill, Lot, MD       FLUoxetine  (PROZAC ) capsule 10 mg  10 mg Oral Daily Ozie Dimaria, Donia, NP   10 mg at 11/15/23 0759   gabapentin  (NEURONTIN ) capsule 300 mg  300 mg Oral QHS Starkes-Perry, Takia S, FNP   300 mg at 11/14/23 2109   hydrOXYzine  (ATARAX ) tablet 25 mg  25 mg Oral TID PRN Starkes-Perry, Takia S, FNP   25 mg at 11/15/23 1026   lisinopril  (ZESTRIL ) tablet 5 mg  5 mg Oral Daily Nwoko, Agnes I, NP   5 mg at  11/15/23 0759   magnesium  hydroxide (MILK OF MAGNESIA) suspension 30 mL  30 mL Oral Daily PRN Starkes-Perry, Takia S, FNP       melatonin tablet 5 mg  5 mg Oral QHS Wilkie Majel RAMAN, FNP   5 mg at 11/14/23 2109   nicotine  (NICODERM CQ  - dosed in mg/24 hours) patch 14 mg  14 mg Transdermal Daily Starkes-Perry, Takia S, FNP   14 mg at 11/15/23 0800   ondansetron  (ZOFRAN -ODT) disintegrating tablet 4 mg  4 mg Oral Q6H PRN Collene Gouge I, NP   4 mg at 11/09/23 1834   QUEtiapine  (SEROQUEL ) tablet 50 mg  50 mg Oral QHS PRN Pashayan, Alexander S, MD   50 mg at 11/14/23 2111   risperiDONE  (RISPERDAL ) tablet 3 mg  3 mg Oral QHS McQuilla, Jai B, MD   3 mg at 11/14/23 2109   Vitamin D  (Ergocalciferol ) (DRISDOL ) 1.25 MG (50000 UNIT) capsule 50,000 Units  50,000 Units Oral Q7 days Parker, Alvin S, MD   50,000 Units at 11/15/23 1247   vitamin D3 (CHOLECALCIFEROL ) tablet 2,000 Units  2,000 Units Oral Daily Parker, Alvin S, MD   2,000 Units at 11/15/23 1247    Lab Results:  Results for orders placed or performed during the hospital encounter of 11/07/23 (from the past 48 hours)  TSH     Status: None   Collection Time: 11/15/23  6:23 AM  Result Value Ref Range   TSH 0.703 0.350 -  4.500 uIU/mL    Comment: Performed by a 3rd Generation assay with a functional sensitivity of <=0.01 uIU/mL. Performed at [facility], [address], [place], [place] [address]   VITAMIN D  25 Hydroxy (Vit-D Deficiency, Fractures)     Status: Abnormal   Collection Time: 11/15/23  6:23 AM  Result Value Ref Range   Vit D, 25-Hydroxy 7.53 (L) 30 - 100 ng/mL    Comment: (NOTE) Vitamin D  deficiency has been defined by the Institute of Medicine  and an Endocrine Society practice guideline as a level of serum 25-OH  vitamin D  less than 20 ng/mL (1,2). The Endocrine Society went on to  further define vitamin D  insufficiency as a level between 21 and 29  ng/mL (2).  1. IOM (Institute of Medicine). 2010. Dietary reference intakes for  calcium and D. [place]: [employer]. 2. Holick MF, Binkley [place], Bischoff-Ferrari HA, et al. Evaluation,  treatment, and prevention of vitamin D  deficiency: an Endocrine  Society clinical practice guideline, JCEM. 2011 Jul; 96(7): 1911-30.  Performed at [facility], [address], [place], [place] [address]     Blood Alcohol level:  Lab Results  Component Value Date   [facility] <10 11/04/2023   ETH <10 10/27/2023   Metabolic Disorder Labs: Lab Results  Component Value Date   HGBA1C 5.1 11/10/2023   MPG 100 11/10/2023   MPG 88.19 04/18/2022   Lab Results  Component Value Date   PROLACTIN 189.0 (H) 04/16/2022   PROLACTIN 2.6 (L) 01/15/2022   Lab Results  Component Value Date   CHOL 213 (H) 11/10/2023   TRIG 90 11/10/2023   HDL 52 11/10/2023   CHOLHDL 4.1 11/10/2023   VLDL 18 11/10/2023   LDLCALC 143 (H) 11/10/2023   LDLCALC 133 (H) 01/15/2022   Physical Findings: AIMS:0 CIWA:  na COWS:  n/a  Musculoskeletal: Strength & Muscle Tone: within normal limits Gait & Station: normal Patient leans: N/A  Psychiatric Specialty Exam:  Presentation  General  Appearance:  Disheveled  Eye  Contact: Minimal  Speech: Clear and Coherent  Speech Volume: Decreased  Handedness: Right   Mood and Affect  Mood: Depressed; Anxious  Affect: Congruent  Thought Process  Thought Processes: Coherent  Descriptions of Associations:Intact  Orientation:Partial  Thought Content:Illogical  History of Schizophrenia/Schizoaffective disorder:Yes  Duration of Psychotic Symptoms:Greater than six months  Hallucinations:Hallucinations: None  Ideas of Reference:Delusions; Paranoia; Percusatory  Suicidal Thoughts:Suicidal Thoughts: No  Homicidal Thoughts:Homicidal Thoughts: No  Sensorium  Memory: Immediate Fair  Judgment: Poor  Insight: Poor  Executive Functions  Concentration: Poor  Attention Span: Poor  Recall: Poor  Fund of Knowledge: Poor  Language: Fair  Psychomotor Activity  Psychomotor Activity: Psychomotor Activity: Normal   Assets  Assets: Resilience  Sleep  Sleep: Sleep: Good  Physical Exam: Physical Exam Vitals and nursing note reviewed.  HENT:     Nose: Nose normal.  Pulmonary:     Effort: Pulmonary effort is normal.  Genitourinary:    Comments: Deferred Musculoskeletal:        General: Normal range of motion.  Skin:    General: Skin is warm and dry.  Neurological:     General: No focal deficit present.     Mental Status: She is alert and oriented to person, place, and time.    Review of Systems  Constitutional:  Negative for chills and fever.  Gastrointestinal:  Negative for abdominal pain, constipation, diarrhea and nausea.  Musculoskeletal:  Negative for joint pain and myalgias.  Neurological:  Negative for loss of consciousness and headaches.  Psychiatric/Behavioral:  Positive for substance abuse. Negative for hallucinations and memory loss. The patient is nervous/anxious. The patient does not have insomnia.   All other systems reviewed and are negative.  Blood pressure 125/86, pulse 86, temperature 97.7 F  (36.5 C), temperature source Oral, resp. rate 12, height 5' 1 (1.549 m), weight 54.4 kg, last menstrual period 11/04/2023, SpO2 100%. Body mass index is 22.67 kg/m.  Treatment Plan Summary: Daily contact with patient to assess and evaluate symptoms and progress in treatment and Medication management.   Continue inpatient hospitalization.  Will continue today 11/15/2023 plan as below except where it is noted. Will increase risperdal , as it appears patient is having paranoia and this may be why she feels off. Patient also continues to ask questions about basic tasks, indicating possible continued thought blocking.   Schizophrenia: -Increase Prozac  from 10 mg to 20 mg daily for depressive symptoms on 1/8 -Give Invega  Sustenna 234mg  on 1/7 if insurance with pay for it -Increase Risperdal  from 3 to 4 mg po Q HS for Psychosis -Start Vit D 50.000 units weekly for low Vit D levels  -Continue Gabapentin  300 mg TID for anxiety -Continue Agitation Protocol: Haldol /Ativan /Benadryl .  -Continue Melatonin 5 mg po q hs for insomnia.  -Continue Seroquel  50 mg po Q hs prn for insomnia. -Continue Lisinopril  10 mg po once today for HTN (11-11-23).  -Continue Lisinopril  5 mg po daily for HTN (Start 11-12-23).   Nicotine  Dependence: -Continue Nicotine  Patch 14 mg daily   -Continue Melatonin 5 mg QHS for Insomnia -Change Seroquel  50 mg to QHS PRN for Insomnia -Continue PRN's: Tylenol , Maalox, Atarax , Milk of Magnesia, Trazodone   Total Time spent with patient:  I personally spent 45 minutes on the unit in direct patient care. The direct patient care time included face-to-face time with the patient, reviewing the patient's chart, communicating with other professionals, and coordinating care. Greater than 50% of this time was spent in counseling  or coordinating care with the patient regarding goals of hospitalization, psycho-education, and discharge planning needs.  Donia Snell, NP 11/15/2023, 3:25  PM Patient ID: Barbara Gordon, female   DOB: 1991/10/07, 33 y.o.   MRN: [id number]  Patient ID: Barbara Gordon, female   DOB: 04/08/1991, 33 y.o.   MRN: [id number]

## 2023-11-15 NOTE — [facility] Group Notes (Signed)
 Adult Psychoeducational Group Note  Date:  11/15/2023 Time:  9:25 PM  Group Topic/Focus:  Wrap-Up Group:   The focus of this group is to help patients review their daily goal of treatment and discuss progress on daily workbooks.  Participation Level:  Active  Participation Quality:  Attentive  Affect:  Appropriate  Cognitive:  Alert  Insight: Appropriate  Engagement in Group:  Engaged  Modes of Intervention:  Discussion  Additional Comments:  Patient attended and participated in the Wrap-up group.  Barbara Gordon 11/15/2023, 9:25 PM

## 2023-11-15 NOTE — Group Note (Signed)
 Date:  11/15/2023 Time:  10:14 AM  Group Topic/Focus:  Goals Group:   The focus of this group is to help patients establish daily goals to achieve during treatment and discuss how the patient can incorporate goal setting into their daily lives to aide in recovery. Orientation:   The focus of this group is to educate the patient on the purpose and policies of crisis stabilization and provide a format to answer questions about their admission.  The group details unit policies and expectations of patients while admitted.    Participation Level:  Did Not Attend    Cruz JONETTA Mars 11/15/2023, 10:14 AM

## 2023-11-15 NOTE — Telephone Encounter (Signed)
 Pharmacy Patient Advocate Encounter   Received notification  that prior authorization for Invega  Sustenna 156MG /ML syringes is required/requested.   Insurance verification completed.   The patient is insured through [employer] [place] .   Per test claim: PA required; PA started via CoverMyMeds. KEY [id number] . Waiting for clinical questions to populate.

## 2023-11-15 NOTE — B[facility] Suicide Risk Assessment (Signed)
 [facility] INPATIENT:  Family/Significant Other Suicide Prevention Education  Suicide Prevention Education:  Education Completed; Magdich,[name] (Friend) [phone] ,  (name of family member/significant other) has been identified by the patient as the family member/significant other with whom the patient will be residing, and identified as the person(s) who will aid the patient in the event of a mental health crisis (suicidal ideations/suicide attempt).  With written consent from the patient, the family member/significant other has been provided the following suicide prevention education, prior to the and/or following the discharge of the patient.  Spoke with [name] who reports that patient does live with her sort of. [name] has known her for 2 years and feels she is getting progressively more and more paranoid. He reports him and his mom have a hard time making sure she eats and drinks because she believes the food is poisoned. [name] also reports that she is on probation and likely will return back to jail soon due to continuous failed drug tests. There are no guns or weapons in the home.   The suicide prevention education provided includes the following: Suicide risk factors Suicide prevention and interventions National Suicide Hotline telephone number [facility] assessment telephone number [facility] Emergency Assistance 911 [facility] and/or Residential Mobile Crisis Unit telephone number  Request made of family/significant other to: Remove weapons (e.g., guns, rifles, knives), all items previously/currently identified as safety concern.   Remove drugs/medications (over-the-counter, prescriptions, illicit drugs), all items previously/currently identified as a safety concern.  The family member/significant other verbalizes understanding of the suicide prevention education information provided.  The family member/significant other agrees to remove the items of safety concern  listed above.  Jenkins LULLA Primer 11/15/2023, 3:05 PM

## 2023-11-15 NOTE — Plan of Care (Signed)
   Problem: Education: Goal: Emotional status will improve Outcome: Progressing Goal: Mental status will improve Outcome: Progressing Goal: Verbalization of understanding the information provided will improve Outcome: Progressing   Problem: Activity: Goal: Interest or engagement in activities will improve Outcome: Progressing

## 2023-11-15 NOTE — Progress Notes (Addendum)
 Pt denied SI/HI/AVH this morning. Pt presents with an anxious/worried affect. PRN Hydroxyzine  given per [facility] for anxiety. Pt has been cooperative throughout the shift. Pt given scheduled medications as prescribed. Q15 min checks verified for safety. Patient verbally contracts for safety. Patient compliant with medications and treatment plan. Patient is interacting well on the unit. Pt is safe on the unit.   11/15/23 0945  Psych Admission Type (Psych Patients Only)  Admission Status Involuntary  Psychosocial Assessment  Patient Complaints Anxiety;Depression  Eye Contact Fair  Facial Expression Anxious  Affect Anxious;Depressed  Speech Soft  Interaction Cautious;Guarded  Motor Activity Slow  Appearance/Hygiene Unremarkable  Behavior Characteristics Cooperative;Anxious  Mood Anxious;Depressed  Thought Process  Coherency WDL  Content WDL  Delusions None reported or observed  Perception WDL  Hallucination None reported or observed  Judgment Impaired  Confusion None  Danger to Self  Current suicidal ideation? Denies  Description of Suicide Plan No plan  Agreement Not to Harm Self Yes  Description of Agreement Pt verbally contracts for safety  Danger to Others  Danger to Others None reported or observed

## 2023-11-15 NOTE — B[facility] Group Notes (Signed)

## 2023-11-15 NOTE — Telephone Encounter (Signed)
 Patient Product/process Development Scientist completed.    The patient is insured through [employer] [place].     Ran test claim for Invega  Sustenna 156 mg/ml and Requires Prior Authorization  Ran test claim for Uzedy  75 mg/0.21 ml and Requires Prior Authorization  This test claim was processed through [facility]- copay amounts may vary at other pharmacies due to [employer], or as the patient moves through the different stages of their insurance plan.     Reyes Sharps, CPHT Pharmacy Technician III Certified Patient Advocate [facility] Pharmacy Patient Advocate Team Direct Number: [phone]  Fax: [phone]

## 2023-11-16 ENCOUNTER — Other Ambulatory Visit (HOSPITAL_COMMUNITY): Payer: Self-pay

## 2023-11-16 DIAGNOSIS — F201 Disorganized schizophrenia: Secondary | ICD-10-CM | POA: Diagnosis not present

## 2023-11-16 MED ORDER — PALIPERIDONE PALMITATE ER 234 MG/1.5ML IM SUSY
234.0000 mg | PREFILLED_SYRINGE | Freq: Once | INTRAMUSCULAR | Status: AC
  Administered 2023-11-16: 234 mg via INTRAMUSCULAR

## 2023-11-16 MED ORDER — VITAMIN B-12 1000 MCG PO TABS
1000.0000 ug | ORAL_TABLET | Freq: Every day | ORAL | Status: DC
  Administered 2023-11-16 – 2023-11-21 (×6): 1000 ug via ORAL
  Filled 2023-11-16 (×9): qty 1

## 2023-11-16 MED ORDER — VITAMIN D3 25 MCG PO TABS
2000.0000 [IU] | ORAL_TABLET | Freq: Every day | ORAL | Status: DC
Start: 2023-11-16 — End: 2023-11-21
  Administered 2023-11-16 – 2023-11-21 (×6): 2000 [IU] via ORAL
  Filled 2023-11-16 (×9): qty 2

## 2023-11-16 MED ORDER — PALIPERIDONE PALMITATE ER 156 MG/ML IM SUSY
156.0000 mg | PREFILLED_SYRINGE | INTRAMUSCULAR | Status: DC
  Administered 2023-11-20: 156 mg via INTRAMUSCULAR

## 2023-11-16 NOTE — Group Note (Signed)
 Recreation Therapy Group Note   Group Topic:Stress Management  Group Date: 11/16/2023 Start Time: 0935 End Time: 1005 Facilitators: Jireh Elmore-McCall, LRT,CTRS Location: 300 Hall Dayroom   Group Topic: Stress Management   Goal Area(s) Addresses:  Patient will actively participate in stress management techniques presented during session.  Patient will successfully identify benefit of practicing stress management post d/c.   Intervention: Relaxation exercise with ambient sound and script   Group Description: Guided Imagery. LRT provided education, instruction, and demonstration on practice of visualization via guided imagery. Patient was asked to participate in the technique introduced during session. LRT debriefed including topics of mindfulness, stress management and specific scenarios each patient could use these techniques. Patients were given suggestions of ways to access scripts post d/c and encouraged to explore Youtube and other apps available on smartphones, tablets, and computers.  Education:  Stress Management, Discharge Planning.    Affect/Mood: N/A   Participation Level: Did not attend    Clinical Observations/Individualized Feedback:      Plan: Continue to engage patient in RT group sessions 2-3x/week.   Daiana Vitiello-McCall, LRT,CTRS  11/16/2023 12:29 PM

## 2023-11-16 NOTE — Progress Notes (Signed)
     11/16/2023       12:18 PM   Barbara Gordon   Type of Note: Discussion about ACTT  Spoke with patient regarding ACTT services and if she would be interested at discharge. Pt stated I don't know, I don't think so. Pt did not seem to understand what the services involve however states that she does. Pt seemed confused. When asked if her roommate was in the room, she said I don't know, there is someone in the room but I don't know who it is. Earlier this morning, pt also did not recall that her name was [name], stating I think I am [name] I don't really know. MD aware.  Signed:  Oneita Allmon, LCSW-A 11/16/2023  12:18 PM

## 2023-11-16 NOTE — Group Note (Signed)
 Date:  11/16/2023 Time:  9:26 PM  Group Topic/Focus:  Wrap-Up Group:   The focus of this group is to help patients review their daily goal of treatment and discuss progress on daily workbooks.    Participation Level:  Did Not Attend  Participation Quality:   n/a  Affect:   n/a  Cognitive:   n/a  Insight: None  Engagement in Group:   n/a  Modes of Intervention:   n/a  Additional Comments:  Patient did not attend wrap up group.   Eward Mace 11/16/2023, 9:26 PM

## 2023-11-16 NOTE — Progress Notes (Signed)
   11/15/23 2202  Psych Admission Type (Psych Patients Only)  Admission Status Involuntary  Psychosocial Assessment  Patient Complaints None  Eye Contact Fair  Facial Expression Anxious  Affect Anxious;Depressed  Speech Soft  Interaction Cautious  Motor Activity Slow  Appearance/Hygiene Unremarkable  Behavior Characteristics Cooperative;Anxious  Mood Depressed;Anxious  Thought Process  Coherency WDL  Content WDL  Delusions None reported or observed  Perception WDL  Hallucination None reported or observed  Judgment Impaired  Confusion None  Danger to Self  Current suicidal ideation? Denies  Agreement Not to Harm Self Yes  Description of Agreement verbal  Danger to Others  Danger to Others None reported or observed

## 2023-11-16 NOTE — Progress Notes (Signed)
 [facility] MD Progress Note  11/16/2023 3:05 PM Barbara Gordon  MRN:  [id number]  Reason for admission: 33 yr old female who presented on 12/27 to [facility] after a Suicide Attempt (OD- Seroquel , Gabapentin ) and bizarre behavior, she was admitted to [facility] on 12/31.  PPHx is significant for Schizophrenia, MDD, Borderline Personality Disorder, and a history of Polysubstance Abuse (Amphetamines, Opioids, THC), and Multiple Prior Suicide Attempts (OD) and Multiple Prior Psychiatric Hospitalizations (last- [facility] 11/2022).   24 hr chart review: Vitals within normal limits, patient slept 8 hours as per nursing reports. She remains compliant with medications. Has not attended any unit group sessions in the past 24 hrs, and is staying isolative to her room.  Daily notes: Corrissa is seem. Chart reviewed. Patient discussed with the treatment team during team meeting this morning. She remains alert, oriented to self, however, patient seems aloof & presents as a a poor historian about her mental illness/life situation. She remains compliant with her recommended treatment regimen. She has agreed to receive the Invega  sustenna 234 mg today. The Invega  sustenna 156 mg will be administered in 4 days from today, then will continue Q 28 days. This will benefit patient after discharge to maintain compliance to recommended treatment regimen to achieve sustainable mood control. However, she denies to be referred to an Act team which could have benefited her on the long run. She currently denies any SIHI, AVH, delusional thoughts or paranoia. See the current plan of care below. Vital signs remain stable.  Labs Reviewed: Ordered TSH, Vitamin D , and B12 yesterday, vitamin D  very low at 7, we will supplement with 50,000 units weekly.  TSH WNL.  B12 is pending.  Principal Problem: Disorganized schizophrenia, chronic condition (HCC)  Diagnosis: Principal Problem:   Disorganized schizophrenia, chronic condition (HCC)  Past Psychiatric History:  See H&P.  Past Medical History:  Past Medical History:  Diagnosis Date   Hypothyroidism    Polysubstance abuse (HCC)    Schizophrenia (HCC)    History reviewed. No pertinent surgical history.  Family History: History reviewed. No pertinent family history.  Family Psychiatric  History: See H&P.  Social History:  Social History   Substance and Sexual Activity  Alcohol Use Not Currently   Comment: denies     Social History   Substance and Sexual Activity  Drug Use Not Currently   Types: Cocaine, Amphetamines, Methamphetamines, Marijuana   Comment: denies    Social History   Socioeconomic History   Marital status: Single    Spouse name: Not on file   Number of children: Not on file   Years of education: Not on file   Highest education level: Not on file  Occupational History   Not on file  Tobacco Use   Smoking status: Some Days    Current packs/day: 1.00    Types: Cigarettes, E-cigarettes   Smokeless tobacco: Never  Vaping Use   Vaping status: Never Used  Substance and Sexual Activity   Alcohol use: Not Currently    Comment: denies   Drug use: Not Currently    Types: Cocaine, Amphetamines, Methamphetamines, Marijuana    Comment: denies   Sexual activity: Not Currently  Other Topics Concern   Not on file  Social History Narrative   Not on file   Social Drivers of Health   Financial Resource Strain: Low Risk  (11/20/2022)   Received from [facility]   Overall Financial Resource Strain (CARDIA)    Difficulty of Paying Living Expenses: Not very hard  Food Insecurity: Patient Unable To Answer (11/07/2023)   Hunger Vital Sign    Worried About Running Out of Food in the Last Year: Patient unable to answer    Ran Out of Food in the Last Year: Patient unable to answer  Transportation Needs: Patient Unable To Answer (11/07/2023)   PRAPARE - Transportation    Lack of Transportation (Medical): Patient unable to answer    Lack of Transportation (Non-Medical):  Patient unable to answer  Recent Concern: Transportation Needs - Unmet Transportation Needs (11/06/2023)   PRAPARE - Transportation    Lack of Transportation (Medical): Yes    Lack of Transportation (Non-Medical): Yes  Physical Activity: Not on file  Stress: No Stress Concern Present (11/20/2022)   Received from [facility] - Occupational Stress Questionnaire    Feeling of Stress : Only a little  Social Connections: Unknown (03/09/2022)   Received from [facility]   Social Network    Social Network: Not on file   Additional Social History:   Sleep: Good  Appetite:  Good  Current Medications: Current Facility-Administered Medications  Medication Dose Route Frequency Provider Last Rate Last Admin   acetaminophen  (TYLENOL ) tablet 650 mg  650 mg Oral Q6H PRN Starkes-Perry, Takia S, FNP       alum & mag hydroxide-simeth (MAALOX/MYLANTA) 200-200-20 MG/5ML suspension 30 mL  30 mL Oral Q4H PRN Starkes-Perry, Takia S, FNP       haloperidol  (HALDOL ) tablet 5 mg  5 mg Oral TID PRN Johny Lot, MD   5 mg at 11/08/23 1426   And   diphenhydrAMINE  (BENADRYL ) capsule 50 mg  50 mg Oral TID PRN Johny Lot, MD   50 mg at 11/08/23 1426   haloperidol  lactate (HALDOL ) injection 5 mg  5 mg Intramuscular TID PRN Massengill, Lot, MD       And   diphenhydrAMINE  (BENADRYL ) injection 50 mg  50 mg Intramuscular TID PRN Massengill, Lot, MD       And   LORazepam  (ATIVAN ) injection 2 mg  2 mg Intramuscular TID PRN Massengill, Lot, MD       haloperidol  lactate (HALDOL ) injection 10 mg  10 mg Intramuscular TID PRN Massengill, Lot, MD       And   diphenhydrAMINE  (BENADRYL ) injection 50 mg  50 mg Intramuscular TID PRN Massengill, Lot, MD       And   LORazepam  (ATIVAN ) injection 2 mg  2 mg Intramuscular TID PRN Massengill, Lot, MD       FLUoxetine  (PROZAC ) capsule 20 mg  20 mg Oral Daily Nkwenti, Doris, NP   20 mg at  11/16/23 0803   gabapentin  (NEURONTIN ) capsule 300 mg  300 mg Oral QHS Starkes-Perry, Takia S, FNP   300 mg at 11/15/23 2104   hydrOXYzine  (ATARAX ) tablet 25 mg  25 mg Oral TID PRN Starkes-Perry, Takia S, FNP   25 mg at 11/16/23 1112   lisinopril  (ZESTRIL ) tablet 5 mg  5 mg Oral Daily Marlissa Emerick I, NP   5 mg at 11/16/23 9197   magnesium  hydroxide (MILK OF MAGNESIA) suspension 30 mL  30 mL Oral Daily PRN Starkes-Perry, Takia S, FNP       melatonin tablet 5 mg  5 mg Oral QHS Starkes-Perry, Takia S, FNP   5 mg at 11/15/23 2104   nicotine  (NICODERM CQ  - dosed in mg/24 hours) patch 14 mg  14 mg Transdermal Daily Wilkie Majel RAMAN, FNP   14 mg  at 11/16/23 0803   ondansetron  (ZOFRAN -ODT) disintegrating tablet 4 mg  4 mg Oral Q6H PRN Collene Gouge I, NP   4 mg at 11/09/23 8165   QUEtiapine  (SEROQUEL ) tablet 50 mg  50 mg Oral QHS PRN Pashayan, Alexander S, MD   50 mg at 11/15/23 2104   risperiDONE  (RISPERDAL ) tablet 4 mg  4 mg Oral QHS Nkwenti, Doris, NP   4 mg at 11/15/23 2104   Vitamin D  (Ergocalciferol ) (DRISDOL ) 1.25 MG (50000 UNIT) capsule 50,000 Units  50,000 Units Oral Q7 days Parker, Alvin S, MD   50,000 Units at 11/15/23 1247    Lab Results:  Results for orders placed or performed during the hospital encounter of 11/07/23 (from the past 48 hours)  TSH     Status: None   Collection Time: 11/15/23  6:23 AM  Result Value Ref Range   TSH 0.703 0.350 - 4.500 uIU/mL    Comment: Performed by a 3rd Generation assay with a functional sensitivity of <=0.01 uIU/mL. Performed at [facility], [address], [place], [place] [address]   VITAMIN D  25 Hydroxy (Vit-D Deficiency, Fractures)     Status: Abnormal   Collection Time: 11/15/23  6:23 AM  Result Value Ref Range   Vit D, 25-Hydroxy 7.53 (L) 30 - 100 ng/mL    Comment: (NOTE) Vitamin D  deficiency has been defined by the Institute of Medicine  and an Endocrine Society practice guideline as a level of serum 25-OH  vitamin D   less than 20 ng/mL (1,2). The Endocrine Society went on to  further define vitamin D  insufficiency as a level between 21 and 29  ng/mL (2).  1. IOM (Institute of Medicine). 2010. Dietary reference intakes for  calcium and D. [place]: [employer]. 2. Holick MF, Binkley [place], Bischoff-Ferrari HA, et al. Evaluation,  treatment, and prevention of vitamin D  deficiency: an Endocrine  Society clinical practice guideline, JCEM. 2011 Jul; 96(7): 1911-30.  Performed at [facility], [address] [place], [place] [address]   Folate     Status: None   Collection Time: 11/15/23  6:21 PM  Result Value Ref Range   Folate 6.4 >5.9 ng/mL    Comment: Performed at [facility], [address] [place], [place] [address]  Vitamin B12     Status: Abnormal   Collection Time: 11/15/23  6:21 PM  Result Value Ref Range   Vitamin B-12 161 (L) 180 - 914 pg/mL    Comment: (NOTE) This assay is not validated for testing neonatal or myeloproliferative syndrome specimens for Vitamin B12 levels. Performed at [facility], [address], [place], [place] [address]     Blood Alcohol level:  Lab Results  Component Value Date   [facility] <10 11/04/2023   ETH <10 10/27/2023   Metabolic Disorder Labs: Lab Results  Component Value Date   HGBA1C 5.1 11/10/2023   MPG 100 11/10/2023   MPG 88.19 04/18/2022   Lab Results  Component Value Date   PROLACTIN 189.0 (H) 04/16/2022   PROLACTIN 2.6 (L) 01/15/2022   Lab Results  Component Value Date   CHOL 213 (H) 11/10/2023   TRIG 90 11/10/2023   HDL 52 11/10/2023   CHOLHDL 4.1 11/10/2023   VLDL 18 11/10/2023   LDLCALC 143 (H) 11/10/2023   LDLCALC 133 (H) 01/15/2022   Physical Findings: AIMS:0 CIWA:  na COWS:  n/a  Musculoskeletal: Strength & Muscle Tone: within normal limits Gait & Station:  normal Patient leans: N/A  Psychiatric Specialty Exam:  Presentation  General Appearance:   Disheveled  Eye Contact: Minimal  Speech: Clear and Coherent  Speech Volume: Decreased  Handedness: Right   Mood and Affect  Mood: Depressed; Anxious  Affect: Congruent  Thought Process  Thought Processes: Coherent  Descriptions of Associations:Intact  Orientation:Partial  Thought Content:Illogical  History of Schizophrenia/Schizoaffective disorder:Yes  Duration of Psychotic Symptoms:Greater than six months  Hallucinations:Hallucinations: None  Ideas of Reference:Delusions; Paranoia; Percusatory  Suicidal Thoughts:Suicidal Thoughts: No  Homicidal Thoughts:Homicidal Thoughts: No  Sensorium  Memory: Immediate Fair  Judgment: Poor  Insight: Poor  Executive Functions  Concentration: Poor  Attention Span: Poor  Recall: Poor  Fund of Knowledge: Poor  Language: Fair  Psychomotor Activity  Psychomotor Activity: Psychomotor Activity: Normal   Assets  Assets: Resilience  Sleep  Sleep: Sleep: Good  Physical Exam: Physical Exam Vitals and nursing note reviewed.  HENT:     Nose: Nose normal.  Pulmonary:     Effort: Pulmonary effort is normal.  Genitourinary:    Comments: Deferred Musculoskeletal:        General: Normal range of motion.  Skin:    General: Skin is warm and dry.  Neurological:     General: No focal deficit present.     Mental Status: She is alert and oriented to person, place, and time.    Review of Systems  Constitutional:  Negative for chills and fever.  Gastrointestinal:  Negative for abdominal pain, constipation, diarrhea and nausea.  Musculoskeletal:  Negative for joint pain and myalgias.  Neurological:  Negative for loss of consciousness and headaches.  Psychiatric/Behavioral:  Positive for substance abuse. Negative for hallucinations and memory loss. The patient is nervous/anxious. The patient does not have insomnia.   All other systems reviewed and are negative.  Blood pressure 106/76, pulse 96,  temperature 98 F (36.7 C), resp. rate 20, height 5' 1 (1.549 m), weight 54.4 kg, last menstrual period 11/04/2023, SpO2 98%. Body mass index is 22.67 kg/m.  Treatment Plan Summary: Daily contact with patient to assess and evaluate symptoms and progress in treatment and Medication management.   Continue inpatient hospitalization.  Will continue today 11/16/2023 plan as below except where it is noted. Will increase risperdal , as it appears patient is having paranoia and this may be why she feels off. Patient also continues to ask questions about basic tasks, indicating possible continued thought blocking.   Schizophrenia: -Continue Prozac  20 mg daily for depressive symptoms on 1/8 -Give Invega  Sustenna 234 mg IM today 11/16/23.  -Continue Invega  sustenna 156 mg IM on 11-20-23 & Q 28 days for mood control. -Continue 4 mg po Q HS for Psychosis -Continue Vit D 50.000 units weekly for low Vit D levels  -Continue Gabapentin  300 mg TID for anxiety -Continue Agitation Protocol: Haldol /Ativan /Benadryl .  -Continue Melatonin 5 mg po q hs for insomnia.  -Continue Seroquel  50 mg po Q hs prn for insomnia. -Completed Lisinopril  10 mg po once today for HTN (11-11-23).  -Continue Lisinopril  5 mg po daily for HTN (Start 11-12-23).   Nicotine  Dependence: -Continue Nicotine  Patch 14 mg daily   -Continue Melatonin 5 mg QHS for Insomnia -Change Seroquel  50 mg to QHS PRN for Insomnia -Continue PRN's: Tylenol , Maalox, Atarax , Milk of Magnesia, Trazodone   Total Time spent with patient:  I personally spent 45 minutes on the unit in direct patient care. The direct patient care time included face-to-face time with the patient, reviewing the patient's chart, communicating with other professionals,  and coordinating care. Greater than 50% of this time was spent in counseling or coordinating care with the patient regarding goals of hospitalization, psycho-education, and discharge planning needs.  Mac Bolster,  NP 11/16/2023, 3:05 PM Patient ID: Barbara Gordon, female   DOB: Mar 16, 1991, 33 y.o.   MRN: [id number]  Patient ID: Sarya Linenberger, female   DOB: Mar 16, 1991, 33 y.o.   MRN: [id number] Patient ID: Beva Remund, female   DOB: December 21, 1990, 33 y.o.   MRN: [id number]

## 2023-11-16 NOTE — Progress Notes (Signed)
 D: Patient is alert, oriented, and cooperative. Denies SI, HI, AVH, and verbally contracts for safety.    A: Scheduled medications administered per MD order. PRN hydroxyzine  administered. Support provided. Patient educated on safety on the unit and medications. Routine safety checks every 15 minutes. Patient stated understanding to tell nurse about any new physical symptoms. Patient understands to tell staff of any needs.     R: No adverse drug reactions noted. Patient verbally contracts for safety. Patient remains safe at this time and will continue to monitor.    11/16/23 1000  Psych Admission Type (Psych Patients Only)  Admission Status Involuntary  Psychosocial Assessment  Patient Complaints Other (Comment) (sleepiness)  Eye Contact Fair  Facial Expression Anxious  Affect Anxious;Depressed  Speech Soft  Interaction Cautious  Motor Activity Slow  Appearance/Hygiene Unremarkable  Behavior Characteristics Cooperative;Anxious  Mood Depressed;Anxious  Thought Process  Coherency WDL  Content WDL  Delusions None reported or observed  Perception WDL  Hallucination None reported or observed  Judgment Impaired  Confusion None  Danger to Self  Current suicidal ideation? Denies  Agreement Not to Harm Self Yes  Description of Agreement verbal  Danger to Others  Danger to Others None reported or observed

## 2023-11-16 NOTE — Telephone Encounter (Signed)
 Clinical Questions have been submitted

## 2023-11-16 NOTE — Plan of Care (Signed)

## 2023-11-16 NOTE — Telephone Encounter (Signed)
 Pharmacy Patient Advocate Encounter  Received notification from [facility] that Prior Authorization for Invega  Sustenna 156MG /ML syringes has been APPROVED from 11/16/2023 to 11/15/2024. Ran test claim, Copay is $4.00. This test claim was processed through [facility]- copay amounts may vary at other pharmacies due to pharmacy/plan contracts, or as the patient moves through the different stages of their insurance plan.   PA #/Case ID/Reference #: [id number]

## 2023-11-17 DIAGNOSIS — F201 Disorganized schizophrenia: Secondary | ICD-10-CM | POA: Diagnosis not present

## 2023-11-17 MED ORDER — WHITE PETROLATUM EX OINT
TOPICAL_OINTMENT | CUTANEOUS | Status: AC
  Administered 2023-11-17: 1
  Filled 2023-11-17: qty 5

## 2023-11-17 NOTE — Group Note (Signed)
 LCSW Group Therapy Note  Group Date: 11/17/2023 Start Time: 1100 End Time: 1200   Type of Therapy and Topic:  Group Therapy: Positive Affirmations/ vision boarda  Participation Level:  Did Not Attend   Description of Group:   This group addressed positive affirmation towards self and others.  Patients went around the room and identified two positive things about themselves and two positive things about a peer in the room.  Patients reflected on how it felt to share something positive with others, to identify positive things about themselves, and to hear positive things from others/ Patients were encouraged to have a daily reflection of positive characteristics or circumstances.   Therapeutic Goals: Patients will verbalize two of their positive qualities Patients will demonstrate empathy for others by stating two positive qualities about a peer in the group Patients will verbalize their feelings when voicing positive self affirmations and when voicing positive affirmations of others Patients will discuss the potential positive impact on their wellness/recovery of focusing on positive traits of self and others.  Summary of Patient Progress:  Pt was invited but did not attend  Therapeutic Modalities:   Cognitive Behavioral Therapy Motivational Interviewing    Golda Louder, LCSWA 11/17/2023  12:28 PM

## 2023-11-17 NOTE — Group Note (Signed)
 Date:  11/17/2023 Time:  2:16 PM  Group Topic/Focus:   Healthy Communication:  The focus of this group is to discuss what boundaries are, making the distinction between healthy and unhealthy boundaries, and exploring different types of boundaries like physical, emotional, intellectual, and time-based.    Participation Level:  Minimal  Participation Quality:  Appropriate  Affect:  Appropriate  Cognitive:  Appropriate  Insight: Appropriate  Engagement in Group:  Developing/Improving  Modes of Intervention:  Discussion and Education  Additional Comments:    Barbara Gordon 11/17/2023, 2:16 PM

## 2023-11-17 NOTE — B[facility] Group Notes (Signed)
 Adult Psychoeducational Group Note  Date:  11/17/2023 Time:  9:33 PM  Group Topic/Focus:  Wrap-Up Group:   The focus of this group is to help patients review their daily goal of treatment and discuss progress on daily workbooks.  Participation Level:  Active  Participation Quality:  Attentive  Affect:  Appropriate  Cognitive:  Alert  Insight: Appropriate  Engagement in Group:  Engaged  Modes of Intervention:  Discussion  Additional Comments:  Patient attended and participated in the Wrap-up group.  Barbara Gordon 11/17/2023, 9:33 PM

## 2023-11-17 NOTE — Progress Notes (Signed)
   11/17/23 1500  Psych Admission Type (Psych Patients Only)  Admission Status Involuntary  Psychosocial Assessment  Patient Complaints Anxiety  Eye Contact Fair  Facial Expression Anxious  Affect Anxious  Speech Soft  Interaction Cautious  Motor Activity Slow  Appearance/Hygiene Unremarkable  Behavior Characteristics Cooperative  Mood Anxious  Thought Process  Coherency WDL  Content WDL  Delusions None reported or observed  Perception WDL  Hallucination None reported or observed  Judgment Impaired  Confusion None  Danger to Self  Current suicidal ideation? Denies  Danger to Others  Danger to Others None reported or observed

## 2023-11-17 NOTE — Plan of Care (Signed)
  Problem: Education: Goal: Knowledge of [facility] Education information/materials will improve Outcome: Progressing Goal: Emotional status will improve Outcome: Progressing Goal: Mental status will improve Outcome: Progressing Goal: Verbalization of understanding the information provided will improve Outcome: Progressing   Problem: Activity: Goal: Interest or engagement in activities will improve Outcome: Progressing Goal: Sleeping patterns will improve Outcome: Progressing   Problem: Coping: Goal: Ability to verbalize frustrations and anger appropriately will improve Outcome: Progressing Goal: Ability to demonstrate self-control will improve Outcome: Progressing   Problem: Health Behavior/Discharge Planning: Goal: Identification of resources available to assist in meeting health care needs will improve Outcome: Progressing Goal: Compliance with treatment plan for underlying cause of condition will improve Outcome: Progressing   Problem: Physical Regulation: Goal: Ability to maintain clinical measurements within normal limits will improve Outcome: Progressing   Problem: Safety: Goal: Periods of time without injury will increase Outcome: Progressing   Problem: Education: Goal: Ability to make informed decisions regarding treatment will improve Outcome: Progressing   Problem: Coping: Goal: Coping ability will improve Outcome: Progressing   Problem: Health Behavior/Discharge Planning: Goal: Identification of resources available to assist in meeting health care needs will improve Outcome: Progressing   Problem: Medication: Goal: Compliance with prescribed medication regimen will improve Outcome: Progressing   Problem: Self-Concept: Goal: Ability to disclose and discuss suicidal ideas will improve Outcome: Progressing Goal: Will verbalize positive feelings about self Outcome: Progressing Note: Patient is on track. Patient will maintain adherence

## 2023-11-17 NOTE — Progress Notes (Signed)
[facility] MD Progress Note  11/17/2023 4:54 PM Barbara Gordon  MRN:  [id number]  Reason for admission: 33 yr old female who presented on 12/27 to [facility] after a Suicide Attempt (OD- Seroquel , Gabapentin ) and bizarre behavior, she was admitted to [facility] on 12/31.  PPHx is significant for Schizophrenia, MDD, Borderline Personality Disorder, and a history of Polysubstance Abuse (Amphetamines, Opioids, THC), and Multiple Prior Suicide Attempts (OD) and Multiple Prior Psychiatric Hospitalizations (last- [facility] 11/2022).   24 hr chart review: Vitals within normal limits, patient slept 8 hours as per nursing reports. She remains compliant with medications. Has not attended any unit group sessions in the past 24 hrs, and is staying isolative to her room.  Daily notes: Barbara Gordon is seen. Chart reviewed. Patient discussed with the treatment team during team meeting this morning. She remains alert, oriented to self, however, patient seems aloof & presents as a a poor historian about her mental illness/life situation. She remains compliant with her recommended treatment regimen. She has received the initial dose of her invega  sustenna. The 156 mg dose will be administered in 4 days from the day of the administration of the first dose, then will continue Q 28 days or on monthly basis. This will benefit patient after discharge to maintain compliance to recommended treatment regimen to achieve sustainable mood control. However, she denies to be referred to an Act team which could have benefited her on the long run. She currently denies any SIHI, AVH, delusional thoughts or paranoia. She denies any side effects. See the current plan of care below. Vital signs remain stable.  Labs Reviewed: Ordered TSH, Vitamin D , and B12 yesterday, vitamin D  very low at 7, we will supplement with 50,000 units weekly.  TSH WNL.  B12 is pending.  Principal Problem: Disorganized schizophrenia, chronic condition (HCC)  Diagnosis: Principal Problem:    Disorganized schizophrenia, chronic condition (HCC)  Past Psychiatric History: See H&P.  Past Medical History:  Past Medical History:  Diagnosis Date   Hypothyroidism    Polysubstance abuse (HCC)    Schizophrenia (HCC)    History reviewed. No pertinent surgical history.  Family History: History reviewed. No pertinent family history.  Family Psychiatric  History: See H&P.  Social History:  Social History   Substance and Sexual Activity  Alcohol Use Not Currently   Comment: denies     Social History   Substance and Sexual Activity  Drug Use Not Currently   Types: Cocaine, Amphetamines, Methamphetamines, Marijuana   Comment: denies    Social History   Socioeconomic History   Marital status: Single    Spouse name: Not on file   Number of children: Not on file   Years of education: Not on file   Highest education level: Not on file  Occupational History   Not on file  Tobacco Use   Smoking status: Some Days    Current packs/day: 1.00    Types: Cigarettes, E-cigarettes   Smokeless tobacco: Never  Vaping Use   Vaping status: Never Used  Substance and Sexual Activity   Alcohol use: Not Currently    Comment: denies   Drug use: Not Currently    Types: Cocaine, Amphetamines, Methamphetamines, Marijuana    Comment: denies   Sexual activity: Not Currently  Other Topics Concern   Not on file  Social History Narrative   Not on file   Social Drivers of Health   Financial Resource Strain: Low Risk  (11/20/2022)   Received from [facility]   Overall Financial  Resource Strain (CARDIA)    Difficulty of Paying Living Expenses: Not very hard  Food Insecurity: Patient Unable To Answer (11/07/2023)   Hunger Vital Sign    Worried About Running Out of Food in the Last Year: Patient unable to answer    Ran Out of Food in the Last Year: Patient unable to answer  Transportation Needs: Patient Unable To Answer (11/07/2023)   PRAPARE - Transportation    Lack of Transportation  (Medical): Patient unable to answer    Lack of Transportation (Non-Medical): Patient unable to answer  Recent Concern: Transportation Needs - Unmet Transportation Needs (11/06/2023)   PRAPARE - Transportation    Lack of Transportation (Medical): Yes    Lack of Transportation (Non-Medical): Yes  Physical Activity: Not on file  Stress: No Stress Concern Present (11/20/2022)   Received from [facility] - Occupational Stress Questionnaire    Feeling of Stress : Only a little  Social Connections: Unknown (03/09/2022)   Received from [facility]   Social Network    Social Network: Not on file   Additional Social History:   Sleep: Good  Appetite:  Good  Current Medications: Current Facility-Administered Medications  Medication Dose Route Frequency Provider Last Rate Last Admin   acetaminophen  (TYLENOL ) tablet 650 mg  650 mg Oral Q6H PRN Starkes-Perry, Takia S, FNP       alum & mag hydroxide-simeth (MAALOX/MYLANTA) 200-200-20 MG/5ML suspension 30 mL  30 mL Oral Q4H PRN Starkes-Perry, Takia S, FNP       cyanocobalamin  (VITAMIN B12) tablet 1,000 mcg  1,000 mcg Oral Daily Parker, Alvin S, MD   1,000 mcg at 11/17/23 0930   haloperidol  (HALDOL ) tablet 5 mg  5 mg Oral TID PRN Johny Lot, MD   5 mg at 11/08/23 1426   And   diphenhydrAMINE  (BENADRYL ) capsule 50 mg  50 mg Oral TID PRN Johny Lot, MD   50 mg at 11/08/23 1426   haloperidol  lactate (HALDOL ) injection 5 mg  5 mg Intramuscular TID PRN Johny Lot, MD       And   diphenhydrAMINE  (BENADRYL ) injection 50 mg  50 mg Intramuscular TID PRN Massengill, Lot, MD       And   LORazepam  (ATIVAN ) injection 2 mg  2 mg Intramuscular TID PRN Johny Lot, MD       haloperidol  lactate (HALDOL ) injection 10 mg  10 mg Intramuscular TID PRN Johny Lot, MD       And   diphenhydrAMINE  (BENADRYL ) injection 50 mg  50 mg Intramuscular TID PRN Massengill, Lot,  MD       And   LORazepam  (ATIVAN ) injection 2 mg  2 mg Intramuscular TID PRN Massengill, Lot, MD       FLUoxetine  (PROZAC ) capsule 20 mg  20 mg Oral Daily Nkwenti, Doris, NP   20 mg at 11/17/23 0930   gabapentin  (NEURONTIN ) capsule 300 mg  300 mg Oral QHS Starkes-Perry, Takia S, FNP   300 mg at 11/16/23 2107   hydrOXYzine  (ATARAX ) tablet 25 mg  25 mg Oral TID PRN Starkes-Perry, Takia S, FNP   25 mg at 11/16/23 1553   lisinopril  (ZESTRIL ) tablet 5 mg  5 mg Oral Daily Neave Lenger, Mac I, NP   5 mg at 11/17/23 0930   magnesium  hydroxide (MILK OF MAGNESIA) suspension 30 mL  30 mL Oral Daily PRN Starkes-Perry, Takia S, FNP       melatonin tablet 5 mg  5 mg  Oral QHS Wilkie Majel RAMAN, FNP   5 mg at 11/16/23 2107   nicotine  (NICODERM CQ  - dosed in mg/24 hours) patch 14 mg  14 mg Transdermal Daily Starkes-Perry, Takia S, FNP   14 mg at 11/17/23 0930   ondansetron  (ZOFRAN -ODT) disintegrating tablet 4 mg  4 mg Oral Q6H PRN Collene Gouge I, NP   4 mg at 11/09/23 1834   [START ON 11/20/2023] paliperidone  (INVEGA  SUSTENNA) injection 156 mg  156 mg Intramuscular Q28 days Collene Gouge I, NP       QUEtiapine  (SEROQUEL ) tablet 50 mg  50 mg Oral QHS PRN Pashayan, Alexander S, MD   50 mg at 11/16/23 2107   risperiDONE  (RISPERDAL ) tablet 4 mg  4 mg Oral QHS Nkwenti, Doris, NP   4 mg at 11/16/23 2107   Vitamin D  (Ergocalciferol ) (DRISDOL ) 1.25 MG (50000 UNIT) capsule 50,000 Units  50,000 Units Oral Q7 days Parker, Alvin S, MD   50,000 Units at 11/15/23 1247   vitamin D3 (CHOLECALCIFEROL ) tablet 2,000 Units  2,000 Units Oral Daily Parker, Alvin S, MD   2,000 Units at 11/17/23 0930   white petrolatum  (VASELINE) gel             Lab Results:  Results for orders placed or performed during the hospital encounter of 11/07/23 (from the past 48 hours)  Folate     Status: None   Collection Time: 11/15/23  6:21 PM  Result Value Ref Range   Folate 6.4 >5.9 ng/mL    Comment: Performed at [facility],  [address] [place], [place] [address]  Vitamin B12     Status: Abnormal   Collection Time: 11/15/23  6:21 PM  Result Value Ref Range   Vitamin B-12 161 (L) 180 - 914 pg/mL    Comment: (NOTE) This assay is not validated for testing neonatal or myeloproliferative syndrome specimens for Vitamin B12 levels. Performed at [facility], [address], [place], [place] [address]     Blood Alcohol level:  Lab Results  Component Value Date   [facility] <10 11/04/2023   ETH <10 10/27/2023   Metabolic Disorder Labs: Lab Results  Component Value Date   HGBA1C 5.1 11/10/2023   MPG 100 11/10/2023   MPG 88.19 04/18/2022   Lab Results  Component Value Date   PROLACTIN 189.0 (H) 04/16/2022   PROLACTIN 2.6 (L) 01/15/2022   Lab Results  Component Value Date   CHOL 213 (H) 11/10/2023   TRIG 90 11/10/2023   HDL 52 11/10/2023   CHOLHDL 4.1 11/10/2023   VLDL 18 11/10/2023   LDLCALC 143 (H) 11/10/2023   LDLCALC 133 (H) 01/15/2022   Physical Findings: AIMS:0 CIWA:  na COWS:  n/a  Musculoskeletal: Strength & Muscle Tone: within normal limits Gait & Station: normal Patient leans: N/A  Psychiatric Specialty Exam:  Presentation  General Appearance:  Casual; Fairly Groomed  Eye Contact: Good  Speech: Clear and Coherent; Normal Rate  Speech Volume: Normal  Handedness: Right   Mood and Affect  Mood: -- (improving)  Affect: Congruent  Thought Process  Thought Processes: Disorganized  Descriptions of Associations:Loose  Orientation:Partial  Thought Content:Scattered  History of Schizophrenia/Schizoaffective disorder:Yes  Duration of Psychotic Symptoms:Greater than six months  Hallucinations:Hallucinations: None   Ideas of Reference:None  Suicidal Thoughts:Suicidal Thoughts: No   Homicidal Thoughts:Homicidal Thoughts: No   Sensorium  Memory: Immediate Fair; Recent Fair; Remote Poor  Judgment: --  (Limited)  Insight: Lacking  Executive Functions  Concentration: Good  Attention Span: Good  Recall: Poor  Fund of Knowledge: Poor  Language: Fair  Psychomotor Activity  Psychomotor Activity: Psychomotor Activity: Normal    Assets  Assets: Desire for Improvement; Resilience; Physical Health; Social Support  Sleep  Sleep: Sleep: Good Number of Hours of Sleep: 8   Physical Exam: Physical Exam Vitals and nursing note reviewed.  HENT:     Nose: Nose normal.  Pulmonary:     Effort: Pulmonary effort is normal.  Genitourinary:    Comments: Deferred Musculoskeletal:        General: Normal range of motion.  Skin:    General: Skin is warm and dry.  Neurological:     General: No focal deficit present.     Mental Status: She is alert and oriented to person, place, and time.    Review of Systems  Constitutional:  Negative for chills and fever.  Gastrointestinal:  Negative for abdominal pain, constipation, diarrhea and nausea.  Musculoskeletal:  Negative for joint pain and myalgias.  Neurological:  Negative for loss of consciousness and headaches.  Psychiatric/Behavioral:  Positive for substance abuse. Negative for hallucinations and memory loss. The patient is nervous/anxious. The patient does not have insomnia.   All other systems reviewed and are negative.  Blood pressure 135/81, pulse 96, temperature 97.6 F (36.4 C), resp. rate 16, height 5' 1 (1.549 m), weight 54.4 kg, last menstrual period 11/04/2023, SpO2 98%. Body mass index is 22.67 kg/m.  Treatment Plan Summary: Daily contact with patient to assess and evaluate symptoms and progress in treatment and Medication management.   Continue inpatient hospitalization.  Will continue today 11/17/2023 plan as below except where it is noted. Will increase risperdal , as it appears patient is having paranoia and this may be why she feels off. Patient also continues to ask questions about basic tasks, indicating  possible continued thought blocking.   Schizophrenia: -Continue Prozac  20 mg daily for depressive symptoms on 1/8 -Give Invega  Sustenna 234 mg IM today 11/16/23 (Completed).  -Continue Invega  sustenna 156 mg IM on 11-20-23 & Q 28 days for mood control. -Continue 4 mg po Q HS for Psychosis -Continue Vit D 50.000 units weekly for low Vit D levels  -Continue Gabapentin  300 mg TID for anxiety -Continue Agitation Protocol: Haldol /Ativan /Benadryl .  -Continue Melatonin 5 mg po q hs for insomnia.  -Continue Seroquel  50 mg po Q hs prn for insomnia. -Completed Lisinopril  10 mg po once today for HTN (11-11-23).  -Continue Lisinopril  5 mg po daily for HTN (Start 11-12-23).   Nicotine  Dependence: -Continue Nicotine  Patch 14 mg daily   -Continue Melatonin 5 mg QHS for Insomnia -Change Seroquel  50 mg to QHS PRN for Insomnia -Continue PRN's: Tylenol , Maalox, Atarax , Milk of Magnesia, Trazodone   Total Time spent with patient:  I personally spent 45 minutes on the unit in direct patient care. The direct patient care time included face-to-face time with the patient, reviewing the patient's chart, communicating with other professionals, and coordinating care. Greater than 50% of this time was spent in counseling or coordinating care with the patient regarding goals of hospitalization, psycho-education, and discharge planning needs.  Mac Bolster, NP 11/17/2023, 4:54 PM Patient ID: Barbara Gordon, female   DOB: 1991/08/24, 33 y.o.   MRN: [id number]  Patient ID: Barbara Gordon, female   DOB: [birth year], 33 y.o.   MRN: [id number] Patient ID: Barbara Gordon, female   DOB: 1991-05-09, 33 y.o.   MRN: [id number] Patient ID: Barbara Gordon, female   DOB: [birth year], 33 y.o.   MRN:  [id number]  

## 2023-11-18 ENCOUNTER — Encounter (HOSPITAL_COMMUNITY): Payer: Self-pay

## 2023-11-18 DIAGNOSIS — F201 Disorganized schizophrenia: Secondary | ICD-10-CM | POA: Diagnosis not present

## 2023-11-18 NOTE — Progress Notes (Signed)
 [facility] MD Progress Note  11/18/2023 1:58 PM Barbara Gordon  MRN:  [id number]  Reason for admission: 33 yr old female who presented on 12/27 to [facility] after a Suicide Attempt (OD- Seroquel , Gabapentin ) and bizarre behavior, she was admitted to [facility] on 12/31.  PPHx is significant for Schizophrenia, MDD, Borderline Personality Disorder, and a history of Polysubstance Abuse (Amphetamines, Opioids, THC), and Multiple Prior Suicide Attempts (OD) and Multiple Prior Psychiatric Hospitalizations (last- [facility] 11/2022).   24 hr chart review: Vitals within normal limits, patient slept 8 hours as per nursing reports. She remains compliant with medications. Has not attended any unit group sessions in the past 24 hrs, and is staying isolative to her room.  Daily notes:  Barbara Gordon is seen outside her room this morning. Chart reviewed. Patient discussed with the treatment team during team meeting this morning. She is alert, oriented, linear & aware of situation. This is the most clear minded that this provider has observed from this patient since her admission. Barbara Gordon reports, I'm doing well here & on my medicines. I have no side effects from my medicines except the soreness to my right shoulder where they gave me the shots two days ago. I have attended group session this morning. I also went to breakfast. When I get discharged, I would like to go to the shelter. I would not want to go back to [facility] with his mom. [name] has a temper. He is a control freak. He does not allow me to talk to anyone but him & his mother. He is very controlling. Barbara Gordon remains compliant with her recommended treatment regimen. She has received the initial dose of her invega  sustenna. The 156 mg dose will be administered in 3 days from the day of the administration of the first dose, then will continue Q 28 days. Barbara Gordon denies to be referred to an Act team which could have benefited her on the long run. She currently denies any SIHI, AVH, delusional thoughts  or paranoia. She denies any side effects. See the current plan of care below. Vital signs remain stable.  Labs Reviewed: Ordered TSH, Vitamin D , and B12 yesterday, vitamin D  very low at 7, we will supplement with 50,000 units weekly.  TSH WNL.  B12 is pending.  Principal Problem: Disorganized schizophrenia, chronic condition (HCC)  Diagnosis: Principal Problem:   Disorganized schizophrenia, chronic condition (HCC)  Past Psychiatric History: See H&P.  Past Medical History:  Past Medical History:  Diagnosis Date   Hypothyroidism    Polysubstance abuse (HCC)    Schizophrenia (HCC)    History reviewed. No pertinent surgical history.  Family History: History reviewed. No pertinent family history.  Family Psychiatric  History: See H&P.  Social History:  Social History   Substance and Sexual Activity  Alcohol Use Not Currently   Comment: denies     Social History   Substance and Sexual Activity  Drug Use Not Currently   Types: Cocaine, Amphetamines, Methamphetamines, Marijuana   Comment: denies    Social History   Socioeconomic History   Marital status: Single    Spouse name: Not on file   Number of children: Not on file   Years of education: Not on file   Highest education level: Not on file  Occupational History   Not on file  Tobacco Use   Smoking status: Some Days    Current packs/day: 1.00    Types: Cigarettes, E-cigarettes   Smokeless tobacco: Never  Vaping Use   Vaping status: Never  Used  Substance and Sexual Activity   Alcohol use: Not Currently    Comment: denies   Drug use: Not Currently    Types: Cocaine, Amphetamines, Methamphetamines, Marijuana    Comment: denies   Sexual activity: Not Currently  Other Topics Concern   Not on file  Social History Narrative   Not on file   Social Drivers of Health   Financial Resource Strain: Low Risk  (11/20/2022)   Received from [facility]   Overall Financial Resource Strain (CARDIA)    Difficulty of  Paying Living Expenses: Not very hard  Food Insecurity: Patient Unable To Answer (11/07/2023)   Hunger Vital Sign    Worried About Running Out of Food in the Last Year: Patient unable to answer    Ran Out of Food in the Last Year: Patient unable to answer  Transportation Needs: Patient Unable To Answer (11/07/2023)   PRAPARE - Transportation    Lack of Transportation (Medical): Patient unable to answer    Lack of Transportation (Non-Medical): Patient unable to answer  Recent Concern: Transportation Needs - Unmet Transportation Needs (11/06/2023)   PRAPARE - Transportation    Lack of Transportation (Medical): Yes    Lack of Transportation (Non-Medical): Yes  Physical Activity: Not on file  Stress: No Stress Concern Present (11/20/2022)   Received from [facility] - Occupational Stress Questionnaire    Feeling of Stress : Only a little  Social Connections: Unknown (03/09/2022)   Received from [facility]   Social Network    Social Network: Not on file   Additional Social History:   Sleep: Good  Appetite:  Good  Current Medications: Current Facility-Administered Medications  Medication Dose Route Frequency Provider Last Rate Last Admin   acetaminophen  (TYLENOL ) tablet 650 mg  650 mg Oral Q6H PRN Starkes-Perry, Takia S, FNP       alum & mag hydroxide-simeth (MAALOX/MYLANTA) 200-200-20 MG/5ML suspension 30 mL  30 mL Oral Q4H PRN Starkes-Perry, Majel RAMAN, FNP       cyanocobalamin  (VITAMIN B12) tablet 1,000 mcg  1,000 mcg Oral Daily Parker, Alvin S, MD   1,000 mcg at 11/18/23 9190   haloperidol  (HALDOL ) tablet 5 mg  5 mg Oral TID PRN Johny Lot, MD   5 mg at 11/08/23 1426   And   diphenhydrAMINE  (BENADRYL ) capsule 50 mg  50 mg Oral TID PRN Johny Lot, MD   50 mg at 11/08/23 1426   haloperidol  lactate (HALDOL ) injection 5 mg  5 mg Intramuscular TID PRN Johny Lot, MD       And   diphenhydrAMINE  (BENADRYL )  injection 50 mg  50 mg Intramuscular TID PRN Massengill, Lot, MD       And   LORazepam  (ATIVAN ) injection 2 mg  2 mg Intramuscular TID PRN Massengill, Lot, MD       haloperidol  lactate (HALDOL ) injection 10 mg  10 mg Intramuscular TID PRN Johny Lot, MD       And   diphenhydrAMINE  (BENADRYL ) injection 50 mg  50 mg Intramuscular TID PRN Massengill, Lot, MD       And   LORazepam  (ATIVAN ) injection 2 mg  2 mg Intramuscular TID PRN Massengill, Lot, MD       FLUoxetine  (PROZAC ) capsule 20 mg  20 mg Oral Daily Nkwenti, Doris, NP   20 mg at 11/18/23 0809   gabapentin  (NEURONTIN ) capsule 300 mg  300 mg Oral QHS Wilkie Majel RAMAN, FNP  300 mg at 11/17/23 2104   hydrOXYzine  (ATARAX ) tablet 25 mg  25 mg Oral TID PRN Starkes-Perry, Takia S, FNP   25 mg at 11/16/23 1553   lisinopril  (ZESTRIL ) tablet 5 mg  5 mg Oral Daily Deonta Bomberger I, NP   5 mg at 11/18/23 9190   magnesium  hydroxide (MILK OF MAGNESIA) suspension 30 mL  30 mL Oral Daily PRN Starkes-Perry, Takia S, FNP       melatonin tablet 5 mg  5 mg Oral QHS Starkes-Perry, Takia S, FNP   5 mg at 11/17/23 2105   nicotine  (NICODERM CQ  - dosed in mg/24 hours) patch 14 mg  14 mg Transdermal Daily Wilkie Majel RAMAN, FNP   14 mg at 11/18/23 0809   ondansetron  (ZOFRAN -ODT) disintegrating tablet 4 mg  4 mg Oral Q6H PRN Collene Gouge I, NP   4 mg at 11/09/23 1834   [START ON 11/20/2023] paliperidone  (INVEGA  SUSTENNA) injection 156 mg  156 mg Intramuscular Q28 days Collene Gouge I, NP       QUEtiapine  (SEROQUEL ) tablet 50 mg  50 mg Oral QHS PRN Pashayan, Alexander S, MD   50 mg at 11/17/23 2104   risperiDONE  (RISPERDAL ) tablet 4 mg  4 mg Oral QHS Nkwenti, Donia, NP   4 mg at 11/17/23 2104   Vitamin D  (Ergocalciferol ) (DRISDOL ) 1.25 MG (50000 UNIT) capsule 50,000 Units  50,000 Units Oral Q7 days Parker, Alvin S, MD   50,000 Units at 11/15/23 1247   vitamin D3 (CHOLECALCIFEROL ) tablet 2,000 Units  2,000 Units Oral Daily Parker, Alvin S, MD    2,000 Units at 11/18/23 0809    Lab Results:  No results found for this or any previous visit (from the past 48 hours).   Blood Alcohol level:  Lab Results  Component Value Date   ETH <10 11/04/2023   ETH <10 10/27/2023   Metabolic Disorder Labs: Lab Results  Component Value Date   HGBA1C 5.1 11/10/2023   MPG 100 11/10/2023   MPG 88.19 04/18/2022   Lab Results  Component Value Date   PROLACTIN 189.0 (H) 04/16/2022   PROLACTIN 2.6 (L) 01/15/2022   Lab Results  Component Value Date   CHOL 213 (H) 11/10/2023   TRIG 90 11/10/2023   HDL 52 11/10/2023   CHOLHDL 4.1 11/10/2023   VLDL 18 11/10/2023   LDLCALC 143 (H) 11/10/2023   LDLCALC 133 (H) 01/15/2022   Physical Findings: AIMS:0 CIWA:  na COWS:  n/a  Musculoskeletal: Strength & Muscle Tone: within normal limits Gait & Station: normal Patient leans: N/A  Psychiatric Specialty Exam:  Presentation  General Appearance:  Casual; Fairly Groomed  Eye Contact: Good  Speech: Clear and Coherent; Normal Rate  Speech Volume: Normal  Handedness: Right   Mood and Affect  Mood: -- (improving)  Affect: Congruent  Thought Process  Thought Processes: Disorganized  Descriptions of Associations:Loose  Orientation:Partial  Thought Content:Scattered  History of Schizophrenia/Schizoaffective disorder:Yes  Duration of Psychotic Symptoms:Greater than six months  Hallucinations:Hallucinations: None   Ideas of Reference:None  Suicidal Thoughts:Suicidal Thoughts: No   Homicidal Thoughts:Homicidal Thoughts: No   Sensorium  Memory: Immediate Fair; Recent Fair; Remote Poor  Judgment: -- (Limited)  Insight: Lacking  Executive Functions  Concentration: Good  Attention Span: Good  Recall: Poor  Fund of Knowledge: Poor  Language: Fair  Psychomotor Activity  Psychomotor Activity: Psychomotor Activity: Normal    Assets  Assets: Desire for Improvement; Resilience; Physical  Health; Social Support  Sleep  Sleep: Sleep: Good Number  of Hours of Sleep: 8   Physical Exam: Physical Exam Vitals and nursing note reviewed.  HENT:     Nose: Nose normal.  Pulmonary:     Effort: Pulmonary effort is normal.  Genitourinary:    Comments: Deferred Musculoskeletal:        General: Normal range of motion.  Skin:    General: Skin is warm and dry.  Neurological:     General: No focal deficit present.     Mental Status: She is alert and oriented to person, place, and time.    Review of Systems  Constitutional:  Negative for chills and fever.  Gastrointestinal:  Negative for abdominal pain, constipation, diarrhea and nausea.  Musculoskeletal:  Negative for joint pain and myalgias.  Neurological:  Negative for loss of consciousness and headaches.  Psychiatric/Behavioral:  Positive for substance abuse. Negative for hallucinations and memory loss. The patient is nervous/anxious. The patient does not have insomnia.   All other systems reviewed and are negative.  Blood pressure 122/80, pulse 92, temperature (!) 97.3 F (36.3 C), temperature source Oral, resp. rate 16, height 5' 1 (1.549 m), weight 54.4 kg, last menstrual period 11/04/2023, SpO2 98%. Body mass index is 22.67 kg/m.  Treatment Plan Summary: Daily contact with patient to assess and evaluate symptoms and progress in treatment and Medication management.   Continue inpatient hospitalization.  Will continue today 11/18/2023 plan as below except where it is noted. Will increase risperdal , as it appears patient is having paranoia and this may be why she feels off. Patient also continues to ask questions about basic tasks, indicating possible continued thought blocking.   Schizophrenia: -Continue Prozac  20 mg daily for depressive symptoms on 1/8 -Give Invega  Sustenna 234 mg IM today 11/16/23 (Completed).  -Continue Invega  sustenna 156 mg IM on 11-20-23 & Q 28 days for mood control. -Continue 4 mg po Q HS for  Psychosis -Continue Vit D 50.000 units weekly for low Vit D levels  -Continue Gabapentin  300 mg TID for anxiety -Continue Agitation Protocol: Haldol /Ativan /Benadryl .  -Continue Melatonin 5 mg po q hs for insomnia.  -Continue Seroquel  50 mg po Q hs prn for insomnia. -Completed Lisinopril  10 mg po once today for HTN (11-11-23).  -Continue Lisinopril  5 mg po daily for HTN (Start 11-12-23).   Nicotine  Dependence: -Continue Nicotine  Patch 14 mg daily   -Continue Melatonin 5 mg QHS for Insomnia -Change Seroquel  50 mg to QHS PRN for Insomnia -Continue PRN's: Tylenol , Maalox, Atarax , Milk of Magnesia, Trazodone   Total Time spent with patient:  I personally spent 45 minutes on the unit in direct patient care. The direct patient care time included face-to-face time with the patient, reviewing the patient's chart, communicating with other professionals, and coordinating care. Greater than 50% of this time was spent in counseling or coordinating care with the patient regarding goals of hospitalization, psycho-education, and discharge planning needs.  Mac Bolster, NP 11/18/2023, 1:58 PM Patient ID: Yoona Ishii, female   DOB: 01/29/1991, 33 y.o.   MRN: [id number]  Patient ID: Adonica Fukushima, female   DOB: Jan 13, 1991, 33 y.o.   MRN: [id number] Patient ID: Tamre Cass, female   DOB: Jun 12, 1991, 33 y.o.   MRN: [id number] Patient ID: Parveen Freehling, female   DOB: [birth year], 33 y.o.   MRN: [id number] Patient ID: Chantrell Apsey, female   DOB: [birth year], 33 y.o.   MRN: [id number]

## 2023-11-18 NOTE — Progress Notes (Signed)
   11/17/23 2029  Psych Admission Type (Psych Patients Only)  Admission Status Involuntary  Psychosocial Assessment  Patient Complaints Anxiety  Eye Contact Fair  Facial Expression Animated  Affect Anxious  Speech Soft  Interaction Cautious  Motor Activity Slow  Appearance/Hygiene Unremarkable  Behavior Characteristics Cooperative;Appropriate to situation  Mood Anxious  Thought Process  Coherency WDL  Content WDL  Delusions None reported or observed  Perception WDL  Hallucination None reported or observed  Judgment Impaired  Confusion None  Danger to Self  Current suicidal ideation? Denies  Agreement Not to Harm Self Yes  Description of Agreement verbal  Danger to Others  Danger to Others None reported or observed

## 2023-11-18 NOTE — B[facility] Group Notes (Signed)

## 2023-11-18 NOTE — Progress Notes (Signed)
   11/18/23 0809  Psych Admission Type (Psych Patients Only)  Admission Status Involuntary  Psychosocial Assessment  Patient Complaints None  Eye Contact Fair  Facial Expression Flat  Affect Anxious  Speech Soft  Interaction Cautious  Motor Activity Slow  Appearance/Hygiene Unremarkable  Behavior Characteristics Cooperative  Mood Pleasant  Thought Process  Coherency WDL  Content WDL  Delusions None reported or observed  Perception WDL  Hallucination None reported or observed  Judgment Impaired  Confusion None  Danger to Self  Current suicidal ideation? Denies  Agreement Not to Harm Self Yes  Description of Agreement verbal  Danger to Others  Danger to Others None reported or observed

## 2023-11-18 NOTE — BH IP Treatment Plan (Signed)
 Interdisciplinary Treatment and Diagnostic Plan Update  11/18/2023 Time of Session: 11:40 AM - Barbara Gordon MRN: [id number]  Principal Diagnosis: Disorganized schizophrenia, chronic condition (HCC)  Secondary Diagnoses: Principal Problem:   Disorganized schizophrenia, chronic condition (HCC)   Current Medications:  Current Facility-Administered Medications  Medication Dose Route Frequency Provider Last Rate Last Admin   acetaminophen  (TYLENOL ) tablet 650 mg  650 mg Oral Q6H PRN Starkes-Perry, Takia S, FNP       alum & mag hydroxide-simeth (MAALOX/MYLANTA) 200-200-20 MG/5ML suspension 30 mL  30 mL Oral Q4H PRN Starkes-Perry, Takia S, FNP       cyanocobalamin  (VITAMIN B12) tablet 1,000 mcg  1,000 mcg Oral Daily Parker, Alvin S, MD   1,000 mcg at 11/18/23 9190   haloperidol  (HALDOL ) tablet 5 mg  5 mg Oral TID PRN Johny Lot, MD   5 mg at 11/08/23 1426   And   diphenhydrAMINE  (BENADRYL ) capsule 50 mg  50 mg Oral TID PRN Johny Lot, MD   50 mg at 11/08/23 1426   haloperidol  lactate (HALDOL ) injection 5 mg  5 mg Intramuscular TID PRN Johny Lot, MD       And   diphenhydrAMINE  (BENADRYL ) injection 50 mg  50 mg Intramuscular TID PRN Massengill, Lot, MD       And   LORazepam  (ATIVAN ) injection 2 mg  2 mg Intramuscular TID PRN Massengill, Lot, MD       haloperidol  lactate (HALDOL ) injection 10 mg  10 mg Intramuscular TID PRN Massengill, Lot, MD       And   diphenhydrAMINE  (BENADRYL ) injection 50 mg  50 mg Intramuscular TID PRN Massengill, Lot, MD       And   LORazepam  (ATIVAN ) injection 2 mg  2 mg Intramuscular TID PRN Massengill, Lot, MD       FLUoxetine  (PROZAC ) capsule 20 mg  20 mg Oral Daily Nkwenti, Doris, NP   20 mg at 11/18/23 9190   gabapentin  (NEURONTIN ) capsule 300 mg  300 mg Oral QHS Starkes-Perry, Takia S, FNP   300 mg at 11/17/23 2104   hydrOXYzine  (ATARAX ) tablet 25 mg  25 mg Oral TID PRN Wilkie Majel RAMAN, FNP   25 mg at  11/16/23 1553   lisinopril  (ZESTRIL ) tablet 5 mg  5 mg Oral Daily Nwoko, Agnes I, NP   5 mg at 11/18/23 0809   magnesium  hydroxide (MILK OF MAGNESIA) suspension 30 mL  30 mL Oral Daily PRN Starkes-Perry, Takia S, FNP       melatonin tablet 5 mg  5 mg Oral QHS Starkes-Perry, Takia S, FNP   5 mg at 11/17/23 2105   nicotine  (NICODERM CQ  - dosed in mg/24 hours) patch 14 mg  14 mg Transdermal Daily Wilkie Majel RAMAN, FNP   14 mg at 11/18/23 0809   ondansetron  (ZOFRAN -ODT) disintegrating tablet 4 mg  4 mg Oral Q6H PRN Collene Gouge I, NP   4 mg at 11/09/23 1834   [START ON 11/20/2023] paliperidone  (INVEGA  SUSTENNA) injection 156 mg  156 mg Intramuscular Q28 days Collene Gouge I, NP       QUEtiapine  (SEROQUEL ) tablet 50 mg  50 mg Oral QHS PRN Pashayan, Alexander S, MD   50 mg at 11/17/23 2104   risperiDONE  (RISPERDAL ) tablet 4 mg  4 mg Oral QHS Nkwenti, Doris, NP   4 mg at 11/17/23 2104   Vitamin D  (Ergocalciferol ) (DRISDOL ) 1.25 MG (50000 UNIT) capsule 50,000 Units  50,000 Units Oral Q7 days Kennyth Starleen RAMAN, MD  50,000 Units at 11/15/23 1247   vitamin D3 (CHOLECALCIFEROL ) tablet 2,000 Units  2,000 Units Oral Daily Parker, Alvin S, MD   2,000 Units at 11/18/23 0809   PTA Medications: Medications Prior to Admission  Medication Sig Dispense Refill Last Dose/Taking   gabapentin  (NEURONTIN ) 100 MG capsule Take 3 capsules (300 mg total) by mouth at bedtime. 60 capsule 0    OLANZapine  (ZYPREXA ) 5 MG tablet Take 5 mg by mouth at bedtime.      QUEtiapine  (SEROQUEL ) 50 MG tablet Take 1 tablet (50 mg total) by mouth at bedtime. 30 tablet 0     Patient Stressors: Medication change or noncompliance   Other: Patient unable to identify any stressors    Patient Strengths: Forensic Psychologist fund of knowledge  Motivation for treatment/growth  Supportive family/friends   Treatment Modalities: Medication Management, Group therapy, Case management,  1 to 1 session with clinician, Psychoeducation,  Recreational therapy.   Physician Treatment Plan for Primary Diagnosis: Disorganized schizophrenia, chronic condition (HCC) Long Term Goal(s): Improvement in symptoms so as ready for discharge   Short Term Goals: Ability to maintain clinical measurements within normal limits will improve Compliance with prescribed medications will improve Ability to identify triggers associated with substance abuse/mental health issues will improve  Medication Management: Evaluate patient's response, side effects, and tolerance of medication regimen.  Therapeutic Interventions: 1 to 1 sessions, Unit Group sessions and Medication administration.  Evaluation of Outcomes: Progressing  Physician Treatment Plan for Secondary Diagnosis: Principal Problem:   Disorganized schizophrenia, chronic condition (HCC)  Long Term Goal(s): Improvement in symptoms so as ready for discharge   Short Term Goals: Ability to maintain clinical measurements within normal limits will improve Compliance with prescribed medications will improve Ability to identify triggers associated with substance abuse/mental health issues will improve     Medication Management: Evaluate patient's response, side effects, and tolerance of medication regimen.  Therapeutic Interventions: 1 to 1 sessions, Unit Group sessions and Medication administration.  Evaluation of Outcomes: Progressing   RN Treatment Plan for Primary Diagnosis: Disorganized schizophrenia, chronic condition (HCC) Long Term Goal(s): Knowledge of disease and therapeutic regimen to maintain health will improve  Short Term Goals: Ability to remain free from injury will improve, Ability to verbalize frustration and anger appropriately will improve, Ability to participate in decision making will improve, Ability to verbalize feelings will improve, Ability to identify and develop effective coping behaviors will improve, and Compliance with prescribed medications will  improve  Medication Management: RN will administer medications as ordered by provider, will assess and evaluate patient's response and provide education to patient for prescribed medication. RN will report any adverse and/or side effects to prescribing provider.  Therapeutic Interventions: 1 on 1 counseling sessions, Psychoeducation, Medication administration, Evaluate responses to treatment, Monitor vital signs and CBGs as ordered, Perform/monitor CIWA, COWS, AIMS and Fall Risk screenings as ordered, Perform wound care treatments as ordered.  Evaluation of Outcomes: Progressing   LCSW Treatment Plan for Primary Diagnosis: Disorganized schizophrenia, chronic condition (HCC) Long Term Goal(s): Safe transition to appropriate next level of care at discharge, Engage patient in therapeutic group addressing interpersonal concerns.  Short Term Goals: Engage patient in aftercare planning with referrals and resources, Increase social support, Increase emotional regulation, Facilitate patient progression through stages of change regarding substance use diagnoses and concerns, and Increase skills for wellness and recovery  Therapeutic Interventions: Assess for all discharge needs, 1 to 1 time with Social worker, Explore available resources and support systems, Assess for adequacy  in community support network, Educate family and significant other(s) on suicide prevention, Complete Psychosocial Assessment, Interpersonal group therapy.  Evaluation of Outcomes: Progressing   Progress in Treatment: Attending groups: Yes. Participating in groups: Yes.   Taking medication as prescribed: Yes. Toleration medication: Yes. Family/Significant other contact made: Barbara Gordon (friend) - [phone] Patient understands diagnosis: Yes. Discussing patient identified problems/goals with staff: Yes. Medical problems stabilized or resolved: Yes. Denies suicidal/homicidal ideation: Yes. Issues/concerns per patient  self-inventory: No.   New problem(s) identified:  No   New Short Term/Long Term Goal(s):     medication stabilization, elimination of SI thoughts, development of comprehensive mental wellness plan.     Patient Goals: They said I took too many sleeping pills. I had crazy thoughts.  I thought we were drinking blood.  I thought somebody killed my kid.  I'm staying with my friend and his mom, but he is controlling.  Sometimes he is mean and has anger problems.  They let me stay there for free.  Patient said that stress related to living in this environment contributed to her symptoms.  She would like to go to a sober living home or a home through [address].   Patient said that he has a 72 year old child who stays with his grandma.  I still have custody.  I don't have any income.  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    Discharge Plan or Barriers:  Patient recently admitted. CSW will continue to follow and assess for appropriate referrals and possible discharge planning.     Reason for Continuation of Hospitalization: Hallucinations Medication stabilization Suicidal ideation   Estimated Length of Stay:  3 - 4 days  Last 3 [place] Suicide Severity Risk Score: Flowsheet Row [facility] (Current[facility] Most recent reading at 11/07/2023 10:00 PM ED to Hosp-Admission (Discharged) from 11/04/2023 in [facility] LONG 4TH FLOOR PROGRESSIVE CARE AND UROLOGY Most recent reading at 11/06/2023  4:33 AM ED from 11/04/2023 in [facility] Most recent reading at 11/04/2023  8:17 PM  C-SSRS RISK CATEGORY Low Risk High Risk Low Risk       Last PHQ 2/9 Scores:    06/27/2023    3:05 PM 06/06/2023    3:40 PM 07/01/2022    3:49 PM  Depression screen PHQ 2/9  Decreased Interest 0 0 0  Down, Depressed, Hopeless 0 0 0  PHQ - 2 Score 0 0 0  Altered sleeping   0  Tired, decreased energy   0  Change in appetite    0  Feeling bad or failure about yourself    0  Trouble concentrating   0  Moving slowly or fidgety/restless   0  Suicidal thoughts   0  PHQ-9 Score   0    Scribe for Treatment Team: Sultana Tierney O Nusrat Encarnacion, LCSWA 11/18/2023 9:46 AM

## 2023-11-18 NOTE — Group Note (Signed)
 Recreation Therapy Group Note   Group Topic:Health and Wellness  Group Date: 11/18/2023 Start Time: 0930 End Time: 1007 Facilitators: Lelia Jons-McCall, LRT,CTRS Location: [address] Dayroom   Group Topic: Wellness  Goal Area(s) Addresses:  Patient will define components of whole wellness. Patient will verbalize benefit of whole wellness.  Group Description: LRT and patients discussed the importance of exercise and its affect on your body and lifestyle. Patients took turns leading the group in the exercises of their choosing. Patients also chose the music they wanted to exercise to as well.    Education: Wellness, Building Control Surveyor.   Education Outcome: Acknowledges education/In group clarification offered/Needs additional education.    Affect/Mood: Appropriate   Participation Level: Engaged   Participation Quality: Independent   Behavior: Appropriate   Speech/Thought Process: Focused   Insight: Good   Judgement: Good   Modes of Intervention: Music   Patient Response to Interventions:  Engaged   Education Outcome:  In group clarification offered    Clinical Observations/Individualized Feedback: Pt was bright and engaging during group. Pt interacted well with peers and attempted every exercise presented in group.     Plan: Continue to engage patient in RT group sessions 2-3x/week.   Thaddeaus Monica-McCall, LRT,CTRS 11/18/2023 11:42 AM

## 2023-11-19 DIAGNOSIS — F201 Disorganized schizophrenia: Secondary | ICD-10-CM | POA: Diagnosis not present

## 2023-11-19 LAB — VITAMIN B1: Vitamin B1 (Thiamine): 142.2 nmol/L (ref 66.5–200.0)

## 2023-11-19 NOTE — Plan of Care (Signed)
  Problem: Education: Goal: Knowledge of [facility] Education information/materials will improve Outcome: Progressing Goal: Emotional status will improve Outcome: Progressing Goal: Mental status will improve Outcome: Progressing Goal: Verbalization of understanding the information provided will improve Outcome: Progressing   Problem: Activity: Goal: Interest or engagement in activities will improve Outcome: Progressing Goal: Sleeping patterns will improve Outcome: Progressing   Problem: Coping: Goal: Ability to verbalize frustrations and anger appropriately will improve Outcome: Progressing Goal: Ability to demonstrate self-control will improve Outcome: Progressing   Problem: Health Behavior/Discharge Planning: Goal: Identification of resources available to assist in meeting health care needs will improve Outcome: Progressing Goal: Compliance with treatment plan for underlying cause of condition will improve Outcome: Progressing   Problem: Physical Regulation: Goal: Ability to maintain clinical measurements within normal limits will improve Outcome: Progressing   Problem: Safety: Goal: Periods of time without injury will increase Outcome: Progressing   Problem: Education: Goal: Ability to make informed decisions regarding treatment will improve Outcome: Progressing   Problem: Coping: Goal: Coping ability will improve Outcome: Progressing   Problem: Health Behavior/Discharge Planning: Goal: Identification of resources available to assist in meeting health care needs will improve Outcome: Progressing   Problem: Medication: Goal: Compliance with prescribed medication regimen will improve Outcome: Progressing   Problem: Self-Concept: Goal: Ability to disclose and discuss suicidal ideas will improve Outcome: Progressing Goal: Will verbalize positive feelings about self Outcome: Progressing Note: Patient is on track. Patient will maintain adherence

## 2023-11-19 NOTE — Progress Notes (Addendum)
 [facility] MD Progress Note  11/19/2023 10:55 AM Barbara Gordon  MRN:  [id number]  Reason for admission: 33 yr old female who presented on 12/27 to [facility] after a Suicide Attempt (OD- Seroquel , Gabapentin ) and bizarre behavior, she was admitted to [facility] on 12/31.  PPHx is significant for Schizophrenia, MDD, Borderline Personality Disorder, and a history of Polysubstance Abuse (Amphetamines, Opioids, THC), and Multiple Prior Suicide Attempts (OD) and Multiple Prior Psychiatric Hospitalizations (last- [facility] 11/2022).   24 hr chart review: Vitals within normal limits, patient slept 8 hours as per nursing reports. She remains compliant with medications. Has not attended any unit group sessions in the past 24 hrs, and is staying isolative to her room.  Daily notes: Barbara Gordon is seen, chart reviewed. Patient continues to show improving mood since receiving her Invega  injectable three days ago. Her second dose of Invega  injectable will be due tomorrow 11-20-23. During this evaluation, she reports, I'm doing better. I feel happy because my mind is clearing. I'm remembering stuff now. I'm sleeping well too. Barbara Gordon is gradually showing improved mood, behavior & mannerism. She is visible on the unit, attending group sessions. She  remains compliant with her recommended treatment regimen. She has received the initial dose of her invega  sustenna 3 days ago. The 156 mg dose will be administered  tomorrow 11-20-23. She will continue this medicine Q 28 days from 11-20-23. Barbara Gordon continues to deny referral to an Act team which could have benefited her on the long run after discharge. She expressed yesterday that she will not be going back to her friend, [name]'s home that he shares with his mother because [name] has a bad temper & does not allow her to talk to anybody. She stated yesterday that she will rather go to a homeless shelter after discharge. She currently denies any SIHI, AVH, delusional thoughts or paranoia. She denies any side  effects from her medications. See the current plan of care below as noted below. There are no changes made. Vital signs remain stable.   Principal Problem: Disorganized schizophrenia, chronic condition (HCC)  Diagnosis: Principal Problem:   Disorganized schizophrenia, chronic condition (HCC)  Past Psychiatric History: See H&P.  Past Medical History:  Past Medical History:  Diagnosis Date   Hypothyroidism    Polysubstance abuse (HCC)    Schizophrenia (HCC)    History reviewed. No pertinent surgical history.  Family History: History reviewed. No pertinent family history.  Family Psychiatric  History: See H&P.  Social History:  Social History   Substance and Sexual Activity  Alcohol Use Not Currently   Comment: denies     Social History   Substance and Sexual Activity  Drug Use Not Currently   Types: Cocaine, Amphetamines, Methamphetamines, Marijuana   Comment: denies    Social History   Socioeconomic History   Marital status: Single    Spouse name: Not on file   Number of children: Not on file   Years of education: Not on file   Highest education level: Not on file  Occupational History   Not on file  Tobacco Use   Smoking status: Some Days    Current packs/day: 1.00    Types: Cigarettes, E-cigarettes   Smokeless tobacco: Never  Vaping Use   Vaping status: Never Used  Substance and Sexual Activity   Alcohol use: Not Currently    Comment: denies   Drug use: Not Currently    Types: Cocaine, Amphetamines, Methamphetamines, Marijuana    Comment: denies   Sexual activity: Not Currently  Other Topics Concern   Not on file  Social History Narrative   Not on file   Social Drivers of Health   Financial Resource Strain: Low Risk  (11/20/2022)   Received from [facility]   Overall Financial Resource Strain (CARDIA)    Difficulty of Paying Living Expenses: Not very hard  Food Insecurity: Patient Unable To Answer (11/07/2023)   Hunger Vital Sign    Worried  About Running Out of Food in the Last Year: Patient unable to answer    Ran Out of Food in the Last Year: Patient unable to answer  Transportation Needs: Patient Unable To Answer (11/07/2023)   PRAPARE - Transportation    Lack of Transportation (Medical): Patient unable to answer    Lack of Transportation (Non-Medical): Patient unable to answer  Recent Concern: Transportation Needs - Unmet Transportation Needs (11/06/2023)   PRAPARE - Transportation    Lack of Transportation (Medical): Yes    Lack of Transportation (Non-Medical): Yes  Physical Activity: Not on file  Stress: No Stress Concern Present (11/20/2022)   [facility] - Occupational Stress Questionnaire    Feeling of Stress : Only a little  Social Connections: Unknown (03/09/2022)   Received [facility]   Social Network    Social Network: Not on file   Additional Social History:   Sleep: Good  Appetite:  Good  Current Medications: Current Facility-Administered Medications  Medication Dose Route Frequency Provider Last Rate Last Admin   acetaminophen  (TYLENOL ) tablet 650 mg  650 mg Oral Q6H PRN Starkes-Perry, Takia S, FNP       alum & mag hydroxide-simeth (MAALOX/MYLANTA) 200-200-20 MG/5ML suspension 30 mL  30 mL Oral Q4H PRN Starkes-Perry, Majel RAMAN, FNP       cyanocobalamin  (VITAMIN B12) tablet 1,000 mcg  1,000 mcg Oral Daily Parker, Alvin S, MD   1,000 mcg at 11/19/23 0830   haloperidol  (HALDOL ) tablet 5 mg  5 mg Oral TID PRN Johny Lot, MD   5 mg at 11/08/23 1426   And   diphenhydrAMINE  (BENADRYL ) capsule 50 mg  50 mg Oral TID PRN Johny Lot, MD   50 mg at 11/08/23 1426   haloperidol  lactate (HALDOL ) injection 5 mg  5 mg Intramuscular TID PRN Johny Lot, MD       And   diphenhydrAMINE  (BENADRYL ) injection 50 mg  50 mg Intramuscular TID PRN Massengill, Lot, MD       And   LORazepam  (ATIVAN ) injection 2 mg  2 mg  Intramuscular TID PRN Johny Lot, MD       haloperidol  lactate (HALDOL ) injection 10 mg  10 mg Intramuscular TID PRN Massengill, Lot, MD       And   diphenhydrAMINE  (BENADRYL ) injection 50 mg  50 mg Intramuscular TID PRN Massengill, Lot, MD       And   LORazepam  (ATIVAN ) injection 2 mg  2 mg Intramuscular TID PRN Massengill, Lot, MD       FLUoxetine  (PROZAC ) capsule 20 mg  20 mg Oral Daily Nkwenti, Doris, NP   20 mg at 11/19/23 0830   gabapentin  (NEURONTIN ) capsule 300 mg  300 mg Oral QHS Starkes-Perry, Takia S, FNP   300 mg at 11/18/23 2058   hydrOXYzine  (ATARAX ) tablet 25 mg  25 mg Oral TID PRN Starkes-Perry, Takia S, FNP   25 mg at 11/16/23 1553   lisinopril  (ZESTRIL ) tablet 5 mg  5 mg Oral Daily Jazarah Capili  I, NP   5 mg at 11/19/23 0830   magnesium  hydroxide (MILK OF MAGNESIA) suspension 30 mL  30 mL Oral Daily PRN Starkes-Perry, Takia S, FNP       melatonin tablet 5 mg  5 mg Oral QHS Starkes-Perry, Takia S, FNP   5 mg at 11/18/23 2058   nicotine  (NICODERM CQ  - dosed in mg/24 hours) patch 14 mg  14 mg Transdermal Daily Wilkie Majel RAMAN, FNP   14 mg at 11/19/23 0830   ondansetron  (ZOFRAN -ODT) disintegrating tablet 4 mg  4 mg Oral Q6H PRN Collene Gouge I, NP   4 mg at 11/09/23 1834   [START ON 11/20/2023] paliperidone  (INVEGA  SUSTENNA) injection 156 mg  156 mg Intramuscular Q28 days Collene Gouge I, NP       QUEtiapine  (SEROQUEL ) tablet 50 mg  50 mg Oral QHS PRN Pashayan, Alexander S, MD   50 mg at 11/18/23 2058   risperiDONE  (RISPERDAL ) tablet 4 mg  4 mg Oral QHS Nkwenti, Donia, NP   4 mg at 11/18/23 2058   Vitamin D  (Ergocalciferol ) (DRISDOL ) 1.25 MG (50000 UNIT) capsule 50,000 Units  50,000 Units Oral Q7 days Parker, Alvin S, MD   50,000 Units at 11/15/23 1247   vitamin D3 (CHOLECALCIFEROL ) tablet 2,000 Units  2,000 Units Oral Daily Parker, Alvin S, MD   2,000 Units at 11/19/23 0830   Lab Results:  No results found for this or any previous visit (from the past 48  hours).   Blood Alcohol level:  Lab Results  Component Value Date   ETH <10 11/04/2023   ETH <10 10/27/2023   Metabolic Disorder Labs: Lab Results  Component Value Date   HGBA1C 5.1 11/10/2023   MPG 100 11/10/2023   MPG 88.19 04/18/2022   Lab Results  Component Value Date   PROLACTIN 189.0 (H) 04/16/2022   PROLACTIN 2.6 (L) 01/15/2022   Lab Results  Component Value Date   CHOL 213 (H) 11/10/2023   TRIG 90 11/10/2023   HDL 52 11/10/2023   CHOLHDL 4.1 11/10/2023   VLDL 18 11/10/2023   LDLCALC 143 (H) 11/10/2023   LDLCALC 133 (H) 01/15/2022   Physical Findings: AIMS:0 CIWA:  na COWS:  n/a  Musculoskeletal: Strength & Muscle Tone: within normal limits Gait & Station: normal Patient leans: N/A  Psychiatric Specialty Exam:  Presentation  General Appearance:  Appropriate for Environment; Fairly Groomed  Eye Contact: Good  Speech: Clear and Coherent; Normal Rate  Speech Volume: Normal  Handedness: Right   Mood and Affect  Mood: -- (Improving)  Affect: Congruent  Thought Process  Thought Processes: Coherent; Goal Directed; Linear  Descriptions of Associations:Intact  Orientation:Full (Time, Place and Person)  Thought Content:-- (Less disorganized.)  History of Schizophrenia/Schizoaffective disorder:Yes  Duration of Psychotic Symptoms:Greater than six months  Hallucinations:Hallucinations: None   Ideas of Reference:None  Suicidal Thoughts:Suicidal Thoughts: No   Homicidal Thoughts:Homicidal Thoughts: No  Sensorium  Memory: Immediate Good; Recent Good; Remote Fair  Judgment: Fair  Insight: Fair  Art Therapist  Concentration: Good  Attention Span: Good  Recall: Good  Fund of Knowledge: Fair  Language: Good  Psychomotor Activity  Psychomotor Activity: Psychomotor Activity: Normal  Assets  Assets: Communication Skills; Desire for Improvement; Physical Health; Resilience  Sleep  Sleep: Sleep:  Good Number of Hours of Sleep: 8  Physical Exam: Physical Exam Vitals and nursing note reviewed.  HENT:     Nose: Nose normal.  Pulmonary:     Effort: Pulmonary effort is normal.  Genitourinary:  Comments: Deferred Musculoskeletal:        General: Normal range of motion.  Skin:    General: Skin is warm and dry.  Neurological:     General: No focal deficit present.     Mental Status: She is alert and oriented to person, place, and time.    Review of Systems  Constitutional:  Negative for chills and fever.  Gastrointestinal:  Negative for abdominal pain, constipation, diarrhea and nausea.  Musculoskeletal:  Negative for joint pain and myalgias.  Neurological:  Negative for loss of consciousness and headaches.  Psychiatric/Behavioral:  Positive for substance abuse. Negative for hallucinations and memory loss. The patient is nervous/anxious. The patient does not have insomnia.   All other systems reviewed and are negative.  Blood pressure 127/71, pulse 92, temperature 98.5 F (36.9 C), temperature source Oral, resp. rate 16, height 5' 1 (1.549 m), weight 54.4 kg, last menstrual period 11/04/2023, SpO2 99%. Body mass index is 22.67 kg/m.  Treatment Plan Summary: Daily contact with patient to assess and evaluate symptoms and progress in treatment and Medication management.   Continue inpatient hospitalization.  Will continue today 11/19/2023 plan as below except where it is noted.   Schizophrenia: -Continue Prozac  20 mg daily for depressive symptoms on 1/8 -Give Invega  Sustenna 234 mg IM today 11/16/23 (Completed).  -Continue Invega  sustenna 156 mg IM on 11-20-23 & Q 28 days for mood control. -Continue 4 mg po Q HS for Psychosis -Continue Vit D 50.000 units weekly for low Vit D levels  -Continue Gabapentin  300 mg TID for anxiety -Continue Agitation Protocol: Haldol /Ativan /Benadryl .  -Continue Melatonin 5 mg po q hs for insomnia.  -Continue Seroquel  50 mg po Q hs prn for  insomnia. -Completed Lisinopril  10 mg po once today for HTN (11-11-23).  -Continue Lisinopril  5 mg po daily for HTN (Start 11-12-23).   Nicotine  Dependence: -Continue Nicotine  Patch 14 mg daily   -Continue Melatonin 5 mg QHS for Insomnia -Change Seroquel  50 mg to QHS PRN for Insomnia -Continue PRN's: Tylenol , Maalox, Atarax , Milk of Magnesia, Trazodone   Mac Bolster, NP, pmhnp, fnp-bc. 11/19/2023, 10:55 AM Patient ID: Royetta Sa, female   DOB: 1991/09/06, 33 y.o.   MRN: [id number]  Patient ID: Isaiah Torok, female   DOB: 10-22-1991, 33 y.o.   MRN: [id number] Patient ID: Kailie Polus, female   DOB: 01-Aug-1991, 33 y.o.   MRN: [id number] Patient ID: Destynee Stringfellow, female   DOB: 14-Nov-1990, 33 y.o.   MRN: [id number] Patient ID: Suzanne Kho, female   DOB: [birth year], 33 y.o.   MRN: [id number] Patient ID: Breauna Mazzeo, female   DOB: 12-Feb-1991, 33 y.o.   MRN: [id number]

## 2023-11-19 NOTE — [facility] Group Notes (Signed)
 [facility] Group Notes:  (Nursing)  Date:  11/19/2023  Time: 1300  Type of Therapy:  Psychoeducational Skills  Participation Level:  Minimal  Participation Quality:  Attentive  Affect:  Flat  Cognitive:  Lacking  Insight:  Improving  Engagement in Group:  Limited  Modes of Intervention:  Activity, Discussion, Exploration, Rapport Building, Socialization, and Support  Summary of Progress/Problems: Pt arrived to group late, so was unable to fully participate  Barbara Gordon 11/19/2023, 5:47 PM

## 2023-11-19 NOTE — [facility] Group Notes (Signed)
 [facility] Group Notes:  (Nursing/MHT/Case Management/Adjunct)  Date:  11/19/2023  Time:  2000  Type of Therapy:   Wrap up group  Participation Level:  Active  Participation Quality:  Appropriate, Attentive, Sharing, and Supportive  Affect:  Appropriate  Cognitive:  Alert  Insight:  Lacking  Engagement in Group:  Developing/Improving  Modes of Intervention:  Clarification, Education, and Support  Summary of Progress/Problems: Positive thinking and positive change.   Barbara Gordon 11/19/2023, 9:45 PM

## 2023-11-19 NOTE — Progress Notes (Signed)
 Pt rates depression 0/10 and anxiety 0/10. Pt reports a good appetite, and no physical problems. Pt denies SI/HI/AVH and verbally contracts for safety. Provided support and encouragement. Pt safe on the unit. Q 15 minute safety checks continued.

## 2023-11-20 DIAGNOSIS — F201 Disorganized schizophrenia: Secondary | ICD-10-CM | POA: Diagnosis not present

## 2023-11-20 NOTE — Plan of Care (Signed)
  Problem: Education: Goal: Knowledge of [facility] Education information/materials will improve Outcome: Progressing Goal: Emotional status will improve Outcome: Progressing Goal: Mental status will improve Outcome: Progressing Goal: Verbalization of understanding the information provided will improve Outcome: Progressing   Problem: Activity: Goal: Interest or engagement in activities will improve Outcome: Progressing Goal: Sleeping patterns will improve Outcome: Progressing   Problem: Coping: Goal: Ability to verbalize frustrations and anger appropriately will improve Outcome: Progressing Goal: Ability to demonstrate self-control will improve Outcome: Progressing   Problem: Health Behavior/Discharge Planning: Goal: Identification of resources available to assist in meeting health care needs will improve Outcome: Progressing Goal: Compliance with treatment plan for underlying cause of condition will improve Outcome: Progressing   Problem: Physical Regulation: Goal: Ability to maintain clinical measurements within normal limits will improve Outcome: Progressing   Problem: Safety: Goal: Periods of time without injury will increase Outcome: Progressing   Problem: Education: Goal: Ability to make informed decisions regarding treatment will improve Outcome: Progressing   Problem: Coping: Goal: Coping ability will improve Outcome: Progressing   Problem: Health Behavior/Discharge Planning: Goal: Identification of resources available to assist in meeting health care needs will improve Outcome: Progressing   Problem: Medication: Goal: Compliance with prescribed medication regimen will improve Outcome: Progressing   Problem: Self-Concept: Goal: Ability to disclose and discuss suicidal ideas will improve Outcome: Progressing Goal: Will verbalize positive feelings about self Outcome: Progressing Note: Patient is on track. Patient will maintain adherence

## 2023-11-20 NOTE — Plan of Care (Signed)
  Problem: Education: Goal: Verbalization of understanding the information provided will improve Outcome: Progressing   Problem: Coping: Goal: Ability to verbalize frustrations and anger appropriately will improve Outcome: Progressing Goal: Ability to demonstrate self-control will improve Outcome: Progressing

## 2023-11-20 NOTE — Progress Notes (Signed)
   11/19/23 2300  Psych Admission Type (Psych Patients Only)  Admission Status Involuntary  Psychosocial Assessment  Patient Complaints Insomnia (Requested Seroquel )  Eye Contact Fair  Facial Expression Flat  Affect Flat  Speech Slow (minimal, monotone)  Interaction Cautious  Motor Activity  (WDL)  Appearance/Hygiene Unremarkable  Behavior Characteristics Cooperative  Mood Euthymic  Thought Process  Coherency Concrete thinking  Content WDL  Delusions None reported or observed  Perception WDL  Hallucination None reported or observed  Judgment Limited  Confusion None  Danger to Self  Current suicidal ideation? Denies  Agreement Not to Harm Self Yes  Description of Agreement Verbal  Danger to Others  Danger to Others None reported or observed

## 2023-11-20 NOTE — [facility] Group Notes (Signed)
Pt did not attend goals group. 

## 2023-11-20 NOTE — Plan of Care (Signed)
   Problem: Education: Goal: Emotional status will improve Outcome: Progressing Goal: Mental status will improve Outcome: Progressing   Problem: Activity: Goal: Interest or engagement in activities will improve Outcome: Progressing Goal: Sleeping patterns will improve Outcome: Progressing

## 2023-11-20 NOTE — Progress Notes (Signed)
   11/20/23 1400  Psych Admission Type (Psych Patients Only)  Admission Status Involuntary  Psychosocial Assessment  Patient Complaints None  Eye Contact Fair  Facial Expression Flat  Affect Anxious  Speech Slow  Interaction Assertive  Motor Activity Slow  Appearance/Hygiene Unremarkable  Behavior Characteristics Cooperative;Elopement risk  Mood Anxious  Thought Process  Coherency WDL  Content WDL  Delusions None reported or observed  Perception WDL  Hallucination None reported or observed  Judgment Limited  Confusion None  Danger to Self  Current suicidal ideation? Denies  Danger to Others  Danger to Others None reported or observed   Pt noted checking exit door handles in hallway when walking to cafeteria. Pt redirected. Pt asking if she can leave today. MHT reported overhearing pt talking with female peer about potentially living with him post discharge.

## 2023-11-20 NOTE — [facility] Group Notes (Signed)
Pt did not attend wrap-up group   

## 2023-11-20 NOTE — Progress Notes (Signed)
   11/20/23 2015  Psych Admission Type (Psych Patients Only)  Admission Status Involuntary  Psychosocial Assessment  Patient Complaints None  Eye Contact Fair  Facial Expression Flat  Affect Anxious  Speech Slow  Interaction Assertive  Motor Activity Slow  Appearance/Hygiene Unremarkable  Behavior Characteristics Cooperative  Mood Anxious  Aggressive Behavior  Effect No apparent injury  Thought Process  Coherency WDL  Content WDL  Delusions WDL  Perception WDL  Hallucination None reported or observed  Judgment Limited  Confusion WDL  Danger to Self  Current suicidal ideation? Denies

## 2023-11-20 NOTE — [facility] Group Notes (Signed)
 Type of Therapy and Topic:  Group Therapy:  Boundaries  Participation Level:  Did Not Attend   Description of Group:  Patients in this group were introduced to 7 types of boundaries to address the extent to which we allow others into our lives, how we communicate, and where we draw the line when it comes to our personal space, emotions, and needs.  Different types of boundaries were defined and described, and each type was acted out.  Patients discussed what additional healthy boundaries could be helpful in their recovery and wellness after discharge in order to prevent future hospitalizations.   An emphasis was placed on following up with the discharge plan when they leave the hospital in order to continue becoming healthier and happier.  Two songs were played during group to help further patients' understanding.  Therapeutic Goals: 1)  demonstrate and identify the type of unhealthy and healthy boundaries  2)  discuss healthy boundaries and how to address it  3)  identify the patient's current level of healthy boundaries and   4)  elicit communicating healthy boundaries and trust    Summary of Patient Progress:  NA  Therapeutic Modalities:   Psychoeducation Brief Solution-Focused Therapy

## 2023-11-20 NOTE — [facility] Group Notes (Signed)
 Pt did not attend the group activity

## 2023-11-20 NOTE — Progress Notes (Signed)
 [facility] MD Progress Note  11/20/2023 9:46 AM Barbara Gordon  MRN:  [id number]  Reason for admission: 33 yr old female who presented on 12/27 to [facility] after a Suicide Attempt (OD- Seroquel , Gabapentin ) and bizarre behavior, she was admitted to [facility] on 12/31.  PPHx is significant for Schizophrenia, MDD, Borderline Personality Disorder, and a history of Polysubstance Abuse (Amphetamines, Opioids, THC), and Multiple Prior Suicide Attempts (OD) and Multiple Prior Psychiatric Hospitalizations (last- [facility] 11/2022).   24 hr chart review: Vitals within normal limits, patient slept 8 hours as per nursing reports. She remains compliant with medications. Has not attended any unit group sessions in the past 24 hrs, and is staying isolative to her room.  Daily notes:  Barbara Gordon is seen, chart reviewed. Patient continues to show improving mood since receiving her Invega  injectable 4 days ago. Her second dose of Invega  injectable is due to be administered today 11-20-23. During this evaluation, she reports I'm doing good. I'm looking forward to be discharged tomorrow. I met a guy here who has asked me to go home with him  as he has a friend who would allow us  both to stay with him. I wanted to consider that. Then, I realized that he is on parole & I'm on probation for being caught with methamphetamine someone gave me to sell. Barbara Gordon is showing improved mood, behavior & mannerism. She is remembering a lot more about her life. She is visible on the unit, attending group sessions. She  remains compliant with her recommended treatment regimen. She has received the initial dose of her invega  sustenna 4 days ago. After the administration of her Invega  156 mg dose today 11-20-23, she will continue this medicine Q 28 days from 11-20-23. Barbara Gordon continues to deny referral to an Act team which could have benefited her on the long run after discharge. She has expressed that she will not be going back to her friend, Barbara Gordon's home that he shares with  his mother because [name] has a bad temper & does not allow her to talk to anybody. She stated yesterday that she will rather go to a homeless shelter after discharge. She currently denies any SIHI, AVH, delusional thoughts or paranoia. She denies any side effects from her medications. See the current plan of care below as noted below. There are no changes made. Vital signs remain stable. [name] has been adamantly instructed & discouraged from going home with a female patient she met on this unit after discharge. She is instructed to rather continue with her original plan of going to a shelter where she will be assisted to apply for jobs including other assistance she may need.   Principal Problem: Disorganized schizophrenia, chronic condition (HCC)  Diagnosis: Principal Problem:   Disorganized schizophrenia, chronic condition (HCC)  Past Psychiatric History: See H&P.  Past Medical History:  Past Medical History:  Diagnosis Date   Hypothyroidism    Polysubstance abuse (HCC)    Schizophrenia (HCC)    History reviewed. No pertinent surgical history.  Family History: History reviewed. No pertinent family history.  Family Psychiatric  History: See H&P.  Social History:  Social History   Substance and Sexual Activity  Alcohol Use Not Currently   Comment: denies     Social History   Substance and Sexual Activity  Drug Use Not Currently   Types: Cocaine, Amphetamines, Methamphetamines, Marijuana   Comment: denies    Social History   Socioeconomic History   Marital status: Single    Spouse name: Not  on file   Number of children: Not on file   Years of education: Not on file   Highest education level: Not on file  Occupational History   Not on file  Tobacco Use   Smoking status: Some Days    Current packs/day: 1.00    Types: Cigarettes, E-cigarettes   Smokeless tobacco: Never  Vaping Use   Vaping status: Never Used  Substance and Sexual Activity   Alcohol use: Not Currently     Comment: denies   Drug use: Not Currently    Types: Cocaine, Amphetamines, Methamphetamines, Marijuana    Comment: denies   Sexual activity: Not Currently  Other Topics Concern   Not on file  Social History Narrative   Not on file   Social Drivers of Health   Financial Resource Strain: Low Risk  (11/20/2022)   Received from [facility]   Overall Financial Resource Strain (CARDIA)    Difficulty of Paying Living Expenses: Not very hard  Food Insecurity: Patient Unable To Answer (11/07/2023)   Hunger Vital Sign    Worried About Running Out of Food in the Last Year: Patient unable to answer    Ran Out of Food in the Last Year: Patient unable to answer  Transportation Needs: Patient Unable To Answer (11/07/2023)   PRAPARE - Transportation    Lack of Transportation (Medical): Patient unable to answer    Lack of Transportation (Non-Medical): Patient unable to answer  Recent Concern: Transportation Needs - Unmet Transportation Needs (11/06/2023)   PRAPARE - Transportation    Lack of Transportation (Medical): Yes    Lack of Transportation (Non-Medical): Yes  Physical Activity: Not on file  Stress: No Stress Concern Present (11/20/2022)   Received from [facility] of Occupational Health - Occupational Stress Questionnaire    Feeling of Stress : Only a little  Social Connections: Unknown (03/09/2022)   [facility]   Social Network    Social Network: Not on file   Additional Social History:   Sleep: Good  Appetite:  Good  Current Medications: Current Facility-Administered Medications  Medication Dose Route Frequency Provider Last Rate Last Admin   acetaminophen  (TYLENOL ) tablet 650 mg  650 mg Oral Q6H PRN Starkes-Perry, Takia S, FNP       alum & mag hydroxide-simeth (MAALOX/MYLANTA) 200-200-20 MG/5ML suspension 30 mL  30 mL Oral Q4H PRN Starkes-Perry, Takia S, FNP       cyanocobalamin  (VITAMIN B12) tablet 1,000 mcg  1,000 mcg  Oral Daily Parker, Alvin S, MD   1,000 mcg at 11/20/23 0803   haloperidol  (HALDOL ) tablet 5 mg  5 mg Oral TID PRN Johny Lot, MD   5 mg at 11/08/23 1426   And   diphenhydrAMINE  (BENADRYL ) capsule 50 mg  50 mg Oral TID PRN Johny Lot, MD   50 mg at 11/08/23 1426   haloperidol  lactate (HALDOL ) injection 5 mg  5 mg Intramuscular TID PRN Johny Lot, MD       And   diphenhydrAMINE  (BENADRYL ) injection 50 mg  50 mg Intramuscular TID PRN Massengill, Lot, MD       And   LORazepam  (ATIVAN ) injection 2 mg  2 mg Intramuscular TID PRN Johny Lot, MD       haloperidol  lactate (HALDOL ) injection 10 mg  10 mg Intramuscular TID PRN Massengill, Lot, MD       And   diphenhydrAMINE  (BENADRYL ) injection 50 mg  50 mg Intramuscular TID PRN Massengill, Lot,  MD       And   LORazepam  (ATIVAN ) injection 2 mg  2 mg Intramuscular TID PRN Massengill, Rankin, MD       FLUoxetine  (PROZAC ) capsule 20 mg  20 mg Oral Daily Nkwenti, Doris, NP   20 mg at 11/20/23 0802   gabapentin  (NEURONTIN ) capsule 300 mg  300 mg Oral QHS Starkes-Perry, Takia S, FNP   300 mg at 11/19/23 2141   hydrOXYzine  (ATARAX ) tablet 25 mg  25 mg Oral TID PRN Starkes-Perry, Takia S, FNP   25 mg at 11/16/23 1553   lisinopril  (ZESTRIL ) tablet 5 mg  5 mg Oral Daily Jezlyn Westerfield I, NP   5 mg at 11/20/23 9196   magnesium  hydroxide (MILK OF MAGNESIA) suspension 30 mL  30 mL Oral Daily PRN Starkes-Perry, Takia S, FNP       melatonin tablet 5 mg  5 mg Oral QHS Starkes-Perry, Takia S, FNP   5 mg at 11/19/23 2140   nicotine  (NICODERM CQ  - dosed in mg/24 hours) patch 14 mg  14 mg Transdermal Daily Wilkie Majel RAMAN, FNP   14 mg at 11/20/23 0802   ondansetron  (ZOFRAN -ODT) disintegrating tablet 4 mg  4 mg Oral Q6H PRN Collene Gouge I, NP   4 mg at 11/09/23 8165   paliperidone  (INVEGA  SUSTENNA) injection 156 mg  156 mg Intramuscular Q28 days Collene Gouge I, NP       QUEtiapine  (SEROQUEL ) tablet 50 mg  50 mg Oral QHS PRN  Pashayan, Alexander S, MD   50 mg at 11/19/23 2141   risperiDONE  (RISPERDAL ) tablet 4 mg  4 mg Oral QHS Nkwenti, Doris, NP   4 mg at 11/19/23 2140   Vitamin D  (Ergocalciferol ) (DRISDOL ) 1.25 MG (50000 UNIT) capsule 50,000 Units  50,000 Units Oral Q7 days Parker, Alvin S, MD   50,000 Units at 11/15/23 1247   vitamin D3 (CHOLECALCIFEROL ) tablet 2,000 Units  2,000 Units Oral Daily Parker, Alvin S, MD   2,000 Units at 11/20/23 0803   Lab Results:  No results found for this or any previous visit (from the past 48 hours).   Blood Alcohol level:  Lab Results  Component Value Date   ETH <10 11/04/2023   ETH <10 10/27/2023   Metabolic Disorder Labs: Lab Results  Component Value Date   HGBA1C 5.1 11/10/2023   MPG 100 11/10/2023   MPG 88.19 04/18/2022   Lab Results  Component Value Date   PROLACTIN 189.0 (H) 04/16/2022   PROLACTIN 2.6 (L) 01/15/2022   Lab Results  Component Value Date   CHOL 213 (H) 11/10/2023   TRIG 90 11/10/2023   HDL 52 11/10/2023   CHOLHDL 4.1 11/10/2023   VLDL 18 11/10/2023   LDLCALC 143 (H) 11/10/2023   LDLCALC 133 (H) 01/15/2022   Physical Findings: AIMS:0 CIWA:  na COWS:  n/a  Musculoskeletal: Strength & Muscle Tone: within normal limits Gait & Station: normal Patient leans: N/A  Psychiatric Specialty Exam:  Presentation  General Appearance:  Appropriate for Environment; Fairly Groomed  Eye Contact: Good  Speech: Clear and Coherent; Normal Rate  Speech Volume: Normal  Handedness: Right   Mood and Affect  Mood: -- (Improving)  Affect: Congruent  Thought Process  Thought Processes: Coherent; Goal Directed; Linear  Descriptions of Associations:Intact  Orientation:Full (Time, Place and Person)  Thought Content:-- (Less disorganized.)  History of Schizophrenia/Schizoaffective disorder:Yes  Duration of Psychotic Symptoms:Greater than six months  Hallucinations:Hallucinations: None   Ideas of Reference:None  Suicidal  Thoughts:Suicidal Thoughts: No  Homicidal Thoughts:Homicidal Thoughts: No  Sensorium  Memory: Immediate Good; Recent Good; Remote Fair  Judgment: Fair  Insight: Fair  Art Therapist  Concentration: Good  Attention Span: Good  Recall: Good  Fund of Knowledge: Fair  Language: Good  Psychomotor Activity  Psychomotor Activity: Psychomotor Activity: Normal  Assets  Assets: Communication Skills; Desire for Improvement; Physical Health; Resilience  Sleep  Sleep: Sleep: Good Number of Hours of Sleep: 8  Physical Exam: Physical Exam Vitals and nursing note reviewed.  HENT:     Nose: Nose normal.  Pulmonary:     Effort: Pulmonary effort is normal.  Genitourinary:    Comments: Deferred Musculoskeletal:        General: Normal range of motion.  Skin:    General: Skin is warm and dry.  Neurological:     General: No focal deficit present.     Mental Status: She is alert and oriented to person, place, and time.    Review of Systems  Constitutional:  Negative for chills and fever.  Gastrointestinal:  Negative for abdominal pain, constipation, diarrhea and nausea.  Musculoskeletal:  Negative for joint pain and myalgias.  Neurological:  Negative for loss of consciousness and headaches.  Psychiatric/Behavioral:  Positive for substance abuse. Negative for hallucinations and memory loss. The patient is nervous/anxious. The patient does not have insomnia.   All other systems reviewed and are negative.  Blood pressure 126/77, pulse 99, temperature 98.3 F (36.8 C), temperature source Oral, resp. rate 16, height 5' 1 (1.549 m), weight 54.4 kg, last menstrual period 11/04/2023, SpO2 99%. Body mass index is 22.67 kg/m.  Treatment Plan Summary: Daily contact with patient to assess and evaluate symptoms and progress in treatment and Medication management.   Continue inpatient hospitalization.  Will continue today 11/20/2023 plan as below except where it is  noted.   Schizophrenia: -Continue Prozac  20 mg daily for depressive symptoms on 1/8 -Give Invega  Sustenna 234 mg IM today 11/16/23 (Completed).  -Continue Invega  sustenna Q 28 days for mood  -Completed Invega  sustenna 156 mg IM on 11-20-23 for mood control. -Continue 4 mg po Q HS for Psychosis -Continue Vit D 50.000 units weekly for low Vit D levels  -Continue Gabapentin  300 mg TID for anxiety -Continue Agitation Protocol: Haldol /Ativan /Benadryl .  -Continue Melatonin 5 mg po q hs for insomnia.  -Continue Seroquel  50 mg po Q hs prn for insomnia. -Completed Lisinopril  10 mg po once today for HTN (11-11-23).  -Continue Lisinopril  5 mg po daily for HTN (Start 11-12-23).   Nicotine  Dependence: -Continue Nicotine  Patch 14 mg daily   -Continue Melatonin 5 mg QHS for Insomnia -Change Seroquel  50 mg to QHS PRN for Insomnia -Continue PRN's: Tylenol , Maalox, Atarax , Milk of Magnesia, Trazodone   Mac Bolster, NP, pmhnp, fnp-bc. 11/20/2023, 9:46 AM Patient ID: Royetta Sa, female   DOB: December 29, 1990, 33 y.o.   MRN: [id number]  Patient ID: Deshana Rominger, female   DOB: 16-Oct-1991, 33 y.o.   MRN: [id number] Patient ID: Kayleigh Broadwell, female   DOB: [birth year], 33 y.o.   MRN: [id number] Patient ID: Meka Lewan, female   DOB: 20-Feb-1991, 33 y.o.   MRN: [id number] Patient ID: Gweneth Fredlund, female   DOB: 29-Jan-1991, 33 y.o.   MRN: [id number] Patient ID: Airam Runions, female   DOB: 16-Feb-1991, 33 y.o.   MRN: [id number] Patient ID: Adanya Sosinski, female   DOB: 1990-12-12, 33 y.o.   MRN: [id number]

## 2023-11-21 DIAGNOSIS — F201 Disorganized schizophrenia: Secondary | ICD-10-CM | POA: Diagnosis not present

## 2023-11-21 MED ORDER — QUETIAPINE FUMARATE 50 MG PO TABS: 50.0000 mg | ORAL_TABLET | Freq: Every evening | ORAL | 0 refills | Status: AC | PRN

## 2023-11-21 MED ORDER — RISPERIDONE 4 MG PO TABS: 4.0000 mg | ORAL_TABLET | Freq: Every day | ORAL | 0 refills | Status: AC

## 2023-11-21 MED ORDER — ONDANSETRON 4 MG PO TBDP: 4.0000 mg | ORAL_TABLET | Freq: Four times a day (QID) | ORAL | 0 refills | Status: AC | PRN

## 2023-11-21 MED ORDER — HYDROXYZINE HCL 25 MG PO TABS: 25.0000 mg | ORAL_TABLET | Freq: Three times a day (TID) | ORAL | 0 refills | Status: AC | PRN

## 2023-11-21 MED ORDER — VITAMIN D (ERGOCALCIFEROL) 1.25 MG (50000 UNIT) PO CAPS: 50000.0000 [IU] | ORAL_CAPSULE | ORAL | 0 refills | Status: AC

## 2023-11-21 MED ORDER — VITAMIN D3 25 MCG PO TABS: 2000.0000 [IU] | ORAL_TABLET | Freq: Every day | ORAL | 0 refills | Status: AC

## 2023-11-21 MED ORDER — MELATONIN 5 MG PO TABS: 5.0000 mg | ORAL_TABLET | Freq: Every day | ORAL | 0 refills | Status: AC

## 2023-11-21 MED ORDER — PALIPERIDONE PALMITATE ER 156 MG/ML IM SUSY: 156.0000 mg | PREFILLED_SYRINGE | INTRAMUSCULAR | 0 refills | Status: AC

## 2023-11-21 MED ORDER — GABAPENTIN 100 MG PO CAPS: 300.0000 mg | ORAL_CAPSULE | Freq: Every day | ORAL | 0 refills | Status: AC

## 2023-11-21 MED ORDER — FLUOXETINE HCL 20 MG PO CAPS: 20.0000 mg | ORAL_CAPSULE | Freq: Every day | ORAL | 3 refills | Status: AC

## 2023-11-21 MED ORDER — NICOTINE 14 MG/24HR TD PT24: 14.0000 mg | MEDICATED_PATCH | Freq: Every day | TRANSDERMAL | 0 refills | Status: AC

## 2023-11-21 MED ORDER — CYANOCOBALAMIN 1000 MCG PO TABS: 1000.0000 ug | ORAL_TABLET | Freq: Every day | ORAL | 0 refills | Status: AC

## 2023-11-21 MED ORDER — LISINOPRIL 5 MG PO TABS: 5.0000 mg | ORAL_TABLET | Freq: Every day | ORAL | 0 refills | Status: AC

## 2023-11-21 NOTE — Transportation (Signed)
 11/21/2023  Barbara Gordon DOB: 1991/05/26 MRN: [id number]   RIDER WAIVER AND RELEASE OF LIABILITY  For the purposes of helping with transportation needs, [facility] partners with outside transportation providers (taxi companies, [place], catering manager.) to give Barbara Gordon patients or other approved people the choice of on-demand rides Public Librarian) to our buildings for non-emergency visits.  By using [employer], I, the person signing this document, on behalf of myself and/or any legal minors (in my care using the [employer]), agree:  Science Writer given to me are supplied by independent, outside transportation providers who do not work for, or have any affiliation with, [employer]. [place] is not a transportation company. [employer] has no control over the quality or safety of the rides I get using [employer]. [facility] has no control over whether any outside ride will happen on time or not. [employer] gives no guarantee on the reliability, quality, safety, or availability on any rides, or that no mistakes will happen. I know and accept that traveling by vehicle (car, truck, SVU, fleeta, bus, taxi, etc.) has risks of serious injuries such as disability, being paralyzed, and death. I know and agree the risk of using [employer] is mine alone, and not [employer]. [employer] are provided as is and as are available. The transportation providers are in charge for all inspections and care of the vehicles used to provide these rides. I agree not to take legal action against [employer], its agents, employees, officers, directors, representatives, insurers, attorneys, assigns, successors, subsidiaries, and affiliates at any time for any reasons related directly or indirectly to using [employer]. I also agree not to take legal action against  or its affiliates for any injury, death, or damage to property caused by or related to using  [employer]. I have read this Waiver and Release of Liability, and I understand the terms used in it and their legal meaning. This Waiver is freely and voluntarily given with the understanding that my right (or any legal minors) to legal action against  relating to [employer] is knowingly given up to use these services.   I attest that I read the Ride Waiver and Release of Liability to Barbara Gordon, gave Barbara Gordon the opportunity to ask questions and answered the questions asked (if any). I affirm that Barbara Gordon then provided consent for assistance with transportation.

## 2023-11-21 NOTE — Plan of Care (Signed)
  Problem: Education: Goal: Knowledge of [facility] Education information/materials will improve Outcome: Progressing Goal: Emotional status will improve Outcome: Progressing Goal: Mental status will improve Outcome: Progressing Goal: Verbalization of understanding the information provided will improve Outcome: Progressing   Problem: Activity: Goal: Interest or engagement in activities will improve Outcome: Progressing Goal: Sleeping patterns will improve Outcome: Progressing   Problem: Coping: Goal: Ability to verbalize frustrations and anger appropriately will improve Outcome: Progressing Goal: Ability to demonstrate self-control will improve Outcome: Progressing   Problem: Health Behavior/Discharge Planning: Goal: Identification of resources available to assist in meeting health care needs will improve Outcome: Progressing Goal: Compliance with treatment plan for underlying cause of condition will improve Outcome: Progressing   Problem: Physical Regulation: Goal: Ability to maintain clinical measurements within normal limits will improve Outcome: Progressing   Problem: Safety: Goal: Periods of time without injury will increase Outcome: Progressing   Problem: Education: Goal: Ability to make informed decisions regarding treatment will improve Outcome: Progressing   Problem: Coping: Goal: Coping ability will improve Outcome: Progressing   Problem: Health Behavior/Discharge Planning: Goal: Identification of resources available to assist in meeting health care needs will improve Outcome: Progressing   Problem: Medication: Goal: Compliance with prescribed medication regimen will improve Outcome: Progressing   Problem: Self-Concept: Goal: Ability to disclose and discuss suicidal ideas will improve Outcome: Progressing Goal: Will verbalize positive feelings about self Outcome: Progressing Note: Patient is on track. Patient will maintain adherence

## 2023-11-21 NOTE — Discharge Summary (Signed)
 Physician Discharge Summary Note  Patient:  Barbara Gordon is an 33 y.o., female MRN:  [id number] DOB:  October 19, 1991 Patient phone:  [phone] (home)  Patient address:   [address] [place] [address],  Total Time spent with patient: 30 minutes  Date of Admission:  11/07/2023 Date of Discharge:   11/21/2023  Reason for Admission:  Barbara Gordon is a 33 yr old female who presented on 12/27 to [facility] after a Suicide Attempt (OD- Seroquel , Gabapentin ) and bizarre behavior, she was admitted to [facility] on 12/31.  PPHx is significant for Schizophrenia, MDD, Borderline Personality Disorder, and a history of Polysubstance Abuse (Amphetamines, Opioids, THC), and Multiple Prior Suicide Attempts (OD) and Multiple Prior Psychiatric Hospitalizations (last- [facility] 11/2022).   Principal Problem: Disorganized schizophrenia, chronic condition [facility]) Discharge Diagnoses: Principal Problem:   Disorganized schizophrenia, chronic condition (HCC)  Past Psychiatric History:  Schizophrenia, MDD, Borderline Personality Disorder, and a history of Polysubstance Abuse (Amphetamines, Opioids, THC), and Multiple Prior Suicide Attempts (OD) and Multiple Prior Psychiatric Hospitalizations (last- [facility] 11/2022).   Past Medical History:  Past Medical History:  Diagnosis Date   Hypothyroidism    Polysubstance abuse (HCC)    Schizophrenia (HCC)    History reviewed. No pertinent surgical history. Family History: History reviewed. No pertinent family history. Family Psychiatric  History: Mother- Bipolar Disorder   Social History:  Social History   Substance and Sexual Activity  Alcohol Use Not Currently   Comment: denies     Social History   Substance and Sexual Activity  Drug Use Not Currently   Types: Cocaine, Amphetamines, Methamphetamines, Marijuana   Comment: denies    Social History   Socioeconomic History   Marital status: Single    Spouse name: Not on file   Number of children: Not on file    Years of education: Not on file   Highest education level: Not on file  Occupational History   Not on file  Tobacco Use   Smoking status: Some Days    Current packs/day: 1.00    Types: Cigarettes, E-cigarettes   Smokeless tobacco: Never  Vaping Use   Vaping status: Never Used  Substance and Sexual Activity   Alcohol use: Not Currently    Comment: denies   Drug use: Not Currently    Types: Cocaine, Amphetamines, Methamphetamines, Marijuana    Comment: denies   Sexual activity: Not Currently  Other Topics Concern   Not on file  Social History Narrative   Not on file   Social Drivers of Health   Financial Resource Strain: Low Risk  (11/20/2022)   Received from [facility]   Overall Financial Resource Strain (CARDIA)    Difficulty of Paying Living Expenses: Not very hard  Food Insecurity: Patient Unable To Answer (11/07/2023)   Hunger Vital Sign    Worried About Running Out of Food in the Last Year: Patient unable to answer    Ran Out of Food in the Last Year: Patient unable to answer  Transportation Needs: Patient Unable To Answer (11/07/2023)   PRAPARE - Transportation    Lack of Transportation (Medical): Patient unable to answer    Lack of Transportation (Non-Medical): Patient unable to answer  Recent Concern: Transportation Needs - Unmet Transportation Needs (11/06/2023)   PRAPARE - Transportation    Lack of Transportation (Medical): Yes    Lack of Transportation (Non-Medical): Yes  Physical Activity: Not on file  Stress: No Stress Concern Present (11/20/2022)   Received from [facility]   [place]  [employer] - Occupational Stress Questionnaire    Feeling of Stress : Only a little  Social Connections: Unknown (03/09/2022)   Received from [facility]   Social Network    Social Network: Not on file   Hospital Course:  During the patient's hospitalization, patient had extensive initial psychiatric evaluation, and follow-up  psychiatric evaluations every day.  Psychiatric diagnoses provided upon initial assessment: Disorganized schizophrenia, chronic condition (HCC)   Patient's psychiatric medications were adjusted on admission:  -Start Risperdal  1 mg QHS for Psychosis -Continue Gabapentin  300 mg TID for anxiety -Continue Agitation Protocol: Haldol /Ativan /Benadryl          During the hospitalization, other adjustments were made to the patient's psychiatric medication regimen:  Risperdal  was increased to 4 mg p.o. nightly for psychosis Patient was started on Invega  LAI 156 mg IM with first dose on 11/20/2023  Patient's care was discussed during the interdisciplinary team meeting every day during the hospitalization.  The patient denies having side effects to prescribed psychiatric medication.  Gradually, patient started adjusting to milieu. The patient was evaluated each day by a clinical provider to ascertain response to treatment. Improvement was noted by the patient's report of decreasing symptoms, improved sleep and appetite, affect, medication tolerance, behavior, and participation in unit programming.  Patient was asked each day to complete a self inventory noting mood, mental status, pain, new symptoms, anxiety and concerns.    Symptoms were reported as significantly decreased or resolved completely by discharge.   On day of discharge, the patient reports that their mood is stable. The patient denied having suicidal thoughts for more than 48 hours prior to discharge.  Patient denies having homicidal thoughts.  Patient denies having auditory hallucinations.  Patient denies any visual hallucinations or other symptoms of psychosis. The patient was motivated to continue taking medication with a goal of continued improvement in mental health.   The patient reports their target psychiatric symptoms of psychosis responded well to the psychiatric medications, and the patient reports overall benefit other psychiatric  hospitalization. Supportive psychotherapy was provided to the patient. The patient also participated in regular group therapy while hospitalized. Coping skills, problem solving as well as relaxation therapies were also part of the unit programming.  Labs were reviewed with the patient, and abnormal results were discussed with the patient.  The patient is able to verbalize their individual safety plan to this provider.  # It is recommended to the patient to continue psychiatric medications as prescribed, after discharge from the hospital.    # It is recommended to the patient to follow up with your outpatient psychiatric provider and PCP.  # It was discussed with the patient, the impact of alcohol, drugs, tobacco have been there overall psychiatric and medical wellbeing, and total abstinence from substance use was recommended the patient.ed.  # Prescriptions provided or sent directly to preferred pharmacy at discharge. Patient agreeable to plan. Given opportunity to ask questions. Appears to feel comfortable with discharge.    # In the event of worsening symptoms, the patient is instructed to call the crisis hotline, 911 and or go to the nearest ED for appropriate evaluation and treatment of symptoms. To follow-up with primary care provider for other medical issues, concerns and or health care needs  # Patient was discharged [facility] with a plan to follow up as noted below.  Physical Findings: AIMS:  , ,  ,  ,    CIWA:    COWS:  Musculoskeletal: Strength & Muscle Tone: within normal limits Gait & Station: normal Patient leans: N/A  Psychiatric Specialty Exam:  Presentation  General Appearance:  Appropriate for Environment; Casual; Fairly Groomed  Eye Contact: Good  Speech: Clear and Coherent; Normal Rate  Speech Volume: Normal  Handedness: Right  Mood and Affect  Mood: Euthymic  Affect: Congruent  Thought Process  Thought Processes: Coherent; Goal Directed;  Linear  Descriptions of Associations:Intact  Orientation:Full (Time, Place and Person)  Thought Content:Logical; WDL  History of Schizophrenia/Schizoaffective disorder:Yes  Duration of Psychotic Symptoms:Greater than six months  Hallucinations:Hallucinations: None  Ideas of Reference:None  Suicidal Thoughts:Suicidal Thoughts: No  Homicidal Thoughts:Homicidal Thoughts: No  Sensorium  Memory: Immediate Good; Recent Good  Judgment: Fair  Insight: Fair  Art Therapist  Concentration: Good  Attention Span: Good  Recall: Good  Fund of Knowledge: Fair  Language: Good  Psychomotor Activity  Psychomotor Activity: Psychomotor Activity: Normal  Assets  Assets: Communication Skills; Desire for Improvement; Physical Health; Resilience  Sleep  Sleep: Sleep: Good Number of Hours of Sleep: 7.75  Physical Exam: Physical Exam Vitals and nursing note reviewed.  Constitutional:      General: She is not in acute distress.    Appearance: Normal appearance. She is not toxic-appearing.  HENT:     Head: Normocephalic.     Nose: Nose normal.     Mouth/Throat:     Mouth: Mucous membranes are moist.     Pharynx: Oropharynx is clear.  Eyes:     Extraocular Movements: Extraocular movements intact.  Cardiovascular:     Rate and Rhythm: Normal rate.     Pulses: Normal pulses.  Pulmonary:     Effort: Pulmonary effort is normal.  Abdominal:     Comments: Deferred  Genitourinary:    Comments: Deferred Musculoskeletal:        General: Normal range of motion.     Cervical back: Normal range of motion.  Skin:    General: Skin is warm.  Neurological:     General: No focal deficit present.     Mental Status: She is alert and oriented to person, place, and time. Mental status is at baseline.     Motor: No weakness.     Gait: Gait normal.  Psychiatric:        Mood and Affect: Mood normal.        Behavior: Behavior normal.        Thought Content: Thought  content normal.    Review of Systems  Constitutional:  Negative for chills and fever.  HENT:  Negative for sore throat.   Eyes:  Negative for blurred vision.  Respiratory:  Negative for cough, sputum production, shortness of breath and wheezing.   Cardiovascular:  Negative for chest pain and palpitations.  Gastrointestinal:  Negative for abdominal pain, constipation, diarrhea, heartburn, nausea and vomiting.  Genitourinary:  Negative for dysuria, frequency and urgency.  Musculoskeletal:  Negative for falls.  Skin:  Negative for itching and rash.  Neurological:  Negative for dizziness, tingling and headaches.  Endo/Heme/Allergies:        See allergy listing  Psychiatric/Behavioral:  Positive for depression (Stable with medication). Negative for hallucinations, substance abuse and suicidal ideas. The patient is nervous/anxious (Improved with medication). The patient does not have insomnia (Improved with medication).    Blood pressure 122/75, pulse 96, temperature 98.2 F (36.8 C), temperature source Oral, resp. rate 16, height 5' 1 (1.549 m), weight 54.4 kg, last menstrual period 11/04/2023, SpO2 98%. Body mass  index is 22.67 kg/m.  Social History   Tobacco Use  Smoking Status Some Days   Current packs/day: 1.00   Types: Cigarettes, E-cigarettes  Smokeless Tobacco Never   Tobacco Cessation:  A prescription for an FDA-approved tobacco cessation medication provided at discharge  Blood Alcohol level:  Lab Results  Component Value Date   [facility] <10 11/04/2023   ETH <10 10/27/2023   Metabolic Disorder Labs:  Lab Results  Component Value Date   HGBA1C 5.1 11/10/2023   MPG 100 11/10/2023   MPG 88.19 04/18/2022   Lab Results  Component Value Date   PROLACTIN 189.0 (H) 04/16/2022   PROLACTIN 2.6 (L) 01/15/2022   Lab Results  Component Value Date   CHOL 213 (H) 11/10/2023   TRIG 90 11/10/2023   HDL 52 11/10/2023   CHOLHDL 4.1 11/10/2023   VLDL 18 11/10/2023   LDLCALC 143  (H) 11/10/2023   LDLCALC 133 (H) 01/15/2022   See Psychiatric Specialty Exam and Suicide Risk Assessment completed by Attending Physician prior to discharge.  Discharge destination:  Other:  [facility]  Is patient on multiple antipsychotic therapies at discharge:  Yes,   Do you recommend tapering to monotherapy for antipsychotics?  Yes   Has Patient had three or more failed trials of antipsychotic monotherapy by history:  Yes,   Antipsychotic medications that previously failed include:   1.  Risperdal . and 2.  Seroquel .  Recommended Plan for Multiple Antipsychotic Therapies: Patient's medications are in the process of a cross-taper;  medications include:  Invega  LAI  Discharge Instructions     Increase activity slowly   Complete by: As directed       Allergies as of 11/21/2023       Reactions   Latex Itching, Rash   Tramadol Itching, Other (See Comments)   Provider: Nana Carrier CFM - Allergy Description: TraMADol HCl *ANALGESICS - OPIOID* CFM - Allergy Annotation: Pruritus.        Medication List     STOP taking these medications    OLANZapine  5 MG tablet Commonly known as: ZYPREXA        TAKE these medications      Indication  cyanocobalamin  1000 MCG tablet Take 1 tablet (1,000 mcg total) by mouth daily. Start taking on: November 22, 2023  Indication: Inadequate Vitamin B12   FLUoxetine  20 MG capsule Commonly known as: PROZAC  Take 1 capsule (20 mg total) by mouth daily. Start taking on: November 22, 2023  Indication: Major Depressive Disorder   gabapentin  100 MG capsule Commonly known as: NEURONTIN  Take 3 capsules (300 mg total) by mouth at bedtime.  Indication: Generalized Anxiety Disorder   hydrOXYzine  25 MG tablet Commonly known as: ATARAX  Take 1 tablet (25 mg total) by mouth 3 (three) times daily as needed for anxiety.  Indication: Feeling Anxious   lisinopril  5 MG tablet Commonly known as: ZESTRIL  Take 1 tablet (5 mg total) by mouth daily. Start  taking on: November 22, 2023  Indication: High Blood Pressure   melatonin 5 MG Tabs Take 1 tablet (5 mg total) by mouth at bedtime.  Indication: Trouble Sleeping   nicotine  14 mg/24hr patch Commonly known as: NICODERM CQ  - dosed in mg/24 hours Place 1 patch (14 mg total) onto the skin daily. Start taking on: November 22, 2023  Indication: Nicotine  Addiction   ondansetron  4 MG disintegrating tablet Commonly known as: ZOFRAN -ODT Take 1 tablet (4 mg total) by mouth every 6 (six) hours as needed for nausea or vomiting.  Indication: Nausea  and Vomiting   paliperidone  156 MG/ML Susy injection Commonly known as: INVEGA  SUSTENNA Inject 1 mL (156 mg total) into the muscle every 28 (twenty-eight) days. Start taking on: December 18, 2023  Indication: Schizophrenia, Mood control   QUEtiapine  50 MG tablet Commonly known as: SEROQUEL  Take 1 tablet (50 mg total) by mouth at bedtime as needed (insomnia). What changed:  when to take this reasons to take this  Indication: Trouble Sleeping   risperidone  4 MG tablet Commonly known as: RISPERDAL  Take 1 tablet (4 mg total) by mouth at bedtime.  Indication: Schizophrenia, Mood control   Vitamin D  (Ergocalciferol ) 1.25 MG (50000 UNIT) Caps capsule Commonly known as: DRISDOL  Take 1 capsule (50,000 Units total) by mouth every 7 (seven) days. Start taking on: November 22, 2023  Indication: Vitamin D  Deficiency   vitamin D3 25 MCG tablet Commonly known as: CHOLECALCIFEROL  Take 2 tablets (2,000 Units total) by mouth daily. Start taking on: November 22, 2023  Indication: Vitamin D  Deficiency        Follow-up Information     [facility]. Go to.   Specialty: Professional Counselor Why: Please go to this provider for an assessment, to obtain therapy services; Monday through Friday, between 9 am and 1 pm. Contact information: [address]  [address] [place] [address] [phone]         [facility]. Go to.   Specialty: Behavioral Health Why: Please go to this provider for an assessment, to obtain therapy and medication management services; Monday through Friday, arrive by 7:00 am for same day service. Contact information: [address] [place]  [phone]        Social Security Disability Insurance (SSDI) Follow up.   Why: You can apply for Disability benefits online, or if you are unable to complete the application online, you can apply by calling our toll-free number, [phone], between 8:00 a.m. and 7:00 p.m. Our representatives can make an appointment for you to apply Contact information: [phone], between 8:00 a.m. and 7:00 p.m               Follow-up recommendations:   Discharge Recommendations:  The patient is being discharged with her family. Patient is to take her discharge medications as ordered.  See follow up above. We recommend that she participates in individual therapy to target uncontrollable agitation and substance abuse.  We recommend that she participates in therapy to target the conflict with her family, to improve communication skills and conflict resolution skills.  patient is to initiate/implement a contingency based behavioral model to address patient's behavior. We recommend that she gets AIMS scale, height, weight, blood pressure, fasting lipid panel, fasting blood sugar in three months from discharge if she's on atypical antipsychotics.  Patient will benefit from monitoring of recurrent suicidal ideation since patient is on antidepressant medication. The patient should abstain from all illicit substances and alcohol. If the patient's symptoms worsen or do not continue to improve or if the patient becomes actively suicidal or homicidal then it is recommended that the patient return to the closest hospital emergency room or call 911 for further evaluation and treatment. National Suicide  Prevention Lifeline 1800-SUICIDE or 352-252-4734. Please follow up with your primary medical doctor for all other medical needs.  The patient has been educated on the possible side effects to medications and she/her guardian is to contact a medical professional and inform outpatient provider of any new side effects of medication. She is to  take regular diet and activity as tolerated.  Will benefit from moderate daily exercise. Patient was educated about removing/locking any firearms, medications or dangerous products from the home.  Activity:  As tolerated Diet:  Regular Diet  Signed: Ellouise JAYSON Azure, FNP 11/21/2023, 11:15 AM
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
## 2023-11-21 NOTE — Progress Notes (Signed)
  [facility] Adult Case Management Discharge Plan :  Will you be returning to the same living situation after discharge:  No. Pt has requested to be discharged to the [facility], per pt's request. At discharge, do you have transportation home?: Yes,  pt will be transported via [employer] taxi Do you have the ability to pay for your medications: Yes,  pt has active medical coverage  Release of information consent forms completed and in the chart;  Patient's signature needed at discharge.  Patient to Follow up at:  Follow-up Information     [facility]. Go to.   Specialty: Professional Counselor Why: Please go to this provider for an assessment, to obtain therapy services; Monday through Friday, between 9 am and 1 pm. Contact information: [address]  [address] [place] [address] [phone]         [facility]. Go to.   Specialty: Behavioral Health Why: Please go to this provider for an assessment, to obtain therapy and medication management services; Monday through Friday, arrive by 7:00 am for same day service. Contact information: [address] [place]  [phone]        Social Security Disability Insurance (SSDI) Follow up.   Why: You can apply for Disability benefits online, or if you are unable to complete the application online, you can apply by calling our toll-free number, [phone], between 8:00 a.m. and 7:00 p.m. Our representatives can make an appointment for you to apply Contact information: [phone], between 8:00 a.m. and 7:00 p.m                Next level of care provider has access to [facility] Link:yes  Safety Planning and Suicide Prevention discussed: Yes,  Safety planning with [name]  Magdich,(Friend) [phone]     Has patient been referred to the Quitline?: Patient refused referral for treatment  Patient has been referred for addiction treatment: Patient refused  referral for treatment.  Oliveah Zwack, Donia SAUNDERS, LCSWA 11/21/2023, 9:56 AM

## 2023-11-21 NOTE — Progress Notes (Signed)
 Pt discharged today. Pt verbalized understanding of medications and discharge instructions. Pt denies SI/HI/AVH. Pt left facility via taxi.

## 2023-11-21 NOTE — BHH Suicide Risk Assessment (Signed)
 [facility] Discharge Suicide Risk Assessment   Principal Problem: Disorganized schizophrenia, chronic condition (HCC) Discharge Diagnoses: Principal Problem:   Disorganized schizophrenia, chronic condition (HCC)   Total Time spent with patient: 30 minutes  Musculoskeletal: Strength & Muscle Tone: within normal limits Gait & Station: normal Patient leans: N/A  Psychiatric Specialty Exam  Presentation  General Appearance:  Appropriate for Environment; Fairly Groomed  Eye Contact: Good  Speech: Clear and Coherent; Normal Rate  Speech Volume: Normal  Handedness: Right   Mood and Affect  Mood: -- (Improving)  Duration of Depression Symptoms: Greater than two weeks  Affect: Congruent   Thought Process  Thought Processes: Coherent; Goal Directed; Linear  Descriptions of Associations:Intact  Orientation:Full (Time, Place and Person)  Thought Content:-- (Less disorganized.)  History of Schizophrenia/Schizoaffective disorder:Yes  Duration of Psychotic Symptoms:Greater than six months  Hallucinations:No data recorded Ideas of Reference:None  Suicidal Thoughts:No data recorded Homicidal Thoughts:No data recorded  Sensorium  Memory: Immediate Good; Recent Good; Remote Fair  Judgment: Fair  Insight: Fair   Art Therapist  Concentration: Good  Attention Span: Good  Recall: Good  Fund of Knowledge: Fair  Language: Good   Psychomotor Activity  Psychomotor Activity:No data recorded  Assets  Assets: Communication Skills; Desire for Improvement; Physical Health; Resilience   Sleep  Sleep:No data recorded  Physical Exam: Physical Exam ROS Blood pressure 122/75, pulse 96, temperature 98.2 F (36.8 C), temperature source Oral, resp. rate 16, height 5' 1 (1.549 m), weight 54.4 kg, last menstrual period 11/04/2023, SpO2 98%. Body mass index is 22.67 kg/m.  Mental Status Per Nursing Assessment::   On Admission:  NA  Demographic  Factors:  Caucasian and Unemployed  Loss Factors: NA  Historical Factors: Prior suicide attempts and Impulsivity  Risk Reduction Factors:   Responsible for children under 37 years of age and Positive therapeutic relationship  Continued Clinical Symptoms:  Patient presented with an overdose, acute confusional state.  She was manage medical well before she was transferred to our unit.  She exhibited marked psychotic symptoms and presentation.  She has responded well to an antipsychotic medication.  She is not longer acting injectable.  Her psychosis has completely resolved.  She is able to think clearly and articulate her thoughts and feelings.  No external interference with her thought process.  No hallucinations.  No delusions.  No cravings for any psychoactive substance.  Cognitive Features That Contribute To Risk:  None    Suicide Risk:  Minimal: Patient has responded well to her treatment.  She does not have any current suicidal thoughts.  She does not have any current homicidal thoughts.  Patient does not have any thoughts of violence.  She has some level of support in the community but has opted to go to a shelter. At this point in time, there is no risk to self or to others.  Patient is stable for discharge to a lower level of care.  Follow-up Information     [facility]. Go to.   Specialty: Professional Counselor Why: Please go to this provider for an assessment, to obtain therapy services; Monday through Friday, between 9 am and 1 pm. Contact information: [address]  [address] [place] [address] [phone]         [facility]. Go to.   Specialty: Behavioral Health Why: Please go to this provider for an assessment, to obtain therapy and medication management services; Monday through Friday, arrive by 7:00 am for same day service. Contact  information: [address] [place]  [phone]         Social Security Disability Insurance (SSDI) Follow up.   Why: You can apply for Disability benefits online, or if you are unable to complete the application online, you can apply by calling our toll-free number, [phone], between 8:00 a.m. and 7:00 p.m. Our representatives can make an appointment for you to apply Contact information: [phone], between 8:00 a.m. and 7:00 p.m                Plan Of Care/Follow-up recommendations:  No dietary or physical activity restriction.  Please see discharge summary for follow-up arrangements.  Jerrell DELENA Forehand, MD 11/21/2023, 10:37 AM

## 2023-11-21 NOTE — Group Note (Signed)
 Recreation Therapy Group Note   Group Topic:Stress Management  Group Date: 11/21/2023 Start Time: 0935 End Time: 0955 Facilitators: Christon Parada-McCall, LRT,CTRS Location: [address] Day[address]   Group Topic: Stress Management  Goal Area(s) Addresses:  Patient will identify positive stress management techniques. Patient will identify benefits of using stress management post d/c.  Intervention: Insight Timer App  Activity: Meditation. LRT played a meditation that focused on having a mindful day. Patients were encouraged to follow along with the meditation and focus on their breathing. LRT shared with patients they could find other meditations on different apps, scripts, internet, etc.    Education:  Stress Management, Discharge Planning.   Education Outcome: Acknowledges Education   Affect/Mood: Appropriate   Participation Level: Minimal   Participation Quality: Independent   Behavior: Disruptive   Speech/Thought Process: Distracted   Insight: Poor   Judgement: Poor   Modes of Intervention: App   Patient Response to Interventions:  Disengaged   Education Outcome:  In group clarification offered    Clinical Observations/Individualized Feedback: Pt was late to group. Pt came into group and started talking to peer distracting him. Pt had to redirected. Pt left and came back a few minutes later exhibiting the same behavior. Pt left again and did not return.     Plan: Continue to engage patient in RT group sessions 2-3x/week.   Apolonia Ellwood-McCall, LRT,CTRS 11/21/2023 12:05 PM

## 2023-11-22 ENCOUNTER — Emergency Department (HOSPITAL_BASED_OUTPATIENT_CLINIC_OR_DEPARTMENT_OTHER)
Admission: EM | Admit: 2023-11-22 | Discharge: 2023-11-22 | Discharge status: Against Medical Advice | Payer: MEDICAID | Source: Home / Self Care

## 2023-12-22 ENCOUNTER — Ambulatory Visit: Payer: MEDICAID | Admitting: Infectious Diseases

## 2023-12-23 ENCOUNTER — Telehealth: Payer: Self-pay | Admitting: Pharmacist

## 2023-12-23 NOTE — Telephone Encounter (Signed)
Patient missed appointment with Judeth Cornfield this week for HCV follow-up. Can you please reach out to patient for follow-up with pharmacy or Judeth Cornfield to discus completion of HCV therapy?  Margarite Gouge, PharmD, CPP, BCIDP, AAHIVP Clinical Pharmacist Practitioner Infectious Diseases Clinical Pharmacist [facility]

## 2023-12-26 NOTE — Telephone Encounter (Signed)
 Thank you :)

## 2024-01-09 ENCOUNTER — Telehealth: Payer: Self-pay

## 2024-01-09 NOTE — Telephone Encounter (Signed)
 Left patient to call back to reschedule missed appointment with Rexene Alberts, ok to with pharmacy or Judeth Cornfield to discus completion of HCV therapy.

## 2024-07-06 ENCOUNTER — Other Ambulatory Visit: Payer: Self-pay

## 2024-07-06 ENCOUNTER — Emergency Department (HOSPITAL_COMMUNITY)
Admission: EM | Admit: 2024-07-06 | Discharge: 2024-07-06 | Discharge status: Against Medical Advice | Payer: MEDICAID | Attending: Emergency Medicine | Admitting: Emergency Medicine

## 2024-07-06 DIAGNOSIS — Z5329 Procedure and treatment not carried out because of patient's decision for other reasons: Secondary | ICD-10-CM | POA: Insufficient documentation

## 2024-07-06 DIAGNOSIS — Z91040 Latex allergy status: Secondary | ICD-10-CM | POA: Diagnosis not present

## 2024-07-06 DIAGNOSIS — F1910 Other psychoactive substance abuse, uncomplicated: Secondary | ICD-10-CM | POA: Insufficient documentation

## 2024-07-06 DIAGNOSIS — F419 Anxiety disorder, unspecified: Secondary | ICD-10-CM | POA: Insufficient documentation

## 2024-07-06 LAB — CBC
HCT: 45.4 % (ref 36.0–46.0)
Hemoglobin: 15.4 g/dL — ABNORMAL HIGH (ref 12.0–15.0)
MCH: 30.7 pg (ref 26.0–34.0)
MCHC: 33.9 g/dL (ref 30.0–36.0)
MCV: 90.4 fL (ref 80.0–100.0)
Platelets: 336 K/uL (ref 150–400)
RBC: 5.02 MIL/uL (ref 3.87–5.11)
RDW: 12.6 % (ref 11.5–15.5)
WBC: 13.1 K/uL — ABNORMAL HIGH (ref 4.0–10.5)
nRBC: 0 % (ref 0.0–0.2)

## 2024-07-06 LAB — COMPREHENSIVE METABOLIC PANEL WITH GFR
ALT: 14 U/L (ref 0–44)
AST: 24 U/L (ref 15–41)
Albumin: 5.4 g/dL — ABNORMAL HIGH (ref 3.5–5.0)
Alkaline Phosphatase: 71 U/L (ref 38–126)
Anion gap: 17 — ABNORMAL HIGH (ref 5–15)
BUN: 8 mg/dL (ref 6–20)
CO2: 19 mmol/L — ABNORMAL LOW (ref 22–32)
Calcium: 10.6 mg/dL — ABNORMAL HIGH (ref 8.9–10.3)
Chloride: 106 mmol/L (ref 98–111)
Creatinine, Ser: 0.81 mg/dL (ref 0.44–1.00)
GFR, Estimated: >60 mL/min (ref >60)
Glucose, Bld: 164 mg/dL — ABNORMAL HIGH (ref 70–99)
Potassium: 4.4 mmol/L (ref 3.5–5.1)
Sodium: 142 mmol/L (ref 135–145)
Total Bilirubin: 0.4 mg/dL (ref 0.0–1.2)
Total Protein: 9 g/dL — ABNORMAL HIGH (ref 6.5–8.1)

## 2024-07-06 LAB — ETHANOL: Alcohol, Ethyl (B): <15 mg/dL (ref <15)

## 2024-07-06 LAB — HCG, SERUM, QUALITATIVE: Preg, Serum: NEGATIVE

## 2024-07-06 MED ORDER — ONDANSETRON 4 MG PO TBDP
4.0000 mg | ORAL_TABLET | Freq: Once | ORAL | Status: AC
  Administered 2024-07-06: 4 mg via ORAL
  Filled 2024-07-06: qty 1

## 2024-07-06 NOTE — ED Notes (Signed)
 Pt drinking water from the sink.  I have ask her to stop until we can get her nausea under control but she continues to do it anyway.

## 2024-07-06 NOTE — ED Notes (Signed)
 Pt sitting in room eating a biscuit

## 2024-07-06 NOTE — ED Provider Notes (Signed)
 [facility] AT [facility] Provider Note   CSN: [id number] Arrival date & time: 07/06/24  1105     Patient presents with: Anxiety   Barbara Gordon is a 33 y.o. female.   Patient complains of anxiety.  Patient states that she was so anxious at home that she snorted gabapentin  to see if this would help.  Patient reported vomiting after snorting the gabapentin .  Patient reports she continues to have nausea here.  Patient denies taking any other substance.  Patient reports having problems with anxiety.  Patient has a past medical history of substance abuse including amphetamine and cocaine.  Patient has a psychiatric history for schizophrenia and borderline personality disorder.  Patient is requesting medicine to help with the nausea.  Patient is requesting help with anxiety.  She is not currently experiencing any thoughts of self-harm or thoughts of harming anyone else.  The history is provided by the patient. No language interpreter was used.  Anxiety       Prior to Admission medications   Medication Sig Start Date End Date Taking? Authorizing Provider  cyanocobalamin  1000 MCG tablet Take 1 tablet (1,000 mcg total) by mouth daily. 11/22/23   Ntuen, Tina C, FNP  FLUoxetine  (PROZAC ) 20 MG capsule Take 1 capsule (20 mg total) by mouth daily. 11/22/23   Ntuen, Tina C, FNP  gabapentin  (NEURONTIN ) 100 MG capsule Take 3 capsules (300 mg total) by mouth at bedtime. 11/21/23   Ntuen, Tina C, FNP  hydrOXYzine  (ATARAX ) 25 MG tablet Take 1 tablet (25 mg total) by mouth 3 (three) times daily as needed for anxiety. 11/21/23   Ntuen, Tina C, FNP  lisinopril  (ZESTRIL ) 5 MG tablet Take 1 tablet (5 mg total) by mouth daily. 11/22/23   Ntuen, Tina C, FNP  melatonin 5 MG TABS Take 1 tablet (5 mg total) by mouth at bedtime. 11/21/23   Ntuen, Tina C, FNP  nicotine  (NICODERM CQ  - DOSED IN MG/24 HOURS) 14 mg/24hr patch Place 1 patch (14 mg total) onto the skin daily. 11/22/23   Ntuen,  Tina C, FNP  ondansetron  (ZOFRAN -ODT) 4 MG disintegrating tablet Take 1 tablet (4 mg total) by mouth every 6 (six) hours as needed for nausea or vomiting. 11/21/23   Ntuen, Tina C, FNP  paliperidone  (INVEGA  SUSTENNA) 156 MG/ML SUSY injection Inject 1 mL (156 mg total) into the muscle every 28 (twenty-eight) days. 12/18/23   Ntuen, Tina C, FNP  QUEtiapine  (SEROQUEL ) 50 MG tablet Take 1 tablet (50 mg total) by mouth at bedtime as needed (insomnia). 11/21/23   Ntuen, Tina C, FNP  risperiDONE  (RISPERDAL ) 4 MG tablet Take 1 tablet (4 mg total) by mouth at bedtime. 11/21/23   Ntuen, Tina C, FNP  Vitamin D , Ergocalciferol , (DRISDOL ) 1.25 MG (50000 UNIT) CAPS capsule Take 1 capsule (50,000 Units total) by mouth every 7 (seven) days. 11/22/23   Ntuen, Tina C, FNP  vitamin D3 (CHOLECALCIFEROL ) 25 MCG tablet Take 2 tablets (2,000 Units total) by mouth daily. 11/22/23   Ntuen, Tina C, FNP    Allergies: Latex and Tramadol    Review of Systems  All other systems reviewed and are negative.   Updated Vital Signs BP 113/70   Pulse 78   Temp 98 F (36.7 C)   Resp 16   SpO2 100%   Physical Exam Vitals and nursing note reviewed.  Constitutional:      Appearance: She is well-developed.  HENT:     Head: Normocephalic.  Nose: Nose normal.     Mouth/Throat:     Mouth: Mucous membranes are moist.  Eyes:     Extraocular Movements: Extraocular movements intact.     Pupils: Pupils are equal, round, and reactive to light.  Cardiovascular:     Rate and Rhythm: Normal rate.  Pulmonary:     Effort: Pulmonary effort is normal.  Abdominal:     General: There is no distension.  Musculoskeletal:        General: Normal range of motion.     Cervical back: Normal range of motion.  Skin:    General: Skin is warm.  Neurological:     General: No focal deficit present.     Mental Status: She is alert and oriented to person, place, and time.     (all labs ordered are listed, but only abnormal results are  displayed) Labs Reviewed  COMPREHENSIVE METABOLIC PANEL WITH GFR - Abnormal; Notable for the following components:      Result Value   CO2 19 (*)    Glucose, Bld 164 (*)    Calcium 10.6 (*)    Total Protein 9.0 (*)    Albumin 5.4 (*)    Anion gap 17 (*)    All other components within normal limits  CBC - Abnormal; Notable for the following components:   WBC 13.1 (*)    Hemoglobin 15.4 (*)    All other components within normal limits  ETHANOL  HCG, SERUM, QUALITATIVE  URINE DRUG SCREEN    EKG: None  Radiology: No results found.   Procedures   Medications Ordered in the ED  ondansetron  (ZOFRAN -ODT) disintegrating tablet 4 mg (4 mg Oral Given 07/06/24 1244)                                    Medical Decision Making Amount and/or Complexity of Data Reviewed Labs: ordered. Decision-making details documented in ED Course.  Risk Prescription drug management. Risk Details: Patient given ODT Zofran .  She is able to tolerate fluids.  Plan is medical screening labs and TTS assessment. Prior to labs returning and TTS assessment staff informed me that patient had left the department.  Patient is not suicidal or homicidal.  Patient does not meet criteria for IVC.        Final diagnoses:  Anxiety  Substance abuse [facility])    ED Discharge Orders     None          Flint Sonny POUR, PA-C 07/06/24 1715    Geraldene Hamilton, MD 07/08/24 1120

## 2024-07-06 NOTE — ED Triage Notes (Signed)
 Patient BIB EMS c/o anxiety and snorted 100mg  of gabapentin . Patient stated I snorted gabapentin  to me feel better. Patient keep making herself threw up at triage. Patient denies SI/HI

## 2024-07-06 NOTE — ED Notes (Signed)
 Pt sticking her fingers down her throat to induce vomiting. Pt has been asked to stop by EMS and triage staff

## 2024-07-31 ENCOUNTER — Ambulatory Visit: Payer: MEDICAID | Admitting: Internal Medicine

## 2024-08-07 ENCOUNTER — Other Ambulatory Visit: Payer: Self-pay

## 2024-08-07 ENCOUNTER — Other Ambulatory Visit (HOSPITAL_BASED_OUTPATIENT_CLINIC_OR_DEPARTMENT_OTHER): Payer: Self-pay

## 2024-08-07 ENCOUNTER — Emergency Department (HOSPITAL_BASED_OUTPATIENT_CLINIC_OR_DEPARTMENT_OTHER)
Admission: EM | Admit: 2024-08-07 | Discharge: 2024-08-07 | Disposition: A | Discharge status: Home / Self Care | Payer: MEDICAID | Attending: Emergency Medicine | Admitting: Emergency Medicine

## 2024-08-07 DIAGNOSIS — Z91040 Latex allergy status: Secondary | ICD-10-CM | POA: Insufficient documentation

## 2024-08-07 DIAGNOSIS — R1909 Other intra-abdominal and pelvic swelling, mass and lump: Secondary | ICD-10-CM | POA: Diagnosis present

## 2024-08-07 DIAGNOSIS — Z113 Encounter for screening for infections with a predominantly sexual mode of transmission: Secondary | ICD-10-CM | POA: Diagnosis not present

## 2024-08-07 DIAGNOSIS — N7689 Other specified inflammation of vagina and vulva: Secondary | ICD-10-CM | POA: Diagnosis not present

## 2024-08-07 DIAGNOSIS — A5901 Trichomonal vulvovaginitis: Secondary | ICD-10-CM

## 2024-08-07 DIAGNOSIS — E039 Hypothyroidism, unspecified: Secondary | ICD-10-CM | POA: Insufficient documentation

## 2024-08-07 DIAGNOSIS — N9089 Other specified noninflammatory disorders of vulva and perineum: Secondary | ICD-10-CM

## 2024-08-07 LAB — URINALYSIS, ROUTINE W REFLEX MICROSCOPIC
Bilirubin Urine: NEGATIVE
Glucose, UA: NEGATIVE mg/dL
Hgb urine dipstick: NEGATIVE
Ketones, ur: NEGATIVE mg/dL
Leukocytes,Ua: NEGATIVE
Nitrite: NEGATIVE
Protein, ur: NEGATIVE mg/dL
Specific Gravity, Urine: 1.016 (ref 1.005–1.030)
pH: 6 (ref 5.0–8.0)

## 2024-08-07 LAB — WET PREP, GENITAL
Sperm: NONE SEEN
WBC, Wet Prep HPF POC: <10 (ref <10)
Yeast Wet Prep HPF POC: NONE SEEN

## 2024-08-07 LAB — PREGNANCY, URINE: Preg Test, Ur: NEGATIVE

## 2024-08-07 MED ORDER — NAPROXEN 250 MG PO TABS
500.0000 mg | ORAL_TABLET | Freq: Once | ORAL | Status: AC
  Administered 2024-08-07: 500 mg via ORAL
  Filled 2024-08-07: qty 2

## 2024-08-07 MED ORDER — DOXYCYCLINE HYCLATE 100 MG PO CAPS
100.0000 mg | ORAL_CAPSULE | Freq: Two times a day (BID) | ORAL | 0 refills | Status: AC
  Filled 2024-08-07: qty 14, 7d supply, fill #0

## 2024-08-07 MED ORDER — CEFTRIAXONE SODIUM 500 MG IJ SOLR
500.0000 mg | Freq: Once | INTRAMUSCULAR | Status: AC
  Administered 2024-08-07: 500 mg via INTRAMUSCULAR

## 2024-08-07 MED ORDER — DOXYCYCLINE HYCLATE 100 MG PO TABS
100.0000 mg | ORAL_TABLET | Freq: Once | ORAL | Status: AC
  Administered 2024-08-07: 100 mg via ORAL
  Filled 2024-08-07: qty 1

## 2024-08-07 MED ORDER — METRONIDAZOLE 500 MG PO TABS
500.0000 mg | ORAL_TABLET | Freq: Two times a day (BID) | ORAL | 0 refills | Status: AC
  Filled 2024-08-07: qty 14, 7d supply, fill #0

## 2024-08-07 MED ORDER — METRONIDAZOLE 500 MG PO TABS
500.0000 mg | ORAL_TABLET | Freq: Once | ORAL | Status: AC
  Administered 2024-08-07: 500 mg via ORAL
  Filled 2024-08-07: qty 1

## 2024-08-07 NOTE — ED Notes (Signed)
 Swabs taken to Lab by RN.

## 2024-08-07 NOTE — Discharge Instructions (Addendum)
 Thank you for letting us  evaluate you today.  Your labs will return in 24-36 hours.  However we are treating as if they are positive.  I have sent antibiotics to your pharmacy.  Please make sure to take as prescribed and take entire prescription even if you are feeling better.  As for swelling, make sure to take warm baths, apply warm (not hot) compresses to area, take ibuprofen for antiinflammatory properties, stop soap and avoid any other irritation including intercourse to try and decrease swelling  Return to Emergency Department if swelling worsens, drainage is coming from swelling, significant worsening in symptoms but ultimately, think you would benefit from following up with women Center

## 2024-08-07 NOTE — ED Notes (Signed)
Pt ambulatory to room.

## 2024-08-07 NOTE — ED Triage Notes (Signed)
 Patient reports swelling to vulva for several days. Denies abnormal discharge.

## 2024-08-07 NOTE — ED Notes (Signed)
 Pt left before DC paperwork could be given and explained... Pt was AO x4the last time I talked to her about being up for DC... Pt had family in the room.SABRASABRA

## 2024-08-07 NOTE — ED Provider Notes (Signed)
 [place] EMERGENCY DEPARTMENT AT [facility] Provider Note   CSN: [id number] Arrival date & time: 08/07/24  1120     Patient presents with: Groin Swelling   Barbara Gordon is a 33 y.o. female with past medical history of polysubstance abuse, seizure, schizophrenia, hypothyroidism presents Emergency Department for evaluation of swelling around the clitoris that has been present for the past 2 days.  Pain 8/10. Started new soap one week ago. No known trauma.  Has not tried any OTC medications, heat compresses prior to arrival.  Denies urinary symptoms, vaginal symptoms, drainage, fevers  Also interested in STD testing as she recently had a new sexual partner. No vaginal drainage, bleeding, odors.    HPI     Prior to Admission medications   Medication Sig Start Date End Date Taking? Authorizing Provider  doxycycline  (VIBRAMYCIN ) 100 MG capsule Take 1 capsule (100 mg total) by mouth 2 (two) times daily for 7 days. 08/07/24 08/14/24 Yes Minnie Tinnie BRAVO, PA  metroNIDAZOLE  (FLAGYL ) 500 MG tablet Take 1 tablet (500 mg total) by mouth 2 (two) times daily. 08/07/24  Yes Minnie Tinnie BRAVO, PA  cyanocobalamin  1000 MCG tablet Take 1 tablet (1,000 mcg total) by mouth daily. 11/22/23   Ntuen, Tina C, FNP  FLUoxetine  (PROZAC ) 20 MG capsule Take 1 capsule (20 mg total) by mouth daily. 11/22/23   Ntuen, Tina C, FNP  gabapentin  (NEURONTIN ) 100 MG capsule Take 3 capsules (300 mg total) by mouth at bedtime. 11/21/23   Ntuen, Tina C, FNP  hydrOXYzine  (ATARAX ) 25 MG tablet Take 1 tablet (25 mg total) by mouth 3 (three) times daily as needed for anxiety. 11/21/23   Ntuen, Tina C, FNP  lisinopril  (ZESTRIL ) 5 MG tablet Take 1 tablet (5 mg total) by mouth daily. 11/22/23   Ntuen, Tina C, FNP  melatonin 5 MG TABS Take 1 tablet (5 mg total) by mouth at bedtime. 11/21/23   Ntuen, Tina C, FNP  nicotine  (NICODERM CQ  - DOSED IN MG/24 HOURS) 14 mg/24hr patch Place 1 patch (14 mg total) onto the skin daily. 11/22/23    Ntuen, Tina C, FNP  ondansetron  (ZOFRAN -ODT) 4 MG disintegrating tablet Take 1 tablet (4 mg total) by mouth every 6 (six) hours as needed for nausea or vomiting. 11/21/23   Ntuen, Tina C, FNP  paliperidone  (INVEGA  SUSTENNA) 156 MG/ML SUSY injection Inject 1 mL (156 mg total) into the muscle every 28 (twenty-eight) days. 12/18/23   Ntuen, Tina C, FNP  QUEtiapine  (SEROQUEL ) 50 MG tablet Take 1 tablet (50 mg total) by mouth at bedtime as needed (insomnia). 11/21/23   Ntuen, Tina C, FNP  risperiDONE  (RISPERDAL ) 4 MG tablet Take 1 tablet (4 mg total) by mouth at bedtime. 11/21/23   Ntuen, Tina C, FNP  Vitamin D , Ergocalciferol , (DRISDOL ) 1.25 MG (50000 UNIT) CAPS capsule Take 1 capsule (50,000 Units total) by mouth every 7 (seven) days. 11/22/23   Ntuen, Tina C, FNP  vitamin D3 (CHOLECALCIFEROL ) 25 MCG tablet Take 2 tablets (2,000 Units total) by mouth daily. 11/22/23   Ntuen, Tina C, FNP    Allergies: Latex and Tramadol    Review of Systems  Skin:  Positive for color change.    Updated Vital Signs BP 123/68 (BP Location: Right Arm)   Pulse 64   Temp 97.8 F (36.6 C) (Oral)   Resp 16   LMP 07/18/2024 (Approximate)   SpO2 100%   Physical Exam Vitals and nursing note reviewed.  Constitutional:      General: She  is not in acute distress.    Appearance: Normal appearance.  HENT:     Head: Normocephalic and atraumatic.  Eyes:     Conjunctiva/sclera: Conjunctivae normal.  Cardiovascular:     Rate and Rhythm: Normal rate.  Pulmonary:     Effort: Pulmonary effort is normal. No respiratory distress.     Breath sounds: Normal breath sounds.  Genitourinary:    Comments: Swelling around clitoris. No erythema, warmth, drainage, nor fluctuance. TTP White/grey thick discharge in vaginal vault. No CMT. No erythema nor bleeding from cervix. No strawberry cervix Skin:    Coloration: Skin is not jaundiced or pale.  Neurological:     Mental Status: She is alert. Mental status is at baseline.   GU exam  performed with Franne Piano RN  (all labs ordered are listed, but only abnormal results are displayed) Labs Reviewed  WET PREP, GENITAL - Abnormal; Notable for the following components:      Result Value   Trich, Wet Prep PRESENT (*)    Clue Cells Wet Prep HPF POC PRESENT (*)    All other components within normal limits  URINALYSIS, ROUTINE W REFLEX MICROSCOPIC  PREGNANCY, URINE  GC/CHLAMYDIA PROBE AMP ([place]) NOT AT [facility]    EKG: None  Radiology: No results found.   Medications Ordered in the ED  cefTRIAXone (ROCEPHIN) injection 500 mg (has no administration in time range)  doxycycline  (VIBRA -TABS) tablet 100 mg (has no administration in time range)  metroNIDAZOLE  (FLAGYL ) tablet 500 mg (has no administration in time range)  naproxen (NAPROSYN) tablet 500 mg (500 mg Oral Given 08/07/24 1333)                                    Medical Decision Making Amount and/or Complexity of Data Reviewed Labs: ordered.  Risk Prescription drug management.   Patient presents to the ED for concern of STD exposure, swelling in vulvar region, this involves an extensive number of treatment options, and is a complaint that carries with it a high risk of complications and morbidity.  The differential diagnosis includes cellulitis, abscess, Bartholin cyst, swelling, gonorrhea, trichomonas, chlamydia, BV   Co morbidities that complicate the patient evaluation  See HPI   Additional history obtained:  Additional history obtained from Nursing   External records from outside source obtained and reviewed including triage RN note   Lab Tests:  I Ordered, and personally interpreted labs.  The pertinent results include:   Wet prep shows trichomoniasis and clue cells    Medicines ordered and prescription drug management:  I ordered medication including naprosyn  for pain, swelling  Reevaluation of the patient after these medicines showed that the patient improved I have reviewed  the patients home medicines and have made adjustments as needed    Problem List / ED Course:  Screen for STD New partner but no vaginal discharge Will not treat empirically at this time as she does not have any symptoms nor do I see any obvious signs of STD on pelvic exam Wet prep shows trichomonas and clue cells.  Provided dose of Metro in emergency department.  Also provided metronidazole  prescription for treatment Shared decision making is had with patient regarding treating for all STDs as she has tested positive for trichomonas.  She wishes to be prophylactically treated for gonorrhea and chlamydia as well.  I discussed that her testing should come back in next 24-36 hours.  Provided her with doxycycline , Rocephin in Emergency Department as well as prescription for doxycycline  Discussed antibiotic stewardship Discussed refraining from sexual intercourse until she completes antibiotics that she may spread STD.  Also discussed that she should refrain from sexual intercourse with individual who she believes gave her STD until they are treated  Swelling of vulva Clinically not significant for abscess. Is non erythematous with no drainage nor fluctuance Recommend stiz baths, warm compresses, avoiding of irritants/traumatic injury and f/u with GYN Provided GYN follow-up on DC paperwork   Reevaluation:  After the interventions noted above, I reevaluated the patient and found that they have :improved   Social Determinants of Health:  Tobacco abuse-provided cessation counseling No PCP or GYN.  Provided recommendations for both   Dispostion:  After consideration of the diagnostic results and the patients response to treatment, I feel that the patent would benefit from outpatient management with GYN follow-up.   Discussed ED workup, disposition, return to ED precautions with patient who expresses understanding agrees with plan.  All questions answered to their satisfaction.  They are  agreeable to plan.  Discharge instructions provided on paperwork  Final diagnoses:  Screen for STD (sexually transmitted disease)  Swelling of vulva    ED Discharge Orders          Ordered    metroNIDAZOLE  (FLAGYL ) 500 MG tablet  2 times daily        08/07/24 1337    doxycycline  (VIBRAMYCIN ) 100 MG capsule  2 times daily        08/07/24 1337             Minnie Tinnie BRAVO, PA 08/07/24 1341    Freddi Hamilton, MD 08/08/24 [phone]

## 2024-08-08 ENCOUNTER — Other Ambulatory Visit (HOSPITAL_BASED_OUTPATIENT_CLINIC_OR_DEPARTMENT_OTHER): Payer: Self-pay

## 2024-08-08 LAB — GC/CHLAMYDIA PROBE AMP (~~LOC~~) NOT AT ARMC
Chlamydia: NEGATIVE
Comment: NEGATIVE
Comment: NORMAL
Neisseria Gonorrhea: NEGATIVE

## 2024-08-31 ENCOUNTER — Other Ambulatory Visit: Payer: Self-pay | Admitting: Pharmacist

## 2024-08-31 NOTE — Progress Notes (Signed)
 Specialty Pharmacy Ongoing Clinical Assessment Note  Barbara Gordon is a 33 y.o. female who is being followed by the specialty pharmacy service for RxSp Hepatitis C   Patient's specialty medication(s) reviewed today: Glecaprevir -Pibrentasvir  (Mavyret )   Missed doses in the last 4 weeks: 0   Patient/Caregiver did not have any additional questions or concerns.   Therapeutic benefit summary: Patient is achieving benefit   Adverse events/side effects summary: No adverse events/side effects   Patient's therapy is appropriate to: Discontinue    Goals Addressed             This Visit's Progress    COMPLETED: Achieve virologic cure as evidenced by SVR   On track    Patient is initiating therapy. Patient will be evaluated at upcoming provider appointment to assess progress      Maintain optimal adherence to therapy   On track    Patient is initiating therapy. Patient will be evaluated at upcoming provider appointment to assess progress         Follow up: none required -therapy completed  Alan JINNY Geralds Specialty Pharmacist

## 2024-10-12 ENCOUNTER — Telehealth: Payer: Self-pay

## 2024-10-12 NOTE — Telephone Encounter (Signed)
 Copied from CRM #[id number]. Topic: General - Other >> Oct 11, 2024 12:51 PM Rosaria E wrote:  Reason for CRM: pt is requesting a call back regarding disability questions for a child psychotherapist  Best contact: [phone]
